# Patient Record
Sex: Female | Born: 1940 | Race: White | Hispanic: No | Marital: Married | Smoking: Former smoker
Health system: Southern US, Community
[De-identification: ages and names within clinical notes are randomized; demographics above are authoritative.]

## PROBLEM LIST (undated history)

## (undated) DIAGNOSIS — E079 Disorder of thyroid, unspecified: Secondary | ICD-10-CM

## (undated) DIAGNOSIS — M1990 Unspecified osteoarthritis, unspecified site: Secondary | ICD-10-CM

## (undated) DIAGNOSIS — Z800 Family history of malignant neoplasm of digestive organs: Secondary | ICD-10-CM

## (undated) DIAGNOSIS — Z931 Gastrostomy status: Secondary | ICD-10-CM

## (undated) DIAGNOSIS — R3915 Urgency of urination: Secondary | ICD-10-CM

## (undated) DIAGNOSIS — K2270 Barrett's esophagus without dysplasia: Secondary | ICD-10-CM

## (undated) DIAGNOSIS — C801 Malignant (primary) neoplasm, unspecified: Secondary | ICD-10-CM

## (undated) DIAGNOSIS — I10 Essential (primary) hypertension: Secondary | ICD-10-CM

## (undated) DIAGNOSIS — Z932 Ileostomy status: Secondary | ICD-10-CM

## (undated) DIAGNOSIS — N393 Stress incontinence (female) (male): Secondary | ICD-10-CM

## (undated) HISTORY — DX: Stress incontinence (female) (male): N39.3

## (undated) HISTORY — DX: Unspecified osteoarthritis, unspecified site: M19.90

## (undated) HISTORY — DX: Family history of malignant neoplasm of digestive organs: Z80.0

## (undated) HISTORY — DX: Barrett's esophagus without dysplasia: K22.70

## (undated) HISTORY — DX: Malignant (primary) neoplasm, unspecified: C80.1

## (undated) HISTORY — DX: Essential (primary) hypertension: I10

## (undated) HISTORY — DX: Disorder of thyroid, unspecified: E07.9

## (undated) HISTORY — PX: TOTAL KNEE ARTHROPLASTY: SHX125

## (undated) HISTORY — DX: Urgency of urination: R39.15

---
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
## 1997-02-22 DIAGNOSIS — C801 Malignant (primary) neoplasm, unspecified: Secondary | ICD-10-CM

## 1997-02-22 HISTORY — PX: BREAST LUMPECTOMY: SHX2

## 1997-02-22 HISTORY — DX: Malignant (primary) neoplasm, unspecified: C80.1

## 1997-04-24 ENCOUNTER — Ambulatory Visit (HOSPITAL_COMMUNITY): Admission: RE | Admit: 1997-04-24 | Discharge: 1997-04-24 | Payer: Self-pay | Admitting: Obstetrics & Gynecology

## 1997-06-27 ENCOUNTER — Other Ambulatory Visit: Admission: RE | Admit: 1997-06-27 | Discharge: 1997-06-27 | Payer: Self-pay | Admitting: Radiology

## 1997-07-25 ENCOUNTER — Ambulatory Visit (HOSPITAL_COMMUNITY): Admission: RE | Admit: 1997-07-25 | Discharge: 1997-07-25 | Payer: Self-pay | Admitting: *Deleted

## 1997-08-05 ENCOUNTER — Encounter: Admission: RE | Admit: 1997-08-05 | Discharge: 1997-11-03 | Payer: Self-pay | Admitting: Radiation Oncology

## 1997-08-08 ENCOUNTER — Ambulatory Visit (HOSPITAL_COMMUNITY): Admission: RE | Admit: 1997-08-08 | Discharge: 1997-08-08 | Payer: Self-pay | Admitting: *Deleted

## 1997-09-11 ENCOUNTER — Ambulatory Visit (HOSPITAL_COMMUNITY): Admission: RE | Admit: 1997-09-11 | Discharge: 1997-09-11 | Payer: Self-pay | Admitting: *Deleted

## 1997-11-04 ENCOUNTER — Ambulatory Visit (HOSPITAL_COMMUNITY): Admission: RE | Admit: 1997-11-04 | Discharge: 1997-11-04 | Payer: Self-pay | Admitting: *Deleted

## 1997-11-13 ENCOUNTER — Other Ambulatory Visit: Admission: RE | Admit: 1997-11-13 | Discharge: 1997-11-13 | Payer: Self-pay | Admitting: Gynecology

## 1998-02-04 ENCOUNTER — Encounter: Admission: RE | Admit: 1998-02-04 | Discharge: 1998-05-05 | Payer: Self-pay | Admitting: Radiation Oncology

## 1999-11-18 ENCOUNTER — Encounter: Payer: Self-pay | Admitting: Orthopedic Surgery

## 1999-11-26 ENCOUNTER — Inpatient Hospital Stay (HOSPITAL_COMMUNITY): Admission: RE | Admit: 1999-11-26 | Discharge: 1999-12-01 | Payer: Self-pay | Admitting: Orthopedic Surgery

## 2000-03-28 ENCOUNTER — Other Ambulatory Visit: Admission: RE | Admit: 2000-03-28 | Discharge: 2000-03-28 | Payer: Self-pay | Admitting: Gynecology

## 2000-04-08 ENCOUNTER — Encounter (INDEPENDENT_AMBULATORY_CARE_PROVIDER_SITE_OTHER): Payer: Self-pay

## 2000-04-08 ENCOUNTER — Other Ambulatory Visit: Admission: RE | Admit: 2000-04-08 | Discharge: 2000-04-08 | Payer: Self-pay | Admitting: Gynecology

## 2000-09-20 ENCOUNTER — Encounter: Admission: RE | Admit: 2000-09-20 | Discharge: 2000-09-20 | Payer: Self-pay | Admitting: Orthopedic Surgery

## 2000-09-20 ENCOUNTER — Encounter: Payer: Self-pay | Admitting: Orthopedic Surgery

## 2001-01-17 ENCOUNTER — Encounter: Admission: RE | Admit: 2001-01-17 | Discharge: 2001-02-14 | Payer: Self-pay | Admitting: Orthopedic Surgery

## 2001-03-30 ENCOUNTER — Other Ambulatory Visit: Admission: RE | Admit: 2001-03-30 | Discharge: 2001-03-30 | Payer: Self-pay | Admitting: Gynecology

## 2001-04-07 ENCOUNTER — Encounter: Payer: Self-pay | Admitting: Orthopedic Surgery

## 2001-04-17 ENCOUNTER — Inpatient Hospital Stay (HOSPITAL_COMMUNITY): Admission: RE | Admit: 2001-04-17 | Discharge: 2001-04-22 | Payer: Self-pay | Admitting: Orthopedic Surgery

## 2002-06-13 ENCOUNTER — Other Ambulatory Visit: Admission: RE | Admit: 2002-06-13 | Discharge: 2002-06-13 | Payer: Self-pay | Admitting: Gynecology

## 2003-06-24 ENCOUNTER — Other Ambulatory Visit: Admission: RE | Admit: 2003-06-24 | Discharge: 2003-06-24 | Payer: Self-pay | Admitting: Gynecology

## 2004-06-30 ENCOUNTER — Other Ambulatory Visit: Admission: RE | Admit: 2004-06-30 | Discharge: 2004-06-30 | Payer: Self-pay | Admitting: Gynecology

## 2005-08-18 ENCOUNTER — Other Ambulatory Visit: Admission: RE | Admit: 2005-08-18 | Discharge: 2005-08-18 | Payer: Self-pay | Admitting: Gynecology

## 2006-01-24 ENCOUNTER — Ambulatory Visit: Payer: Self-pay | Admitting: Gastroenterology

## 2006-02-07 ENCOUNTER — Ambulatory Visit: Payer: Self-pay | Admitting: Gastroenterology

## 2006-02-07 ENCOUNTER — Encounter (INDEPENDENT_AMBULATORY_CARE_PROVIDER_SITE_OTHER): Payer: Self-pay | Admitting: Specialist

## 2006-03-03 ENCOUNTER — Ambulatory Visit: Payer: Self-pay | Admitting: Gastroenterology

## 2006-11-07 ENCOUNTER — Other Ambulatory Visit: Admission: RE | Admit: 2006-11-07 | Discharge: 2006-11-07 | Payer: Self-pay | Admitting: Gynecology

## 2007-04-06 DIAGNOSIS — K219 Gastro-esophageal reflux disease without esophagitis: Secondary | ICD-10-CM | POA: Insufficient documentation

## 2007-04-06 DIAGNOSIS — I10 Essential (primary) hypertension: Secondary | ICD-10-CM | POA: Diagnosis present

## 2007-04-06 DIAGNOSIS — E0500 Thyrotoxicosis with diffuse goiter without thyrotoxic crisis or storm: Secondary | ICD-10-CM | POA: Insufficient documentation

## 2007-04-06 DIAGNOSIS — E785 Hyperlipidemia, unspecified: Secondary | ICD-10-CM | POA: Insufficient documentation

## 2007-04-06 DIAGNOSIS — F411 Generalized anxiety disorder: Secondary | ICD-10-CM | POA: Insufficient documentation

## 2010-06-23 ENCOUNTER — Encounter (INDEPENDENT_AMBULATORY_CARE_PROVIDER_SITE_OTHER): Payer: Self-pay | Admitting: General Surgery

## 2010-06-26 ENCOUNTER — Other Ambulatory Visit: Payer: Self-pay | Admitting: Family Medicine

## 2010-06-26 DIAGNOSIS — M542 Cervicalgia: Secondary | ICD-10-CM

## 2010-07-01 ENCOUNTER — Ambulatory Visit
Admission: RE | Admit: 2010-07-01 | Discharge: 2010-07-01 | Disposition: A | Discharge status: Home / Self Care | Payer: MEDICARE | Source: Ambulatory Visit | Attending: Family Medicine | Admitting: Family Medicine

## 2010-07-01 DIAGNOSIS — M542 Cervicalgia: Secondary | ICD-10-CM

## 2010-07-10 NOTE — Op Note (Signed)
[facility]  Patient:    Nancy Harrison, Nancy Harrison Visit Number: [id number] MRN: [id number]          Service Type: SUR Location: 4W [id number] 01 Attending Physician:  Marlowe Kays Page Dictated by:   Illene Labrador. Aplington, M.D. Proc. Date: 04/17/01 Admit Date:  04/17/2001                             Operative Report  PREOPERATIVE DIAGNOSIS:  Advanced osteoarthritis, right knee.  POSTOPERATIVE DIAGNOSIS:  Advanced osteoarthritis, right knee.  OPERATION PERFORMED:  Osteonics total knee replacement, right using the Scorpio cruciate retaining modality.  SURGEON:  Illene Labrador. Aplington, M.D.  ASSISTANT:  Philips J. Montez Morita, M.D.  ANESTHESIA:  Spinal.  PATHOLOGY AND JUSTIFICATION FOR PROCEDURE:  She had a successful total knee replacement on the left. She had severe medial compartment arthritis in the right knee with a very slight flexion contracture.  DESCRIPTION OF PROCEDURE:  Prophylactic antibiotics, satisfactory spinal anesthesia, Foley catheter inserted, pneumatic tourniquet, lateral hip positioner and sure foot. The right leg was prepped with Duraprep from tourniquet to ankle, draped in a sterile field. A vertical midline incision down to the patellar mechanism with median parapatellar incisions to open the joint. The patella was everted and the knee flexed. Osteophytes were removed from around the femur. The anterior cruciate was sacrificed and the anterior portions of the menisci were resected. I then made a drill hole in the distal femur 5/16 inch followed by the canal finder and then the aligning rod set for a distal femoral resection of 10 mm at 5 degrees for the right knee. The 10 mm bone was resected and we then sized the distal femur using the sizing jig at size 5 which is what we had used in the left knee. Scribe holes were placed on the distal femur and I then used the distal femoral cutting jig to make the dorsal and posterior cuts and then  posterior and anterior chamferings. I then went to the tibia where the remnants of menisci were removed and a leveling cut was made. The proximal tibia was sized at #5 and using the base plate the initial drill hole into the tibia was made followed by the step cut drill and the intermedullary rod. I then placed intermedullary rod in the tibia and then used the 5 degree posterior recessed cutting jig with a 4 mm cut off the recessed medial tibial plateau. Then my initial cuts were made and I then placed the lamina spreader and removed residual bone and soft tissue from the posterior femoral condyles. I then used the jig for creating the patellar groove. Following this, we placed a trial femoral component which fit intimately. Then we went through a trial reduction using a 10 mm spacer and this seemed to be the ideal size with minimal to no recurvatum and good medial and lateral stability. The knee in full extension, I used the external aligning rod splitting the bimalleolar distance to mark the scribe lines on the anterior tibial plateau using the tibial base plate. While the knee was extension, I then used the 10 mm recessed cutting jig to make a 10 mm recessed cut 26 mm and then placed the three hole drilling device and made the three fixation holes for the patella. The trial patella was then placed and excess bone and soft tissue around the perimeter was removed so that the patella was trialed to the surrounding tissue.  I then flexed the knee and placed the tibial base plate based on the previous fixation scribe lines and reamed with the tripod apparatus up to a 5 cemented. The knee was then water picked while the methyl methacrylate was being mixed. I then used a glue gun and individually glued in the three components starting first with the tibia with the base plate being tightly impacted and excess methyl methacrylate being removed. The same was done with the femur and then with the 10  mm spacer we held the knee in full extension while the patella was glued in using patella holding clamp. When the methyl methacrylate had hardened, we checked the knee and removed small bits of methacrylate until all the components appeared to be free of any debris or overlapping methacrylate. Gelfoam was placed in the distal femoral hole. The 10 mm spacer seemed to be the ideal fit so we went ahead and after irrigating the wound well making sure there is no methyl methacrylate in the tibial tray, we went ahead and placed the final 10 mm spacer. The knee was reduced and found to be nice and stable. A Hemovac was placed, tourniquet was released, no unusual bleeding. Closure was then performed with interrupted #1 Vicryl in two layers in the quadriceps and in one layer distally in the synovium, one layer of #1 Vicryl in the capsule and a combination of #0 and 2-0 Vicryl in the subcutaneous with staples in the skin. Betadine Adaptic dry sterile dressing were applied. She tolerated the procedure well and was taken to the recovery room in satisfactory condition with no known complications. Estimated blood loss was approximately 200 cc, no blood replacement. Dictated by:   Illene Labrador. Aplington, M.D. Attending Physician:  Joaquin Courts DD:  04/17/01 TD:  04/17/01 Job: [id number]

## 2010-07-10 NOTE — Op Note (Signed)
[facility]  Patient:    Nancy Harrison, Nancy Harrison                   MRN: [id number] Proc. Date: 11/26/99 Adm. Date:  [id number] Attending:  Marlowe Kays Page                           Operative Report  PREOPERATIVE DIAGNOSES:  Advanced osteoarthritis, left knee.  POSTOPERATIVE DIAGNOSES:  Advanced osteoarthritis, left knee.  OPERATION PERFORMED:  Osteonic total knee replacement, left.  SURGEON:  Dr. Simonne Come.  ASSISTANT:  Dr. Worthy Rancher.  ANESTHESIA:  Spinal.  PATHOLOGY AND JUSTIFICATION FOR PROCEDURE:  She had bone on bone abutment medially and advanced patellar chondromalacia as well. All other nonsurgical methods had failed.  DESCRIPTION OF PROCEDURE:  Prophylactic antibiotics, satisfactory spinal anesthesia, Foley catheter inserted, pneumatic tourniquet, ______ foot stabilizer and folded sheets beneath the left buttock. The left leg was prepped from tourniquet to ankle with duraprep, draped in a sterile field, Ioban employed. A vertical midline incision down to the patellar mechanism with median parapatellar incision to open the joint. Osteophytes around the femur and the patella were removed. Her patella was very small but on sizing, a 26 mm the smallest available did seem to be feasible and was ultimately used. The anterior cruciate ligament was sacrificed, the posterior cruciate ligament saved. The two menisci were freed up and resected in subtotal fashion. The pes anserinus and the medial collateral ligament were undermined from the medial tibia. A 5/16 inch drill hole was placed in the distal femur, followed by a canal finder and the axis liners at 5 degrees cut to the distal femur on the left knee. Because she had a small flexion contracture, I elected to take 12 mm off the distal femur and the 12 mm cut was made. I then placed the sizing jig and a 5 was the appropriate size. The scribe lines were placed for placing the distal femoral  cutting jig which was then applied and anterior and posterior cuts and posterior and anterior chamferings were then made. I then returned to the tibia where the remnants of the posterior menisci were removed and a leveling cut made on the tibia which was incised at a 5. The tibial trial tray was placed and a centering hole made, enlarged followed by the canal finder. I then placed the intermedullary rod with the proximal tibial cutting guide and aligned the cutting jig for a 5 degree posterior cut splitting the bimalleolar distance with the external rod. Initially I planned to take a 4 mm cut off using the medial tibial plateau but it was clear that this was not sufficient and ultimately took additional 2 for a total of a 6 mm cut off the proximal tibia. After this cut had been completed, we then elevated the femur up with a bone hook and removed some remnants of bone and soft tissue posteriorly. I then placed the patellar groove guide and created a groove for the patella. We then went through a trial reduction and anatomic fit on the femur and a 15 tray seemed to be the best fit. With the knee in extension, we went ahead and used the 10 mm recessed cutting jig for the patella followed by drilling 3 holes. The trial patella was then placed and excess bone around the perimeter was removed with the rongeur. We went through the trial having used the external alignment guide once again splitting  the bimalleolar distance and marking scribe lines on the tibia, we used the 5 tibial tray fixing it using the scribe lines and then placed the tripod apparatus for creating the keel for the tibial prosthesis. Reaming was made up to a 5 cemented. We then water picked the entire knee while the methyl methacrylate was being mixed. We then individually glued in the components starting first with the tibia impacting the tibial component and removing excess methyl methacrylate, the femur and then the patella.  The knee was held in extension while the glue hardened on all components. Excess methyl methacrylate was then trimmed up from around the components and trial reduction was again confirmed with the 15 mm spacer was the ideal fit. The final tray was placed then it taken through a full range of motion. Minimal patellar lateral release was performed with patella gliding nicely in the midline. The tourniquet was released and minimal bleeding coagulated. A Hemovac was placed and the wound closed with interrupted #1 Vicryl in 2 layers in the quadriceps and distally in the synovium and capsule. The subcutaneous tissue was closed with a combination of #1-0 and 2-0 Vicryl, the skin with staples. Betadine Adaptic dry sterile dressing were applied. The patient tolerated the procedure well and was taken to the recovery room in satisfactory condition with no known complications. Estimated blood loss was approximately 200 cc, no blood replacement. DD:  11/26/99 TD:  11/26/99 Job: [id number]

## 2010-07-10 NOTE — Discharge Summary (Signed)
[facility]  Patient:    Nancy Harrison, Nancy Harrison Visit Number: [id number] MRN: [id number]          Service Type: SUR Location: 4W 0470 01 Attending Physician:  Marlowe Kays Page Dictated by:   Sammuel Cooper. Mahar, P.A. Admit Date:  04/17/2001 Discharge Date: 04/22/2001                             Discharge Summary  DATE OF BIRTH:  1940-07-10.  ADMITTING DIAGNOSES:  1. End-stage osteoarthritis of the right knee.  2. Hypercholesterolemia.  3. Hypertension.  4. Gastroesophageal reflux disease.  5. Anxiety.  6. History of breast cancer.  7. Allergic sinusitis.  DISCHARGE DIAGNOSES:  1. Status post right total knee arthroplasty.  2. Postoperative hemorrhagic anemia that was mild and did not require a     blood transfusion.  3. Postoperative fever that resolved prior to discharge.  4. Hypokalemia.  5. Mild hyponatremia that resolved prior to discharge.  6. Hypercholesterolemia.  7. Hypertension.  8. Gastroesophageal reflux disease.  9. Anxiety. 10. History of breast cancer. 11. Allergic sinusitis.  PROCEDURE:  Right total knee arthroplasty, which was done by Illene Labrador. Aplington, M.D., with the assistance of Philips J. Montez Morita, M.D.  Anesthesia used was spinal.  CONSULTATIONS:  None.  HISTORY OF PRESENT ILLNESS:  The patient is a 70 year old female who has been followed by [facility] for some time with pain in bilateral knees.  She has undergone arthroscopic debridement of the right knee as well as she has tried other conservative measures without any success.  The risks and benefits of total knee replacement were discussed with the patient by Dr. Marlowe Kays.  She indicated an understanding and did wish to proceed.  LABORATORY DATA:  On April 06, 2001, CBC was within normal limits. Postoperatively, H&H was monitored, did reach a low of 11.4 and 33.2, respectively, on April 18, 2001.  She had gradual increase up to 12.0  and 34.4 on April 20, 2001.  CBC on April 20, 2001, was within normal limits with the exception of slightly decreased hemoglobin and hematocrit.  PT, INR, and PTT on April 06, 2001, within normal limits.  PT and INR were monitored postoperatively while on Coumadin, gradually increased up to a therapeutic range, reaching therapeutic range on April 20, 2001, of 27.6 and 3.5, respectively.  Prior to discharge on April 22, 2001, these were 21.5 and 2.3, respectively.  A complete metabolic panel from April 06, 2001, was within normal limits.  Basic metabolic panel from February 26 and February 27, on February 26 potassium was slightly decreased to 3.1 and increased up to 3.3 on February 27.  Sodium on February 27 was 133, later that day was repeated and increased up to 135.  Glucose was slightly elevated at 124 and 141 on February 26 and February 27.  UA from February 13 was negative with the exception of large leukocyte esterase and epithelials, few.  On February 27, UA was within normal limits with the exception of a small leukocyte esterase.  On April 17, 2001, blood typing was A, Rh type positive, antibody screen negative. Urine cultures from February 27 revealed a colony count of greater than or equal to 100,000.  Culture revealed multiple species present with no uropathogens isolated, probable contamination.  EKG from February 14 showed normal sinus rhythm, ST and T-wave abnormality, consider anterior ischemia, abnormal EKG, no significant changes since last tracing,  read by Rollene Rotunda, M.D.   X-rays from February 14 of the chest revealed no active disease.  Right knee showed moderate advanced degenerative changes.  HOSPITAL COURSE:  The patient was taken to the operating suite on April 17, 2001, for the above-listed procedure.  She tolerated the procedure very well without any intraoperative complications.  Postoperatively appropriate analgesics were utilized  initially using a combination of IV as well as p.o. analgesics transitioned over postop day 1 to 2 to strictly p.o. analgesics. Pain control remained adequate.  Neurovascular checks were started postoperatively and remained intact throughout her hospital stay.  Appropriate antibiotics were started postoperatively for 48 hours.  IV antibiotics were discontinued at that point.  She was transitioned out to p.o. Keflex, and this continued through discharge secondary to some serous drainage from the incision.  Diet was advanced as tolerated without any difficulty.  The incentive spirometer was utilized to decrease the risk of pulmonary complications.  She did very well without any complications.  PT and OT were consulted to work with the patient on progressive ambulation as well as total knee protocol.  She progressed well with them, increasing up to ambulating in excess of 100 feet without any difficulty or problems independently.  On postoperative day 1 the Hemovac drain that was placed intraoperatively was discontinued without any difficulty.  Postoperative day 2 the surgical dressing was taken and revealed a good-looking incision without any signs or symptoms of infection, did have some serous drainage.  The patient started running a low-grade temperature on postoperative day 2.  It was felt to be secondary to atelectasis.  We encouraged her to increase her use of the bedside incentive spirometer.  The low-grade temperature did continue through postop day 3 and 4; however, prior to discharge it did resolve.  UA was negative as well as white count was within normal limits throughout.  Daily dressing changes were done to the operative site, and the incision continued to look very good without any problems throughout.  By April 22, 2001, the patient had been doing very well with physical therapy and met all orthopedic goals.  She was medically stable and ready for discharge.  Her fever  had resolved.  Incision looked very good without any signs or symptoms of infection, very minimal serous drainage, no purulence present.  She was  neurovascularly intact.  Motor and sensory was intact to the operative extremity.  The patient was started on Coumadin.  PT and INR were monitored postoperatively while on Coumadin and managed per pharmacy.  DISCHARGE INSTRUCTIONS:  Activity:  Continue weightbearing as tolerated. Continue on total knee protocol and precautions.  Dressing changes should be done daily.  The patient may shower when the drainage from the incision stops. Discharge diet:  Resume a preoperative diet as tolerated.  FOLLOW-UP:  Follow up with Dr. Fayrene Fearing Aplington two weeks postoperatively. Call for an appointment.  DISCHARGE MEDICATIONS: 1. Coumadin 3 mg tablets one p.o. q.d., will be managed by [facility]. 2. Robaxin, Percocet, and Keflex x5 days.  DISCHARGE DISPOSITION:  Being discharged to her home with home health services per [place].  CONDITION ON DISCHARGE:  Stable and improved. Dictated by:   Sammuel Cooper. Mahar, P.A. Attending Physician:  Joaquin Courts DD:  05/17/01 TD:  05/18/01 Job: [id number]

## 2010-07-10 NOTE — Assessment & Plan Note (Signed)
[facility]                         GASTROENTEROLOGY OFFICE NOTE   Nancy Harrison, Nancy Harrison                   MRN:          [id number]  DATE:03/03/2006                            DOB:          [birth year]    Nancy Harrison comes for followup of her endoscopy and colonoscopy which  were done on February 07, 2006.  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  She really denies any chronic reflux symptoms except for p.r.n.  indigestion for which she uses Pepcid AC.   Her colonoscopy showed diverticulosis coli and I have explained the high-  fiber-diet management of diverticulosis.  She does have a family history  of colon cancer in mother at age 66.  We will put her on our colonoscopy  screening protocol for every 5 years.   She otherwise is followed by Dr. Idell Pickles, is on multiple medications  which I have asked her to continue.  We will see her on a p.r.n. basis  as needed.     Vania Rea. Jarold Motto, MD, Caleen Essex, FAGA  Electronically Signed    DRP/MedQ  DD: 03/03/2006  DT: 03/03/2006  Job #: [id number]   cc:   Dellis Anes. Idell Pickles, M.D.  Leatha Gilding. Mezer, M.D.

## 2010-07-10 NOTE — H&P (Signed)
[facility]  Patient:    Nancy Harrison, Nancy Harrison Visit Number: [id number] MRN: [id number]          Service Type: Attending:  Illene Labrador. Aplington, M.D. Dictated by:   Ralene Bathe, P.A. Adm. Date:  04/11/01                           History and Physical  CHIEF COMPLAINT:  Right knee pain.  HISTORY OF PRESENT ILLNESS:  Nancy Harrison is a 70 year old female who has been followed by [place] for some time with pain in bilateral knees.  She has undergone arthroscopy of her right knee as well as viscus supplementation to her left knee and subsequently went on to have a total knee replacement on the left. She has done well with the left knee and wishes to proceed with the right knee now.  X-rays have shown bone-on-bone deformity.  She continues to have significant pain, and her right knee is much more symptomatic.  At this time total knee arthroplasty is indicated.  The risks and benefits were discussed with the patient.  At this time due to her pain and deformity and disability from this, she wishes to proceed.  PAST MEDICAL HISTORY: 1. Hypercholesterolemia. 2. Hypertension. 3. GERD. 4. Anxiety. 5. History of breast cancer. 6. Allergic sinusitis.  PAST SURGICAL HISTORY: 1. Left total knee arthroplasty in October 2001. 2. Right lumpectomy. 3. Port-A-Cath x2. 4. Right knee arthroscopy. 5. Tonsillectomy and adenoidectomy.  MEDICATIONS: 1. Alprazolam 0.25 mg 1/2 tablet t.i.d. 2. Bellergal-S 1/2 tablet b.i.d. 3. Lipitor 40 mg q.h.s. 4. Naproxen 500 mg b.i.d., which she is stopping. 5. Niacin 500 mg b.i.d. 6. Zestril 10 mg q.d.  7. Ziac 10 mg q.d.  8. Evista 60 mg q.d.  9. Aspirin 81 mg q.d., which she is stopping. 10. Allegra-D b.i.d. 11. Prilosec 10 mg q.a.m.  ALLERGIES:  SPORANOX and THIAZIDE.  These cause itching and rash.  SOCIAL HISTORY:  She is married.  Lives in a one-story home with six steps into the usual entrance.  She went home after her knee  replacement last time without the need for inpatient rehabilitation.  She did not donate autologous blood.  FAMILY HISTORY:  Mother with colon cancer.  Grandmother with hypertension and heart disease.  REVIEW OF SYSTEMS:  The patient denies any recent fevers, chills, night sweats.  No bleeding tendencies.  CNS:  No headaches, seizures, paralysis. RESPIRATORY:  No shortness of breath, productive cough, hemoptysis. CARDIOVASCULAR:  No chest pain, angina, orthopnea.  GASTROINTESTINAL:  No nausea, vomiting, constipation, melena, bloody stools.  GENITOURINARY:  No dysuria, hematuria.  MUSCULOSKELETAL:  As pertinent to present illness.  PHYSICAL EXAMINATION:  GENERAL:  The patient is well-developed, well-nourished 70 year old female.  VITAL SIGNS:  Blood pressure 124/82.  HEENT:  Normocephalic.  Extraocular motions intact.  NECK:  Supple.  No lymphadenopathy.  No carotid bruits appreciated on exam.  CHEST:  Clear to auscultation bilaterally.  No rales, no rhonchi.  HEART:  Regular rate and rhythm.  No murmurs, gallops, rubs, heaves, or thrills.  ABDOMEN:  Positive bowel sounds.  Soft and nontender.  EXTREMITIES:  Positive distal pulses.  Neurovascularly intact to bilateral lower extremities.  No pitting edema is noted.  She has painful range of motion and pain along both joint lines and crepitus on range of motion on the right knee.  IMPRESSION: 1. End-stage osteoarthritis right knee. 2. Hypercholesterolemia. 3. Hypertension. 4. Gastroesophageal reflux disease. 5. Anxiety. 6.  History of breast cancer. 7. Allergic sinusitis.  PLAN:  The plan will be for right total knee arthroplasty.  Will use home health PT postoperatively. Dictated by:   Ralene Bathe, P.A. Attending:  Illene Labrador. Aplington, M.D. DD:  04/09/01 TD:  04/09/01 Job: [id number]

## 2010-07-10 NOTE — H&P (Signed)
[facility]  Patient:    Nancy Harrison, Nancy Harrison                         MRN: [id number] Adm. Date:  11/26/99 Attending:  Fayrene Fearing P. Aplington, M.D. Dictator:   Alexzandrew L. Julien Girt, P.A.-C. CC:         Gordy Savers, M.D. [facility]   History and Physical  DATE OF OFFICE VISIT/HISTORY AND PHYSICAL:  November 18, 1999  CHIEF COMPLAINT:  Left knee pain.  HISTORY OF PRESENT ILLNESS:  The patient is a 70 year old female who has been seen by Dr. Simonne Come for long standing history of left knee pain and arthritis.  She has been seen and treated conservatively for left knee pain for some time now.  The pain has been progressive to the point where she is now having pain at rest and also during inclement weather.  She has been taking Naprosyn in the past.  The knee pain has started to interfere with her daily activities and she is seeking surgical intervention at this time.  She is seen and evaluated.  X-rays show bone-on-bone abutment in the medial compartment of the left knee with about only 1 mm clearance on the right medial compartment.  Surgical benefits and risks were discussed with the patient and she has elected to proceed with surgery.  ALLERGIES:  SPORANOX causes a rash or itching, THIAZIDE causes rash and itching.  CURRENT MEDICATIONS:  1. Allegra D b.i.d. p.r.n.  2. Alprazolam 0.25 mg 1/2 tablet t.i.d. p.r.n.  3. Bellergal-S (spastrin) 1/2 to 1 tablet b.i.d.  4. Lipitor 40 mg p.o. q.d. in the evenings.  5. Naprosyn 500 mg b.i.d. (stop prior to surgery).  6. Niacin 500 mg b.i.d.  7. Prilosec 10 mg one p.o. q.a.m.  8. Zestril 5 mg one p.o. q.p.m.  9. Ziac 10/6.25 mg p.o. q.p.m. 10. Evista 60 mg one p.o. q.p.m. 11. Aspirin 81 mg daily (stop prior to surgery). 12. Erythromycin topical 2% solution apply to facial area daily. Current cancer medications include: 13. Cyclophosphamide (Cytoxan) every three weeks. 14. Methotrexate every three weeks. 15.  Fluorouracil (5-FU) every three weeks. 16. Vitamin C 500 mg p.o. q.d. 17. Selenium 50 mcg p.o. q.d. 18. Calcium 500+D p.o. q.d. 19. Centrum multivitamin p.o. q.d. 20. Vitamin E 400 i.u. p.o. q.d.  PAST MEDICAL HISTORY:   Hypertension, hypercholesterolemia, osteoarthritis, remote history of kidney infection, seasonal allergies, colon polyps, anxiety, breast CA and has recently undergone chemotherapy and radiation.  PAST SURGICAL HISTORY:  Tonsillectomy in 1959, left knee arthroscopy in 1998, right lumpectomy in 1999, right resection lumpectomy in 1999, several colonoscopies with polypectomy.  She also had a Port-A-Cath installed in 1999 and reinstalled again later that year, this was all for chemotherapy treatment.  SOCIAL HISTORY:  She is married and lives in a one-story home with five to six steps entering in the home.  She has approximately two to four drinks of alcohol daily.  Two pack per day smoking for 17 years, however, she quit 23 years ago.  She has three children.  FAMILY HISTORY:  Mother deceased age 34 with history of hypertension and cancer.  Fathers history is unknown.  REVIEW OF SYSTEMS:  GENERAL:  No fevers, chills or night sweats.  NEUROLOGIC: No seizures, syncope or paralysis.  RESPIRATORY:  No shortness of breath, productive cough or hemoptysis.  CARDIOVASCULAR:  No chest pain, angina or orthopnea.  GI:  No nausea, vomiting, diarrhea or constipation.  GU:  No dysuria, hematuria or discharge.  MUSCULOSKELETAL:  Pertinent to that of the left knee as found in the history of present illness.  PHYSICAL EXAMINATION:  VITAL SIGNS:  Pulse 86, respirations 12, blood pressure 112/72.  GENERAL:  The patient is a 70 year old white female, well-developed, well-nourished, slightly overweight.  She is alert, oriented and cooperative.  HEENT:  Normocephalic, atraumatic.  Pupils are round and reactive.  Oropharynx is clear.  The patient is noted to wear glasses.  NECK:   Supple.  CHEST:  Clear to auscultation.  HEART:  Regular rate and rhythm.  No murmurs.  S1 and S2 noted.  ABDOMEN:  Round, soft, abdomen nontender, bowel sounds are present.  RECTAL/BREASTS/GENITALIA:  Not done, not pertinent to present illness.  EXTREMITIES:  The patient does walk with somewhat of an antalgic gait.  Motor function is intact throughout the left lower extremity with sensation intact. Crepitus noted on passive range of motion.  IMPRESSION: 1. Osteoarthritis, left knee. 2. Hypertension. 3. Hypercholesterolemia. 4. Seasonal allergies. 5. Anxiety. 6. History of breast cancer.  PLAN:  The patient will be admitted to [facility] for left total knee replacement arthroplasty.  The patient has been preoperatively by Dr. Eleonore Chiquito who has cleared her for the up and coming surgery.  The surgery will be performed by Dr. Marlowe Kays.  The patient has donated two units of autologous blood in preparation for the surgery. DD:  12/03/99 TD:  12/03/99 Job: [id number]

## 2011-01-01 ENCOUNTER — Ambulatory Visit (INDEPENDENT_AMBULATORY_CARE_PROVIDER_SITE_OTHER): Payer: Medicare Other | Admitting: General Surgery

## 2011-01-01 ENCOUNTER — Encounter (INDEPENDENT_AMBULATORY_CARE_PROVIDER_SITE_OTHER): Payer: Self-pay | Admitting: General Surgery

## 2011-01-01 VITALS — BP 118/70 | HR 72 | Temp 97.8°F | Resp 12 | Ht 59.0 in | Wt 158.0 lb

## 2011-01-01 DIAGNOSIS — C50919 Malignant neoplasm of unspecified site of unspecified female breast: Secondary | ICD-10-CM | POA: Insufficient documentation

## 2011-01-01 HISTORY — DX: Malignant neoplasm of unspecified site of unspecified female breast: C50.919

## 2011-01-01 NOTE — Progress Notes (Signed)
Subjective:     Patient ID: Nancy Harrison, female   DOB: [birth year], 70 y.o.   MRN: [id number]  HPI The patient is a 70 year old white female who is now 13 years out from a right breast lumpectomy and negative sentinel node biopsy. She was treated with chemotherapy and radiation. She has been doing well. She has had no illnesses in the last year. She denies any breast pain or discharge from her nipple. She is due for her yearly mammogram this month with Dr. Isaiah Serge. She does note 30 pounds of weight loss in the last year. She has been dieting and exercising to try to do this.  Review of Systems  Constitutional: Negative.   HENT: Negative.   Eyes: Negative.   Respiratory: Negative.   Cardiovascular: Negative.   Gastrointestinal: Negative.   Genitourinary: Negative.   Musculoskeletal: Negative.   Skin: Negative.   Neurological: Negative.   Hematological: Negative.   Psychiatric/Behavioral: Negative.        Objective:   Physical Exam  Constitutional: She is oriented to person, place, and time. She appears well-developed and well-nourished.  HENT:  Head: Normocephalic and atraumatic.  Eyes: Conjunctivae and EOM are normal. Pupils are equal, round, and reactive to light.  Neck: Normal range of motion. Neck supple.  Cardiovascular: Normal rate and regular rhythm.   Pulmonary/Chest: Effort normal and breath sounds normal.       She has a little nodularity at the old surgical site in the upper outer right breast. Otherwise no palpable mass in either breast. No axillary supraclavicular or cervical lymphadenopathy  Abdominal: Soft. Bowel sounds are normal. She exhibits no mass. There is no tenderness.  Musculoskeletal: Normal range of motion.  Neurological: She is alert and oriented to person, place, and time.  Skin: Skin is warm and dry.  Psychiatric: She has a normal mood and affect. Her behavior is normal.       Assessment:     13 years status post right lumpectomy and negative  sentinel node biopsy    Plan:     At this point she will continue to do regular self exams. She is due for her next mammogram this month. We will plan to see her back in one year unless she needs [place] sooner.

## 2011-01-01 NOTE — Patient Instructions (Signed)
Continue regular self exams  

## 2011-02-25 ENCOUNTER — Encounter (INDEPENDENT_AMBULATORY_CARE_PROVIDER_SITE_OTHER): Payer: Self-pay | Admitting: General Surgery

## 2011-03-05 ENCOUNTER — Other Ambulatory Visit: Payer: Self-pay | Admitting: Neurosurgery

## 2011-03-05 DIAGNOSIS — M25512 Pain in left shoulder: Secondary | ICD-10-CM

## 2011-03-10 ENCOUNTER — Ambulatory Visit
Admission: RE | Admit: 2011-03-10 | Discharge: 2011-03-10 | Disposition: A | Discharge status: Home / Self Care | Payer: Self-pay | Source: Ambulatory Visit | Attending: Neurosurgery | Admitting: Neurosurgery

## 2011-03-10 DIAGNOSIS — M25512 Pain in left shoulder: Secondary | ICD-10-CM

## 2011-03-16 ENCOUNTER — Other Ambulatory Visit: Payer: Self-pay | Admitting: Family Medicine

## 2011-03-16 ENCOUNTER — Other Ambulatory Visit (HOSPITAL_COMMUNITY)
Admission: RE | Admit: 2011-03-16 | Discharge: 2011-03-16 | Disposition: A | Discharge status: Home / Self Care | Payer: Medicare Other | Source: Ambulatory Visit | Attending: Family Medicine | Admitting: Family Medicine

## 2011-03-16 DIAGNOSIS — Z1159 Encounter for screening for other viral diseases: Secondary | ICD-10-CM | POA: Insufficient documentation

## 2011-03-16 DIAGNOSIS — Z124 Encounter for screening for malignant neoplasm of cervix: Secondary | ICD-10-CM | POA: Insufficient documentation

## 2011-03-24 ENCOUNTER — Other Ambulatory Visit: Payer: Self-pay | Admitting: Oncology

## 2011-03-24 DIAGNOSIS — D649 Anemia, unspecified: Secondary | ICD-10-CM

## 2011-03-25 ENCOUNTER — Telehealth: Payer: Self-pay | Admitting: Oncology

## 2011-03-25 NOTE — Telephone Encounter (Signed)
lmonvm  With the pt advising her of the new pt appt  On 04/09/2011 and for her to call back to confirm.

## 2011-03-27 ENCOUNTER — Telehealth: Payer: Self-pay | Admitting: Oncology

## 2011-03-27 NOTE — Telephone Encounter (Signed)
S/w the pt and she is aware of her feb 2013 new pt appt.

## 2011-04-07 ENCOUNTER — Telehealth: Payer: Self-pay | Admitting: Oncology

## 2011-04-07 NOTE — Telephone Encounter (Signed)
Referred by Dr. Catha Gosselin Dx- Mylodyplastic Disorder

## 2011-04-09 ENCOUNTER — Ambulatory Visit (HOSPITAL_BASED_OUTPATIENT_CLINIC_OR_DEPARTMENT_OTHER): Payer: Medicare Other | Admitting: Oncology

## 2011-04-09 ENCOUNTER — Other Ambulatory Visit: Payer: Self-pay | Admitting: Oncology

## 2011-04-09 ENCOUNTER — Other Ambulatory Visit (HOSPITAL_BASED_OUTPATIENT_CLINIC_OR_DEPARTMENT_OTHER): Payer: Medicare Other | Admitting: Lab

## 2011-04-09 ENCOUNTER — Ambulatory Visit: Payer: Medicare Other

## 2011-04-09 VITALS — BP 119/76 | HR 85 | Temp 98.3°F | Ht 58.5 in | Wt 148.6 lb

## 2011-04-09 DIAGNOSIS — C50919 Malignant neoplasm of unspecified site of unspecified female breast: Secondary | ICD-10-CM

## 2011-04-09 DIAGNOSIS — D72819 Decreased white blood cell count, unspecified: Secondary | ICD-10-CM

## 2011-04-09 DIAGNOSIS — Z853 Personal history of malignant neoplasm of breast: Secondary | ICD-10-CM

## 2011-04-09 DIAGNOSIS — D649 Anemia, unspecified: Secondary | ICD-10-CM

## 2011-04-09 LAB — COMPREHENSIVE METABOLIC PANEL
ALT: 19 U/L (ref 0–35)
BUN: 10 mg/dL (ref 6–23)
CO2: 28 mEq/L (ref 19–32)
Calcium: 9.9 mg/dL (ref 8.4–10.5)
Chloride: 99 mEq/L (ref 96–112)
Creatinine, Ser: 0.61 mg/dL (ref 0.50–1.10)

## 2011-04-09 LAB — CBC & DIFF AND RETIC
BASO%: 0.8 % (ref 0.0–2.0)
EOS%: 2.9 % (ref 0.0–7.0)
LYMPH%: 36.3 % (ref 14.0–49.7)
MCH: 35.6 pg — ABNORMAL HIGH (ref 25.1–34.0)
MCHC: 34.2 g/dL (ref 31.5–36.0)
MONO#: 0.4 10*3/uL (ref 0.1–0.9)
Platelets: 182 10*3/uL (ref 145–400)
RBC: 3.99 10*6/uL (ref 3.70–5.45)
Retic %: 1.48 % (ref 0.70–2.10)
WBC: 3.8 10*3/uL — ABNORMAL LOW (ref 3.9–10.3)

## 2011-04-09 LAB — VITAMIN B12: Vitamin B-12: 480 pg/mL (ref 211–911)

## 2011-04-09 LAB — CHCC SMEAR

## 2011-04-09 NOTE — Progress Notes (Signed)
ID: Nancy Harrison   DOB: 04-15-1940  MR#: [id number]  NWG#:[id number]  HISTORY OF PRESENT ILLNESS: The patient is a 71 year old [place] woman I saw remotely for a history of breast cancer. (Last visit here was in 2004). More recently Dr. Clarene Harrison her primary care physician has become concerned because her white cell count has been slightly low of on repeat testing. He also noted a slight rise in her MCV. Given that she had chemotherapy in the past, he was concerned that she might be developing myelodysplastic disorder, and referred the patient for further evaluation.  Nancy Harrison breast cancer dates back to June of 1999, when she underwent right lumpectomy and sentinel lymph node biopsy for a T1CN0, stage IA invasive ductal carcinoma, estrogen and progesterone receptor negative, HER-2 negative, treated with cyclophosphamide/methotrexate/fluorouracil x8, followed by radiation.    PAST MEDICAL HISTORY: Past Medical History  Diagnosis Date  . Arthritis   . Cancer     rt breast  . Hyperlipidemia   . Hypertension    hypothyroidism  PAST SURGICAL HISTORY: Past Surgical History  Procedure Date  . Total knee arthroplasty 1999 & 2001  . Breast lumpectomy 1999  . Tonsillectomy 1957    FAMILY HISTORY Family History  Problem Relation Age of Onset  . Cancer Mother     COLON  . Cancer Sister     breast  . Cancer Maternal Aunt     breast   the patient knows little about her father. Her mother died from complications of colon cancer at the age of 31. The patient had no brothers. She has 1 half-sister, who was diagnosed with breast cancer in her mid 60s. There is no other history of breast or ovarian cancer in the family to her knowledge.  GYNECOLOGIC HISTORY: Menarche age 65, first pregnancy to term age 58, last menstrual period around the year 2000. She is GX P3  SOCIAL HISTORY: She is to work for an Radiation protection practitioner, but is now retired. At home she lives with her husband, also retired,  and her cat. She has 4 grandchildren, 3 of whom live nearby. She attends the Nancy Harrison    ADVANCED DIRECTIVES: not in place  HEALTH MAINTENANCE: History  Substance Use Topics  . Smoking status: Former Nancy Harrison  . Smokeless tobacco: Not on file  . Alcohol Use: Yes   she smoked up to 2 packs per day, for about 10 years, quitting at age 30. She tells me her next colonoscopy is just about due. Pap smears are up-to-date. Her cholesterol has been under treatment and is "fine". She had a bone density 2-3 years ago which was "normal". She drinks 3-4 glasses of wine every evening.    Allergies  Allergen Reactions  . Sporanox (Itraconazole)   . Thiazide-Type Diuretics     Current Outpatient Prescriptions  Medication Sig Dispense Refill  . ALPRAZolam (XANAX) 0.25 MG tablet Take 0.25 mg by mouth 3 (three) times daily. VERY OCCASIONALLY       . ascorbic Acid (VITAMIN C) 500 MG CPCR Take 500 mg by mouth daily.        Marland Kitchen aspirin 81 MG tablet Take 81 mg by mouth daily.        . bisoprolol-hydrochlorothiazide (ZIAC) 10-6.25 MG per tablet Take 1 tablet by mouth daily.        . calcium-vitamin D (OSCAL WITH D) 500-200 MG-UNIT per tablet Take 1 tablet by mouth daily.        Marland Kitchen lisinopril (PRINIVIL,ZESTRIL) 10  MG tablet Take 10 mg by mouth daily.        . Loratadine (CLARITIN PO) Take 1 tablet by mouth as needed.        . Multiple Vitamin (MULTIVITAMIN PO) Take by mouth daily.        . niacin (NIASPAN) 500 MG CR tablet Take 500 mg by mouth 2 (two) times daily.        . Omega-3 Fatty Acids (FISH OIL) 1200 MG CAPS Take by mouth 2 (two) times daily.        . Erythromycin 2 % ointment Apply topically daily.        . simvastatin (ZOCOR) 40 MG tablet Take 40 mg by mouth at bedtime.         REVIEW OF SYSTEMS: She had significant pain in the left shoulder, which lasted about 2 weeks. She saw urology in orthopedics for this. MRI showed at tendinopathy and a labral tear. The pain has essentially resolved. She did  receive some steroids for that. She has had a significant weight loss, which she attributes to her "no cards" diet and to lesser extent to regular exercise. She has had no fevers, rash, unexplained fatigue, drenching night sweats, or other systemic symptoms. She has fairly chronic low back pain which has not increased in intensity or frequency a detailed review of systems was otherwise noncontributory  OBJECTIVE: Middle-aged white woman who appears comfortable Filed Vitals:   04/09/11 1024  BP: 119/76  Pulse: 85  Temp: 98.3 F (36.8 C)     Body mass index is 30.53 kg/(m^2).    ECOG FS: 0  Sclerae unicteric Oropharynx clear No peripheral adenopathy Lungs no rales or rhonchi Heart regular rate and rhythm Abd benign MSK no focal spinal tenderness, no peripheral edema Neuro: nonfocal Breasts: The right breast status post remote lumpectomy. There are the expected distortions at the site of the biopsy, but no significant skin changes, nipple retraction, or suspicious masses; left breast was unremarkable  LAB RESULTS:   Labs today show a white cell count of 3.8 absolute neutrophil count of 1.9, hemoglobin 14.2, MCV 104, and platelet count 182,000. The reticulocyte count is 59.09. Review of the peripheral blood film shows no nucleated red cells, no immaturity in the white cell series, and no dysmorphic features in any of the 3 cell lines. Other labs are pending    Chemistry   No results found for this basename: NA,  K,  CL,  CO2,  BUN,  CREATININE,  GLU   No results found for this basename: CALCIUM,  ALKPHOS,  AST,  ALT,  BILITOT       No results found for this basename: INR:1;PROTIME:1 in the last 168 hours  No results found for this basename: UACOL:1,UAPR:1,USPG:1,UPH:1,UTP:1,UGL:1,UKET:1,UBIL:1,UHGB:1,UNIT:1,UROB:1,ULEU:1,UEPI:1,UWBC:1,URBC:1,UBAC:1,CAST:1,CRYS:1,UCOM:1,BILUA:1 in the last 72 hours   STUDIES: Mammography 02/02/2011 was unremarkable  03/11/2011  *RADIOLOGY REPORT*   Clinical Data: Left shoulder pain.  MRI OF THE LEFT SHOULDER WITHOUT CONTRAST  Technique:  Multiplanar, multisequence MR imaging was performed. No intravenous contrast was administered.  Comparison: None  Findings: No acute bony findings.  No marrow edema or bone contusions.  No osteochondral lesion.  The glenohumeral joint is maintained.  Minimal degenerative changes.  There are mild AC joint degenerative changes and moderate lateral downsloping of a type 3 acromion.  Moderate tendinopathy/tendinosis involving infraspinatus and supraspinatus tendons but no partial or full-thickness rotator cuff tear.  The subscapularis tendon is intact.  The long head biceps tendon is intact.  There is a  fairly extensive superior labral tear with a small paralabral cyst posteriorly.  Mild subacromial/subdeltoid bursitis.  IMPRESSION:  1.  Moderate rotator cuff tendinopathy/tendinosis but no partial or full-thickness rotator cuff tear. 2.  Extensive superior labral tear with a small posterior superior paralabral cyst. 3.  Intact long head biceps tendon. 4.  Lateral downsloping of a type 3 acromion may contribute to bony impingement.  Original Report Authenticated By: P. Loralie Champagne, M.D.    ASSESSMENT: 71 year old [place] woman  (1) status post right lumpectomy and sentinel lymph node sampling in June of 1999 for a T1 C., N0, stage I A. invasive ductal carcinoma, triple negative, treated adjuvantly with cyclophosphamide, methotrexate, and 5 fluorouracil for 8 cycles, followed by radiation. There is no evidence of disease recurrence  (2) now with a mild but persistent drop in her total white cell count and rise in her MCV, in the absence of other numeric or morphologic blood cell abnormalities  PLAN: The weight loss and shoulder pain were worrisome because they indeed could be signs of a systemic illness and more specifically of an acute leukemia, which can cause severe bony pain due to marrow expansion. However we do  have a good explanation for both of those findings, namely her diet and exercise program, and the tendinitis and other findings from the left shoulder MRI obtained recently. The review of the blood film is reassuring regarding the development of myelodysplasia: I really don't see any of the changes I would expect in that situation.  That leaves [place] with: why does she have a slightly low white cell count and a slightly high MCV? I think the explanation is going to be the amount of alcohol she consumes currently on a daily basis. Alcohol is a marrow suppressant. She likely has slightly less marrow reserve then a normal person, because of her remote chemotherapy. I think that would explain the slightly low white cell count. And alcohol changes the makeup of the phospholipids in red cell membranes, making the red cells "floppier" and slightly larger.  If this is the case, as I believe, then if she stopped alcohol for 3 months one would expect her blood findings to normalize  I discussed some strategies on how to decrease of alcohol intake and she is going to try to "water down" the daily wine ration with ice and seltzer water. I think if she gets her alcohol intake to less than 7 cups of wine a week, over the next few months her counts will go back to normal, and that would confirm my impression  I have not made her a return appointment here. Of course I will be glad to see her in the future on an as-needed basis   Amandalynn Pitz C    04/09/2011

## 2011-04-21 ENCOUNTER — Encounter: Payer: Self-pay | Admitting: Gastroenterology

## 2011-06-15 ENCOUNTER — Encounter: Payer: Self-pay | Admitting: Gastroenterology

## 2011-06-22 ENCOUNTER — Ambulatory Visit (AMBULATORY_SURGERY_CENTER): Payer: Medicare Other | Admitting: *Deleted

## 2011-06-22 ENCOUNTER — Encounter: Payer: Self-pay | Admitting: Gastroenterology

## 2011-06-22 VITALS — Ht 59.0 in | Wt 145.0 lb

## 2011-06-22 DIAGNOSIS — Z1211 Encounter for screening for malignant neoplasm of colon: Secondary | ICD-10-CM

## 2011-06-22 MED ORDER — PEG-KCL-NACL-NASULF-NA ASC-C 100 G PO SOLR: ORAL | Status: DC

## 2011-07-05 ENCOUNTER — Encounter: Payer: Self-pay | Admitting: Gastroenterology

## 2011-07-05 ENCOUNTER — Ambulatory Visit (AMBULATORY_SURGERY_CENTER): Payer: Medicare Other | Admitting: Gastroenterology

## 2011-07-05 VITALS — BP 153/116 | HR 99 | Temp 95.6°F | Resp 18 | Ht 59.0 in | Wt 146.4 lb

## 2011-07-05 DIAGNOSIS — K5730 Diverticulosis of large intestine without perforation or abscess without bleeding: Secondary | ICD-10-CM

## 2011-07-05 DIAGNOSIS — Z1211 Encounter for screening for malignant neoplasm of colon: Secondary | ICD-10-CM

## 2011-07-05 DIAGNOSIS — D126 Benign neoplasm of colon, unspecified: Secondary | ICD-10-CM

## 2011-07-05 MED ORDER — SODIUM CHLORIDE 0.9 % IV SOLN: 500.0000 mL | INTRAVENOUS | Status: DC

## 2011-07-05 NOTE — Progress Notes (Signed)
Patient did not experience any of the following events: a burn prior to discharge; a fall within the facility; wrong site/side/patient/procedure/implant event; or a hospital transfer or hospital admission upon discharge from the facility. (G8907) Patient did not have preoperative order for IV antibiotic SSI prophylaxis. (G8918)  

## 2011-07-05 NOTE — Progress Notes (Signed)
Pressure applied to the abdomen to reach the cecum 

## 2011-07-05 NOTE — Op Note (Signed)
[facility] [address] [place], [place]  [address]  COLONOSCOPY PROCEDURE REPORT  PATIENT:  Nancy Harrison, Nancy Harrison  MR#:  [id number] BIRTHDATE:  1941/01/08, 70 yrs. old  GENDER:  female ENDOSCOPIST:  Vania Rea. Jarold Motto, MD, [facility] REF. BY: PROCEDURE DATE:  07/05/2011 PROCEDURE:  Colonoscopy with snare polypectomy ASA CLASS:  Class II INDICATIONS:  history of pre-cancerous (adenomatous) colon polyps  MEDICATIONS:   propofol (Diprivan) 330 mg  DESCRIPTION OF PROCEDURE:   After the risks and benefits and of the procedure were explained, informed consent was obtained. Digital rectal exam was performed and revealed no abnormalities. The LB [id number] endoscope was introduced through the anus and advanced to the cecum, which was identified by both the appendix and ileocecal valve.  The quality of the prep was adequate, using MoviPrep.  The instrument was then slowly withdrawn as the colon was fully examined. <<PROCEDUREIMAGES>>  FINDINGS:  Moderate diverticulosis was found in the sigmoid to descending colon segments.  A sessile polyp was found. 5 mm cecal polyp hot snare removed.  This was otherwise a normal examination of the colon.   Retroflexed views in the rectum revealed no abnormalities.    The scope was then withdrawn from the patient and the procedure completed.  COMPLICATIONS:  None ENDOSCOPIC IMPRESSION: 1) Moderate diverticulosis in the sigmoid to descending colon segments 2) Sessile polyp 3) Otherwise normal examination RECOMMENDATIONS: 1) Await pathology results 2) Repeat colonoscopy in 5 years if polyp adenomatous; otherwise 10 years 3) High fiber diet.  REPEAT EXAM:  No  ______________________________ Vania Rea. Jarold Motto, MD, [facility]  CC:  Catha Gosselin, MD  n. Rosalie DoctorMarland Kitchen   Vania Rea. Carinne Brandenburger at 07/05/2011 01:58 PM  Velna Ochs, [id number]

## 2011-07-05 NOTE — Patient Instructions (Signed)
YOU HAD AN ENDOSCOPIC PROCEDURE TODAY AT THE [facility]: Refer to the procedure report that was given to you for any specific questions about what was found during the examination.  If the procedure report does not answer your questions, please call your gastroenterologist to clarify.  If you requested that your care partner not be given the details of your procedure findings, then the procedure report has been included in a sealed envelope for you to review at your convenience later.  YOU SHOULD EXPECT: Some feelings of bloating in the abdomen. Passage of more gas than usual.  Walking can help get rid of the air that was put into your GI tract during the procedure and reduce the bloating. If you had a lower endoscopy (such as a colonoscopy or flexible sigmoidoscopy) you may notice spotting of blood in your stool or on the toilet paper. If you underwent a bowel prep for your procedure, then you may not have a normal bowel movement for a few days.  DIET: Your first meal following the procedure should be a light meal and then it is ok to progress to your normal diet.  A half-sandwich or bowl of soup is an example of a good first meal.  Heavy or fried foods are harder to digest and may make you feel nauseous or bloated.  Likewise meals heavy in dairy and vegetables can cause extra gas to form and this can also increase the bloating.  Drink plenty of fluids but you should avoid alcoholic beverages for 24 hours.  ACTIVITY: Your care partner should take you home directly after the procedure.  You should plan to take it easy, moving slowly for the rest of the day.  You can resume normal activity the day after the procedure however you should NOT DRIVE or use heavy machinery for 24 hours (because of the sedation medicines used during the test).    SYMPTOMS TO REPORT IMMEDIATELY: A gastroenterologist can be reached at any hour.  During normal business hours, 8:30 AM to 5:00 PM Monday through Friday,  call [phone].  After hours and on weekends, please call the GI answering service at [phone] who will take a message and have the physician on call contact you.   Following lower endoscopy (colonoscopy or flexible sigmoidoscopy):  Excessive amounts of blood in the stool  Significant tenderness or worsening of abdominal pains  Swelling of the abdomen that is new, acute  Fever of 100F or higher  Following upper endoscopy (EGD)  Vomiting of blood or coffee ground material  New chest pain or pain under the shoulder blades  Painful or persistently difficult swallowing  New shortness of breath  Fever of 100F or higher  Black, tarry-looking stools  FOLLOW UP: If any biopsies were taken you will be contacted by phone or by letter within the next 1-3 weeks.  Call your gastroenterologist if you have not heard about the biopsies in 3 weeks.  Our staff will call the home number listed on your records the next business day following your procedure to check on you and address any questions or concerns that you may have at that time regarding the information given to you following your procedure. This is a courtesy call and so if there is no answer at the home number and we have not heard from you through the emergency physician on call, we will assume that you have returned to your regular daily activities without incident.  SIGNATURES/CONFIDENTIALITY: You and/or your care   partner have signed paperwork which will be entered into your electronic medical record.  These signatures attest to the fact that that the information above on your After Visit Summary has been reviewed and is understood.  Full responsibility of the confidentiality of this discharge information lies with you and/or your care-partner.   HANDOUTS ON POLYPS, DIVERTICULOSIS AND HIGH FIBER DIET 

## 2011-07-06 ENCOUNTER — Telehealth: Payer: Self-pay | Admitting: *Deleted

## 2011-07-06 NOTE — Telephone Encounter (Signed)
  Follow up Call-  Call back number 07/05/2011  Post procedure Call Back phone  # [phone]  Permission to leave phone message Yes     Patient questions:  Do you have a fever, pain , or abdominal swelling? no Pain Score  0 *  Have you tolerated food without any problems? yes  Have you been able to return to your normal activities? yes  Do you have any questions about your discharge instructions: Diet   no Medications  no Follow up visit  no  Do you have questions or concerns about your Care? no  Actions: * If pain score is 4 or above: No action needed, pain <4.

## 2011-07-12 ENCOUNTER — Encounter: Payer: Self-pay | Admitting: Gastroenterology

## 2011-12-30 ENCOUNTER — Ambulatory Visit (INDEPENDENT_AMBULATORY_CARE_PROVIDER_SITE_OTHER): Payer: Medicare Other | Admitting: General Surgery

## 2011-12-30 ENCOUNTER — Encounter (INDEPENDENT_AMBULATORY_CARE_PROVIDER_SITE_OTHER): Payer: Self-pay | Admitting: General Surgery

## 2011-12-30 VITALS — BP 116/68 | HR 80 | Temp 97.0°F | Resp 20 | Ht 59.0 in | Wt 149.6 lb

## 2011-12-30 DIAGNOSIS — Z853 Personal history of malignant neoplasm of breast: Secondary | ICD-10-CM

## 2011-12-30 NOTE — Patient Instructions (Signed)
Continue regular self exams  

## 2012-03-02 NOTE — Progress Notes (Signed)
Subjective:     Patient ID: Nancy Harrison, female   DOB: 1941/01/21, 72 y.o.   MRN: [id number]  HPI The patient is a 72 year old female who is 14 years status post right lumpectomy and sentinel node biopsy for a right breast cancer. She was treated with chemotherapy and radiation therapy. She has been doing well and has no complaints today. She denies any breast pain or discharge from her nipple. Since her last visit she has had cataract surgery. She is scheduled for her next mammogram next month  Review of Systems  Constitutional: Negative.   HENT: Negative.   Eyes: Negative.   Respiratory: Negative.   Cardiovascular: Negative.   Genitourinary: Negative.   Musculoskeletal: Negative.   Skin: Negative.   Neurological: Negative.   Hematological: Negative.   Psychiatric/Behavioral: Negative.        Objective:   Physical Exam  Constitutional: She is oriented to person, place, and time. She appears well-developed and well-nourished.  HENT:  Head: Normocephalic and atraumatic.  Eyes: Conjunctivae normal and EOM are normal. Pupils are equal, round, and reactive to light.  Neck: Normal range of motion. Neck supple.  Cardiovascular: Normal rate, regular rhythm and normal heart sounds.   Pulmonary/Chest: Effort normal and breath sounds normal.       She continues to have some slight nodularity at the right breast incision. There is no palpable mass in either breast. There is no palpable axillary or supraclavicular cervical lymphadenopathy.  Abdominal: Soft. Bowel sounds are normal. She exhibits no mass. There is no tenderness.  Musculoskeletal: Normal range of motion.  Lymphadenopathy:    She has no cervical adenopathy.  Neurological: She is alert and oriented to person, place, and time.  Skin: Skin is warm and dry.  Psychiatric: She has a normal mood and affect. Her behavior is normal.       Assessment:     The patient is 14 years status post right lumpectomy for breast cancer   Plan:     At this point she will continue to do regular self exams. We will followup on her mammogram next month. We will plan to see her back in one year.

## 2012-12-08 ENCOUNTER — Encounter (INDEPENDENT_AMBULATORY_CARE_PROVIDER_SITE_OTHER): Payer: Self-pay | Admitting: General Surgery

## 2013-01-17 ENCOUNTER — Ambulatory Visit (INDEPENDENT_AMBULATORY_CARE_PROVIDER_SITE_OTHER): Payer: Medicare Other | Admitting: General Surgery

## 2013-01-17 ENCOUNTER — Telehealth: Payer: Self-pay | Admitting: *Deleted

## 2013-01-17 ENCOUNTER — Encounter (INDEPENDENT_AMBULATORY_CARE_PROVIDER_SITE_OTHER): Payer: Self-pay | Admitting: General Surgery

## 2013-01-17 VITALS — BP 142/90 | HR 84 | Temp 98.2°F | Resp 15 | Ht 59.0 in | Wt 153.8 lb

## 2013-01-17 DIAGNOSIS — Z853 Personal history of malignant neoplasm of breast: Secondary | ICD-10-CM

## 2013-01-17 NOTE — Patient Instructions (Signed)
Continue regular self exams  

## 2013-01-17 NOTE — Progress Notes (Signed)
Subjective:     Patient ID: Nancy Harrison, female   DOB: 1940/05/28, 72 y.o.   MRN: [id number]  HPI The patient is a 72-year-old white female who is 15 years status post right lumpectomy for breast cancer. She was treated with chemotherapy and radiation therapy. She has had no major medical problems over the last year. She denies any breast or chest wall pain. Her appetite is good and her bowels are working normally. She is scheduled for her next mammogram next month.  Review of Systems  Constitutional: Negative.   HENT: Negative.   Eyes: Negative.   Respiratory: Negative.   Cardiovascular: Negative.   Gastrointestinal: Negative.   Endocrine: Negative.   Genitourinary: Negative.   Musculoskeletal: Negative.   Skin: Negative.   Allergic/Immunologic: Negative.   Neurological: Negative.   Hematological: Negative.   Psychiatric/Behavioral: Negative.        Objective:   Physical Exam  Constitutional: She is oriented to person, place, and time. She appears well-developed and well-nourished.  HENT:  Head: Normocephalic and atraumatic.  Eyes: Conjunctivae and EOM are normal. Pupils are equal, round, and reactive to light.  Neck: Normal range of motion. Neck supple.  Cardiovascular: Normal rate, regular rhythm and normal heart sounds.   Pulmonary/Chest: Effort normal and breath sounds normal.  She has some mild thickness to her previous lumpectomy scar probably secondary to radiation. Otherwise there is no palpable mass in either breast. There is no palpable axillary, supraclavicular, or cervical lymphadenopathy  Abdominal: Soft. Bowel sounds are normal. She exhibits no mass. There is no tenderness.  Musculoskeletal: Normal range of motion.  Lymphadenopathy:    She has no cervical adenopathy.  Neurological: She is alert and oriented to person, place, and time.  Skin: Skin is warm and dry.  Psychiatric: She has a normal mood and affect. Her behavior is normal.       Assessment:     The patient is 15 years status post right lumpectomy for breast cancer     Plan:     At this point she will continue to do regular self exams. She will get her mammogram done in December. We will plan to see her back in one year as long as her mammogram is negative
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
## 2013-02-28 ENCOUNTER — Encounter (INDEPENDENT_AMBULATORY_CARE_PROVIDER_SITE_OTHER): Payer: Self-pay

## 2013-06-21 ENCOUNTER — Encounter (HOSPITAL_COMMUNITY): Payer: Self-pay | Admitting: Emergency Medicine

## 2013-06-21 ENCOUNTER — Emergency Department (HOSPITAL_COMMUNITY): Payer: Medicare Other

## 2013-06-21 ENCOUNTER — Emergency Department (HOSPITAL_COMMUNITY)
Admission: EM | Admit: 2013-06-21 | Discharge: 2013-06-21 | Disposition: A | Discharge status: Home / Self Care | Payer: Medicare Other | Attending: Emergency Medicine | Admitting: Emergency Medicine

## 2013-06-21 DIAGNOSIS — S20219A Contusion of unspecified front wall of thorax, initial encounter: Secondary | ICD-10-CM | POA: Insufficient documentation

## 2013-06-21 DIAGNOSIS — Y92410 Unspecified street and highway as the place of occurrence of the external cause: Secondary | ICD-10-CM | POA: Insufficient documentation

## 2013-06-21 DIAGNOSIS — I10 Essential (primary) hypertension: Secondary | ICD-10-CM | POA: Insufficient documentation

## 2013-06-21 DIAGNOSIS — F411 Generalized anxiety disorder: Secondary | ICD-10-CM | POA: Insufficient documentation

## 2013-06-21 DIAGNOSIS — Z23 Encounter for immunization: Secondary | ICD-10-CM | POA: Insufficient documentation

## 2013-06-21 DIAGNOSIS — S199XXA Unspecified injury of neck, initial encounter: Secondary | ICD-10-CM

## 2013-06-21 DIAGNOSIS — Z87891 Personal history of nicotine dependence: Secondary | ICD-10-CM | POA: Insufficient documentation

## 2013-06-21 DIAGNOSIS — S0993XA Unspecified injury of face, initial encounter: Secondary | ICD-10-CM | POA: Insufficient documentation

## 2013-06-21 DIAGNOSIS — Z7982 Long term (current) use of aspirin: Secondary | ICD-10-CM | POA: Insufficient documentation

## 2013-06-21 DIAGNOSIS — H269 Unspecified cataract: Secondary | ICD-10-CM | POA: Insufficient documentation

## 2013-06-21 DIAGNOSIS — M129 Arthropathy, unspecified: Secondary | ICD-10-CM | POA: Insufficient documentation

## 2013-06-21 DIAGNOSIS — Z791 Long term (current) use of non-steroidal anti-inflammatories (NSAID): Secondary | ICD-10-CM | POA: Insufficient documentation

## 2013-06-21 DIAGNOSIS — T148XXA Other injury of unspecified body region, initial encounter: Secondary | ICD-10-CM | POA: Insufficient documentation

## 2013-06-21 DIAGNOSIS — Z79899 Other long term (current) drug therapy: Secondary | ICD-10-CM | POA: Insufficient documentation

## 2013-06-21 DIAGNOSIS — Y9389 Activity, other specified: Secondary | ICD-10-CM | POA: Insufficient documentation

## 2013-06-21 DIAGNOSIS — Z853 Personal history of malignant neoplasm of breast: Secondary | ICD-10-CM | POA: Insufficient documentation

## 2013-06-21 DIAGNOSIS — IMO0002 Reserved for concepts with insufficient information to code with codable children: Secondary | ICD-10-CM | POA: Insufficient documentation

## 2013-06-21 DIAGNOSIS — S5000XA Contusion of unspecified elbow, initial encounter: Secondary | ICD-10-CM | POA: Insufficient documentation

## 2013-06-21 LAB — I-STAT CHEM 8, ED
BUN: 18 mg/dL (ref 6–23)
CALCIUM ION: 1.2 mmol/L (ref 1.13–1.30)
Chloride: 101 mEq/L (ref 96–112)
Creatinine, Ser: 0.7 mg/dL (ref 0.50–1.10)
GLUCOSE: 106 mg/dL — AB (ref 70–99)
HEMATOCRIT: 50 % — AB (ref 36.0–46.0)
Hemoglobin: 17 g/dL — ABNORMAL HIGH (ref 12.0–15.0)
Potassium: 3.8 mEq/L (ref 3.7–5.3)
Sodium: 143 mEq/L (ref 137–147)
TCO2: 25 mmol/L (ref 0–100)

## 2013-06-21 MED ORDER — DIAZEPAM 5 MG PO TABS: 5.0000 mg | ORAL_TABLET | Freq: Two times a day (BID) | ORAL | Status: DC

## 2013-06-21 MED ORDER — HYDROCODONE-ACETAMINOPHEN 5-325 MG PO TABS: 1.0000 | ORAL_TABLET | ORAL | Status: DC | PRN

## 2013-06-21 MED ORDER — TETANUS-DIPHTH-ACELL PERTUSSIS 5-2.5-18.5 LF-MCG/0.5 IM SUSP
0.5000 mL | Freq: Once | INTRAMUSCULAR | Status: AC
  Administered 2013-06-21: 0.5 mL via INTRAMUSCULAR
  Filled 2013-06-21: qty 0.5
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
## 2013-07-05 ENCOUNTER — Telehealth (INDEPENDENT_AMBULATORY_CARE_PROVIDER_SITE_OTHER): Payer: Self-pay
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
## 2013-07-19 ENCOUNTER — Encounter (INDEPENDENT_AMBULATORY_CARE_PROVIDER_SITE_OTHER): Payer: Self-pay

## 2013-08-03 ENCOUNTER — Ambulatory Visit (INDEPENDENT_AMBULATORY_CARE_PROVIDER_SITE_OTHER): Payer: Medicare Other | Admitting: General Surgery

## 2013-08-03 ENCOUNTER — Encounter (INDEPENDENT_AMBULATORY_CARE_PROVIDER_SITE_OTHER): Payer: Self-pay | Admitting: General Surgery

## 2013-08-03 VITALS — BP 142/86 | HR 64 | Temp 97.1°F | Resp 14 | Ht 59.0 in | Wt 156.2 lb

## 2013-08-03 DIAGNOSIS — Z853 Personal history of malignant neoplasm of breast: Secondary | ICD-10-CM

## 2013-08-03 NOTE — Patient Instructions (Signed)
Continue regular self exams  
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
## 2013-09-25 ENCOUNTER — Other Ambulatory Visit: Payer: Self-pay | Admitting: Obstetrics and Gynecology

## 2013-09-25 ENCOUNTER — Other Ambulatory Visit (HOSPITAL_COMMUNITY)
Admission: RE | Admit: 2013-09-25 | Discharge: 2013-09-25 | Disposition: A | Discharge status: Home / Self Care | Payer: Medicare Other | Source: Ambulatory Visit | Attending: Obstetrics and Gynecology | Admitting: Obstetrics and Gynecology

## 2013-09-25 DIAGNOSIS — Z01419 Encounter for gynecological examination (general) (routine) without abnormal findings: Secondary | ICD-10-CM | POA: Diagnosis present

## 2013-09-25 DIAGNOSIS — Z113 Encounter for screening for infections with a predominantly sexual mode of transmission: Secondary | ICD-10-CM | POA: Diagnosis present

## 2013-10-10 LAB — CYTOLOGY - PAP

## 2014-12-03 ENCOUNTER — Encounter: Payer: Self-pay | Admitting: Gastroenterology

## 2016-03-16 DIAGNOSIS — Z0100 Encounter for examination of eyes and vision without abnormal findings: Secondary | ICD-10-CM | POA: Diagnosis not present

## 2016-03-16 DIAGNOSIS — H43391 Other vitreous opacities, right eye: Secondary | ICD-10-CM | POA: Diagnosis not present

## 2016-03-16 DIAGNOSIS — H52223 Regular astigmatism, bilateral: Secondary | ICD-10-CM | POA: Diagnosis not present

## 2016-03-16 DIAGNOSIS — I10 Essential (primary) hypertension: Secondary | ICD-10-CM | POA: Diagnosis not present

## 2016-03-16 DIAGNOSIS — Z961 Presence of intraocular lens: Secondary | ICD-10-CM | POA: Diagnosis not present

## 2016-03-16 DIAGNOSIS — H5202 Hypermetropia, left eye: Secondary | ICD-10-CM | POA: Diagnosis not present

## 2016-03-16 DIAGNOSIS — H524 Presbyopia: Secondary | ICD-10-CM | POA: Diagnosis not present

## 2016-03-19 DIAGNOSIS — Z1231 Encounter for screening mammogram for malignant neoplasm of breast: Secondary | ICD-10-CM | POA: Diagnosis not present

## 2016-03-19 DIAGNOSIS — Z853 Personal history of malignant neoplasm of breast: Secondary | ICD-10-CM | POA: Diagnosis not present

## 2016-03-19 DIAGNOSIS — M8588 Other specified disorders of bone density and structure, other site: Secondary | ICD-10-CM | POA: Diagnosis not present
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
## 2016-05-20 ENCOUNTER — Encounter: Payer: Self-pay | Admitting: Gastroenterology

## 2016-06-23 ENCOUNTER — Ambulatory Visit: Payer: Medicare Other | Admitting: Rehabilitative and Restorative Service Providers"

## 2016-06-24 DIAGNOSIS — R2689 Other abnormalities of gait and mobility: Secondary | ICD-10-CM | POA: Diagnosis not present

## 2016-07-01 ENCOUNTER — Encounter: Payer: Self-pay | Admitting: Rehabilitative and Restorative Service Providers"

## 2016-07-01 ENCOUNTER — Ambulatory Visit (INDEPENDENT_AMBULATORY_CARE_PROVIDER_SITE_OTHER): Payer: Medicare HMO | Admitting: Rehabilitative and Restorative Service Providers"

## 2016-07-01 DIAGNOSIS — M545 Low back pain, unspecified: Secondary | ICD-10-CM

## 2016-07-01 DIAGNOSIS — R2681 Unsteadiness on feet: Secondary | ICD-10-CM

## 2016-07-01 DIAGNOSIS — R531 Weakness: Secondary | ICD-10-CM | POA: Diagnosis not present

## 2016-07-01 NOTE — Therapy (Addendum)
[facility] [place] [address] [place], [place], [address] Phone: [phone]   Fax:  [phone]  Physical Therapy Evaluation  Patient Details  Name: Nancy Harrison MRN: [id number] Date of Birth: [birth year] Referring Provider: Dr Catha Gosselin   Encounter Date: 07/01/2016      PT End of Session - 07/01/16 1101    Visit Number 1   Number of Visits 12   Date for PT Re-Evaluation 08/12/16   PT Start Time 1014   PT Stop Time 1113   PT Time Calculation (min) 59 min   Activity Tolerance Patient tolerated treatment well      Past Medical History:  Diagnosis Date  . Arthritis   . 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 Cataract   . Hypertension     Past Surgical History:  Procedure Laterality Date  . BREAST LUMPECTOMY  1999   Chemo and radiation on right breast  . TONSILLECTOMY  1957  . 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       Subjective Assessment - 07/01/16 1013    Subjective Patient reports that she has fallen several times this year, Most recent fall was about 06/15/16 while she was bringing in the garbage cans - some injury to LB at that time. She is not sure what has caused the falls. She notes difficulty with balance for the past 5-6 months.    Pertinent History bilat TKA 2000; arthritis    How long can you sit comfortably? no limit   How long can you stand comfortably? 10 min then has back pain    How long can you walk comfortably? 30 min    Diagnostic tests none   Patient Stated Goals to be able to bring in the garbage cans without falling; feel more confident with going up and down stairs; stop falling    Currently in Pain? Yes   Pain Score 5    Pain Location Back   Pain Orientation Lower;Right;Left   Pain Descriptors / Indicators Tightness;Sharp   Pain Type Acute pain   Pain Onset 1 to 4 weeks ago   Pain Frequency Intermittent   Aggravating Factors   changing positions; standing up    Pain Relieving Factors OTC antiinflammatory; not moving             [facility] PT Assessment - 07/01/16 0001      Assessment   Medical Diagnosis Balance deficits   Referring Provider Dr Catha Gosselin    Onset Date/Surgical Date 06/15/16   Hand Dominance Right   Next MD Visit PRN   Prior Therapy after TKA ~2000     Precautions   Precautions None     Balance Screen   Has the patient fallen in the past 6 months Yes   How many times? 3   Has the patient had a decrease in activity level because of a fear of falling?  No   Is the patient reluctant to leave their home because of a fear of falling?  Yes  a little bit due to fear of falling      Home Environment   Additional Comments multilevel home (basement) 6 steps into the house with railing both sides      Prior Function   Level of Independence Independent   Vocation Retired   [employer] inside claims adjustor retired ~11 yrs ago    Leisure video  games; household chores; dog; church activities      Sensation   Additional Comments WNL's per pt report      Posture/Postural Control   Posture Comments head forward; shoulders rounded and elevated; slightly flexed forward at hips; Rt hemipelvis elevated compared to Lt      AROM   Right/Left Hip --  tight end ranges extension and rotation biltat    Right/Left Knee --  WFL's bilat    Lumbar Flexion 80%   Lumbar Extension 50%   Lumbar - Right Side Bend 65%   Lumbar - Left Side Bend 65%   Lumbar - Right Rotation 35%   Lumbar - Left Rotation 40%     Strength   Right Hip Flexion 4+/5   Right Hip Extension 4/5   Right Hip ABduction 4/5   Left Hip Flexion 4+/5   Left Hip Extension 4/5   Left Hip ABduction 4/5   Right/Left Knee --  5/5 bilat flex/ext      Flexibility   Hamstrings Rt 65 deg; Lt 70 deg    Quadriceps prone knee flexion Rt 90 deg Lt 95 deg   ITB tight bilat    Piriformis tight bilat     Palpation   Palpation  comment tenderness and tightness bilat lumbar spine through paraspinals and QL/Lats      Berg Balance Test   Sit to Stand Able to stand without using hands and stabilize independently   Standing Unsupported Able to stand safely 2 minutes   Sitting with Back Unsupported but Feet Supported on Floor or Stool Able to sit safely and securely 2 minutes   Stand to Sit Controls descent by using hands   Transfers Able to transfer safely, definite need of hands   Standing Unsupported with Eyes Closed Able to stand 10 seconds safely   Standing Ubsupported with Feet Together Able to place feet together independently and stand 1 minute safely   From Standing, Reach Forward with Outstretched Arm Can reach confidently >25 cm (10")   From Standing Position, Pick up Object from Floor Able to pick up shoe, needs supervision   From Standing Position, Turn to Look Behind Over each Shoulder Looks behind from both sides and weight shifts well   Turn 360 Degrees Able to turn 360 degrees safely but slowly   Standing Unsupported, Alternately Place Feet on Step/Stool Able to stand independently and complete 8 steps >20 seconds   Standing Unsupported, One Foot in [employer] balance while stepping or standing   Standing on One Leg Tries to lift leg/unable to hold 3 seconds but remains standing independently   Total Score 43   Berg comment: significant risk for falling      Functional Gait  Assessment   Gait assessed  --  ambulates with wide based gait - unsteady                    [facility] Adult PT Treatment/Exercise - 07/01/16 0001      Knee/Hip Exercises: Stretches   Passive Hamstring Stretch 2 reps;30 seconds  supine with strap    Quad Stretch 2 reps;30 seconds  prone with strap   Piriformis Stretch 2 reps;30 seconds  travell      Knee/Hip Exercises: Standing   Hip Abduction Right;Left;10 reps  leading with heel    Other Standing Knee Exercises standing with good wt bearing/stance      Moist  Heat Therapy   Number Minutes Moist Heat 20 Minutes  Moist Heat Location Lumbar Spine     Electrical Stimulation   Electrical Stimulation Location bilat lumbar   Electrical Stimulation Action IFC   Electrical Stimulation Parameters to tolerance   Electrical Stimulation Goals Pain;Tone                PT Education - 07/01/16 1057    Education provided Yes   Education Details HEP     Person(s) Educated Patient   Methods Explanation;Demonstration;Tactile cues;Verbal cues;Handout   Comprehension Verbalized understanding;Returned demonstration;Verbal cues required;Tactile cues required             PT Long Term Goals - 07/01/16 1236      PT LONG TERM GOAL #1   Title Increase strength bilat LE's to 4+/5 to 5/5 throughout 08/12/16   Time 6   Period Weeks   Status New     PT LONG TERM GOAL #2   Title Decrease Berg Balance Scale score by 5-8 points indicating lowered risk for falls 08/12/16   Time 6   Period Weeks   Status New     PT LONG TERM GOAL #3   Title Improve patient's confidence and safety in preforming ADL's including taking the garbage cans in and out of the house 08/12/16   Time 6   Period Weeks   Status New     PT LONG TERM GOAL #4   Title Independent in HEP 08/12/16   Time 6   Period Weeks   Status New               Plan - 07/01/16 1106    Clinical Impression Statement Arcely is seen for low complezity evaluation of balance deficits and LBP. She presents with poor posture and alignment; limited trunk and LE mobility; decreased core and bilat LE strength; decreased balance and increased risk for falls demonstrated by LE weakness and Berg Balance Scale score. [place] will benefit from PT to address problems identified and impeovew functional ability with increased safety/decreased risk of falls.    Rehab Potential Good   PT Frequency 2x / week   PT Duration 6 weeks   PT Treatment/Interventions Patient/family education;ADLs/Self Care Home  Management;Cryotherapy;Electrical Stimulation;Iontophoresis 4mg /ml Dexamethasone;Moist Heat;Ultrasound;Dry needling;Manual techniques;Therapeutic activities;Therapeutic exercise;Balance training;Neuromuscular re-education   PT Next Visit Plan progress with stretching IT band/hip flexors; core stabilizatin; LE strengthening; balance activities; modalities as indicated    Consulted and Agree with Plan of Care Patient      Patient will benefit from skilled therapeutic intervention in order to improve the following deficits and impairments:  Postural dysfunction, Improper body mechanics, Increased fascial restricitons, Increased muscle spasms, Decreased range of motion, Decreased mobility, Decreased strength, Decreased activity tolerance, Decreased balance  Visit Diagnosis: Unsteadiness on feet - Plan: PT plan of care cert/re-cert  Weakness generalized - Plan: PT plan of care cert/re-cert  Acute bilateral low back pain without sciatica - Plan: PT plan of care cert/re-cert     Problem List Patient Active Problem List   Diagnosis Date Noted  . History of breast cancer 12/30/2011  . Leukopenia 04/09/2011  . Breast cancer (HCC) 01/01/2011    Deem Marmol [name] PT, MPH  07/01/2016, 1:45 PM  [facility] [address] [place], [place], [address] Phone: [phone]   Fax:  [phone]  Name: Nancy Harrison MRN: [id number] Date of Birth: 12/28/1940

## 2016-07-01 NOTE — Patient Instructions (Addendum)
HIP: Hamstrings - Supine   Place strap around foot. Raise leg up, keeping knee straight.  Bend opposite knee to protect back if indicated. Hold 30 seconds. 3 reps per set, 2-3 sets per day   Piriformis Stretch   Lying on back, pull right knee toward opposite shoulder. Hold 30 seconds. Repeat 3 times. Do 2-3 sessions per day.   Quads / HF, Prone   Lie face down. Grasp one ankle with same-side hand. Use towel if needed to reach. Gently pull foot toward buttock.  Hold 30 seconds. Repeat 3 times per session. Do 2-3 sessions per day.   Standing feet shoulder width apart work on standing straight and tall  Stand during the commercials for a 30 min TV program 2-3 times a day        With tubing around right leg, other side toward anchor, extend leg out from side. Repeat __10__ times per set. Do ___2-3_ sets per session. Do __1-2__ sessions per day.   [employer] [place] snickers Strengthening: Hip Abduction - ResistedStrengthening: Hip Abduction - Resisted

## 2016-07-05 ENCOUNTER — Ambulatory Visit (INDEPENDENT_AMBULATORY_CARE_PROVIDER_SITE_OTHER): Payer: Medicare HMO | Admitting: Physical Therapy

## 2016-07-05 DIAGNOSIS — R2681 Unsteadiness on feet: Secondary | ICD-10-CM

## 2016-07-05 DIAGNOSIS — R531 Weakness: Secondary | ICD-10-CM | POA: Diagnosis not present

## 2016-07-05 DIAGNOSIS — M545 Low back pain, unspecified: Secondary | ICD-10-CM

## 2016-07-05 NOTE — Patient Instructions (Signed)
KNEE: Quadriceps - Prone    Place strap around ankle. Bring ankle toward buttocks. Press hip into surface. Hold _30__ seconds. _2-3__ reps per set, __1-2_ sets per day, _7__ days per week  3 part core exercise    With neutral spine, tighten pelvic floor, then tighten abdominal muscles sucking your belly button to back bone, then tighten muscles in low back at waist. Hold 10 seconds. Repeat _10_ times. Do _several__ times a day.  * When you have mastered this, you can contract all 3 muscle groups at same time.   * Progress to do this activity sitting, standing, walking and functional activities.   Tandem Stance    Right foot in front of left, heel touching toe both feet "straight ahead". Stand on Foot Triangle of Support with both feet. Balance in this position _15-30__ seconds. Do with left foot in front of right.   [facility] [place], [place] [address]  [phone] (office) [phone] (fax)

## 2016-07-05 NOTE — Therapy (Signed)
[place]  [place], [place], [address] Phone: [phone]   Fax:  [phone]  Physical Therapy Treatment  Patient Details  Name: Nancy Harrison MRN: [id number] Date of Birth: 09-Apr-1940 Referring Provider: Dr. Hulan Fess   Encounter Date: 07/05/2016      PT End of Session - 07/05/16 1433    Visit Number 2   Number of Visits 12   Date for PT Re-Evaluation 08/12/16   PT Start Time 3976   PT Stop Time 1528   PT Time Calculation (min) 56 min   Activity Tolerance Patient tolerated treatment well;No increased pain   Behavior During Therapy WFL for tasks assessed/performed      Past Medical History:  Diagnosis Date  . Arthritis   . Cancer ([place]) 1999   rt breast  . Cataract   . Hypertension     Past Surgical History:  Procedure Laterality Date  . BREAST LUMPECTOMY  1999   Chemo and radiation on right breast  . TONSILLECTOMY  1957  . 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      Subjective Assessment - 07/05/16 1434    Subjective Pt reports she is still sore from the fall on 4/24.  "It is some better; now the pain is just in middle of back".    Pertinent History bilat TKA 2000; arthritis    Patient Stated Goals to be able to bring in the garbage cans without falling; feel more confident with going up and down stairs; stop falling    Currently in Pain? Yes   Pain Score 4    Pain Location Back   Pain Orientation Left;Right;Lower   Pain Descriptors / Indicators Tightness;Sore   Aggravating Factors  changing positions, standing up   Pain Relieving Factors OTC anti-inflammatory; being still.             [facility] PT Assessment - 07/05/16 0001      Assessment   Medical Diagnosis Balance deficits   Referring Provider Dr. Hulan Fess    Onset Date/Surgical Date 06/15/16   Hand Dominance Right   Next MD Visit PRN   Prior Therapy after TKA ~2000     Precautions   Precautions None     Flexibility   Quadriceps prone knee flexion: Lt 115 deg, Rt 107 deg.            [facility] Adult PT Treatment/Exercise - 07/05/16 0001      Exercises   Exercises Knee/Hip;Lumbar     Lumbar Exercises: Standing   Other Standing Lumbar Exercises tandem stance 30 sec each side, 2 sets.     Lumbar Exercises: Seated   Sit to Stand 5 reps  with core engaged.      Lumbar Exercises: Supine   Ab Set 10 reps;5 seconds   AB Set Limitations tactile cues for technique   Bent Knee Raise 10 reps  with ab set. VC for technique.      Knee/Hip Exercises: Stretches   Passive Hamstring Stretch 2 reps;30 seconds  supine with strap    Quad Stretch 2 reps;30 seconds  prone with strap   Piriformis Stretch 2 reps;30 seconds  travell      Knee/Hip Exercises: Aerobic   Nustep L4: 5 min      Moist Heat Therapy   Number Minutes Moist Heat 15 Minutes   Moist Heat Location Lumbar Spine     Electrical  Stimulation   Electrical Stimulation Location bilat lumbar    Electrical Stimulation Action IFC    Electrical Stimulation Parameters to tolerance    Electrical Stimulation Goals Pain                PT Education - 07/05/16 1520    Education provided Yes   Education Details HEP    Person(s) Educated Patient   Methods Handout;Explanation;Demonstration   Comprehension Verbalized understanding;Returned demonstration             PT Long Term Goals - 07/01/16 1236      PT LONG TERM GOAL #1   Title Increase strength bilat LE's to 4+/5 to 5/5 throughout 08/12/16   Time 6   Period Weeks   Status New     PT LONG TERM GOAL #2   Title Decrease Berg Balance Scale score by 5-8 points indicating lowered risk for falls 08/12/16   Time 6   Period Weeks   Status New     PT LONG TERM GOAL #3   Title Improve patient's confidence and safety in preforming ADL's including taking the garbage cans in and out of the house 08/12/16   Time 6   Period Weeks    Status New     PT LONG TERM GOAL #4   Title Independent in HEP 08/12/16   Time 6   Period Weeks   Status New               Plan - 07/05/16 1515    Clinical Impression Statement Pt tolerated all exercise well, reporting reduced pain.    Rehab Potential Good   PT Frequency 2x / week   PT Duration 6 weeks   PT Treatment/Interventions Patient/family education;ADLs/Self Care Home Management;Cryotherapy;Electrical Stimulation;Iontophoresis 4mg /ml Dexamethasone;Moist Heat;Ultrasound;Dry needling;Manual techniques;Therapeutic activities;Therapeutic exercise;Balance training;Neuromuscular re-education   PT Next Visit Plan Review and advance 3 part core. Continue standing static/dynamic balance exercise.    Consulted and Agree with Plan of Care Patient      Patient will benefit from skilled therapeutic intervention in order to improve the following deficits and impairments:  Postural dysfunction, Improper body mechanics, Increased fascial restricitons, Increased muscle spasms, Decreased range of motion, Decreased mobility, Decreased strength, Decreased activity tolerance, Decreased balance  Visit Diagnosis: Unsteadiness on feet  Weakness generalized  Acute bilateral low back pain without sciatica     Problem List Patient Active Problem List   Diagnosis Date Noted  . History of breast cancer 12/30/2011  . Leukopenia 04/09/2011  . Breast cancer ([place]) 01/01/2011   Nancy Harrison, PTA 07/05/16 3:21 PM  [place], [place], [address] Phone: [phone]   Fax:  [phone]  Name: Nancy Harrison MRN: [id number] Date of Birth: 1940-03-27

## 2016-07-07 ENCOUNTER — Encounter: Payer: Self-pay | Admitting: Rehabilitative and Restorative Service Providers"

## 2016-07-07 ENCOUNTER — Ambulatory Visit (INDEPENDENT_AMBULATORY_CARE_PROVIDER_SITE_OTHER): Payer: Medicare HMO | Admitting: Rehabilitative and Restorative Service Providers"

## 2016-07-07 DIAGNOSIS — M545 Low back pain, unspecified: Secondary | ICD-10-CM

## 2016-07-07 DIAGNOSIS — R2681 Unsteadiness on feet: Secondary | ICD-10-CM | POA: Diagnosis not present

## 2016-07-07 DIAGNOSIS — R531 Weakness: Secondary | ICD-10-CM

## 2016-07-07 NOTE — Therapy (Signed)
[place], [place], [address] Phone: [phone]   Fax:  [phone]  Physical Therapy Treatment  Patient Details  Name: Nancy Harrison MRN: [id number] Date of Birth: Feb 25, 1940 Referring Provider: Dr. Hulan Fess   Encounter Date: 07/07/2016      PT End of Session - 07/07/16 1116    Visit Number 3   Number of Visits 12   Date for PT Re-Evaluation 08/12/16   PT Start Time 1108   PT Stop Time 1205   PT Time Calculation (min) 57 min   Activity Tolerance Patient tolerated treatment well      Past Medical History:  Diagnosis Date  . Arthritis   . Cancer ([place]) 1999   rt breast  . Cataract   . Hypertension     Past Surgical History:  Procedure Laterality Date  . BREAST LUMPECTOMY  1999   Chemo and radiation on right breast  . TONSILLECTOMY  1957  . 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      Subjective Assessment - 07/07/16 1118    Subjective Back is a little tight this morning. She did some stretching and took some antiinflammatory and it feesl some better now. Working on exercises at    Currently in Pain? Yes   Pain Score 3    Pain Location Back   Pain Orientation Left;Right;Lower   Pain Descriptors / Indicators Tightness;Sore   Pain Type Acute pain   Pain Onset 1 to 4 weeks ago   Pain Frequency Intermittent                         [facility] Adult PT Treatment/Exercise - 07/07/16 0001      Therapeutic Activites    Therapeutic Activities --  pt instructed in use and care of TENS unit      Knee/Hip Exercises: Aerobic   Nustep L5: 5 min      Knee/Hip Exercises: Standing   Hip Abduction Stengthening;Right;Left;2 sets;10 reps  leading with heel - red TB    Hip Extension Stengthening;Right;Left;2 sets;10 reps   Forward Step Up Right;Left;2 sets;20 reps;Step Height: 4";Step Height: 6"   Gait Training standing  reaching up/out/in  x 10; reaching across body to touch cabinet x 10 each    Other Standing Knee Exercises hip flexion for toe tap 12 inch step alternating feet x 20 each; standing tap toe fwd/side/back alternating feet; SLS 30 sec x  4 each LE - UE support on stair railing as needed    Other Standing Knee Exercises side steps 12 ft x 10; backward walking 12 ft x 5; heel toe 12 ft x 5      Moist Heat Therapy   Number Minutes Moist Heat 20 Minutes   Moist Heat Location Lumbar Spine     Electrical Stimulation   Electrical Stimulation Location bilat lumbar    Electrical Stimulation Action TENS    Electrical Stimulation Parameters to tolerance    Electrical Stimulation Goals Pain;Tone                PT Education - 07/07/16 1212    Education provided Yes   Education Details HEP TENs    Person(s) Educated Patient   Methods Explanation;Demonstration;Tactile cues;Verbal cues;Handout   Comprehension Verbalized understanding;Returned demonstration;Verbal cues required;Tactile cues required             PT  Long Term Goals - 07/07/16 1120      PT LONG TERM GOAL #1   Title Increase strength bilat LE's to 4+/5 to 5/5 throughout 08/12/16   Time 6   Period Weeks   Status On-going     PT LONG TERM GOAL #2   Title Decrease Berg Balance Scale score by 5-8 points indicating lowered risk for falls 08/12/16   Time 6   Period Weeks   Status On-going     PT LONG TERM GOAL #3   Title Improve patient's confidence and safety in preforming ADL's including taking the garbage cans in and out of the house 08/12/16   Time 6   Period Weeks   Status On-going     PT LONG TERM GOAL #4   Title Independent in HEP 08/12/16   Time 6   Period Weeks   Status On-going               Plan - 07/07/16 1207    Clinical Impression Statement Continued muscular tightness notes Lt > Rt lumbar to SI area. Patient tolerated exercise in standing for strengthening and balance well.    Rehab Potential  Good   PT Frequency 2x / week   PT Duration 6 weeks   PT Treatment/Interventions Patient/family education;ADLs/Self Care Home Management;Cryotherapy;Electrical Stimulation;Iontophoresis 4mg /ml Dexamethasone;Moist Heat;Ultrasound;Dry needling;Manual techniques;Therapeutic activities;Therapeutic exercise;Balance training;Neuromuscular re-education   PT Next Visit Plan Review and advance 3 part core. Continue standing static/dynamic balance exercise.    Consulted and Agree with Plan of Care Patient      Patient will benefit from skilled therapeutic intervention in order to improve the following deficits and impairments:  Postural dysfunction, Improper body mechanics, Increased fascial restricitons, Increased muscle spasms, Decreased range of motion, Decreased mobility, Decreased strength, Decreased activity tolerance, Decreased balance  Visit Diagnosis: Unsteadiness on feet  Weakness generalized  Acute bilateral low back pain without sciatica     Problem List Patient Active Problem List   Diagnosis Date Noted  . History of breast cancer 12/30/2011  . Leukopenia 04/09/2011  . Breast cancer ([place]) 01/01/2011    Celyn Nilda Simmer PT, MPH  07/07/2016, 12:21 PM  [facility] [place], [place], [address] Phone: [phone]   Fax:  [phone]  Name: Nancy Harrison MRN: [id number] Date of Birth: Jun 13, 1940

## 2016-07-07 NOTE — Patient Instructions (Addendum)
Strengthening: Hip Abduction - Resisted    With tubing around right thigh, other side toward anchor, extend leg out from side. Repeat __10__ times per set. Do ___2-3_ sets per session. Do __1-2__ sessions per day.   Strengthening: Hip Extension - Resisted    With tubing aroundthigh face anchor and pull leg straight back. Repeat __10__ times per set. Do _2-3___ sets per session. Do _1-2___ sessions per day.   Forward    Facing step, place one leg on step, flexed at hip. Step up slowly, bringing hips in line with knee and shoulder. Bring other foot onto step. Reverse process to step back down. Repeat with other leg. Do __20-30__ repetitions, __1-2__ sets.   Standing on one leg  Balance for 30 sec x 3-5 reps each leg  Standing on one leg tap opposite toe forward/side/back then back by foot  Repeat with other leg 5-10 times each  Side steps; backward walking; heel to toe walking at kitchen counter  Several times

## 2016-07-08 NOTE — Therapy (Signed)
Next Visit Plan Review and advance 3 part core. Continue standing static/dynamic balance exercise.    Consulted and Agree with Plan of Care Patient      Patient will benefit from skilled therapeutic intervention in order to improve the following deficits and impairments:  Postural dysfunction, Improper body mechanics, Increased fascial restricitons, Increased muscle spasms, Decreased range of motion, Decreased mobility, Decreased strength, Decreased activity tolerance, Decreased balance  Visit Diagnosis: Unsteadiness on feet  Weakness generalized  Acute bilateral low back pain without sciatica     Problem List Patient Active Problem List   Diagnosis Date Noted  . History of breast cancer 12/30/2011  . Leukopenia 04/09/2011  . Breast cancer ([place]) 01/01/2011    Antonino Nienhuis Nilda Simmer PT, MPH  07/08/2016, 8:06 AM  [facility] [place], [place], [address] Phone: [phone]   Fax:  [phone]  Name: Nancy Harrison MRN: [id number] Date of Birth: 1940-05-06  [place], [place], [address] Phone: [phone]   Fax:  [phone]  Physical Therapy Treatment  Patient Details  Name: Nancy Harrison MRN: [id number] Date of Birth: [birth year] Referring Provider: Dr. Hulan Fess   Encounter Date: 07/07/2016      PT End of Session - 07/07/16 1116    Visit Number 3   Number of Visits 12   Date for PT Re-Evaluation 08/12/16   PT Start Time 1108   PT Stop Time 1205   PT Time Calculation (min) 57 min   Activity Tolerance Patient tolerated treatment well      Past Medical History:  Diagnosis Date  . Arthritis   . Cancer ([place]) 1999   rt breast  . Cataract   . Hypertension     Past Surgical History:  Procedure Laterality Date  . BREAST LUMPECTOMY  1999   Chemo and radiation on right breast  . TONSILLECTOMY  1957  . 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      Subjective Assessment - 07/07/16 1118    Subjective Back is a little tight this morning. She did some stretching and took some antiinflammatory and it feesl some better now. Working on exercises at    Currently in Pain? Yes   Pain Score 3    Pain Location Back   Pain Orientation Left;Right;Lower   Pain Descriptors / Indicators Tightness;Sore   Pain Type Acute pain   Pain Onset 1 to 4 weeks ago   Pain Frequency Intermittent                                 PT Education - 07/07/16 1212    Education provided Yes   Education Details HEP TENs    Person(s) Educated Patient   Methods Explanation;Demonstration;Tactile cues;Verbal cues;Handout   Comprehension Verbalized understanding;Returned demonstration;Verbal cues required;Tactile cues required             PT Long Term Goals - 07/07/16 1120      PT LONG TERM GOAL #1   Title Increase strength bilat LE's to 4+/5 to 5/5 throughout 08/12/16   Time 6   Period Weeks    Status On-going     PT LONG TERM GOAL #2   Title Decrease Berg Balance Scale score by 5-8 points indicating lowered risk for falls 08/12/16   Time 6   Period Weeks   Status On-going     PT LONG TERM GOAL #3   Title Improve patient's confidence and safety in preforming ADL's including taking the garbage cans in and out of the house 08/12/16   Time 6   Period Weeks   Status On-going     PT LONG TERM GOAL #4   Title Independent in HEP 08/12/16   Time 6   Period Weeks   Status On-going               Plan - 07/07/16 1207    Clinical Impression Statement Continued muscular tightness notes Lt > Rt lumbar to SI area. Patient tolerated exercise in standing for strengthening and balance well.    Rehab Potential Good   PT Frequency 2x / week   PT Duration 6 weeks   PT Treatment/Interventions Patient/family education;ADLs/Self Care Home Management;Cryotherapy;Electrical Stimulation;Iontophoresis 4mg /ml Dexamethasone;Moist Heat;Ultrasound;Dry needling;Manual techniques;Therapeutic activities;Therapeutic exercise;Balance training;Neuromuscular re-education   PT

## 2016-07-12 ENCOUNTER — Ambulatory Visit (INDEPENDENT_AMBULATORY_CARE_PROVIDER_SITE_OTHER): Payer: Medicare HMO | Admitting: Physical Therapy

## 2016-07-12 DIAGNOSIS — R531 Weakness: Secondary | ICD-10-CM | POA: Diagnosis not present

## 2016-07-12 DIAGNOSIS — M545 Low back pain, unspecified: Secondary | ICD-10-CM

## 2016-07-12 DIAGNOSIS — R2681 Unsteadiness on feet: Secondary | ICD-10-CM

## 2016-07-12 NOTE — Therapy (Signed)
[place], [place], [address] Phone: [phone]   Fax:  [phone]  Physical Therapy Treatment  Patient Details  Name: Nancy Harrison MRN: [id number] Date of Birth: 1940-04-07 Referring Provider: Dr. Hulan Fess   Encounter Date: 07/12/2016      PT End of Session - 07/12/16 1436    Visit Number 4   Number of Visits 12   Date for PT Re-Evaluation 08/12/16   PT Start Time 4765   PT Stop Time 4650   PT Time Calculation (min) 44 min   Activity Tolerance Patient tolerated treatment well;No increased pain   Behavior During Therapy WFL for tasks assessed/performed      Past Medical History:  Diagnosis Date  . Arthritis   . Cancer ([place]) 1999   rt breast  . Cataract   . Hypertension     Past Surgical History:  Procedure Laterality Date  . BREAST LUMPECTOMY  1999   Chemo and radiation on right breast  . TONSILLECTOMY  1957  . 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      Subjective Assessment - 07/12/16 1437    Subjective Pt reports she still has some soreness in her low back. She has used [employer] with some relief.  She feels the tandem stance is getting a little easier.    Pertinent History bilat TKA 2000; arthritis    Patient Stated Goals to be able to bring in the garbage cans without falling; feel more confident with going up and down stairs; stop falling    Currently in Pain? Yes   Pain Score 3   took ibprofen prior to treatment   Pain Location Back   Pain Orientation Right;Left;Lower   Pain Descriptors / Indicators Dull   Aggravating Factors  walking, changing positions   Pain Relieving Factors medicine, being still.             [facility] PT Assessment - 07/12/16 0001      Berg Balance Test   Sit to Stand Able to stand without using hands and stabilize independently   Standing Unsupported Able to stand safely 2 minutes   Sitting with Back Unsupported but Feet Supported on Floor or Stool Able to sit safely and securely 2 minutes   Stand to Sit Sits safely with minimal use of hands   Transfers Able to transfer safely, minor use of hands   Standing Unsupported with Eyes Closed Able to stand 10 seconds safely   Standing Ubsupported with Feet Together Able to place feet together independently and stand 1 minute safely   From Standing, Reach Forward with Outstretched Arm Can reach forward >12 cm safely (5")   From Standing Position, Pick up Object from Floor Able to pick up shoe safely and easily   From Standing Position, Turn to Look Behind Over each Shoulder Looks behind from both sides and weight shifts well   Turn 360 Degrees Able to turn 360 degrees safely in 4 seconds or less   Standing Unsupported, Alternately Place Feet on Step/Stool Able to stand independently and safely and complete 8 steps in 20 seconds   Standing Unsupported, One Foot in Front Able to plae foot ahead of the other independently and hold 30 seconds   Standing on One Leg Able to lift leg independently and hold 5-10 seconds   Total Score 53  [facility] Adult PT Treatment/Exercise - 07/12/16 0001      Lumbar Exercises: Supine   Ab Set 5 reps;5 seconds   Heel Slides 5 reps  with ab set   Bent Knee Raise 10 reps  with ab set     Knee/Hip Exercises: Stretches   Passive Hamstring Stretch Right;Left;2 reps   Quad Stretch Right;Left;2 reps;30 seconds  seated, foot under chair.    Piriformis Stretch Right;Left;2 reps;30 seconds  seated     Knee/Hip Exercises: Aerobic   Nustep L4: 6 min      Knee/Hip Exercises: Standing   SLS 3 trials each leg:  LLE up to 8 seconds, RLE up to 5 sec.    Other Standing Knee Exercises Toe taps to 6" step x 10 reps.  Tandem stance x 30 sec x 3 reps each side.       Knee/Hip Exercises: Seated   Sit to Sand 5 reps;without UE support  core engaged      Modalities   Modalities --  pt declined, will do  at home.            PT Long Term Goals - 07/12/16 1449      PT LONG TERM GOAL #1   Title Increase strength bilat LE's to 4+/5 to 5/5 throughout 08/12/16   Time 6   Period Weeks   Status On-going     PT LONG TERM GOAL #2   Title Decrease Berg Balance Scale score by 5-8 points indicating lowered risk for falls 08/12/16   Time 6   Period Weeks   Status Achieved     PT LONG TERM GOAL #3   Title Improve patient's confidence and safety in preforming ADL's including taking the garbage cans in and out of the house 08/12/16   Time 6   Period Weeks     PT LONG TERM GOAL #4   Title Independent in HEP 08/12/16   Time 6   Period Weeks   Status On-going               Plan - 07/12/16 1516    Clinical Impression Statement Improved balance with Berg assessment; has met LTG#2.  Pt tolerated all exercises without increase in pain; declined modalities - will do once at home.  Progressing towards goals.     Rehab Potential Good   PT Frequency 2x / week   PT Duration 6 weeks   PT Treatment/Interventions Patient/family education;ADLs/Self Care Home Management;Cryotherapy;Electrical Stimulation;Iontophoresis 62m/ml Dexamethasone;Moist Heat;Ultrasound;Dry needling;Manual techniques;Therapeutic activities;Therapeutic exercise;Balance training;Neuromuscular re-education   PT Next Visit Plan Continue standing static/dynamic balance exercise.    Consulted and Agree with Plan of Care Patient      Patient will benefit from skilled therapeutic intervention in order to improve the following deficits and impairments:  Postural dysfunction, Improper body mechanics, Increased fascial restricitons, Increased muscle spasms, Decreased range of motion, Decreased mobility, Decreased strength, Decreased activity tolerance, Decreased balance  Visit Diagnosis: Unsteadiness on feet  Weakness generalized  Acute bilateral low back pain without sciatica     Problem List Patient Active Problem List    Diagnosis Date Noted  . History of breast cancer 12/30/2011  . Leukopenia 04/09/2011  . [place] ([place] 01/01/2011   JKerin Perna PTA 07/12/16 4:50 PM  [place] [address]Phone: [phone]  Fax:  [phone] Name: SIdalie CantoMRN: [id number] of Birth: 802/27/1942

## 2016-07-14 ENCOUNTER — Ambulatory Visit (INDEPENDENT_AMBULATORY_CARE_PROVIDER_SITE_OTHER): Payer: Medicare HMO | Admitting: Rehabilitative and Restorative Service Providers"

## 2016-07-14 ENCOUNTER — Encounter: Payer: Self-pay | Admitting: Rehabilitative and Restorative Service Providers"

## 2016-07-14 DIAGNOSIS — R531 Weakness: Secondary | ICD-10-CM

## 2016-07-14 DIAGNOSIS — M545 Low back pain, unspecified: Secondary | ICD-10-CM

## 2016-07-14 DIAGNOSIS — R2681 Unsteadiness on feet: Secondary | ICD-10-CM

## 2016-07-14 NOTE — Patient Instructions (Addendum)
Strengthening: Hip Abductor - Resisted    With band looped around both legs above knees, push one knee out holding the other knee still. Pause.  Repeat __10__ times per set. Do __2-3__ sets per session. Do __1__ sessions per day.    Strengthening: Hip Abduction (Side-Lying)    Tighten muscles on front of left thigh, then lift leg from surface, keeping knee straight.  Repeat __10__ times per set. Do _2-3___ sets per session. Do __1__ sessions per day.    Bridging    Slowly raise buttocks from floor, keeping core tight. reen band around thighs Repeat _10___ times per set. Do __1-2__ sets per session. Do ___1_ sessions per day.   Sitting    Sit in chair pillow resting on knees. Bend forward toward floor. Comfortable stretch should be felt in lower back. Hold __30-45_ seconds. Repeat _2-3__ times per session. Do __2-3_ sessions per day.   Work on standing on Lt leg and lifting the Rt leg without the right hip dropping  thinkm about squeezing buttocks tightly when stepping on the left leg

## 2016-07-14 NOTE — Therapy (Signed)
[place], [place], [address] Phone: [phone]   Fax:  [phone]  Physical Therapy Treatment  Patient Details  Name: Nancy Harrison MRN: [id number] Date of Birth: 09/24/1940 Referring Provider: Dr Hulan Fess  Encounter Date: 07/14/2016      PT End of Session - 07/14/16 1106    Visit Number 5   Number of Visits 12   Date for PT Re-Evaluation 08/12/16   PT Start Time 1105   PT Stop Time 1157   PT Time Calculation (min) 52 min   Equipment Utilized During Treatment Gait belt   Activity Tolerance Patient tolerated treatment well      Past Medical History:  Diagnosis Date  . Arthritis   . Cancer ([place]) 1999   rt breast  . Cataract   . Hypertension     Past Surgical History:  Procedure Laterality Date  . BREAST LUMPECTOMY  1999   Chemo and radiation on right breast  . TONSILLECTOMY  1957  . 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      Subjective Assessment - 07/14/16 1108    Subjective Pt reports she still has some soreness in her low back. She has used [employer] with some temporary relief. Pain and soreness have not changed much. Balance seems to be getting a little bettre. Has found a clock in the kitchen with a second hand that makes it easier to do the exercises.     Currently in Pain? Yes   Pain Score 5    Pain Location Back   Pain Orientation Right;Lower   Pain Descriptors / Indicators Dull;Sore   Pain Type Acute pain   Pain Onset More than a month ago   Pain Frequency Intermittent            [facility] PT Assessment - 07/14/16 0001      Assessment   Medical Diagnosis Balance deficits   Referring Provider Dr Hulan Fess   Onset Date/Surgical Date 06/15/16   Hand Dominance Right   Next MD Visit PRN   Prior Therapy after TKA ~2000     Strength   Right Hip ABduction 4/5   Left Hip ABduction 4-/5      Ambulation/Gait   Gait Comments (+) [place] Lt - noted in standing with lifting Rt LE and with gait pattern                      [facility] Adult PT Treatment/Exercise - 07/14/16 0001      Lumbar Exercises: Stretches   Quadruped Mid Back Stretch Limitations seated lumbar stretch rolling forward over pillow 30 sec x 2; standing rolling forward over orange ball on low table 30 sec x 3      Lumbar Exercises: Standing   Other Standing Lumbar Exercises working on activation of Lt hip abductors with standing activities and walking - verbal and tactile cues      Knee/Hip Exercises: Aerobic   Nustep L5: 6 min      Knee/Hip Exercises: Standing   Hip Abduction Stengthening;Right;Left;2 sets;10 reps   SLS 3 trials each leg:  LLE up to 8 seconds, RLE up to 5 sec.  added hand hold assist as needed for 20 sec x 2 each LE    Other Standing Knee Exercises Toe taps to 12" step x 10 reps.     Other Standing Knee Exercises side  steps 12 ft x 10; backward walking 12 ft x 5; heel toe 12 ft x 5      Knee/Hip Exercises: Supine   Bridges with Clamshell Strengthening;Both;10 reps  green TB    Other Supine Knee/Hip Exercises clam holding Rt to move Lt and reverse x 10 each LE with greee TB     Knee/Hip Exercises: Sidelying   Hip ABduction Strengthening;Right;Left;2 sets;10 reps  weaker Lt      Moist Heat Therapy   Number Minutes Moist Heat 20 Minutes   Moist Heat Location Lumbar Spine     Electrical Stimulation   Electrical Stimulation Location bilat lumbar    Electrical Stimulation Action IFC   Electrical Stimulation Parameters to tolerance   Electrical Stimulation Goals Pain;Tone                PT Education - 07/14/16 1154    Education provided Yes   Education Details HEP    Person(s) Educated Patient   Methods Explanation;Demonstration;Tactile cues;Verbal cues;Handout   Comprehension Verbalized understanding;Returned demonstration;Verbal cues required;Tactile cues  required             PT Long Term Goals - 07/12/16 1449      PT LONG TERM GOAL #1   Title Increase strength bilat LE's to 4+/5 to 5/5 throughout 08/12/16   Time 6   Period Weeks   Status On-going     PT LONG TERM GOAL #2   Title Decrease Berg Balance Scale score by 5-8 points indicating lowered risk for falls 08/12/16   Time 6   Period Weeks   Status Achieved     PT LONG TERM GOAL #3   Title Improve patient's confidence and safety in preforming ADL's including taking the garbage cans in and out of the house 08/12/16   Time 6   Period Weeks     PT LONG TERM GOAL #4   Title Independent in HEP 08/12/16   Time 6   Period Weeks   Status On-going               Plan - 07/14/16 1110    Clinical Impression Statement Persistent LBP and soreness with minimal change. [place] notes improved balance and a little more confidence with walking. Note [place] Lt LE with standing and with gait. Will benefit from strengthening Lt hip abductors which should help the gait pattern and stability/balance as well as the LBP.    Rehab Potential Good   PT Frequency 2x / week   PT Duration 6 weeks   PT Treatment/Interventions Patient/family education;ADLs/Self Care Home Management;Cryotherapy;Electrical Stimulation;Iontophoresis 4mg /ml Dexamethasone;Moist Heat;Ultrasound;Dry needling;Manual techniques;Therapeutic activities;Therapeutic exercise;Balance training;Neuromuscular re-education   PT Next Visit Plan Continue standing static/dynamic balance exercise. Address LBP as noted.    Consulted and Agree with Plan of Care Patient      Patient will benefit from skilled therapeutic intervention in order to improve the following deficits and impairments:  Postural dysfunction, Improper body mechanics, Increased fascial restricitons, Increased muscle spasms, Decreased range of motion, Decreased mobility, Decreased strength, Decreased activity tolerance, Decreased balance  Visit  Diagnosis: Unsteadiness on feet  Weakness generalized  Acute bilateral low back pain without sciatica     Problem List Patient Active Problem List   Diagnosis Date Noted  . History of breast cancer 12/30/2011  . Leukopenia 04/09/2011  . Breast cancer ([place]) 01/01/2011    Celyn Nilda Simmer PT, MPH  07/14/2016, 12:22 PM  [facility] [place],  [place], [address] Phone: [phone]   Fax:  [phone]  Name: Arian Mcquitty MRN: [id number] Date of Birth: 1940-12-07

## 2016-07-21 ENCOUNTER — Ambulatory Visit (INDEPENDENT_AMBULATORY_CARE_PROVIDER_SITE_OTHER): Payer: Medicare HMO | Admitting: Rehabilitative and Restorative Service Providers"

## 2016-07-21 ENCOUNTER — Encounter: Payer: Self-pay | Admitting: Rehabilitative and Restorative Service Providers"

## 2016-07-21 DIAGNOSIS — R2681 Unsteadiness on feet: Secondary | ICD-10-CM

## 2016-07-21 DIAGNOSIS — R531 Weakness: Secondary | ICD-10-CM | POA: Diagnosis not present

## 2016-07-21 DIAGNOSIS — M545 Low back pain, unspecified: Secondary | ICD-10-CM

## 2016-07-21 NOTE — Patient Instructions (Signed)

## 2016-07-21 NOTE — Therapy (Signed)
[place], [place], [address] Phone: [phone]   Fax:  [phone]  Physical Therapy Treatment  Patient Details  Name: Nancy Harrison MRN: [id number] Date of Birth: 09-26-1940 Referring Provider: Dr Hulan Fess  Encounter Date: 07/21/2016      PT End of Session - 07/21/16 0939    Visit Number 6   Number of Visits 12   Date for PT Re-Evaluation 08/12/16   PT Start Time 0930   PT Stop Time 1024   PT Time Calculation (min) 54 min   Activity Tolerance Patient tolerated treatment well      Past Medical History:  Diagnosis Date  . Arthritis   . Cancer ([place]) 1999   rt breast  . Cataract   . Hypertension     Past Surgical History:  Procedure Laterality Date  . BREAST LUMPECTOMY  1999   Chemo and radiation on right breast  . TONSILLECTOMY  1957  . 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      Subjective Assessment - 07/21/16 0939    Subjective Back pain has continued over the past couple of days. Has not taken as much OTC antiinflammatory over the past several days which might be why she hurts more. She is working on the exercises at home "but not like I should". Balance is getting some better.    Currently in Pain? Yes   Pain Score 5    Pain Location Back   Pain Orientation Lower;Mid   Pain Descriptors / Indicators Dull;Sore   Pain Onset More than a month ago   Pain Frequency Intermittent                         [facility] Adult PT Treatment/Exercise - 07/21/16 0001      Knee/Hip Exercises: Aerobic   Nustep L5: 6 min      Knee/Hip Exercises: Standing   Hip Abduction Stengthening;Right;Left;10 reps;3 sets  green TB distal thighs   Hip Extension Stengthening;Right;Left;10 reps;3 sets  green TB     Knee/Hip Exercises: Supine   Bridges with Clamshell Strengthening;Both;10 reps  green TB    Other Supine Knee/Hip  Exercises clam holding Rt to move Lt and reverse x 10 each LE with greee TB     Knee/Hip Exercises: Sidelying   Hip ABduction Strengthening;Right;Left;10 reps;2 sets  weaker Lt    Clams sidelying clams x 20 each LE green TB      Moist Heat Therapy   Number Minutes Moist Heat 20 Minutes   Moist Heat Location Lumbar Spine     Electrical Stimulation   Electrical Stimulation Location bilat lumbar    Electrical Stimulation Action IFC   Electrical Stimulation Parameters to tolerance   Electrical Stimulation Goals Pain;Tone     Manual Therapy   Manual therapy comments pt prone    Soft tissue mobilization through lumbar paraspinals; lats; QL bilat   Myofascial Release bilat lumbar           Trigger Point Dry Needling - 07/21/16 1212    Consent Given? Yes   Education Handout Provided Yes   Muscles Treated Upper Body Longissimus  bilat lumbar    Longissimus Response Palpable increased muscle length  one DN each side               PT Education - 07/21/16 1019    Education  provided Yes   Education Details DN   Person(s) Educated Patient   Methods Explanation   Comprehension Verbalized understanding             PT Long Term Goals - 07/21/16 0946      PT LONG TERM GOAL #1   Title Increase strength bilat LE's to 4+/5 to 5/5 throughout 08/12/16   Time 6   Period Weeks   Status On-going     PT LONG TERM GOAL #2   Title Decrease Berg Balance Scale score by 5-8 points indicating lowered risk for falls 08/12/16   Time 6   Period Weeks   Status Achieved     PT LONG TERM GOAL #3   Title Improve patient's confidence and safety in preforming ADL's including taking the garbage cans in and out of the house 08/12/16   Time 6   Period Weeks   Status On-going     PT LONG TERM GOAL #4   Title Independent in HEP 08/12/16   Time 6   Period Weeks   Status On-going               Plan - 07/21/16 0944    Clinical Impression Statement Cotninued LBP; improving  balance. Continued tightness noted Rt lumbar and QL/lat areas. [place] Lt LE stance. Needs continued work for hip abductor strengthening. Tolerated small trial of DN bilat lumbar paraspinals L4/5 area.    Rehab Potential Good   PT Frequency 2x / week   PT Duration 6 weeks   PT Treatment/Interventions Patient/family education;ADLs/Self Care Home Management;Cryotherapy;Electrical Stimulation;Iontophoresis 4mg /ml Dexamethasone;Moist Heat;Ultrasound;Dry needling;Manual techniques;Therapeutic activities;Therapeutic exercise;Balance training;Neuromuscular re-education   PT Next Visit Plan Continue standing static/dynamic balance exercise.  manual work and modalities to address LBP as indicated - assess response to DN    Consulted and Agree with Plan of Care Patient      Patient will benefit from skilled therapeutic intervention in order to improve the following deficits and impairments:  Postural dysfunction, Improper body mechanics, Increased fascial restricitons, Increased muscle spasms, Decreased range of motion, Decreased mobility, Decreased strength, Decreased activity tolerance, Decreased balance  Visit Diagnosis: Unsteadiness on feet  Weakness generalized  Acute bilateral low back pain without sciatica     Problem List Patient Active Problem List   Diagnosis Date Noted  . History of breast cancer 12/30/2011  . Leukopenia 04/09/2011  . Breast cancer ([place]) 01/01/2011    Celyn Nilda Simmer PT, MPH  07/21/2016, 12:15 PM  [facility] [place], [place], [address] Phone: [phone]   Fax:  [phone]  Name: Nancy Harrison MRN: [id number] Date of Birth: [birth year]

## 2016-07-26 ENCOUNTER — Encounter: Payer: Medicare HMO | Admitting: Rehabilitative and Restorative Service Providers"

## 2016-07-29 ENCOUNTER — Encounter: Payer: Self-pay | Admitting: Rehabilitative and Restorative Service Providers"

## 2016-07-29 ENCOUNTER — Ambulatory Visit (INDEPENDENT_AMBULATORY_CARE_PROVIDER_SITE_OTHER): Payer: Medicare HMO | Admitting: Rehabilitative and Restorative Service Providers"

## 2016-07-29 DIAGNOSIS — R2681 Unsteadiness on feet: Secondary | ICD-10-CM

## 2016-07-29 DIAGNOSIS — M545 Low back pain, unspecified: Secondary | ICD-10-CM

## 2016-07-29 DIAGNOSIS — R531 Weakness: Secondary | ICD-10-CM

## 2016-07-29 NOTE — Therapy (Signed)
[facility] [place] [address] [place], [place], [address] Phone: [phone]   Fax:  [phone]  Physical Therapy Treatment  Patient Details  Name: Nancy Harrison MRN: [id number] Date of Birth: 11-Apr-1940 Referring Provider: Dr Catha Gosselin   Encounter Date: 07/29/2016      PT End of Session - 07/29/16 1020    Visit Number 7   Number of Visits 12   Date for PT Re-Evaluation 08/12/16   PT Start Time 1015   PT Stop Time 1111   PT Time Calculation (min) 56 min   Activity Tolerance Patient tolerated treatment well      Past Medical History:  Diagnosis Date  . Arthritis   . 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 Cataract   . Hypertension     Past Surgical History:  Procedure Laterality Date  . BREAST LUMPECTOMY  1999   Chemo and radiation on right breast  . TONSILLECTOMY  1957  . 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      Subjective Assessment - 07/29/16 1022    Subjective Balance seems to be improving. Working on those exercises 1-2 times/day. Can see progress with the exercises. Lt LBP persists on an intermittent basis. She has some pain that is in different locations - sometimes Lt lower and then mid low back.    Currently in Pain? Yes   Pain Score 3    Pain Location Back   Pain Orientation Left;Mid;Lower   Pain Descriptors / Indicators Dull;Sore   Pain Type Acute pain   Pain Onset More than a month ago            [facility] PT Assessment - 07/29/16 0001      Assessment   Medical Diagnosis Balance deficits   Referring Provider Dr Catha Gosselin    Onset Date/Surgical Date 06/15/16   Hand Dominance Right   Next MD Visit PRN   Prior Therapy after TKA ~2000     AROM   Lumbar Flexion 80%   Lumbar Extension 55%   Lumbar - Right Side Bend 65%   Lumbar - Left Side Bend 65%   Lumbar - Right Rotation 35%   Lumbar - Left Rotation 40%     Strength   Right Hip  Extension 4+/5   Right Hip ABduction 4+/5   Left Hip Extension 4/5   Left Hip ABduction 4-/5     Palpation   Palpation comment tenderness and tightness Lt > Rt lumbar spine through paraspinals and QL/Lats                      [facility] Adult PT Treatment/Exercise - 07/29/16 0001      Ambulation/Gait   Ambulation/Gait --  working on slow steps focus on control Lt hip for level Rt    Gait Comments (+) tren[place]burg Lt - noted in standing with lifting Rt LE and with gait pattern      Knee/Hip Exercises: Aerobic   Nustep L5: 6 min      Knee/Hip Exercises: Standing   Hip Abduction Stengthening;Right;Left;10 reps;3 sets  green TB distal thighs   Hip Extension Stengthening;Right;Left;10 reps;3 sets  green TB   Functional Squat 20 reps   SLS 3 trials each leg ~ 15-20 sec focus on level hips    Other Standing Knee Exercises Toe taps to 12" step x 10 reps.  Other Standing Knee Exercises wt shift w/ shallow knee bend then straighten and shift wt to center x 10-15 reps      Knee/Hip Exercises: Supine   Bridges with Clamshell Strengthening;Both;10 reps  green TB      Knee/Hip Exercises: Sidelying   Hip ABduction Strengthening;Right;Left;10 reps;2 sets  weaker Lt    Clams sidelying clams x 20 each manual resistanse LE green TB      Moist Heat Therapy   Number Minutes Moist Heat 20 Minutes   Moist Heat Location Lumbar Spine     Electrical Stimulation   Electrical Stimulation Location bilat lumbar Lt QL   Electrical Stimulation Action IFC   Electrical Stimulation Parameters to tolerance   Electrical Stimulation Goals Pain;Tone     Manual Therapy   Manual therapy comments pt prone    Soft tissue mobilization through lumbar paraspinals; lats; QL bilat Lt > Rt    Myofascial Release bilat lumbar           Trigger Point Dry Needling - 07/29/16 1117    Consent Given? Yes   Muscles Treated Upper Body Longissimus  Lt lumbar x 2    Muscles Treated Lower Body --  QL -  LT x 2    Longissimus Response Palpable increased muscle length                   PT Long Term Goals - 07/29/16 1021      PT LONG TERM GOAL #1   Title Increase strength bilat LE's to 4+/5 to 5/5 throughout 08/12/16   Time 6   Period Weeks   Status On-going     PT LONG TERM GOAL #2   Title Decrease Berg Balance Scale score by 5-8 points indicating lowered risk for falls 08/12/16   Time 6   Period Weeks   Status On-going     PT LONG TERM GOAL #3   Title Improve patient's confidence and safety in preforming ADL's including taking the garbage cans in and out of the house 08/12/16   Time 6   Period Weeks   Status On-going     PT LONG TERM GOAL #4   Title Independent in HEP 08/12/16   Time 6   Period Weeks   Status On-going               Plan - 07/29/16 1119    Clinical Impression Statement Positive response to DN with increased tissue extensibility and little soreness following initial DN treatment. Worked to control Lt hip abductors avoiding Rt hip drop with wt bearing on Lt. Slowed gait with good improvement in LE control. Nancy Harrison will continue to work on this for home. Progressing well toward stated goals of therapy.    Rehab Potential Good   PT Frequency 2x / week   PT Duration 6 weeks   PT Treatment/Interventions Patient/family education;ADLs/Self Care Home Management;Cryotherapy;Electrical Stimulation;Iontophoresis 4mg /ml Dexamethasone;Moist Heat;Ultrasound;Dry needling;Manual techniques;Therapeutic activities;Therapeutic exercise;Balance training;Neuromuscular re-education   PT Next Visit Plan Continue standing static/dynamic balance exercise.  manual work and modalities to address LBP as indicated - assess response to DN    Consulted and Agree with Plan of Care Patient      Patient will benefit from skilled therapeutic intervention in order to improve the following deficits and impairments:  Postural dysfunction, Improper body mechanics, Increased fascial  restricitons, Increased muscle spasms, Decreased range of motion, Decreased mobility, Decreased strength, Decreased activity tolerance, Decreased balance  Visit Diagnosis: Unsteadiness on feet  Weakness generalized  Acute bilateral low back pain without sciatica     Problem List Patient Active Problem List   Diagnosis Date Noted  . History of breast cancer 12/30/2011  . Leukopenia 04/09/2011  . Breast cancer (HCC) 01/01/2011    Grove Defina [name] PT, MPH  07/29/2016, 11:24 AM  [facility] [address] [place] [place] [address] [place], [place], [address] Phone: [phone]   Fax:  [phone]  Name: Nancy Harrison MRN: [id number] Date of Birth: 1940-04-20

## 2016-08-02 ENCOUNTER — Ambulatory Visit (INDEPENDENT_AMBULATORY_CARE_PROVIDER_SITE_OTHER): Payer: Medicare HMO | Admitting: Rehabilitative and Restorative Service Providers"

## 2016-08-02 ENCOUNTER — Encounter: Payer: Self-pay | Admitting: Rehabilitative and Restorative Service Providers"

## 2016-08-02 DIAGNOSIS — R531 Weakness: Secondary | ICD-10-CM

## 2016-08-02 DIAGNOSIS — M545 Low back pain, unspecified: Secondary | ICD-10-CM

## 2016-08-02 DIAGNOSIS — R2681 Unsteadiness on feet: Secondary | ICD-10-CM | POA: Diagnosis not present

## 2016-08-02 NOTE — Therapy (Signed)
[place], [place], [address] Phone: [phone]   Fax:  [phone]  Physical Therapy Treatment  Patient Details  Name: Nancy Harrison MRN: [id number] Date of Birth: 08-Jun-1940 Referring Provider: Dr Hulan Fess   Encounter Date: 08/02/2016      PT End of Session - 08/02/16 1026    Visit Number 8   Number of Visits 12   Date for PT Re-Evaluation 08/12/16   PT Start Time 1016   PT Stop Time 1113   PT Time Calculation (min) 57 min   Activity Tolerance Patient tolerated treatment well      Past Medical History:  Diagnosis Date  . Arthritis   . Cancer ([place]) 1999   rt breast  . Cataract   . Hypertension     Past Surgical History:  Procedure Laterality Date  . BREAST LUMPECTOMY  1999   Chemo and radiation on right breast  . TONSILLECTOMY  1957  . 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      Subjective Assessment - 08/02/16 1027    Subjective Balance is getting marginally better. Not much time to exercise - was traveling over the weekend. Back is feeling a"a little bit better". Can feel stretch in LB with new exercise today.    Currently in Pain? Yes   Pain Score 3    Pain Location Back   Pain Orientation Left   Pain Descriptors / Indicators Dull;Sore   Pain Type Acute pain   Pain Onset More than a month ago   Pain Frequency Intermittent                         [facility] Adult PT Treatment/Exercise - 08/02/16 0001      Ambulation/Gait   Ambulation/Gait --  working on slow steps focus on control Lt hip for level Rt    Ambulation Distance (Feet) 80 Feet  3   Pre-Gait Activities standing wt shift and wt bearing bilat LE's; standing with Rt hip in neutral against surface working on shift to Rt isometrically    Gait Comments work on hip height avoiding [place] Rt hip drop with standing and gait - verbal and tactile  cues of therapist      Knee/Hip Exercises: Aerobic   Nustep L6: 7 min      Knee/Hip Exercises: Standing   Hip Abduction Stengthening;Right;Left;10 reps;3 sets  green TB distal thighs   Hip Extension Stengthening;Right;Left;10 reps;3 sets  green TB   Functional Squat 20 reps   SLS 3 trials each leg ~ 15-20 sec focus on level hips    Other Standing Knee Exercises Toe taps to 12" step x 10 reps.     Other Standing Knee Exercises wt shift w/ shallow knee bend then straighten and shift wt to center x 10-15 reps      Knee/Hip Exercises: Supine   Bridges with Clamshell Strengthening;Both;10 reps  green TB    Other Supine Knee/Hip Exercises clam holding Rt to move Lt and reverse x 10 each LE with greee TB     Knee/Hip Exercises: Sidelying   Clams sidelying clams x 20 each manual resistanse LE green TB      Moist Heat Therapy   Number Minutes Moist Heat 20 Minutes   Moist Heat Location Lumbar Spine     Electrical Stimulation   Electrical Stimulation Location bilat lumbar  Lt QL   Electrical Stimulation Action IFC   Electrical Stimulation Parameters to tolerance   Electrical Stimulation Goals Pain;Tone     Manual Therapy   Manual therapy comments pt prone    Soft tissue mobilization through lumbar paraspinals; lats; QL bilat Lt > Rt    Myofascial Release bilat lumbar           Trigger Point Dry Needling - 08/02/16 1053    Consent Given? Yes   Muscles Treated Upper Body --  Rt   Muscles Treated Lower Body --  QL Lt    Longissimus Response Palpable increased muscle length                   PT Long Term Goals - 07/29/16 1021      PT LONG TERM GOAL #1   Title Increase strength bilat LE's to 4+/5 to 5/5 throughout 08/12/16   Time 6   Period Weeks   Status On-going     PT LONG TERM GOAL #2   Title Decrease Berg Balance Scale score by 5-8 points indicating lowered risk for falls 08/12/16   Time 6   Period Weeks   Status On-going     PT LONG TERM GOAL #3    Title Improve patient's confidence and safety in preforming ADL's including taking the garbage cans in and out of the house 08/12/16   Time 6   Period Weeks   Status On-going     PT LONG TERM GOAL #4   Title Independent in HEP 08/12/16   Time 6   Period Weeks   Status On-going               Plan - 08/02/16 1031    Clinical Impression Statement Patient is working on core stabilization and LE strengthening and reports less pain with walking when she has her core engaged. Positive response to DN - no real soreness and felt looser. Cotninues to have hip weakness Lt > Rt and trendlenburg drop on the Rt in wt bearing. Progressing well toward goals.    Rehab Potential Good   PT Frequency 2x / week   PT Duration 6 weeks   PT Treatment/Interventions Patient/family education;ADLs/Self Care Home Management;Cryotherapy;Electrical Stimulation;Iontophoresis 4mg /ml Dexamethasone;Moist Heat;Ultrasound;Dry needling;Manual techniques;Therapeutic activities;Therapeutic exercise;Balance training;Neuromuscular re-education   PT Next Visit Plan Continue standing static/dynamic balance exercise.  manual work and modalities to address LBP as indicated - assess response to DN    Consulted and Agree with Plan of Care Patient      Patient will benefit from skilled therapeutic intervention in order to improve the following deficits and impairments:  Postural dysfunction, Improper body mechanics, Increased fascial restricitons, Increased muscle spasms, Decreased range of motion, Decreased mobility, Decreased strength, Decreased activity tolerance, Decreased balance  Visit Diagnosis: Unsteadiness on feet  Weakness generalized  Acute bilateral low back pain without sciatica     Problem List Patient Active Problem List   Diagnosis Date Noted  . History of breast cancer 12/30/2011  . Leukopenia 04/09/2011  . Breast cancer ([place]) 01/01/2011    Celyn Nilda Simmer PT, MPH  08/02/2016, 11:16 AM  [facility] [place], [place], [address] Phone: [phone]   Fax:  [phone]  Name: Nancy Harrison MRN: [id number] Date of Birth: [birth year]

## 2016-08-05 ENCOUNTER — Encounter: Payer: Self-pay | Admitting: Rehabilitative and Restorative Service Providers"

## 2016-08-05 ENCOUNTER — Ambulatory Visit (INDEPENDENT_AMBULATORY_CARE_PROVIDER_SITE_OTHER): Payer: Medicare HMO | Admitting: Rehabilitative and Restorative Service Providers"

## 2016-08-05 DIAGNOSIS — E119 Type 2 diabetes mellitus without complications: Secondary | ICD-10-CM | POA: Diagnosis not present

## 2016-08-05 DIAGNOSIS — Z7901 Long term (current) use of anticoagulants: Secondary | ICD-10-CM | POA: Diagnosis not present

## 2016-08-05 DIAGNOSIS — M545 Low back pain, unspecified: Secondary | ICD-10-CM

## 2016-08-05 DIAGNOSIS — R2681 Unsteadiness on feet: Secondary | ICD-10-CM | POA: Diagnosis not present

## 2016-08-05 DIAGNOSIS — K439 Ventral hernia without obstruction or gangrene: Secondary | ICD-10-CM | POA: Diagnosis not present

## 2016-08-05 DIAGNOSIS — E669 Obesity, unspecified: Secondary | ICD-10-CM | POA: Diagnosis not present

## 2016-08-05 DIAGNOSIS — Z903 Acquired absence of stomach [part of]: Secondary | ICD-10-CM | POA: Diagnosis not present

## 2016-08-05 DIAGNOSIS — R531 Weakness: Secondary | ICD-10-CM | POA: Diagnosis not present

## 2016-08-05 DIAGNOSIS — K449 Diaphragmatic hernia without obstruction or gangrene: Secondary | ICD-10-CM | POA: Diagnosis not present

## 2016-08-05 DIAGNOSIS — I10 Essential (primary) hypertension: Secondary | ICD-10-CM | POA: Diagnosis not present

## 2016-08-05 NOTE — Therapy (Signed)
[facility] [place] [address] [place], [place], [address] Phone: [phone]   Fax:  [phone]  Physical Therapy Treatment  Patient Details  Name: Nancy Harrison MRN: [id number] Date of Birth: [birth year] Referring Provider: Dr Catha Gosselin   Encounter Date: 08/05/2016      PT End of Session - 08/05/16 1018    Visit Number 9   Number of Visits 12   Date for PT Re-Evaluation 08/12/16   PT Start Time 1014   PT Stop Time 1111   PT Time Calculation (min) 57 min   Activity Tolerance Patient tolerated treatment well      Past Medical History:  Diagnosis Date  . Arthritis   . 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 Cataract   . Hypertension     Past Surgical History:  Procedure Laterality Date  . BREAST LUMPECTOMY  1999   Chemo and radiation on right breast  . TONSILLECTOMY  1957  . 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      Subjective Assessment - 08/05/16 1019    Subjective [name] reports that her back is less painful. She is taking less medication and no longer using the pain patches. Can tell she is getting stronger. Pleased with progress.    Currently in Pain? Yes   Pain Score 2    Pain Location Back   Pain Orientation Left   Pain Descriptors / Indicators Dull;Sore   Pain Type Acute pain   Pain Onset More than a month ago   Pain Frequency Intermittent                         [facility] Adult PT Treatment/Exercise - 08/05/16 0001      Ambulation/Gait   Ambulation/Gait --  working on slow steps focus on control Lt hip for level Rt    Ambulation Distance (Feet) 80 Feet  3   Pre-Gait Activities standing wt shift/wt bearing activities    Gait Comments work on hip height avoiding [place] Rt hip drop with standing and gait - verbal and tactile cues of therapist working on forward and backward walking      Knee/Hip Exercises: Standing   Hip Abduction  Stengthening;Right;Left;10 reps;3 sets  green TB distal thighs   Hip Extension Stengthening;Right;Left;10 reps;3 sets  green TB   SLS 3 trials each leg ~ 15-20 sec focus on level hips   SLS lifting hip opposite side      Knee/Hip Exercises: Supine   Bridges with Clamshell Strengthening;Both;10 reps  green TB      Knee/Hip Exercises: Sidelying   Other Sidelying Knee/Hip Exercises forward toe taps followed by back toe taps 4 taps x 10-15 reps - to fatigue      Moist Heat Therapy   Number Minutes Moist Heat 20 Minutes   Moist Heat Location Lumbar Spine     Electrical Stimulation   Electrical Stimulation Location bilat lumbar Lt QL   Electrical Stimulation Action IFC   Electrical Stimulation Parameters to tolerance   Electrical Stimulation Goals Pain;Tone     Manual Therapy   Manual therapy comments pt prone    Soft tissue mobilization through lumbar paraspinals; lats; QL bilat Lt > Rt    Myofascial Release bilat lumbar           Trigger Point Dry Needling - 08/05/16 1100    Consent Given?  Yes   Muscles Treated Upper Body --  Lt    Muscles Treated Lower Body --  QL  - Lt    Longissimus Response Palpable increased muscle length                   PT Long Term Goals - 08/05/16 1020      PT LONG TERM GOAL #1   Title Increase strength bilat LE's to 4+/5 to 5/5 throughout 08/12/16   Time 6   Period Weeks   Status On-going     PT LONG TERM GOAL #2   Title Decrease Berg Balance Scale score by 5-8 points indicating lowered risk for falls 08/12/16   Time 6   Period Weeks   Status On-going     PT LONG TERM GOAL #3   Title Improve patient's confidence and safety in preforming ADL's including taking the garbage cans in and out of the house 08/12/16   Time 6   Period Weeks   Status On-going     PT LONG TERM GOAL #4   Title Independent in HEP 08/12/16   Time 6   Period Weeks   Status On-going               Plan - 08/05/16 1022    Clinical Impression  Statement Continues to work on LE strengtheinng with focus on the hip abductors Lt > Rt. Cotninues to work on core stabilization. Good improvement with tissue tightness - responded well to DN and manual work. Working on gait avoiding Rt hip drop w/ [place].    Rehab Potential Good   PT Frequency 2x / week   PT Duration 6 weeks   PT Treatment/Interventions Patient/family education;ADLs/Self Care Home Management;Cryotherapy;Electrical Stimulation;Iontophoresis 4mg /ml Dexamethasone;Moist Heat;Ultrasound;Dry needling;Manual techniques;Therapeutic activities;Therapeutic exercise;Balance training;Neuromuscular re-education   PT Next Visit Plan Continue standing static/dynamic balance exercise.  manual work and modalities to address LBP as indicated - continue DN as indicated      Patient will benefit from skilled therapeutic intervention in order to improve the following deficits and impairments:  Postural dysfunction, Improper body mechanics, Increased fascial restricitons, Increased muscle spasms, Decreased range of motion, Decreased mobility, Decreased strength, Decreased activity tolerance, Decreased balance  Visit Diagnosis: Unsteadiness on feet  Weakness generalized  Acute bilateral low back pain without sciatica     Problem List Patient Active Problem List   Diagnosis Date Noted  . History of breast cancer 12/30/2011  . Leukopenia 04/09/2011  . Breast cancer (HCC) 01/01/2011    Nancy Harrison [name] PT, MPH  08/05/2016, 11:01 AM  [facility] [address] [place], [place], [address] Phone: [phone]   Fax:  [phone]  Name: Nancy Harrison MRN: [id number] Date of Birth: 12/03/1940

## 2016-08-09 ENCOUNTER — Encounter: Payer: Self-pay | Admitting: Rehabilitative and Restorative Service Providers"

## 2016-08-09 ENCOUNTER — Ambulatory Visit (INDEPENDENT_AMBULATORY_CARE_PROVIDER_SITE_OTHER): Payer: Medicare HMO | Admitting: Rehabilitative and Restorative Service Providers"

## 2016-08-09 DIAGNOSIS — R2681 Unsteadiness on feet: Secondary | ICD-10-CM | POA: Diagnosis not present

## 2016-08-09 DIAGNOSIS — R531 Weakness: Secondary | ICD-10-CM

## 2016-08-09 DIAGNOSIS — M545 Low back pain, unspecified: Secondary | ICD-10-CM

## 2016-08-09 NOTE — Therapy (Signed)
[facility] [place] [address] [place], [place], [address] Phone: [phone]   Fax:  [phone]  Physical Therapy Treatment  Patient Details  Name: Nancy Harrison MRN: [id number] Date of Birth: 1940-11-18 Referring Provider: Dr Catha Gosselin   Encounter Date: 08/09/2016      PT End of Session - 08/09/16 1429    Visit Number 10   Number of Visits 12   Date for PT Re-Evaluation 08/12/16   PT Start Time 1428   PT Stop Time 1522   PT Time Calculation (min) 54 min   Activity Tolerance Patient tolerated treatment well      Past Medical History:  Diagnosis Date  . Arthritis   . 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 Cataract   . Hypertension     Past Surgical History:  Procedure Laterality Date  . BREAST LUMPECTOMY  1999   Chemo and radiation on right breast  . TONSILLECTOMY  1957  . 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      Subjective Assessment - 08/09/16 1432    Subjective Rebekan reports improvement overall but has some incresaed discomfort today from driving and sleeping on her husband's new sailboat - where the mattress was not very comfortable. She did not have much time for exercise.    Currently in Pain? Yes   Pain Score 2    Pain Location Back   Pain Orientation Left   Pain Descriptors / Indicators Dull;Sore   Pain Type Acute pain   Pain Onset More than a month ago   Pain Frequency Intermittent   Aggravating Factors  wlaking or changing postions    Pain Relieving Factors medicine; rest; being still             [facility] PT Assessment - 08/09/16 0001      Assessment   Medical Diagnosis Balance deficits; Lt LBP    Referring Provider Dr Catha Gosselin    Onset Date/Surgical Date 06/15/16   Hand Dominance Right   Next MD Visit PRN   Prior Therapy after TKA ~2000     AROM   Lumbar Flexion 85%   Lumbar Extension 55%   Lumbar - Right Side Bend 80%   Lumbar -  Left Side Bend 80%   Lumbar - Right Rotation 45%   Lumbar - Left Rotation 50%     Strength   Right Hip Flexion 4+/5   Right Hip Extension 4+/5   Right Hip ABduction 4+/5   Left Hip Flexion 4+/5   Left Hip Extension 4/5   Left Hip ABduction 4-/5   Right/Left Knee --  5/5 bilat      Flexibility   Hamstrings Rt 75 deg; Lt 75 deg    Quadriceps prone knee flexion: Lt 115 deg, Rt 110 deg.    ITB tight bilat    Piriformis tight bilat     Palpation   Palpation comment tenderness and tightness Lt > Rt lumbar spine through paraspinals and QL/Lats      Ambulation/Gait   Ambulation Distance (Feet) 80 Feet  3 trials    Pre-Gait Activities standing weight shift activities    Gait Comments work on hip height avoiding [place] Rt hip drop with standing and gait - verbal and tactile cues of therapist working on forward and backward walking      [employer]   Sit to Stand Able to stand  without using hands and stabilize independently   Standing Unsupported Able to stand safely 2 minutes   Sitting with Back Unsupported but Feet Supported on Floor or Stool Able to sit safely and securely 2 minutes   Stand to Sit Sits safely with minimal use of hands   Transfers Able to transfer safely, minor use of hands   Standing Unsupported with Eyes Closed Able to stand 10 seconds safely   Standing Ubsupported with Feet Together Able to place feet together independently and stand 1 minute safely   From Standing, Reach Forward with Outstretched Arm Can reach confidently >25 cm (10")   From Standing Position, Pick up Object from Floor Able to pick up shoe safely and easily   From Standing Position, Turn to Look Behind Over each Shoulder Looks behind from both sides and weight shifts well   Turn 360 Degrees Able to turn 360 degrees safely in 4 seconds or less   Standing Unsupported, Alternately Place Feet on Step/Stool Able to stand independently and safely and complete 8 steps in 20 seconds    Standing Unsupported, One Foot in Front Able to place foot tandem independently and hold 30 seconds   Standing on One Leg Able to lift leg independently and hold 5-10 seconds   Total Score 55   Berg comment: low risk of falling                      [facility] Adult PT Treatment/Exercise - 08/09/16 0001      Lumbar Exercises: Standing   Other Standing Lumbar Exercises working on activation of Lt hip abductors with standing activities and walking - verbal and tactile cues      Knee/Hip Exercises: Aerobic   Nustep L5: 6 min      Knee/Hip Exercises: Standing   Hip Abduction Stengthening;Right;Left;10 reps;3 sets  green TB distal thighs   Hip Extension Stengthening;Right;Left;10 reps;3 sets  green TB   SLS 3 trials each leg ~ 15-20 sec focus on level hips   SLS lifting hip opposite side    Other Standing Knee Exercises Toe taps to 12" step x 10 reps.       Knee/Hip Exercises: Seated   Sit to Sand 10 reps  seated hip hike Rt x 10 reps x 2 sets      Knee/Hip Exercises: Supine   Bridges with Clamshell Strengthening;Both;10 reps  blue TB      Knee/Hip Exercises: Sidelying   Clams sidelying clams x 20 each blue TB    Other Sidelying Knee/Hip Exercises forward toe taps followed by back toe taps 4 taps x 10-15 reps - to fatigue      Moist Heat Therapy   Number Minutes Moist Heat 20 Minutes   Moist Heat Location Lumbar Spine     Electrical Stimulation   Electrical Stimulation Location bilat lumbar Lt QL   Electrical Stimulation Action IFC   Electrical Stimulation Parameters to tolerance   Electrical Stimulation Goals Pain;Tone                     PT Long Term Goals - 08/09/16 1456      PT LONG TERM GOAL #1   Title Increase strength bilat LE's to 4+/5 to 5/5 throughout 08/12/16   Time 6   Period Weeks   Status Partially Met     PT LONG TERM GOAL #2   Title Decrease Berg Balance Scale score by 5-8 points indicating lowered risk for falls  08/12/16   Time 6    Period Weeks   Status Achieved     PT LONG TERM GOAL #3   Title Improve patient's confidence and safety in preforming ADL's including taking the garbage cans in and out of the house 08/12/16   Time 6   Period Weeks   Status On-going     PT LONG TERM GOAL #4   Title Independent in HEP 08/12/16   Time 6   Period Weeks   Status On-going               Plan - 08/18/16 1445    Clinical Impression Statement Continued improvement in Berg Balance Score indicateing lower risk for falling. Gradual improvement in LE strength and decrease in muscular tightness and pain in the lumbar area. Progressing well toward stated goals of therpy.    Rehab Potential Good   PT Frequency 2x / week   PT Duration 6 weeks   PT Treatment/Interventions Patient/family education;ADLs/Self Care Home Management;Cryotherapy;Electrical Stimulation;Iontophoresis 4mg /ml Dexamethasone;Moist Heat;Ultrasound;Dry needling;Manual techniques;Therapeutic activities;Therapeutic exercise;Balance training;Neuromuscular re-education   PT Next Visit Plan Continue standing static/dynamic balance exercise.  manual work and modalities to address LBP as indicated - continue DN as indicated   Consulted and Agree with Plan of Care Patient      Patient will benefit from skilled therapeutic intervention in order to improve the following deficits and impairments:  Postural dysfunction, Improper body mechanics, Increased fascial restricitons, Increased muscle spasms, Decreased range of motion, Decreased mobility, Decreased strength, Decreased activity tolerance, Decreased balance  Visit Diagnosis: Unsteadiness on feet  Weakness generalized  Acute bilateral low back pain without sciatica       [facility] PT PB G-CODES - 2016/08/18 1456    Functional Assessment Tool Used  Evaluation; Berg Balance Scale; clinical assessment    Functional Limitations Carrying, moving and handling objects   Changing and Maintaining Body Position Current  Status [id number]) At least 20 percent but less than 40 percent impaired, limited or restricted   Changing and Maintaining Body Position Goal Status (G9562) At least 20 percent but less than 40 percent impaired, limited or restricted      Problem List Patient Active Problem List   Diagnosis Date Noted  . History of breast cancer 12/30/2011  . Leukopenia 04/09/2011  . Breast cancer (HCC) 01/01/2011    Lashaundra Lehrmann [name] PT, MPH  08/18/2016, 3:17 PM  [facility] [address] [place], [place], [address] Phone: [phone]   Fax:  [phone]  Name: Laquishia Gimlin MRN: [id number] Date of Birth: February 10, 1941

## 2016-08-12 ENCOUNTER — Encounter: Payer: Self-pay | Admitting: Rehabilitative and Restorative Service Providers"

## 2016-08-12 ENCOUNTER — Ambulatory Visit (INDEPENDENT_AMBULATORY_CARE_PROVIDER_SITE_OTHER): Payer: Medicare HMO | Admitting: Rehabilitative and Restorative Service Providers"

## 2016-08-12 DIAGNOSIS — R2681 Unsteadiness on feet: Secondary | ICD-10-CM

## 2016-08-12 DIAGNOSIS — M545 Low back pain, unspecified: Secondary | ICD-10-CM

## 2016-08-12 DIAGNOSIS — R531 Weakness: Secondary | ICD-10-CM | POA: Diagnosis not present

## 2016-08-12 NOTE — Therapy (Signed)
[place], [place], [address] Phone: [phone]   Fax:  [phone]  Physical Therapy Treatment  Patient Details  Name: Nancy Harrison MRN: [id number] Date of Birth: 1940/08/02 Referring Provider: Dr Hulan Fess   Encounter Date: 08/12/2016      PT End of Session - 08/12/16 0851    Visit Number 11   Number of Visits 24   Date for PT Re-Evaluation 09/23/16   PT Start Time 0845   PT Stop Time 0941   PT Time Calculation (min) 56 min   Activity Tolerance Patient tolerated treatment well      Past Medical History:  Diagnosis Date  . Arthritis   . Cancer ([place]) 1999   rt breast  . Cataract   . Hypertension     Past Surgical History:  Procedure Laterality Date  . BREAST LUMPECTOMY  1999   Chemo and radiation on right breast  . TONSILLECTOMY  1957  . 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      Subjective Assessment - 08/12/16 0859    Subjective Nancy Harrison states that she has not done her exercises as much as she should this week. She feels she is making progress and would like to continue PT.    Currently in Pain? No/denies                         [facility] Adult PT Treatment/Exercise - 08/12/16 0001      Lumbar Exercises: Standing   Other Standing Lumbar Exercises working on activation of Lt hip abductors with standing activities and walking - verbal and tactile cues    Other Standing Lumbar Exercises ambulation with verbal and tactile cues for activation of the hip      Knee/Hip Exercises: Aerobic   Nustep L5: 8 min      Knee/Hip Exercises: Standing   Hip Abduction Stengthening;Right;Left;10 reps;3 sets  green TB distal thighs   Hip Extension Stengthening;Right;Left;10 reps;3 sets  green TB   SLS 3 trials each leg ~ 15-20 sec focus on level hips   SLS lifting hip opposite side    Walking with Sports Cord backwards x 5 to  patient's ability; side steps x 5;  working isometrically hips to Rt using Lt LE to push    Other Standing Knee Exercises Toe taps to 12" step x 10 reps.       Knee/Hip Exercises: Seated   Sit to Sand 10 reps  seated hip hike Rt x 10 reps x 2 sets      Knee/Hip Exercises: Supine   Bridges with Clamshell Strengthening;Both;10 reps  blue TB      Knee/Hip Exercises: Sidelying   Clams sidelying clams x 20 each blue TB    Other Sidelying Knee/Hip Exercises forward toe taps followed by back toe taps 4 taps x 10-15 reps - to fatigue      Moist Heat Therapy   Number Minutes Moist Heat 20 Minutes   Moist Heat Location Lumbar Spine     Electrical Stimulation   Electrical Stimulation Location bilat lumbar Lt QL   Electrical Stimulation Action IFC   Electrical Stimulation Parameters to tolerance    Electrical Stimulation Goals Pain;Tone                PT Education - 08/12/16 0912    Education provided Yes   Education  Details reviewed HEP; encouraged consistent exercise daily    Person(s) Educated Patient   Methods Explanation;Demonstration;Tactile cues;Verbal cues;Handout   Comprehension Verbalized understanding;Returned demonstration;Verbal cues required;Tactile cues required             PT Long Term Goals - 08/12/16 0853      PT LONG TERM GOAL #1   Title Increase strength bilat LE's to 4+/5 to 5/5 throughout 09/23/16   Time 12   Period Weeks   Status Revised     PT LONG TERM GOAL #2   Title Decrease Berg Balance Scale score by 5-8 points indicating lowered risk for falls 08/12/16   Time 6   Period Weeks   Status Achieved     PT LONG TERM GOAL #3   Title Improve patient's confidence and safety in preforming ADL's including taking the garbage cans in and out of the house 09/23/16   Time 12   Period Weeks   Status Revised     PT LONG TERM GOAL #4   Title Independent in HEP 09/23/16   Time 12               Plan - 08/12/16 0854    Clinical Impression  Statement Improvement noted in Berg Balance Score and LE strength. Gait is improving but remains abnormal with [place] and drop on the Rt with weight bearing on the Lt. Pain in the lumbar area continues but has improved. Patient will benefit from continued PT to address remaining deficits and reach maximum rehab potential.    Rehab Potential Good   PT Frequency 2x / week   PT Duration 6 weeks   PT Treatment/Interventions Patient/family education;ADLs/Self Care Home Management;Cryotherapy;Electrical Stimulation;Iontophoresis 4mg /ml Dexamethasone;Moist Heat;Ultrasound;Dry needling;Manual techniques;Therapeutic activities;Therapeutic exercise;Balance training;Neuromuscular re-education   PT Next Visit Plan Continue standing static/dynamic balance exercise.  manual work and modalities to address LBP as indicated - continue DN as indicated   Consulted and Agree with Plan of Care Patient      Patient will benefit from skilled therapeutic intervention in order to improve the following deficits and impairments:  Postural dysfunction, Improper body mechanics, Increased fascial restricitons, Increased muscle spasms, Decreased range of motion, Decreased mobility, Decreased strength, Decreased activity tolerance, Decreased balance  Visit Diagnosis: Unsteadiness on feet - Plan: PT plan of care cert/re-cert  Weakness generalized - Plan: PT plan of care cert/re-cert  Acute bilateral low back pain without sciatica - Plan: PT plan of care cert/re-cert     Problem List Patient Active Problem List   Diagnosis Date Noted  . History of breast cancer 12/30/2011  . Leukopenia 04/09/2011  . Breast cancer ([place]) 01/01/2011    Nancy Harrison Nancy Harrison PT, MPH  08/12/2016, 9:27 AM  [facility] [place], [place], [address] Phone: [phone]   Fax:  [phone]  Name: Nancy Harrison MRN: [id number] Date of Birth: April 21, 1940

## 2016-08-13 ENCOUNTER — Encounter: Payer: Self-pay | Admitting: Gastroenterology

## 2016-08-16 ENCOUNTER — Ambulatory Visit (INDEPENDENT_AMBULATORY_CARE_PROVIDER_SITE_OTHER): Payer: Medicare HMO | Admitting: Physical Therapy

## 2016-08-16 DIAGNOSIS — R531 Weakness: Secondary | ICD-10-CM

## 2016-08-16 DIAGNOSIS — R2681 Unsteadiness on feet: Secondary | ICD-10-CM

## 2016-08-16 DIAGNOSIS — M545 Low back pain, unspecified: Secondary | ICD-10-CM

## 2016-08-16 NOTE — Therapy (Signed)
[place], [place], [address] Phone: [phone]   Fax:  [phone]  Physical Therapy Treatment  Patient Details  Name: Nancy Harrison MRN: [id number] Date of Birth: 19-Aug-1940 Referring Provider: Dr. Rex Kras   Encounter Date: 08/16/2016      PT End of Session - 08/16/16 1406    Visit Number 12   Number of Visits 24   Date for PT Re-Evaluation 09/23/16   PT Start Time 7782   PT Stop Time 1444   PT Time Calculation (min) 41 min      Past Medical History:  Diagnosis Date  . Arthritis   . Cancer ([place]) 1999   rt breast  . Cataract   . Hypertension     Past Surgical History:  Procedure Laterality Date  . BREAST LUMPECTOMY  1999   Chemo and radiation on right breast  . TONSILLECTOMY  1957  . 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      Subjective Assessment - 08/16/16 1406    Subjective Pt reports she no longer has pain in her back.  "I need to get more strength in my legs". She has not done much exercise due to being busy with traveling to/from/on husband's boat in [place].    Patient Stated Goals to be able to bring in the garbage cans without falling; feel more confident with going up and down stairs; stop falling    Currently in Pain? No/denies            [facility] PT Assessment - 08/16/16 0001      Assessment   Referring Provider Dr. Rex Kras    Onset Date/Surgical Date 06/15/16   Hand Dominance Right   Next MD Visit PRN   Prior Therapy after TKA ~2000          [facility] Adult PT Treatment/Exercise - 08/16/16 0001      Lumbar Exercises: Standing   Other Standing Lumbar Exercises Rt hip hike off of 3" step x 10, then off of scale x 10 - mirror and tactile cues for improved form.      Knee/Hip Exercises: Stretches   Piriformis Stretch Right;Left;30 seconds;3 reps  1 rep in supine, 2 reps in sitting     Knee/Hip Exercises: Aerobic   Nustep L5: 34mi n      Knee/Hip Exercises: Standing   SLS 3 trials each leg ~ 15-20 sec focus on level hips   tactile cues with Lt SLS   Other Standing Knee Exercises Toe taps to 12" step x 10 reps; 2 sets with light occasional UE support        Knee/Hip Exercises: Seated   Sit to Sand 10 reps  seated hip hike Rt x 10 reps. pad under Rt hip     Knee/Hip Exercises: Supine   Bridges with Clamshell Strengthening;Both;10 reps  blue TB    Other Supine Knee/Hip Exercises unilateral clam with blue band x 10 reps each leg, core engaged.      Knee/Hip Exercises: Sidelying   Clams --   Other Sidelying Knee/Hip Exercises forward toe taps followed by back toe taps 2 taps x 8 reps - to fatigue   VC for increased LE abduction in arc     Modalities   Modalities --  pt declined                PT Education - 08/16/16 1437  Education provided Yes   Education Details HEP    Person(s) Educated Patient   Methods Explanation;Handout;Demonstration;Tactile cues   Comprehension Verbalized understanding;Returned demonstration             PT Long Term Goals - 08/16/16 1409      PT LONG TERM GOAL #1   Title Increase strength bilat LE's to 4+/5 to 5/5 throughout 09/23/16   Time 12   Period Weeks   Status On-going     PT LONG TERM GOAL #2   Title Decrease Berg Balance Scale score by 5-8 points indicating lowered risk for falls 08/12/16   Time 6   Period Weeks   Status Achieved     PT LONG TERM GOAL #3   Title Improve patient's confidence and safety in preforming ADL's including taking the garbage cans in and out of the house 09/23/16   Time 12   Period Weeks   Status On-going     PT LONG TERM GOAL #4   Title Independent in HEP 09/23/16   Time 12   Period Weeks   Status On-going               Plan - 08/16/16 1450    Clinical Impression Statement Pt continues with weakness in Lt hip abductors. She tolerated all exercises well without any increase in back symptoms.  She  voiced interest in having HEP exercises she can perform when on her husband's boat.   Progressing towards remaining goals.    Rehab Potential Good   PT Frequency 2x / week   PT Treatment/Interventions Patient/family education;ADLs/Self Care Home Management;Cryotherapy;Electrical Stimulation;Iontophoresis 4mg /ml Dexamethasone;Moist Heat;Ultrasound;Dry needling;Manual techniques;Therapeutic activities;Therapeutic exercise;Balance training;Neuromuscular re-education   PT Next Visit Plan Continue standing static/dynamic balance exercise.  Possible trial of decline/incline work on Treadmill to simulate walking with trash can.    Consulted and Agree with Plan of Care Patient      Patient will benefit from skilled therapeutic intervention in order to improve the following deficits and impairments:  Postural dysfunction, Improper body mechanics, Increased fascial restricitons, Increased muscle spasms, Decreased range of motion, Decreased mobility, Decreased strength, Decreased activity tolerance, Decreased balance  Visit Diagnosis: Unsteadiness on feet  Weakness generalized  Acute bilateral low back pain without sciatica     Problem List Patient Active Problem List   Diagnosis Date Noted  . History of breast cancer 12/30/2011  . Leukopenia 04/09/2011  . Breast cancer ([place]) 01/01/2011   Kerin Perna, PTA 08/16/16 5:10 PM  [facility] [place], [place], [address] Phone: [phone]   Fax:  [phone]  Name: Nancy Harrison MRN: [id number] Date of Birth: [birth year]

## 2016-08-16 NOTE — Patient Instructions (Signed)
Hip Hike    Stand on step, right leg off step, knee straight. Raise unsupported hip, keeping knee straight. Repeat __10__ times per set. Do _2-3___ sets per session. Do __1__ sessions per day.   [facility] [place], [place] [address]  [phone] (office) [phone] (fax)

## 2016-08-19 ENCOUNTER — Encounter: Payer: Self-pay | Admitting: Rehabilitative and Restorative Service Providers"

## 2016-08-26 ENCOUNTER — Ambulatory Visit (INDEPENDENT_AMBULATORY_CARE_PROVIDER_SITE_OTHER): Payer: Medicare HMO | Admitting: Rehabilitative and Restorative Service Providers"

## 2016-08-26 ENCOUNTER — Encounter: Payer: Self-pay | Admitting: Rehabilitative and Restorative Service Providers"

## 2016-08-26 DIAGNOSIS — R531 Weakness: Secondary | ICD-10-CM

## 2016-08-26 DIAGNOSIS — R2681 Unsteadiness on feet: Secondary | ICD-10-CM | POA: Diagnosis not present

## 2016-08-26 DIAGNOSIS — M545 Low back pain, unspecified: Secondary | ICD-10-CM

## 2016-08-26 NOTE — Patient Instructions (Addendum)
Strengthening: Hip Abductor - Resisted sitting     With band looped around both legs above knees, push thighs apart. Hold 5 sec  Repeat __10__ times per set. Do __1-3__ sets per session. Do _1___ sessions per day.   Functional Quadriceps: Sit to Stand    Sit on edge of chair, feet flat on floor. Theraband secured around thighs. Pull knees apart slightly. Stand upright, extending knees fully. Slowly sit back down keeping the band tight. Repeat __10__ times per set. Do __1-2__ sets per session. Do __1__ sessions per day.   Bridging    Secure theraband above knees. Pull knees apart to tense band  Slowly raise buttocks from floor, keeping core tight. Repeat __10__ times per set. Do _1-2___ sets per session. Do __1__ sessions per day.

## 2016-08-26 NOTE — Therapy (Signed)
[facility] [place] [address] [place], [place], [address] Phone: [phone]   Fax:  [phone]  Physical Therapy Treatment  Patient Details  Name: Nancy Harrison MRN: [id number] Date of Birth: 1940/12/25 Referring Provider: Dr Clarene Duke   Encounter Date: 08/26/2016      PT End of Session - 08/26/16 1404    Visit Number 13   Number of Visits 24   Date for PT Re-Evaluation 09/23/16   PT Start Time 1402   PT Stop Time 1457   PT Time Calculation (min) 55 min   Activity Tolerance Patient tolerated treatment well      Past Medical History:  Diagnosis Date  . Arthritis   . 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 Cataract   . Hypertension     Past Surgical History:  Procedure Laterality Date  . BREAST LUMPECTOMY  1999   Chemo and radiation on right breast  . TONSILLECTOMY  1957  . 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      Subjective Assessment - 08/26/16 1406    Subjective Husband had a heart attack last week. She has been at the hospital with her husband. He is now home and improving. Patient has not had a chance to do much much of any kind of exercise.    Currently in Pain? No/denies            [facility] PT Assessment - 08/26/16 0001      Assessment   Medical Diagnosis Balance deficits; Lt LBP    Referring Provider Dr Clarene Duke    Onset Date/Surgical Date 06/15/16   Hand Dominance Right   Next MD Visit PRN   Prior Therapy after TKA ~2000     AROM   Lumbar Flexion 85%   Lumbar Extension 55%   Lumbar - Right Side Bend 80%   Lumbar - Left Side Bend 80%   Lumbar - Right Rotation 45%   Lumbar - Left Rotation 50%     Strength   Right Hip Flexion --  5-/5   Right Hip Extension --  5-/5   Right Hip ABduction 4+/5   Left Hip Flexion 4+/5   Left Hip Extension 4+/5   Left Hip ABduction 4/5     Flexibility   Hamstrings Rt 85 deg; Lt 85 deg    Quadriceps prone knee  flexion: Lt 115 deg, Rt 110 deg.    ITB tight bilat    Piriformis tight bilat     Palpation   Palpation comment tenderness and tightness Lt > Rt lumbar spine through paraspinals and QL/Lats                      [facility] Adult PT Treatment/Exercise - 08/26/16 0001      Lumbar Exercises: Stretches   Passive Hamstring Stretch 2 reps;30 seconds   Piriformis Stretch 2 reps;30 seconds     Lumbar Exercises: Seated   Sit to Stand 10 reps  with blue TB above knees x 10    Sit to Stand Limitations hip abduction with blue TB      Lumbar Exercises: Supine   Clam 10 reps  blue TB x 10 each LE then x10 both    Bridge 10 reps  with blue TB abduction      Lumbar Exercises: Sidelying   Clam 10 reps  blue    Hip Abduction 10  reps   Hip Abduction Limitations tap fwd/tap back x 10 each LE      Knee/Hip Exercises: Aerobic   Nustep L5: 5mi n      Moist Heat Therapy   Number Minutes Moist Heat 20 Minutes   Moist Heat Location Lumbar Spine     Electrical Stimulation   Electrical Stimulation Location bilat lumbar Lt QL   Electrical Stimulation Action IFC   Electrical Stimulation Parameters to tolerance   Electrical Stimulation Goals Pain;Tone                PT Education - 08/26/16 1429    Education provided Yes   Education Details HEP    Person(s) Educated Patient   Methods Explanation;Demonstration;Tactile cues;Verbal cues;Handout   Comprehension Verbalized understanding;Returned demonstration;Verbal cues required;Tactile cues required             PT Long Term Goals - 08/26/16 1405      PT LONG TERM GOAL #1   Title Increase strength bilat LE's to 4+/5 to 5/5 throughout 09/23/16   Time 12   Period Weeks   Status On-going     PT LONG TERM GOAL #2   Title Decrease Berg Balance Scale score by 5-8 points indicating lowered risk for falls 08/12/16   Time 6   Period Weeks   Status On-going     PT LONG TERM GOAL #3   Title Improve patient's confidence and  safety in preforming ADL's including taking the garbage cans in and out of the house 09/23/16   Time 12   Period Weeks   Status On-going     PT LONG TERM GOAL #4   Title Independent in HEP 09/23/16   Time 12   Period Weeks   Status On-going               Plan - 08/26/16 1409    Clinical Impression Statement Less opportunity to exercise due to husband being in the hospital with MI. Has a busy week ahead - family reunion out of town. She continues to have LE weakness and gait abnormality but that is improving. The pain has decresaed as well. Patient will benefit from continued therapy to reach maximum therapeutic level. patient will be out of town for he next two weeks. She will call to schedule.   Rehab Potential Good   PT Frequency 2x / week   PT Duration 6 weeks   PT Treatment/Interventions Patient/family education;ADLs/Self Care Home Management;Cryotherapy;Electrical Stimulation;Iontophoresis 4mg /ml Dexamethasone;Moist Heat;Ultrasound;Dry needling;Manual techniques;Therapeutic activities;Therapeutic exercise;Balance training;Neuromuscular re-education   PT Next Visit Plan Continue standing static/dynamic balance exercise.  Possible trial of decline/incline work on Treadmill to simulate walking with trash can. Patient will be out of town for two weeks - will assess progress without PT and call to schedule as needed.   Consulted and Agree with Plan of Care Patient      Patient will benefit from skilled therapeutic intervention in order to improve the following deficits and impairments:  Postural dysfunction, Improper body mechanics, Increased fascial restricitons, Increased muscle spasms, Decreased range of motion, Decreased mobility, Decreased strength, Decreased activity tolerance, Decreased balance  Visit Diagnosis: Unsteadiness on feet  Weakness generalized  Acute bilateral low back pain without sciatica     Problem List Patient Active Problem List   Diagnosis Date Noted   . History of breast cancer 12/30/2011  . Leukopenia 04/09/2011  . Breast cancer (HCC) 01/01/2011    Coila Wardell [name] PT, MPH  08/26/2016, 2:46 PM  [place]  [facility] [place] [address] [place], [place], [address] Phone: [phone]   Fax:  [phone]  Name: Nancy Harrison MRN: [id number] Date of Birth: Apr 15, 1940

## 2016-09-16 ENCOUNTER — Ambulatory Visit (INDEPENDENT_AMBULATORY_CARE_PROVIDER_SITE_OTHER): Payer: Medicare HMO | Admitting: Rehabilitative and Restorative Service Providers"

## 2016-09-16 ENCOUNTER — Encounter: Payer: Self-pay | Admitting: Rehabilitative and Restorative Service Providers"

## 2016-09-16 DIAGNOSIS — R531 Weakness: Secondary | ICD-10-CM

## 2016-09-16 DIAGNOSIS — M545 Low back pain, unspecified: Secondary | ICD-10-CM

## 2016-09-16 DIAGNOSIS — R2681 Unsteadiness on feet: Secondary | ICD-10-CM

## 2016-09-16 NOTE — Therapy (Addendum)
[facility] [place] [address] [place], [place], [address] Phone: [phone]   Fax:  [phone]  Physical Therapy Treatment  Patient Details  Name: Nancy Harrison MRN: [id number] Date of Birth: 07-09-1940 Referring Provider: Dr Clarene Duke  Encounter Date: 09/16/2016    Past Medical History:  Diagnosis Date  . Arthritis   . 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 Cataract   . Hypertension     Past Surgical History:  Procedure Laterality Date  . BREAST LUMPECTOMY  1999   Chemo and radiation on right breast  . TONSILLECTOMY  1957  . 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      Subjective Assessment - 09/16/16 1409    Subjective A little bit of pain if she stands without moving for a length of time. She is not taking antiinflammatory medicine now.    Currently in Pain? No/denies            [facility] PT Assessment - 09/16/16 0001      Assessment   Medical Diagnosis Balance deficits; Lt LBP    Referring Provider Dr Clarene Duke   Onset Date/Surgical Date 06/15/16   Hand Dominance Right   Next MD Visit PRN   Prior Therapy after TKA ~2000     AROM   Lumbar Flexion 85%   Lumbar Extension 60%   Lumbar - Right Side Bend 80%   Lumbar - Left Side Bend 80%   Lumbar - Right Rotation 50%   Lumbar - Left Rotation 50%     Strength   Right Hip Flexion --  5-/5   Right Hip Extension --  5-/5   Right Hip ABduction --  5-/5   Left Hip Flexion --  5-/5   Left Hip Extension --  5-/5   Left Hip ABduction 4+/5     Flexibility   Hamstrings Rt 85 deg; Lt 85 deg    ITB tight bilat    Piriformis tight bilat     Palpation   Palpation comment decreased tenderness and tightness Lt > Rt lumbar spine through paraspinals and QL/Lats      Berg Balance Test   Sit to Stand Able to stand without using hands and stabilize independently   Standing Unsupported Able to stand safely 2 minutes    Sitting with Back Unsupported but Feet Supported on Floor or Stool Able to sit safely and securely 2 minutes   Stand to Sit Sits safely with minimal use of hands   Transfers Able to transfer safely, minor use of hands   Standing Unsupported with Eyes Closed Able to stand 10 seconds safely   Standing Ubsupported with Feet Together Able to place feet together independently and stand 1 minute safely   From Standing, Reach Forward with Outstretched Arm Can reach confidently >25 cm (10")   From Standing Position, Pick up Object from Floor Able to pick up shoe safely and easily   From Standing Position, Turn to Look Behind Over each Shoulder Looks behind from both sides and weight shifts well   Turn 360 Degrees Able to turn 360 degrees safely in 4 seconds or less   Standing Unsupported, Alternately Place Feet on Step/Stool Able to stand independently and safely and complete 8 steps in 20 seconds   Standing Unsupported, One Foot in Front Able to place foot tandem independently and hold 30 seconds   Standing on One Leg Able  to lift leg independently and hold 5-10 seconds   Total Score 55   Berg comment: low risk of falling                      [facility] Adult PT Treatment/Exercise - 10-14-16 0001      Knee/Hip Exercises: Standing   Hip Abduction Stengthening;Right;Left;10 reps;3 sets  black theraband   Hip Extension Stengthening;Right;Left;10 reps;3 sets  black theraband    Forward Step Up Step Height: 6"   SLS 3 trials each leg ~ 15-20 sec focus on level hips      Knee/Hip Exercises: Supine   Bridges with Clamshell Strengthening;Both;10 reps  black TB   Other Supine Knee/Hip Exercises unilateral clam with black band x 10 reps each leg, core engaged.      Knee/Hip Exercises: Sidelying   Clams sidelying clams x 20 each black TB                 PT Education - Oct 14, 2016 1444    Education provided Yes   Education Details HEP   Person(s) Educated Patient   Methods  Explanation;Demonstration;Tactile cues;Verbal cues;Handout   Comprehension Verbalized understanding;Returned demonstration;Verbal cues required;Tactile cues required             PT Long Term Goals - 10/14/2016 1416      PT LONG TERM GOAL #1   Title Increase strength bilat LE's to 4+/5 to 5/5 throughout 09/23/16   Time 12   Period Weeks   Status Achieved     PT LONG TERM GOAL #2   Title Decrease Berg Balance Scale score by 5-8 points indicating lowered risk for falls 08/12/16   Time 6   Period Weeks   Status Achieved     PT LONG TERM GOAL #3   Title Improve patient's confidence and safety in preforming ADL's including taking the garbage cans in and out of the house 09/23/16   Time 12   Period Weeks   Status On-going     PT LONG TERM GOAL #4   Title Independent in HEP 09/23/16   Time 12   Period Weeks   Status Achieved               Plan - 10/14/2016 1415    Clinical Impression Statement Good improvement in pain level. Increasing strength and stability bilat LE's. Gait pattern is improving and patient has resumed most normal functional activities.    Rehab Potential Good   PT Frequency 2x / week   PT Duration 6 weeks   PT Treatment/Interventions Patient/family education;ADLs/Self Care Home Management;Cryotherapy;Electrical Stimulation;Iontophoresis 4mg /ml Dexamethasone;Moist Heat;Ultrasound;Dry needling;Manual techniques;Therapeutic activities;Therapeutic exercise;Balance training;Neuromuscular re-education   PT Next Visit Plan Continue standing static/dynamic balance exercise.  Possible trial of decline/incline work on Treadmill to simulate walking with trash can.    Consulted and Agree with Plan of Care Patient      Patient will benefit from skilled therapeutic intervention in order to improve the following deficits and impairments:  Postural dysfunction, Improper body mechanics, Increased fascial restricitons, Increased muscle spasms, Decreased range of motion, Decreased  mobility, Decreased strength, Decreased activity tolerance, Decreased balance  Visit Diagnosis: Unsteadiness on feet  Weakness generalized  Acute bilateral low back pain without sciatica       [facility] PT PB G-CODES - 10/14/2016 1445    Functional Assessment Tool Used  Evaluation; Berg Balance Scale; clinical assessment    Functional Limitations Changing and maintaining body position   Changing and Maintaining Body  Position Current Status [phone]) At least 1 percent but less than 20 percent impaired, limited or restricted   Changing and Maintaining Body Position Goal Status (L2440) At least 20 percent but less than 40 percent impaired, limited or restricted   Changing and Maintaining Body Position Discharge Status (N0272) At least 1 percent but less than 20 percent impaired, limited or restricted      Problem List Patient Active Problem List   Diagnosis Date Noted  . History of breast cancer 12/30/2011  . Leukopenia 04/09/2011  . Breast cancer (HCC) 01/01/2011    Arshia Rondon [name] PT, MPH  09/16/2016, 2:49 PM  [facility] [address] [place], [place], [address] Phone: [phone]   Fax:  [phone]  Name: Shoshana Brice MRN: [id number] Date of Birth: 06-07-1940  PHYSICAL THERAPY DISCHARGE SUMMARY  Visits from Start of Care: 13  Current functional level related to goals / functional outcomes: See last progress note for discharge status   Remaining deficits: Unknown    Education / Equipment: HEP  Plan: Patient agrees to discharge.  Patient goals were partially met. Patient is being discharged due to being pleased with the current functional level.  ?????    Jasman Pfeifle P. Leonor Liv PT, MPH 04/06/17 2:39 PM  '

## 2016-09-16 NOTE — Patient Instructions (Addendum)
Lateral Step Up    Stand to side of step. Step up leading with left leg. Slowly step down leading with opposite leg. Perform _20__ reps.   Forward    Facing step, place one leg on step, flexed at hip. Step up slowly, bringing hips in line with knee and shoulder. Bring other foot onto step. Reverse process to step back down. Repeat with other leg. Do __20__ repetitions, _1-2___ sets.  Balance: Unilateral    Attempt to balance on left leg, eyes open. Hold __20-30__ seconds. Repeat __3-5__ times per set.  Do __2-3__ sessions per day. Perform exercise with eyes closed.

## 2016-09-22 ENCOUNTER — Encounter: Payer: Self-pay | Admitting: Gastroenterology

## 2016-09-22 ENCOUNTER — Ambulatory Visit (AMBULATORY_SURGERY_CENTER): Payer: Self-pay | Admitting: *Deleted

## 2016-09-22 VITALS — Ht 59.25 in | Wt 148.6 lb

## 2016-09-22 DIAGNOSIS — Z86010 Personal history of colonic polyps: Secondary | ICD-10-CM

## 2016-09-22 MED ORDER — NA SULFATE-K SULFATE-MG SULF 17.5-3.13-1.6 GM/177ML PO SOLN: ORAL | 0 refills | Status: DC

## 2016-09-22 NOTE — Progress Notes (Signed)
No allergies to eggs or soy. No problems with anesthesia.  Pt given Emmi instructions for colonoscopy  No oxygen use  No diet drug use  

## 2016-10-06 ENCOUNTER — Ambulatory Visit (AMBULATORY_SURGERY_CENTER): Payer: Medicare HMO | Admitting: Gastroenterology

## 2016-10-06 ENCOUNTER — Encounter: Payer: Self-pay | Admitting: Gastroenterology

## 2016-10-06 VITALS — BP 150/80 | HR 66 | Temp 97.8°F | Resp 17 | Ht 59.0 in | Wt 156.0 lb

## 2016-10-06 DIAGNOSIS — K635 Polyp of colon: Secondary | ICD-10-CM | POA: Diagnosis not present

## 2016-10-06 DIAGNOSIS — D125 Benign neoplasm of sigmoid colon: Secondary | ICD-10-CM

## 2016-10-06 DIAGNOSIS — Z1211 Encounter for screening for malignant neoplasm of colon: Secondary | ICD-10-CM | POA: Diagnosis not present

## 2016-10-06 DIAGNOSIS — Z86010 Personal history of colonic polyps: Secondary | ICD-10-CM

## 2016-10-06 MED ORDER — SODIUM CHLORIDE 0.9 % IV SOLN: 500.0000 mL | INTRAVENOUS | Status: DC

## 2016-10-06 NOTE — Op Note (Signed)
[place] Patient Name: Nancy Harrison Procedure Date: 10/06/2016 7:14 AM MRN: [id number] Endoscopist: Mauri Pole , MD Age: 76 Referring MD:  Date of Birth: 1940/09/02 Gender: Female Account #: [id number] Procedure:                Colonoscopy Indications:              Screening in patient at increased risk: Family                            history of 1st-degree relative with colorectal                            cancer before age 45 years, High risk colon cancer                            surveillance: Personal history of adenoma less than                            10 mm in size, Last colonoscopy: 2013 Medicines:                Monitored Anesthesia Care Procedure:                Pre-Anesthesia Assessment:                           - Prior to the procedure, a History and Physical                            was performed, and patient medications and                            allergies were reviewed. The patient's tolerance of                            previous anesthesia was also reviewed. The risks                            and benefits of the procedure and the sedation                            options and risks were discussed with the patient.                            All questions were answered, and informed consent                            was obtained. Prior Anticoagulants: The patient has                            taken no previous anticoagulant or antiplatelet                            agents. ASA Grade Assessment: II - A patient with  mild systemic disease. After reviewing the risks                            and benefits, the patient was deemed in                            satisfactory condition to undergo the procedure.                           After obtaining informed consent, the colonoscope                            was passed under direct vision. Throughout the                            procedure, the patient's  blood pressure, pulse, and                            oxygen saturations were monitored continuously. The                            Colonoscope was introduced through the anus and                            advanced to the the terminal ileum, with                            identification of the appendiceal orifice and IC                            valve. The colonoscopy was performed without                            difficulty. The patient tolerated the procedure                            well. The quality of the bowel preparation was                            excellent. The terminal ileum, ileocecal valve,                            appendiceal orifice, and rectum were photographed. Scope In: 8:15:08 AM Scope Out: 8:37:13 AM Scope Withdrawal Time: 0 hours 12 minutes 20 seconds  Total Procedure Duration: 0 hours 22 minutes 5 seconds  Findings:                 A 2 mm polyp was found in the sigmoid colon. The                            polyp was sessile. The polyp was removed with a                            cold biopsy forceps. Resection  and retrieval were                            complete.                           Multiple small and large-mouthed diverticula were                            found in the sigmoid colon and descending colon.                           Non-bleeding internal hemorrhoids were found during                            retroflexion. The hemorrhoids were small.                           The exam was otherwise without abnormality. Complications:            No immediate complications. Estimated Blood Loss:     Estimated blood loss was minimal. Impression:               - One 2 mm polyp in the sigmoid colon, removed with                            a cold biopsy forceps. Resected and retrieved.                           - Diverticulosis in the sigmoid colon and in the                            descending colon.                           - Non-bleeding internal  hemorrhoids.                           - The examination was otherwise normal. Recommendation:           - Patient has a contact number available for                            emergencies. The signs and symptoms of potential                            delayed complications were discussed with the                            patient. Return to normal activities tomorrow.                            Written discharge instructions were provided to the                            patient.                           -  Resume previous diet.                           - Continue present medications.                           - Await pathology results.                           - Repeat colonoscopy in 5 years for surveillance                            based on pathology results. Mauri Pole, MD 10/06/2016 8:42:33 AM This report has been signed electronically.

## 2016-10-06 NOTE — Patient Instructions (Signed)
YOU HAD AN ENDOSCOPIC PROCEDURE TODAY AT THE [facility]:   Refer to the procedure report that was given to you for any specific questions about what was found during the examination.  If the procedure report does not answer your questions, please call your gastroenterologist to clarify.  If you requested that your care partner not be given the details of your procedure findings, then the procedure report has been included in a sealed envelope for you to review at your convenience later.  YOU SHOULD EXPECT: Some feelings of bloating in the abdomen. Passage of more gas than usual.  Walking can help get rid of the air that was put into your GI tract during the procedure and reduce the bloating. If you had a lower endoscopy (such as a colonoscopy or flexible sigmoidoscopy) you may notice spotting of blood in your stool or on the toilet paper. If you underwent a bowel prep for your procedure, you may not have a normal bowel movement for a few days.  Please Note:  You might notice some irritation and congestion in your nose or some drainage.  This is from the oxygen used during your procedure.  There is no need for concern and it should clear up in a day or so.  SYMPTOMS TO REPORT IMMEDIATELY:   Following lower endoscopy (colonoscopy or flexible sigmoidoscopy):  Excessive amounts of blood in the stool  Significant tenderness or worsening of abdominal pains  Swelling of the abdomen that is new, acute  Fever of 100F or higher  For urgent or emergent issues, a gastroenterologist can be reached at any hour by calling [phone].   DIET:  We do recommend a small meal at first, but then you may proceed to your regular diet.  Drink plenty of fluids but you should avoid alcoholic beverages for 24 hours.  ACTIVITY:  You should plan to take it easy for the rest of today and you should NOT DRIVE or use heavy machinery until tomorrow (because of the sedation medicines used during the test).     FOLLOW UP: Our staff will call the number listed on your records the next business day following your procedure to check on you and address any questions or concerns that you may have regarding the information given to you following your procedure. If we do not reach you, we will leave a message.  However, if you are feeling well and you are not experiencing any problems, there is no need to return our call.  We will assume that you have returned to your regular daily activities without incident.  If any biopsies were taken you will be contacted by phone or by letter within the next 1-3 weeks.  Please call us at [phone] if you have not heard about the biopsies in 3 weeks.   Await for biopsy results to determine next repeat Colonoscopy screening Hemorrhoids (handout given) Polyps (handout given) Diverticulosis(handout given)   SIGNATURES/CONFIDENTIALITY: You and/or your care partner have signed paperwork which will be entered into your electronic medical record.  These signatures attest to the fact that that the information above on your After Visit Summary has been reviewed and is understood.  Full responsibility of the confidentiality of this discharge information lies with you and/or your care-partner. 

## 2016-10-06 NOTE — Progress Notes (Signed)
A and O x3. Report to RN. Tolerated MAC anesthesia well.

## 2016-10-06 NOTE — Progress Notes (Signed)
Called to room to assist during endoscopic procedure.  Patient ID and intended procedure confirmed with present staff. Received instructions for my participation in the procedure from the performing physician.  

## 2016-10-06 NOTE — Progress Notes (Signed)
Pt's states no medical or surgical changes since previsit or office visit. 

## 2016-10-07 ENCOUNTER — Telehealth: Payer: Self-pay

## 2016-10-07 ENCOUNTER — Telehealth: Payer: Self-pay | Admitting: *Deleted

## 2016-10-07 NOTE — Telephone Encounter (Signed)
No answer. Unable to leave message.

## 2016-10-07 NOTE — Telephone Encounter (Signed)
Called [phone] and left a messaged we tried to reach pt for a follow up call. maw

## 2016-10-16 ENCOUNTER — Encounter: Payer: Self-pay | Admitting: Gastroenterology

## 2016-11-25 DIAGNOSIS — Z6831 Body mass index (BMI) 31.0-31.9, adult: Secondary | ICD-10-CM | POA: Diagnosis not present

## 2016-11-25 DIAGNOSIS — Z853 Personal history of malignant neoplasm of breast: Secondary | ICD-10-CM | POA: Diagnosis not present

## 2016-11-25 DIAGNOSIS — I10 Essential (primary) hypertension: Secondary | ICD-10-CM | POA: Diagnosis not present

## 2016-11-25 DIAGNOSIS — Z86010 Personal history of colonic polyps: Secondary | ICD-10-CM | POA: Diagnosis not present

## 2016-11-25 DIAGNOSIS — M899 Disorder of bone, unspecified: Secondary | ICD-10-CM | POA: Diagnosis not present

## 2016-11-25 DIAGNOSIS — E669 Obesity, unspecified: Secondary | ICD-10-CM | POA: Diagnosis not present

## 2016-11-25 DIAGNOSIS — M85832 Other specified disorders of bone density and structure, left forearm: Secondary | ICD-10-CM | POA: Diagnosis not present

## 2016-11-25 DIAGNOSIS — E7800 Pure hypercholesterolemia, unspecified: Secondary | ICD-10-CM | POA: Diagnosis not present

## 2016-11-25 DIAGNOSIS — Z8639 Personal history of other endocrine, nutritional and metabolic disease: Secondary | ICD-10-CM | POA: Diagnosis not present

## 2016-11-25 DIAGNOSIS — Z0000 Encounter for general adult medical examination without abnormal findings: Secondary | ICD-10-CM | POA: Diagnosis not present

## 2017-03-23 DIAGNOSIS — Z961 Presence of intraocular lens: Secondary | ICD-10-CM | POA: Diagnosis not present

## 2017-03-23 DIAGNOSIS — H524 Presbyopia: Secondary | ICD-10-CM | POA: Diagnosis not present

## 2017-04-01 DIAGNOSIS — Z853 Personal history of malignant neoplasm of breast: Secondary | ICD-10-CM | POA: Diagnosis not present

## 2017-04-01 DIAGNOSIS — Z1231 Encounter for screening mammogram for malignant neoplasm of breast: Secondary | ICD-10-CM | POA: Diagnosis not present

## 2017-05-19 DIAGNOSIS — R350 Frequency of micturition: Secondary | ICD-10-CM | POA: Diagnosis not present

## 2017-06-09 DIAGNOSIS — R350 Frequency of micturition: Secondary | ICD-10-CM | POA: Diagnosis not present

## 2017-08-11 DIAGNOSIS — R350 Frequency of micturition: Secondary | ICD-10-CM | POA: Diagnosis not present

## 2017-08-11 DIAGNOSIS — R300 Dysuria: Secondary | ICD-10-CM | POA: Diagnosis not present

## 2017-10-04 DIAGNOSIS — Z01419 Encounter for gynecological examination (general) (routine) without abnormal findings: Secondary | ICD-10-CM | POA: Diagnosis not present

## 2017-10-04 DIAGNOSIS — N6314 Unspecified lump in the right breast, lower inner quadrant: Secondary | ICD-10-CM | POA: Diagnosis not present

## 2017-10-04 DIAGNOSIS — L989 Disorder of the skin and subcutaneous tissue, unspecified: Secondary | ICD-10-CM | POA: Diagnosis not present

## 2017-10-10 DIAGNOSIS — N6011 Diffuse cystic mastopathy of right breast: Secondary | ICD-10-CM | POA: Diagnosis not present

## 2017-11-15 ENCOUNTER — Other Ambulatory Visit: Payer: Self-pay | Admitting: Obstetrics and Gynecology

## 2017-11-15 DIAGNOSIS — L820 Inflamed seborrheic keratosis: Secondary | ICD-10-CM | POA: Diagnosis not present

## 2017-12-14 DIAGNOSIS — R69 Illness, unspecified: Secondary | ICD-10-CM | POA: Diagnosis not present

## 2017-12-15 DIAGNOSIS — Z853 Personal history of malignant neoplasm of breast: Secondary | ICD-10-CM | POA: Diagnosis not present

## 2017-12-15 DIAGNOSIS — Z86010 Personal history of colonic polyps: Secondary | ICD-10-CM | POA: Diagnosis not present

## 2017-12-15 DIAGNOSIS — Z8639 Personal history of other endocrine, nutritional and metabolic disease: Secondary | ICD-10-CM | POA: Diagnosis not present

## 2017-12-15 DIAGNOSIS — Z0000 Encounter for general adult medical examination without abnormal findings: Secondary | ICD-10-CM | POA: Diagnosis not present

## 2017-12-15 DIAGNOSIS — E7800 Pure hypercholesterolemia, unspecified: Secondary | ICD-10-CM | POA: Diagnosis not present

## 2017-12-15 DIAGNOSIS — L989 Disorder of the skin and subcutaneous tissue, unspecified: Secondary | ICD-10-CM | POA: Diagnosis not present

## 2017-12-15 DIAGNOSIS — I10 Essential (primary) hypertension: Secondary | ICD-10-CM | POA: Diagnosis not present

## 2017-12-15 DIAGNOSIS — Z1389 Encounter for screening for other disorder: Secondary | ICD-10-CM | POA: Diagnosis not present

## 2017-12-20 DIAGNOSIS — R350 Frequency of micturition: Secondary | ICD-10-CM | POA: Diagnosis not present

## 2018-03-24 DIAGNOSIS — H524 Presbyopia: Secondary | ICD-10-CM | POA: Diagnosis not present

## 2018-03-24 DIAGNOSIS — I10 Essential (primary) hypertension: Secondary | ICD-10-CM | POA: Diagnosis not present

## 2018-04-07 DIAGNOSIS — Z1231 Encounter for screening mammogram for malignant neoplasm of breast: Secondary | ICD-10-CM | POA: Diagnosis not present

## 2018-04-07 DIAGNOSIS — Z853 Personal history of malignant neoplasm of breast: Secondary | ICD-10-CM | POA: Diagnosis not present

## 2018-04-21 DIAGNOSIS — R350 Frequency of micturition: Secondary | ICD-10-CM | POA: Diagnosis not present

## 2018-04-21 DIAGNOSIS — R300 Dysuria: Secondary | ICD-10-CM | POA: Diagnosis not present

## 2018-10-24 DIAGNOSIS — R300 Dysuria: Secondary | ICD-10-CM | POA: Diagnosis not present

## 2018-10-24 DIAGNOSIS — R350 Frequency of micturition: Secondary | ICD-10-CM | POA: Diagnosis not present

## 2018-11-28 DIAGNOSIS — R69 Illness, unspecified: Secondary | ICD-10-CM | POA: Diagnosis not present

## 2019-03-14 DIAGNOSIS — R899 Unspecified abnormal finding in specimens from other organs, systems and tissues: Secondary | ICD-10-CM | POA: Diagnosis not present

## 2019-03-14 DIAGNOSIS — I10 Essential (primary) hypertension: Secondary | ICD-10-CM | POA: Diagnosis not present

## 2019-03-14 DIAGNOSIS — Z8639 Personal history of other endocrine, nutritional and metabolic disease: Secondary | ICD-10-CM | POA: Diagnosis not present

## 2019-03-14 DIAGNOSIS — Z86010 Personal history of colonic polyps: Secondary | ICD-10-CM | POA: Diagnosis not present

## 2019-03-14 DIAGNOSIS — Z0000 Encounter for general adult medical examination without abnormal findings: Secondary | ICD-10-CM | POA: Diagnosis not present

## 2019-03-14 DIAGNOSIS — M85839 Other specified disorders of bone density and structure, unspecified forearm: Secondary | ICD-10-CM | POA: Diagnosis not present

## 2019-03-14 DIAGNOSIS — D7589 Other specified diseases of blood and blood-forming organs: Secondary | ICD-10-CM | POA: Diagnosis not present

## 2019-03-14 DIAGNOSIS — Z853 Personal history of malignant neoplasm of breast: Secondary | ICD-10-CM | POA: Diagnosis not present

## 2019-03-30 DIAGNOSIS — I10 Essential (primary) hypertension: Secondary | ICD-10-CM | POA: Diagnosis not present

## 2019-03-30 DIAGNOSIS — R899 Unspecified abnormal finding in specimens from other organs, systems and tissues: Secondary | ICD-10-CM | POA: Diagnosis not present

## 2019-03-30 DIAGNOSIS — D7589 Other specified diseases of blood and blood-forming organs: Secondary | ICD-10-CM | POA: Diagnosis not present

## 2019-03-30 DIAGNOSIS — Z853 Personal history of malignant neoplasm of breast: Secondary | ICD-10-CM | POA: Diagnosis not present

## 2019-03-30 DIAGNOSIS — Z8639 Personal history of other endocrine, nutritional and metabolic disease: Secondary | ICD-10-CM | POA: Diagnosis not present

## 2019-03-30 DIAGNOSIS — Z0000 Encounter for general adult medical examination without abnormal findings: Secondary | ICD-10-CM | POA: Diagnosis not present

## 2019-03-30 DIAGNOSIS — Z86010 Personal history of colonic polyps: Secondary | ICD-10-CM | POA: Diagnosis not present

## 2019-03-30 DIAGNOSIS — M85839 Other specified disorders of bone density and structure, unspecified forearm: Secondary | ICD-10-CM | POA: Diagnosis not present

## 2019-04-09 DIAGNOSIS — H5212 Myopia, left eye: Secondary | ICD-10-CM | POA: Diagnosis not present

## 2019-05-03 DIAGNOSIS — R350 Frequency of micturition: Secondary | ICD-10-CM | POA: Diagnosis not present

## 2019-05-03 DIAGNOSIS — R300 Dysuria: Secondary | ICD-10-CM | POA: Diagnosis not present

## 2019-05-15 DIAGNOSIS — Z1231 Encounter for screening mammogram for malignant neoplasm of breast: Secondary | ICD-10-CM | POA: Diagnosis not present

## 2019-08-29 DIAGNOSIS — R69 Illness, unspecified: Secondary | ICD-10-CM | POA: Diagnosis not present

## 2019-11-22 DIAGNOSIS — R350 Frequency of micturition: Secondary | ICD-10-CM | POA: Diagnosis not present

## 2019-11-22 DIAGNOSIS — N3941 Urge incontinence: Secondary | ICD-10-CM | POA: Diagnosis not present

## 2019-11-30 DIAGNOSIS — R69 Illness, unspecified: Secondary | ICD-10-CM | POA: Diagnosis not present

## 2019-12-24 DIAGNOSIS — R350 Frequency of micturition: Secondary | ICD-10-CM | POA: Diagnosis not present

## 2019-12-24 DIAGNOSIS — N3941 Urge incontinence: Secondary | ICD-10-CM | POA: Diagnosis not present

## 2020-02-04 DIAGNOSIS — N3941 Urge incontinence: Secondary | ICD-10-CM | POA: Diagnosis not present

## 2020-03-05 DIAGNOSIS — R197 Diarrhea, unspecified: Secondary | ICD-10-CM | POA: Diagnosis not present

## 2020-03-06 DIAGNOSIS — R197 Diarrhea, unspecified: Secondary | ICD-10-CM | POA: Diagnosis not present

## 2020-04-17 DIAGNOSIS — H5203 Hypermetropia, bilateral: Secondary | ICD-10-CM | POA: Diagnosis not present

## 2020-04-17 DIAGNOSIS — Z8639 Personal history of other endocrine, nutritional and metabolic disease: Secondary | ICD-10-CM | POA: Diagnosis not present

## 2020-04-17 DIAGNOSIS — I10 Essential (primary) hypertension: Secondary | ICD-10-CM | POA: Diagnosis not present

## 2020-04-17 DIAGNOSIS — R718 Other abnormality of red blood cells: Secondary | ICD-10-CM | POA: Diagnosis not present

## 2020-05-20 DIAGNOSIS — Z1231 Encounter for screening mammogram for malignant neoplasm of breast: Secondary | ICD-10-CM | POA: Diagnosis not present

## 2020-08-08 DIAGNOSIS — N3941 Urge incontinence: Secondary | ICD-10-CM | POA: Diagnosis not present

## 2020-08-08 DIAGNOSIS — R300 Dysuria: Secondary | ICD-10-CM | POA: Diagnosis not present

## 2021-02-06 DIAGNOSIS — R350 Frequency of micturition: Secondary | ICD-10-CM | POA: Diagnosis not present

## 2021-02-06 DIAGNOSIS — N3941 Urge incontinence: Secondary | ICD-10-CM | POA: Diagnosis not present

## 2021-04-20 DIAGNOSIS — Z961 Presence of intraocular lens: Secondary | ICD-10-CM | POA: Diagnosis not present

## 2021-04-20 DIAGNOSIS — H524 Presbyopia: Secondary | ICD-10-CM | POA: Diagnosis not present

## 2021-04-20 DIAGNOSIS — Z135 Encounter for screening for eye and ear disorders: Secondary | ICD-10-CM | POA: Diagnosis not present

## 2021-04-20 DIAGNOSIS — H5203 Hypermetropia, bilateral: Secondary | ICD-10-CM | POA: Diagnosis not present

## 2021-04-20 DIAGNOSIS — H52223 Regular astigmatism, bilateral: Secondary | ICD-10-CM | POA: Diagnosis not present

## 2021-05-26 DIAGNOSIS — Z1231 Encounter for screening mammogram for malignant neoplasm of breast: Secondary | ICD-10-CM | POA: Diagnosis not present

## 2021-05-26 DIAGNOSIS — M85832 Other specified disorders of bone density and structure, left forearm: Secondary | ICD-10-CM | POA: Diagnosis not present

## 2021-05-26 DIAGNOSIS — Z780 Asymptomatic menopausal state: Secondary | ICD-10-CM | POA: Diagnosis not present

## 2021-07-26 DIAGNOSIS — R109 Unspecified abdominal pain: Secondary | ICD-10-CM | POA: Diagnosis not present

## 2021-08-07 DIAGNOSIS — R197 Diarrhea, unspecified: Secondary | ICD-10-CM | POA: Diagnosis not present

## 2021-09-11 ENCOUNTER — Encounter: Payer: Self-pay | Admitting: Gastroenterology

## 2021-09-14 ENCOUNTER — Other Ambulatory Visit: Payer: Self-pay | Admitting: Family Medicine

## 2021-09-14 DIAGNOSIS — R109 Unspecified abdominal pain: Secondary | ICD-10-CM | POA: Diagnosis not present

## 2021-09-14 DIAGNOSIS — R197 Diarrhea, unspecified: Secondary | ICD-10-CM | POA: Diagnosis not present

## 2021-09-14 DIAGNOSIS — R69 Illness, unspecified: Secondary | ICD-10-CM | POA: Diagnosis not present

## 2021-09-14 DIAGNOSIS — R634 Abnormal weight loss: Secondary | ICD-10-CM | POA: Diagnosis not present

## 2021-09-21 DIAGNOSIS — K529 Noninfective gastroenteritis and colitis, unspecified: Secondary | ICD-10-CM | POA: Diagnosis not present

## 2021-09-21 DIAGNOSIS — R630 Anorexia: Secondary | ICD-10-CM | POA: Diagnosis not present

## 2021-09-21 DIAGNOSIS — R109 Unspecified abdominal pain: Secondary | ICD-10-CM | POA: Diagnosis not present

## 2021-09-21 DIAGNOSIS — E871 Hypo-osmolality and hyponatremia: Secondary | ICD-10-CM | POA: Diagnosis not present

## 2021-09-22 DIAGNOSIS — C786 Secondary malignant neoplasm of retroperitoneum and peritoneum: Secondary | ICD-10-CM

## 2021-09-22 HISTORY — DX: Secondary malignant neoplasm of retroperitoneum and peritoneum: C78.6

## 2021-09-24 DIAGNOSIS — K529 Noninfective gastroenteritis and colitis, unspecified: Secondary | ICD-10-CM | POA: Diagnosis not present

## 2021-10-09 ENCOUNTER — Ambulatory Visit
Admission: RE | Admit: 2021-10-09 | Discharge: 2021-10-09 | Disposition: A | Discharge status: Home / Self Care | Payer: Medicare HMO | Source: Ambulatory Visit | Attending: Family Medicine | Admitting: Family Medicine

## 2021-10-09 DIAGNOSIS — K6389 Other specified diseases of intestine: Secondary | ICD-10-CM | POA: Diagnosis not present

## 2021-10-09 DIAGNOSIS — K5730 Diverticulosis of large intestine without perforation or abscess without bleeding: Secondary | ICD-10-CM | POA: Diagnosis not present

## 2021-10-09 DIAGNOSIS — K56609 Unspecified intestinal obstruction, unspecified as to partial versus complete obstruction: Secondary | ICD-10-CM | POA: Diagnosis not present

## 2021-10-09 DIAGNOSIS — N200 Calculus of kidney: Secondary | ICD-10-CM | POA: Diagnosis not present

## 2021-10-09 DIAGNOSIS — R109 Unspecified abdominal pain: Secondary | ICD-10-CM

## 2021-10-09 MED ORDER — IOPAMIDOL (ISOVUE-300) INJECTION 61%
80.0000 mL | Freq: Once | INTRAVENOUS | Status: AC | PRN
Start: 2021-10-09 — End: 2021-10-09
  Administered 2021-10-09: 80 mL via INTRAVENOUS

## 2021-10-12 DIAGNOSIS — K6389 Other specified diseases of intestine: Secondary | ICD-10-CM | POA: Diagnosis not present

## 2021-10-12 DIAGNOSIS — C786 Secondary malignant neoplasm of retroperitoneum and peritoneum: Secondary | ICD-10-CM | POA: Diagnosis not present

## 2021-10-12 DIAGNOSIS — R590 Localized enlarged lymph nodes: Secondary | ICD-10-CM | POA: Diagnosis not present

## 2021-10-12 DIAGNOSIS — R911 Solitary pulmonary nodule: Secondary | ICD-10-CM | POA: Diagnosis not present

## 2021-10-14 ENCOUNTER — Telehealth: Payer: Self-pay | Admitting: *Deleted

## 2021-10-14 ENCOUNTER — Encounter: Payer: Self-pay | Admitting: *Deleted

## 2021-10-14 NOTE — Progress Notes (Signed)
PATIENT NAVIGATOR PROGRESS NOTE  Name: Nancy Harrison Date: 10/14/2021 MRN: [id number]  DOB: [birth year]   Reason for visit:  Introductory phone call  Comments:  Called and spoke with patient, Scheduled to see Dr Benay Spice on 10/15/21 at 1:40 pm. Directions to building and parking reviewed as well as bringing one support person.  Verbalized understanding    Time spent counseling/coordinating care: 15-30 minutes

## 2021-10-14 NOTE — Telephone Encounter (Signed)
Received call from [place] with Dr. 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 They have also made a GS referral. She is unsure if she has had colonoscopy yet. Informed her that MD will review chart and patient will be called.

## 2021-10-15 ENCOUNTER — Encounter (HOSPITAL_COMMUNITY): Payer: Self-pay

## 2021-10-15 ENCOUNTER — Inpatient Hospital Stay: Payer: Medicare HMO | Attending: Oncology | Admitting: Oncology

## 2021-10-15 ENCOUNTER — Inpatient Hospital Stay (HOSPITAL_COMMUNITY)
Admit: 2021-10-15 | Discharge: 2021-10-22 | DRG: 330 | Disposition: A | Discharge status: Home / Self Care | Payer: Medicare HMO | Source: Ambulatory Visit | Attending: Internal Medicine | Admitting: Internal Medicine

## 2021-10-15 VITALS — BP 125/67 | HR 83 | Temp 98.1°F | Resp 20 | Ht 59.0 in | Wt 127.0 lb

## 2021-10-15 DIAGNOSIS — Z801 Family history of malignant neoplasm of trachea, bronchus and lung: Secondary | ICD-10-CM | POA: Diagnosis not present

## 2021-10-15 DIAGNOSIS — R197 Diarrhea, unspecified: Secondary | ICD-10-CM | POA: Diagnosis not present

## 2021-10-15 DIAGNOSIS — K5939 Other megacolon: Secondary | ICD-10-CM | POA: Diagnosis not present

## 2021-10-15 DIAGNOSIS — K2270 Barrett's esophagus without dysplasia: Secondary | ICD-10-CM | POA: Diagnosis present

## 2021-10-15 DIAGNOSIS — Z808 Family history of malignant neoplasm of other organs or systems: Secondary | ICD-10-CM | POA: Diagnosis not present

## 2021-10-15 DIAGNOSIS — K6389 Other specified diseases of intestine: Secondary | ICD-10-CM

## 2021-10-15 DIAGNOSIS — D539 Nutritional anemia, unspecified: Secondary | ICD-10-CM | POA: Diagnosis not present

## 2021-10-15 DIAGNOSIS — R54 Age-related physical debility: Secondary | ICD-10-CM | POA: Diagnosis not present

## 2021-10-15 DIAGNOSIS — Z853 Personal history of malignant neoplasm of breast: Secondary | ICD-10-CM | POA: Diagnosis not present

## 2021-10-15 DIAGNOSIS — R591 Generalized enlarged lymph nodes: Secondary | ICD-10-CM | POA: Diagnosis present

## 2021-10-15 DIAGNOSIS — M1990 Unspecified osteoarthritis, unspecified site: Secondary | ICD-10-CM | POA: Diagnosis not present

## 2021-10-15 DIAGNOSIS — R634 Abnormal weight loss: Secondary | ICD-10-CM | POA: Diagnosis present

## 2021-10-15 DIAGNOSIS — Z800 Family history of malignant neoplasm of digestive organs: Secondary | ICD-10-CM

## 2021-10-15 DIAGNOSIS — Z923 Personal history of irradiation: Secondary | ICD-10-CM

## 2021-10-15 DIAGNOSIS — Z87891 Personal history of nicotine dependence: Secondary | ICD-10-CM | POA: Diagnosis not present

## 2021-10-15 DIAGNOSIS — C800 Disseminated malignant neoplasm, unspecified: Secondary | ICD-10-CM | POA: Diagnosis not present

## 2021-10-15 DIAGNOSIS — Z96653 Presence of artificial knee joint, bilateral: Secondary | ICD-10-CM | POA: Diagnosis present

## 2021-10-15 DIAGNOSIS — Z932 Ileostomy status: Secondary | ICD-10-CM

## 2021-10-15 DIAGNOSIS — K56609 Unspecified intestinal obstruction, unspecified as to partial versus complete obstruction: Secondary | ICD-10-CM

## 2021-10-15 DIAGNOSIS — E785 Hyperlipidemia, unspecified: Secondary | ICD-10-CM | POA: Diagnosis not present

## 2021-10-15 DIAGNOSIS — M899 Disorder of bone, unspecified: Secondary | ICD-10-CM

## 2021-10-15 DIAGNOSIS — R911 Solitary pulmonary nodule: Secondary | ICD-10-CM | POA: Diagnosis not present

## 2021-10-15 DIAGNOSIS — I10 Essential (primary) hypertension: Secondary | ICD-10-CM | POA: Diagnosis not present

## 2021-10-15 DIAGNOSIS — Z171 Estrogen receptor negative status [ER-]: Secondary | ICD-10-CM | POA: Diagnosis not present

## 2021-10-15 DIAGNOSIS — D490 Neoplasm of unspecified behavior of digestive system: Secondary | ICD-10-CM | POA: Diagnosis not present

## 2021-10-15 DIAGNOSIS — G893 Neoplasm related pain (acute) (chronic): Secondary | ICD-10-CM

## 2021-10-15 DIAGNOSIS — K5669 Other partial intestinal obstruction: Secondary | ICD-10-CM | POA: Diagnosis not present

## 2021-10-15 DIAGNOSIS — E876 Hypokalemia: Secondary | ICD-10-CM | POA: Diagnosis not present

## 2021-10-15 DIAGNOSIS — E039 Hypothyroidism, unspecified: Secondary | ICD-10-CM | POA: Diagnosis not present

## 2021-10-15 DIAGNOSIS — K219 Gastro-esophageal reflux disease without esophagitis: Secondary | ICD-10-CM | POA: Diagnosis present

## 2021-10-15 DIAGNOSIS — M6281 Muscle weakness (generalized): Secondary | ICD-10-CM | POA: Diagnosis not present

## 2021-10-15 DIAGNOSIS — Z79899 Other long term (current) drug therapy: Secondary | ICD-10-CM | POA: Diagnosis not present

## 2021-10-15 DIAGNOSIS — C7951 Secondary malignant neoplasm of bone: Secondary | ICD-10-CM | POA: Diagnosis not present

## 2021-10-15 DIAGNOSIS — R451 Restlessness and agitation: Secondary | ICD-10-CM | POA: Diagnosis not present

## 2021-10-15 DIAGNOSIS — Z803 Family history of malignant neoplasm of breast: Secondary | ICD-10-CM

## 2021-10-15 DIAGNOSIS — C786 Secondary malignant neoplasm of retroperitoneum and peritoneum: Principal | ICD-10-CM | POA: Diagnosis present

## 2021-10-15 DIAGNOSIS — Z6827 Body mass index (BMI) 27.0-27.9, adult: Secondary | ICD-10-CM

## 2021-10-15 DIAGNOSIS — C7989 Secondary malignant neoplasm of other specified sites: Secondary | ICD-10-CM | POA: Diagnosis not present

## 2021-10-15 DIAGNOSIS — Z888 Allergy status to other drugs, medicaments and biological substances status: Secondary | ICD-10-CM | POA: Diagnosis not present

## 2021-10-15 DIAGNOSIS — C801 Malignant (primary) neoplasm, unspecified: Secondary | ICD-10-CM | POA: Diagnosis not present

## 2021-10-15 DIAGNOSIS — F419 Anxiety disorder, unspecified: Secondary | ICD-10-CM | POA: Diagnosis not present

## 2021-10-15 DIAGNOSIS — Z7982 Long term (current) use of aspirin: Secondary | ICD-10-CM

## 2021-10-15 DIAGNOSIS — K639 Disease of intestine, unspecified: Secondary | ICD-10-CM

## 2021-10-15 DIAGNOSIS — Z931 Gastrostomy status: Secondary | ICD-10-CM

## 2021-10-15 DIAGNOSIS — Z432 Encounter for attention to ileostomy: Secondary | ICD-10-CM

## 2021-10-15 DIAGNOSIS — Z4682 Encounter for fitting and adjustment of non-vascular catheter: Secondary | ICD-10-CM | POA: Diagnosis not present

## 2021-10-15 DIAGNOSIS — K56691 Other complete intestinal obstruction: Secondary | ICD-10-CM | POA: Diagnosis not present

## 2021-10-15 LAB — COMPREHENSIVE METABOLIC PANEL
ALT: 11 U/L (ref 0–44)
AST: 23 U/L (ref 15–41)
Albumin: 3.5 g/dL (ref 3.5–5.0)
Alkaline Phosphatase: 43 U/L (ref 38–126)
Anion gap: 9 (ref 5–15)
BUN: 14 mg/dL (ref 8–23)
CO2: 25 mmol/L (ref 22–32)
Calcium: 9.4 mg/dL (ref 8.9–10.3)
Chloride: 98 mmol/L (ref 98–111)
Creatinine, Ser: 0.62 mg/dL (ref 0.44–1.00)
GFR, Estimated: >60 mL/min (ref >60)
Glucose, Bld: 125 mg/dL — ABNORMAL HIGH (ref 70–99)
Potassium: 3.8 mmol/L (ref 3.5–5.1)
Sodium: 132 mmol/L — ABNORMAL LOW (ref 135–145)
Total Bilirubin: 0.6 mg/dL (ref 0.3–1.2)
Total Protein: 6.4 g/dL — ABNORMAL LOW (ref 6.5–8.1)

## 2021-10-15 LAB — CBC
HCT: 35.4 % — ABNORMAL LOW (ref 36.0–46.0)
Hemoglobin: 12 g/dL (ref 12.0–15.0)
MCH: 35.2 pg — ABNORMAL HIGH (ref 26.0–34.0)
MCHC: 33.9 g/dL (ref 30.0–36.0)
MCV: 103.8 fL — ABNORMAL HIGH (ref 80.0–100.0)
Platelets: 359 10*3/uL (ref 150–400)
RBC: 3.41 MIL/uL — ABNORMAL LOW (ref 3.87–5.11)
RDW: 13.2 % (ref 11.5–15.5)
WBC: 6.1 10*3/uL (ref 4.0–10.5)
nRBC: 0 % (ref 0.0–0.2)

## 2021-10-15 MED ORDER — SODIUM CHLORIDE 0.9% FLUSH
3.0000 mL | Freq: Two times a day (BID) | INTRAVENOUS | Status: DC
  Administered 2021-10-16 – 2021-10-21 (×8): 3 mL via INTRAVENOUS

## 2021-10-15 MED ORDER — BISOPROLOL FUMARATE 5 MG PO TABS
10.0000 mg | ORAL_TABLET | Freq: Every day | ORAL | Status: DC
Start: 2021-10-16 — End: 2021-10-17

## 2021-10-15 MED ORDER — BISOPROLOL-HYDROCHLOROTHIAZIDE 10-6.25 MG PO TABS: 1.0000 | ORAL_TABLET | Freq: Every day | ORAL | Status: DC

## 2021-10-15 MED ORDER — SIMETHICONE 80 MG PO CHEW
80.0000 mg | CHEWABLE_TABLET | Freq: Four times a day (QID) | ORAL | Status: DC | PRN
  Administered 2021-10-16 (×2): 80 mg via ORAL
  Filled 2021-10-15 (×2): qty 1

## 2021-10-15 MED ORDER — ACETAMINOPHEN 650 MG RE SUPP: 650.0000 mg | Freq: Four times a day (QID) | RECTAL | Status: DC | PRN

## 2021-10-15 MED ORDER — HYDROCHLOROTHIAZIDE 12.5 MG PO TABS: 6.2500 mg | ORAL_TABLET | Freq: Every day | ORAL | Status: DC

## 2021-10-15 MED ORDER — ACETAMINOPHEN 325 MG PO TABS: 650.0000 mg | ORAL_TABLET | Freq: Four times a day (QID) | ORAL | Status: DC | PRN

## 2021-10-15 MED ORDER — POLYETHYLENE GLYCOL 3350 17 G PO PACK: 17.0000 g | PACK | Freq: Every day | ORAL | Status: DC | PRN

## 2021-10-15 MED ORDER — LISINOPRIL 10 MG PO TABS: 10.0000 mg | ORAL_TABLET | Freq: Every day | ORAL | Status: DC

## 2021-10-15 NOTE — H&P (Signed)
History and Physical   Analy Bassford MWU:[id number] DOB: [birth year] DOA: 10/15/2021  PCP: Kristen Loader, FNP   Patient coming from: Cancer center  Chief Complaint: Colonic mass  HPI: Nancy Harrison is a 81 y.o. female with medical history significant of breast cancer, anxiety, hypertension, hyperlipidemia, GERD, goiter presenting with colonic mass and abdominal symptoms.  Patient was seen at cancer center today and due to severity of colonic mass and concern for developing obstruction Case was discussed with surgery who recommended medicine admission and surgery and oncology will consult.  Patient has had several months of abdominal distention and about a month of intermittent diarrhea and abdominal pain.  Her PCP ordered a CT which showed a right lower lobe lung nodule and dilation of the transverse colon with a mass at the distal transverse colon consistent with malignancy.  Also noted with lymphadenopathy and omental caking.  Patient referred to oncology.  Seen by oncology today and concern for this mass and developing/impending colonic obstruction based on recent symptoms.  Imaging and case was reviewed with general surgery by oncologist who recommended admission by medicine service and possible palliative resection this admission.  Does report 10 to 12 pound weight loss in the last couple months.  Patient denies fevers, chills, chest pain, shortness of breath, constipation, nausea, vomiting.  Course: At cancer center vital signs are stable.  No labs obtained today.  No imaging obtained today old imaging as per HPI.  Patient being admitted for surgical evaluation and oncology work-up.  Review of Systems: As per HPI otherwise all other systems reviewed and are negative.  Past Medical History:  Diagnosis Date   Arthritis    Barrett's esophagus    Path 10/2001   Cancer [facility]) 1999   rt breast   Cataract    Hypertension    Thyroid disease     Past Surgical History:   Procedure Laterality Date   BREAST LUMPECTOMY  1999   Chemo and radiation on right breast   [place] 2001    Social History  reports that she quit smoking about 49 years ago. She has never used smokeless tobacco. She reports current alcohol use of about 8.0 standard drinks of alcohol per week. She reports that she does not use drugs.  Allergies  Allergen Reactions   Sporanox [Itraconazole] Rash    Family History  Problem Relation Age of Onset   Cancer Mother        COLON   Colon cancer Mother 108   Cancer Sister        breast   Breast cancer Sister    Cancer Maternal Aunt        breast   Colon cancer Maternal Aunt    Stomach cancer Neg Hx    Rectal cancer Neg Hx    Esophageal cancer Neg Hx   Reviewed on admission  Prior to Admission medications   Medication Sig Start Date End Date Taking? Authorizing Provider  aspirin 81 MG tablet Take 81 mg by mouth daily.      [provider]  bisoprolol-hydrochlorothiazide (ZIAC) 10-6.25 MG per tablet Take 1 tablet by mouth daily.      [provider]  ibuprofen (ADVIL,MOTRIN) 200 MG tablet Take 600 mg by mouth every 6 (six) hours as needed. Aches and pain    [provider]  lisinopril (PRINIVIL,ZESTRIL) 10 MG tablet Take 10 mg by mouth daily.  [provider]  loperamide (IMODIUM) 2 MG capsule Take by mouth as needed for diarrhea or loose stools.    [provider]  loratadine (CLARITIN) 10 MG tablet Take 10 mg by mouth daily.    [provider]  Multiple Vitamin (MULTIVITAMIN PO) Take 1 tablet by mouth daily.     [provider]    Physical Exam: Vitals:   10/15/21 1829  BP: 128/70  Pulse: 83  Resp: 16  Temp: 98.8 F (37.1 C)  TempSrc: Oral  SpO2: 98%    Physical Exam Constitutional:      General: She is not in acute distress.    Appearance: Normal appearance.  HENT:     Head: Normocephalic and atraumatic.      Mouth/Throat:     Mouth: Mucous membranes are moist.     Pharynx: Oropharynx is clear.  Eyes:     Extraocular Movements: Extraocular movements intact.     Pupils: Pupils are equal, round, and reactive to light.  Cardiovascular:     Rate and Rhythm: Normal rate and regular rhythm.     Pulses: Normal pulses.     Heart sounds: Normal heart sounds.  Pulmonary:     Effort: Pulmonary effort is normal. No respiratory distress.     Breath sounds: Normal breath sounds.  Abdominal:     General: Bowel sounds are normal. There is distension.     Palpations: Abdomen is soft.     Tenderness: There is no abdominal tenderness.  Musculoskeletal:        General: No swelling or deformity.  Skin:    General: Skin is warm and dry.  Neurological:     General: No focal deficit present.     Mental Status: Mental status is at baseline.    Labs on Admission: I have personally reviewed following labs and imaging studies  CBC: No results for input(s): "WBC", "NEUTROABS", "HGB", "HCT", "MCV", "PLT" in the last 168 hours.  Basic Metabolic Panel: No results for input(s): "NA", "K", "CL", "CO2", "GLUCOSE", "BUN", "CREATININE", "CALCIUM", "MG", "PHOS" in the last 168 hours.  GFR: CrCl cannot be calculated (Patient's most recent lab result is older than the maximum 21 days allowed.).  Liver Function Tests: No results for input(s): "AST", "ALT", "ALKPHOS", "BILITOT", "PROT", "ALBUMIN" in the last 168 hours.  Urine analysis: No results found for: "COLORURINE", "APPEARANCEUR", "LABSPEC", "PHURINE", "GLUCOSEU", "HGBUR", "BILIRUBINUR", "KETONESUR", "PROTEINUR", "UROBILINOGEN", "NITRITE", "LEUKOCYTESUR"  Radiological Exams on Admission: No results found.  EKG: No recent EKG performed.  Assessment/Plan Principal Problem:   Colonic mass Active Problems:   Essential hypertension  Colonic mass > As per HPI patient presenting after evaluation by oncology to whom she was referred for colonic mass. >  Recent CT imaging shows dilated transverse colon with a mass of the distal transverse colon consistent with malignancy and associated lymphadenopathy and omental caking. > After oncology visit today Case was discussed with general surgery by oncologist and there is concern for impending/developing obstruction.  General surgery recommended medical admission and they and oncology will consult with plans for possible palliative resection this admission. - Monitor on MedSurg for now - Appreciate general surgery and oncology recommendations - Full liquid diet - I's and O's, daily weights  Hypertension - Continue home bisoprolol/high chlorothiazide - Continue home lisinopril  History of breast cancer, hyperlipidemia, GERD, anxiety - Not currently on any medications for these  DVT prophylaxis: SCDs Code Status:   Full Family Communication:  Updated at bedside Disposition Plan:  Patient is from:  Home  Anticipated DC to:  Home  Anticipated DC date:  2 to 5 days  Anticipated DC barriers: None  Consults called:  Oncology directly admitted patient and will be consulting.  General surgery consulted by oncology prior to admission and advised mission they will be consulting as well. Admission status:  Inpatient, MedSurg  Severity of Illness: The appropriate patient status for this patient is INPATIENT. Inpatient status is judged to be reasonable and necessary in order to provide the required intensity of service to ensure the patient's safety. The patient's presenting symptoms, physical exam findings, and initial radiographic and laboratory data in the context of their chronic comorbidities is felt to place them at high risk for further clinical deterioration. Furthermore, it is not anticipated that the patient will be medically stable for discharge from the hospital within 2 midnights of admission.   * I certify that at the point of admission it is my clinical judgment that the patient will require  inpatient hospital care spanning beyond 2 midnights from the point of admission due to high intensity of service, high risk for further deterioration and high frequency of surveillance required.Marcelyn Bruins MD [facility]  How to contact the [facility] Attending or Consulting provider [place] or covering provider during after hours [place], for this patient?   Check the care team in [facility] and look for a) attending/consulting [facility] provider listed and b) the [facility] team listed Log into [email or web address] and use [place]'s universal password to access. If you do not have the password, please contact the hospital operator. Locate the [facility] provider you are looking for under [facility] and page to a number that you can be directly reached. If you still have difficulty reaching the provider, please page the [facility] (Director on Call) for the Hospitalists listed on amion for assistance.  10/15/2021, 7:53 PM

## 2021-10-15 NOTE — Progress Notes (Signed)
[facility] New Patient Consult   Requesting MD: Kristen Loader, [place],  [place] [address]   Nancy Harrison 81 y.o.  1940-06-21    Reason for Consult: Colon mass   HPI: Nancy Harrison reports a month history of intermittent diarrhea and abdominal "gas "pain.  She reports having 8 loose bowel movement every 3 to 4 days. She presented to her primary provider and was referred for a CT abdomen/pelvis on 10/09/2021.  A nodule was noted in the right lower lobe.  No focal liver abnormality.  There is severe dilation of the right and proximal transverse colon with an regular mass in the distal transverse colon consistent with a colonic malignancy.  Mildly enlarged periaortic nodes.  Total peritoneal implants, chiefly in the left abdomen with omental caking.  Multiple round lucencies in the proximal femurs concerning for metastatic disease.  She had a colonoscopy on 10/06/2016.  A polyp was removed from the sigmoid colon.  The pathology revealed a hyperplastic polyp.    Past Medical History:  Diagnosis Date   Arthritis    Barrett's esophagus    Path 10/2001   Cancer [facility]) 1999   rt breast   Cataract    Hypertension    Thyroid disease     .  G3, P3  Past Surgical History:  Procedure Laterality Date   BREAST LUMPECTOMY  1999   Chemo and radiation on right breast   [place] & 2001    Medications: Reviewed  Allergies:  Allergies  Allergen Reactions   Sporanox [Itraconazole] Rash    Family history: Her sister had metastatic lung cancer.  Her mother died of colon cancer.  Her daughter had appendix carcinoma.  Social History:   She lives with her husband [place].  She is retired from [employer].  She quit smoking cigarettes at age 76 or 12.  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  No transfusion history.  No risk factor for HIV or hepatitis.   She has received COVID-19 vaccines.  ROS:   Positives include: Anorexia, 10 pound weight loss over the past few months, diarrhea every 3 to 4 days, abdominal "gas "pain, fullness at the umbilicus  A complete ROS was otherwise negative.  Physical Exam:  Blood pressure 125/67, pulse 83, temperature 98.1 F (36.7 C), temperature source Oral, resp. rate 20, height '4\' 11"'  (1.499 m), weight 127 lb (57.6 kg), SpO2 98 %.  HEENT: Oropharynx without visible mass, neck without mass Lungs: Clear bilaterally Cardiac: Regular rate and rhythm Abdomen: Distended, hyperactive bowel sounds, no discrete mass, no hepatosplenomegaly  Vascular: No leg edema Lymph nodes: No cervical, supraclavicular, axillary, or inguinal nodes Neurologic: Alert and oriented, the motor exam appears intact in the upper and lower extremities bilaterally Skin: No rash Musculoskeletal: No spine tenderness Breast: Left breast without mass, status post right lumpectomy.  No evidence of local tumor recurrence.   LAB: 09/15/2021-hemoglobin 13.2, MCV 99, platelets 388,000, white count 6.4, ANC 4.1.  Creatinine 0.64, sodium 132, potassium 4.2, calcium 10.2, albumin 4.1, bilirubin 0.4, alkaline phosphatase 54, AST 26, ALT 10  Imaging: CT abdomen/pelvis 10/09/2021-images reviewed with Ms. Urda and her husband    Assessment/Plan:   Colon obstruction CT abdomen/pelvis 10/09/2021-irregular mass in the distal transverse colon with dilation of the transverse and right colon, and distal small bowel.  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  Bone lesions concerning for metastases, right lung nodule .  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  Lumpectomy and adjuvant CMF x8 followed by radiation Hypertension Hypothyroidism Barrett's esophagus Family history of multiple cancers including appendix,  colon, and lung cancer    Mr. Shreffler presents with clinical and radiologic evidence of a colonic obstruction.  The imaging findings are concerning for a primary colon tumor.  However she had a colonoscopy with removal of a hyperplastic polyp in 2018.  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  She may have carcinomatosis from another primary tumor site.  It is possible she has a late recurrence of breast cancer.  I reviewed the case with the surgical service.  They recommend hospital admission for management of the impending bowel obstruction.  She may be a candidate for a palliative bowel resection.  I contacted the hospitalist service to arrange for hospital admission.  I will check on her in the hospital.  I recommended she follow a liquid diet for now.  Outpatient follow-up will be scheduled at the Cancer center.   [name], MD  10/15/2021, 5:51 PM

## 2021-10-16 ENCOUNTER — Telehealth: Payer: Self-pay

## 2021-10-16 ENCOUNTER — Inpatient Hospital Stay (HOSPITAL_COMMUNITY): Payer: Medicare HMO

## 2021-10-16 ENCOUNTER — Other Ambulatory Visit: Payer: Self-pay

## 2021-10-16 ENCOUNTER — Encounter (HOSPITAL_COMMUNITY)
Disposition: A | Discharge status: Home / Self Care | Payer: Self-pay | Source: Ambulatory Visit | Attending: Internal Medicine

## 2021-10-16 ENCOUNTER — Encounter (HOSPITAL_COMMUNITY): Payer: Self-pay | Admitting: Internal Medicine

## 2021-10-16 ENCOUNTER — Inpatient Hospital Stay (HOSPITAL_COMMUNITY): Payer: Medicare HMO | Admitting: Certified Registered Nurse Anesthetist

## 2021-10-16 DIAGNOSIS — C800 Disseminated malignant neoplasm, unspecified: Secondary | ICD-10-CM | POA: Diagnosis not present

## 2021-10-16 DIAGNOSIS — Z87891 Personal history of nicotine dependence: Secondary | ICD-10-CM

## 2021-10-16 DIAGNOSIS — K56609 Unspecified intestinal obstruction, unspecified as to partial versus complete obstruction: Secondary | ICD-10-CM

## 2021-10-16 DIAGNOSIS — K6389 Other specified diseases of intestine: Secondary | ICD-10-CM | POA: Diagnosis not present

## 2021-10-16 DIAGNOSIS — M1990 Unspecified osteoarthritis, unspecified site: Secondary | ICD-10-CM

## 2021-10-16 DIAGNOSIS — I10 Essential (primary) hypertension: Secondary | ICD-10-CM

## 2021-10-16 HISTORY — PX: LAPAROSCOPY: SHX197

## 2021-10-16 LAB — COMPREHENSIVE METABOLIC PANEL
ALT: 11 U/L (ref 0–44)
AST: 23 U/L (ref 15–41)
Albumin: 3.5 g/dL (ref 3.5–5.0)
Alkaline Phosphatase: 38 U/L (ref 38–126)
Anion gap: 8 (ref 5–15)
BUN: 13 mg/dL (ref 8–23)
CO2: 25 mmol/L (ref 22–32)
Calcium: 9.6 mg/dL (ref 8.9–10.3)
Chloride: 102 mmol/L (ref 98–111)
Creatinine, Ser: 0.5 mg/dL (ref 0.44–1.00)
GFR, Estimated: >60 mL/min (ref >60)
Glucose, Bld: 90 mg/dL (ref 70–99)
Potassium: 3.8 mmol/L (ref 3.5–5.1)
Sodium: 135 mmol/L (ref 135–145)
Total Bilirubin: 0.8 mg/dL (ref 0.3–1.2)
Total Protein: 6.1 g/dL — ABNORMAL LOW (ref 6.5–8.1)

## 2021-10-16 LAB — CBC
HCT: 36.1 % (ref 36.0–46.0)
Hemoglobin: 12.2 g/dL (ref 12.0–15.0)
MCH: 34.8 pg — ABNORMAL HIGH (ref 26.0–34.0)
MCHC: 33.8 g/dL (ref 30.0–36.0)
MCV: 102.8 fL — ABNORMAL HIGH (ref 80.0–100.0)
Platelets: 303 10*3/uL (ref 150–400)
RBC: 3.51 MIL/uL — ABNORMAL LOW (ref 3.87–5.11)
RDW: 13.2 % (ref 11.5–15.5)
WBC: 5.9 10*3/uL (ref 4.0–10.5)
nRBC: 0 % (ref 0.0–0.2)

## 2021-10-16 LAB — ABO/RH: ABO/RH(D): A POS

## 2021-10-16 LAB — TYPE AND SCREEN
ABO/RH(D): A POS
Antibody Screen: NEGATIVE

## 2021-10-16 SURGERY — LAPAROSCOPY, DIAGNOSTIC
Anesthesia: General | Site: Abdomen

## 2021-10-16 MED ORDER — LIDOCAINE 2% (20 MG/ML) 5 ML SYRINGE
INTRAMUSCULAR | Status: DC | PRN
  Administered 2021-10-16: 40 mg via INTRAVENOUS

## 2021-10-16 MED ORDER — ALBUMIN HUMAN 5 % IV SOLN: 12.5000 g | Freq: Four times a day (QID) | INTRAVENOUS | Status: DC | PRN

## 2021-10-16 MED ORDER — METHOCARBAMOL 1000 MG/10ML IJ SOLN
500.0000 mg | Freq: Three times a day (TID) | INTRAVENOUS | Status: DC
  Administered 2021-10-16 – 2021-10-19 (×8): 500 mg via INTRAVENOUS
  Filled 2021-10-16 (×2): qty 500
  Filled 2021-10-16: qty 5
  Filled 2021-10-16 (×4): qty 500
  Filled 2021-10-16: qty 5
  Filled 2021-10-16: qty 500

## 2021-10-16 MED ORDER — ONDANSETRON HCL 4 MG/2ML IJ SOLN
INTRAMUSCULAR | Status: DC | PRN
  Administered 2021-10-16: 4 mg via INTRAVENOUS

## 2021-10-16 MED ORDER — LIDOCAINE HCL (PF) 2 % IJ SOLN
INTRAMUSCULAR | Status: AC
  Filled 2021-10-16: qty 5

## 2021-10-16 MED ORDER — AMISULPRIDE (ANTIEMETIC) 5 MG/2ML IV SOLN: 10.0000 mg | Freq: Once | INTRAVENOUS | Status: DC | PRN

## 2021-10-16 MED ORDER — PHENYLEPHRINE 80 MCG/ML (10ML) SYRINGE FOR IV PUSH (FOR BLOOD PRESSURE SUPPORT)
PREFILLED_SYRINGE | INTRAVENOUS | Status: DC | PRN
  Administered 2021-10-16: 160 ug via INTRAVENOUS

## 2021-10-16 MED ORDER — KETOROLAC TROMETHAMINE 15 MG/ML IJ SOLN
INTRAMUSCULAR | Status: DC | PRN
  Administered 2021-10-16: 15 mg via INTRAVENOUS

## 2021-10-16 MED ORDER — SUGAMMADEX SODIUM 200 MG/2ML IV SOLN
INTRAVENOUS | Status: DC | PRN
  Administered 2021-10-16: 200 mg via INTRAVENOUS

## 2021-10-16 MED ORDER — ROCURONIUM BROMIDE 10 MG/ML (PF) SYRINGE
PREFILLED_SYRINGE | INTRAVENOUS | Status: AC
  Filled 2021-10-16: qty 10

## 2021-10-16 MED ORDER — SODIUM CHLORIDE 0.9 % IV SOLN: INTRAVENOUS | Status: DC

## 2021-10-16 MED ORDER — PHENYLEPHRINE HCL-NACL 20-0.9 MG/250ML-% IV SOLN
INTRAVENOUS | Status: DC | PRN
  Administered 2021-10-16: 20 ug/min via INTRAVENOUS

## 2021-10-16 MED ORDER — ACETAMINOPHEN 10 MG/ML IV SOLN
INTRAVENOUS | Status: DC | PRN
  Administered 2021-10-16: 1000 mg via INTRAVENOUS

## 2021-10-16 MED ORDER — FENTANYL CITRATE (PF) 100 MCG/2ML IJ SOLN
INTRAMUSCULAR | Status: DC | PRN
  Administered 2021-10-16: 25 ug via INTRAVENOUS
  Administered 2021-10-16 (×3): 50 ug via INTRAVENOUS
  Administered 2021-10-16: 25 ug via INTRAVENOUS

## 2021-10-16 MED ORDER — MENTHOL 3 MG MT LOZG
1.0000 | LOZENGE | OROMUCOSAL | Status: DC | PRN
  Filled 2021-10-16: qty 9

## 2021-10-16 MED ORDER — BUPIVACAINE LIPOSOME 1.3 % IJ SUSP
INTRAMUSCULAR | Status: AC
  Filled 2021-10-16: qty 20

## 2021-10-16 MED ORDER — SODIUM CHLORIDE 0.9 % IV SOLN
2.0000 g | Freq: Two times a day (BID) | INTRAVENOUS | Status: AC
  Administered 2021-10-17: 2 g via INTRAVENOUS
  Filled 2021-10-16 (×2): qty 2

## 2021-10-16 MED ORDER — ACETAMINOPHEN 10 MG/ML IV SOLN
INTRAVENOUS | Status: AC
  Filled 2021-10-16: qty 100

## 2021-10-16 MED ORDER — SODIUM CHLORIDE 0.9 % IV SOLN: 8.0000 mg | Freq: Four times a day (QID) | INTRAVENOUS | Status: DC | PRN

## 2021-10-16 MED ORDER — 0.9 % SODIUM CHLORIDE (POUR BTL) OPTIME
TOPICAL | Status: DC | PRN
  Administered 2021-10-16: 1000 mL

## 2021-10-16 MED ORDER — SUCCINYLCHOLINE CHLORIDE 200 MG/10ML IV SOSY
PREFILLED_SYRINGE | INTRAVENOUS | Status: AC
  Filled 2021-10-16: qty 10

## 2021-10-16 MED ORDER — ROCURONIUM BROMIDE 10 MG/ML (PF) SYRINGE
PREFILLED_SYRINGE | INTRAVENOUS | Status: DC | PRN
  Administered 2021-10-16: 10 mg via INTRAVENOUS
  Administered 2021-10-16: 50 mg via INTRAVENOUS

## 2021-10-16 MED ORDER — DEXAMETHASONE SODIUM PHOSPHATE 10 MG/ML IJ SOLN
INTRAMUSCULAR | Status: DC | PRN
  Administered 2021-10-16: 8 mg via INTRAVENOUS

## 2021-10-16 MED ORDER — PROPOFOL 10 MG/ML IV BOLUS
INTRAVENOUS | Status: DC | PRN
  Administered 2021-10-16: 120 mg via INTRAVENOUS

## 2021-10-16 MED ORDER — ALUM & MAG HYDROXIDE-SIMETH 200-200-20 MG/5ML PO SUSP: 30.0000 mL | Freq: Four times a day (QID) | ORAL | Status: DC | PRN

## 2021-10-16 MED ORDER — MAGIC MOUTHWASH: 15.0000 mL | Freq: Four times a day (QID) | ORAL | Status: DC | PRN

## 2021-10-16 MED ORDER — BUPIVACAINE-EPINEPHRINE 0.5% -1:200000 IJ SOLN
INTRAMUSCULAR | Status: AC
  Filled 2021-10-16: qty 1

## 2021-10-16 MED ORDER — HYDROMORPHONE HCL 1 MG/ML IJ SOLN: 0.5000 mg | INTRAMUSCULAR | Status: DC | PRN

## 2021-10-16 MED ORDER — FENTANYL CITRATE PF 50 MCG/ML IJ SOSY
PREFILLED_SYRINGE | INTRAMUSCULAR | Status: AC
  Filled 2021-10-16: qty 2

## 2021-10-16 MED ORDER — ORAL CARE MOUTH RINSE: 15.0000 mL | Freq: Once | OROMUCOSAL | Status: AC

## 2021-10-16 MED ORDER — LACTATED RINGERS IV SOLN: INTRAVENOUS | Status: DC

## 2021-10-16 MED ORDER — FENTANYL CITRATE PF 50 MCG/ML IJ SOSY
25.0000 ug | PREFILLED_SYRINGE | INTRAMUSCULAR | Status: DC | PRN
  Administered 2021-10-16 (×2): 50 ug via INTRAVENOUS

## 2021-10-16 MED ORDER — LACTATED RINGERS IV SOLN: INTRAVENOUS | Status: DC | PRN

## 2021-10-16 MED ORDER — BUPIVACAINE LIPOSOME 1.3 % IJ SUSP: 20.0000 mL | INTRAMUSCULAR | Status: AC

## 2021-10-16 MED ORDER — ONDANSETRON HCL 4 MG/2ML IJ SOLN: 4.0000 mg | Freq: Four times a day (QID) | INTRAMUSCULAR | Status: DC | PRN

## 2021-10-16 MED ORDER — LIP MEDEX EX OINT
TOPICAL_OINTMENT | Freq: Two times a day (BID) | CUTANEOUS | Status: DC
  Administered 2021-10-19: 75 via TOPICAL
  Administered 2021-10-20: 150 via TOPICAL
  Administered 2021-10-21: 75 via TOPICAL
  Filled 2021-10-16 (×2): qty 7

## 2021-10-16 MED ORDER — SUCCINYLCHOLINE CHLORIDE 200 MG/10ML IV SOSY
PREFILLED_SYRINGE | INTRAVENOUS | Status: DC | PRN
  Administered 2021-10-16: 100 mg via INTRAVENOUS

## 2021-10-16 MED ORDER — ONDANSETRON HCL 4 MG/2ML IJ SOLN
INTRAMUSCULAR | Status: AC
  Filled 2021-10-16: qty 2

## 2021-10-16 MED ORDER — LACTATED RINGERS IR SOLN
Status: DC | PRN
  Administered 2021-10-16: 1000 mL

## 2021-10-16 MED ORDER — FENTANYL CITRATE (PF) 100 MCG/2ML IJ SOLN
INTRAMUSCULAR | Status: AC
  Filled 2021-10-16: qty 2

## 2021-10-16 MED ORDER — PHENOL 1.4 % MT LIQD
2.0000 | OROMUCOSAL | Status: DC | PRN
  Filled 2021-10-16: qty 177

## 2021-10-16 MED ORDER — DEXAMETHASONE SODIUM PHOSPHATE 10 MG/ML IJ SOLN
INTRAMUSCULAR | Status: AC
  Filled 2021-10-16: qty 1

## 2021-10-16 MED ORDER — CHLORHEXIDINE GLUCONATE CLOTH 2 % EX PADS
6.0000 | MEDICATED_PAD | Freq: Once | CUTANEOUS | Status: AC
  Administered 2021-10-16: 6 via TOPICAL

## 2021-10-16 MED ORDER — PROPOFOL 10 MG/ML IV BOLUS
INTRAVENOUS | Status: AC
  Filled 2021-10-16: qty 20

## 2021-10-16 MED ORDER — SODIUM CHLORIDE 0.9 % IV SOLN
2.0000 g | INTRAVENOUS | Status: AC
  Administered 2021-10-16: 2 g via INTRAVENOUS
  Filled 2021-10-16: qty 2

## 2021-10-16 MED ORDER — PROCHLORPERAZINE EDISYLATE 10 MG/2ML IJ SOLN: 5.0000 mg | INTRAMUSCULAR | Status: DC | PRN

## 2021-10-16 MED ORDER — CHLORHEXIDINE GLUCONATE 0.12 % MT SOLN
15.0000 mL | Freq: Once | OROMUCOSAL | Status: AC
  Administered 2021-10-16: 15 mL via OROMUCOSAL

## 2021-10-16 SURGICAL SUPPLY — 57 items
BAG COUNTER SPONGE SURGICOUNT (BAG) IMPLANT
BAG DRN RND TRDRP ANRFLXCHMBR (UROLOGICAL SUPPLIES) ×1
BAG SPEC RTRVL 10 TROC 200 (ENDOMECHANICALS) ×1
BAG SPNG CNTER NS LX DISP (BAG) ×1
BAG URINE DRAIN 2000ML AR STRL (UROLOGICAL SUPPLIES) IMPLANT
CABLE HIGH FREQUENCY MONO STRZ (ELECTRODE) ×1 IMPLANT
CATH GASTROSTOMY 24FR (CATHETERS) ×1 IMPLANT
CATH URET ROBINSON 24FR STRL (CATHETERS) IMPLANT
COVER SURGICAL LIGHT HANDLE (MISCELLANEOUS) ×1 IMPLANT
DEVICE TROCAR PUNCTURE CLOSURE (ENDOMECHANICALS) IMPLANT
DRAPE UTILITY XL STRL (DRAPES) ×1 IMPLANT
DRAPE WARM FLUID 44X44 (DRAPES) ×1 IMPLANT
DRSG TEGADERM 2-3/8X2-3/4 SM (GAUZE/BANDAGES/DRESSINGS) IMPLANT
DRSG TEGADERM 4X4.75 (GAUZE/BANDAGES/DRESSINGS) IMPLANT
ELECT REM PT RETURN 15FT ADLT (MISCELLANEOUS) ×1 IMPLANT
G-TUBE MIC 24F 7-10 BALLOON (CATHETERS) IMPLANT
GAUZE SPONGE 2X2 8PLY STRL LF (GAUZE/BANDAGES/DRESSINGS) ×1 IMPLANT
GLOVE ECLIPSE 8.0 STRL XLNG CF (GLOVE) ×1 IMPLANT
GLOVE INDICATOR 8.0 STRL GRN (GLOVE) ×1 IMPLANT
GOWN STRL REUS W/ TWL XL LVL3 (GOWN DISPOSABLE) ×4 IMPLANT
GOWN STRL REUS W/TWL XL LVL3 (GOWN DISPOSABLE) ×4
IRRIG SUCT STRYKERFLOW 2 WTIP (MISCELLANEOUS) ×1
IRRIGATION SUCT STRKRFLW 2 WTP (MISCELLANEOUS) ×1 IMPLANT
KIT BASIN OR (CUSTOM PROCEDURE TRAY) ×1 IMPLANT
KIT TURNOVER KIT A (KITS) IMPLANT
PAD POSITIONING PINK XL (MISCELLANEOUS) ×1 IMPLANT
POUCH OSTOMY FLEX CONVEX 1-1/2 (OSTOMY) IMPLANT
POUCH RETRIEVAL ECOSAC 10 (ENDOMECHANICALS) IMPLANT
POUCH RETRIEVAL ECOSAC 10MM (ENDOMECHANICALS) ×1
PROTECTOR NERVE ULNAR (MISCELLANEOUS) IMPLANT
RTRCTR WOUND ALEXIS 18CM SML (INSTRUMENTS) ×1
SAVER CELL AAL HAEMONETICS (INSTRUMENTS) IMPLANT
SCISSORS LAP 5X35 DISP (ENDOMECHANICALS) ×1 IMPLANT
SEALER TISSUE G2 STRG ARTC 35C (ENDOMECHANICALS) IMPLANT
SET TUBE SMOKE EVAC HIGH FLOW (TUBING) ×1 IMPLANT
SLEEVE Z-THREAD 5X100MM (TROCAR) ×2 IMPLANT
SPIKE FLUID TRANSFER (MISCELLANEOUS) ×1 IMPLANT
SPONGE DRAIN TRACH 4X4 STRL 2S (GAUZE/BANDAGES/DRESSINGS) IMPLANT
SPONGE GAUZE 2X2 8PLY STRL LF (GAUZE/BANDAGES/DRESSINGS) IMPLANT
SUT MNCRL AB 4-0 PS2 18 (SUTURE) ×1 IMPLANT
SUT PDS AB 2-0 CT1 27 (SUTURE) IMPLANT
SUT PDS AB 2-0 CT2 27 (SUTURE) IMPLANT
SUT PROLENE 0 CT 1 30 (SUTURE) IMPLANT
SUT SILK 2 0 (SUTURE) ×1
SUT SILK 2 0 SH CR/8 (SUTURE) ×1 IMPLANT
SUT SILK 2-0 18XBRD TIE 12 (SUTURE) ×1 IMPLANT
SUT SILK 3 0 (SUTURE) ×1
SUT SILK 3 0 SH CR/8 (SUTURE) ×1 IMPLANT
SUT SILK 3-0 18XBRD TIE 12 (SUTURE) ×1 IMPLANT
SUT VIC AB 2-0 SH 18 (SUTURE) IMPLANT
TOWEL OR 17X26 10 PK STRL BLUE (TOWEL DISPOSABLE) ×1 IMPLANT
TOWEL OR NON WOVEN STRL DISP B (DISPOSABLE) ×1 IMPLANT
TRAY FOLEY MTR SLVR 16FR STAT (SET/KITS/TRAYS/PACK) IMPLANT
TRAY LAPAROSCOPIC (CUSTOM PROCEDURE TRAY) ×1 IMPLANT
TROCAR 11X100 Z THREAD (TROCAR) IMPLANT
TROCAR ADV FIXATION 12X100MM (TROCAR) IMPLANT
TROCAR Z-THREAD OPTICAL 5X100M (TROCAR) ×1 IMPLANT

## 2021-10-16 NOTE — Consult Note (Addendum)
Nancy Harrison 1940/07/02  [id number].    Requesting MD: Dr. Julieanne Manson Chief Complaint/Reason for Consult: colonic obstruction secondary to mass  HPI:  This is a very pleasant 81 yo white female with a history of HTN and thyroid disease who has been in her normal state of health until about 2-3 months ago when she started to develop abdominal distention with a change in her bowel habits.  Her stools became diarrhea about every 3-4 days.  She would have about 4 episodes on these days and nothing in between.  She denies any N/V.  She denies any blood in her stool.  She had a colonoscopy in 2018 with a hyperplastic polyp but no other findings.  She does have a remote history of breast cancer in 1999 that she underwent a lumpectomy for and then chemo/radiation.  Of note, her daughter has goblet cell appendiceal cancer, mom diet of colon cancer, and sister has stage IV breast cancer.  The patient has had a poor appetite and decrease oral intake during this time period.  She has lost about 10 lbs.    She went to see her PCP last week for the above symptoms and underwent a CT scan.  This scan revealed a large irregular mass in the distal transverse colon most c/w colonic malignancy causing obstruction of the rest of the colon.  Mild periaortic adenopathy noted c/w metastatic disease.  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  There are also multiple lucencies in her femurs concerning for bony mets.  A RLL nodule is noted as well concerning for metastatic disease as well.  Given her obstructive symptoms, we have been asked to see her for further evaluation and recommendations.  ROS: ROS: see HPI  Family History  Problem Relation Age of Onset   Cancer Mother        COLON   Colon cancer Mother 66   Cancer Sister        breast   Breast cancer Sister    Cancer Maternal Aunt        breast   Colon cancer Maternal Aunt    Stomach cancer Neg  Hx    Rectal cancer Neg Hx    Esophageal cancer Neg Hx     Past Medical History:  Diagnosis Date   Arthritis    Barrett's esophagus    Path 10/2001   Cancer ([facility]) 1999   rt breast   Cataract    Hypertension    Thyroid disease     Past Surgical History:  Procedure Laterality Date   BREAST LUMPECTOMY  1999   Chemo and radiation on right breast   [place] & 2001    Social History:  reports that she quit smoking about 49 years ago. She has never used smokeless tobacco. She reports current alcohol use of about 8.0 standard drinks of alcohol per week. She reports that she does not use drugs.  Allergies:  Allergies  Allergen Reactions   Sporanox [Itraconazole] Rash    Facility-Administered Medications Prior to Admission  Medication Dose Route Frequency Provider Last Rate Last Admin   0.9 %  sodium chloride infusion  500 mL Intravenous Continuous Nandigam, Kavitha V, MD       Medications Prior to Admission  Medication Sig Dispense Refill   bisoprolol-hydrochlorothiazide (ZIAC) 10-6.25 MG per tablet Take 1 tablet by mouth daily.  ibuprofen (ADVIL,MOTRIN) 200 MG tablet Take 400-600 mg by mouth daily as needed for mild pain. Aches and pain     lisinopril (PRINIVIL,ZESTRIL) 10 MG tablet Take 10 mg by mouth daily.       loperamide (IMODIUM A-D) 2 MG tablet Take 2-4 mg by mouth 4 (four) times daily as needed for diarrhea or loose stools.     loratadine (CLARITIN) 10 MG tablet Take 10 mg by mouth See admin instructions. Take 10 mg by mouth one to two times a day as needed for allergies     Multiple Vitamin (MULTIVITAMIN PO) Take 1 tablet by mouth daily with breakfast.     simethicone (MYLICON) 782 MG chewable tablet Chew 125 mg by mouth every 6 (six) hours as needed for flatulence.       Physical Exam: Blood pressure (!) 149/85, pulse 81, temperature 98.5 F (36.9 C), temperature source Oral, resp. rate 16, SpO2 100 %. General:  pleasant, WD, WN white female who is laying in bed in NAD HEENT: head is normocephalic, atraumatic.  Sclera are noninjected.  PERRL.  Ears and nose without any masses or lesions.  Mouth is pink and moist Heart: regular, rate, and rhythm, but with some ectopic beats.  Normal s1,s2. No obvious murmurs, gallops, or rubs noted.  Palpable radial and pedal pulses bilaterally Lungs: CTAB, no wheezes, rhonchi, or rales noted.  Respiratory effort nonlabored Abd: soft, minimal tenderness in upper abdomen, distended in upper abdomen with some tympany, +BS, no masses, hernias, or organomegaly, but some fullness noted on left side of her abdomen, but no definitive mass noted. MS: all 4 extremities are symmetrical with no cyanosis, clubbing, or edema. Skin: warm and dry with no masses, lesions, or rashes Neuro: Cranial nerves 2-12 grossly intact, sensation is normal throughout Psych: A&Ox3 with an appropriate affect.   Results for orders placed or performed during the hospital encounter of 10/15/21 (from the past 48 hour(s))  CBC     Status: Abnormal   Collection Time: 10/15/21  7:24 PM  Result Value Ref Range   WBC 6.1 4.0 - 10.5 K/uL   RBC 3.41 (L) 3.87 - 5.11 MIL/uL   Hemoglobin 12.0 12.0 - 15.0 g/dL   HCT 35.4 (L) 36.0 - 46.0 %   MCV 103.8 (H) 80.0 - 100.0 fL   MCH 35.2 (H) 26.0 - 34.0 pg   MCHC 33.9 30.0 - 36.0 g/dL   RDW 13.2 11.5 - 15.5 %   Platelets 359 150 - 400 K/uL   nRBC 0.0 0.0 - 0.2 %    Comment: Performed at [facility], [place] [address], [place], [place] [address]  Comprehensive metabolic panel     Status: Abnormal   Collection Time: 10/15/21  7:24 PM  Result Value Ref Range   Sodium 132 (L) 135 - 145 mmol/L   Potassium 3.8 3.5 - 5.1 mmol/L   Chloride 98 98 - 111 mmol/L   CO2 25 22 - 32 mmol/L   Glucose, Bld 125 (H) 70 - 99 mg/dL    Comment: Glucose reference range applies only to samples taken after fasting for at least 8 hours.   BUN 14 8 - 23 mg/dL    Creatinine, Ser 0.62 0.44 - 1.00 mg/dL   Calcium 9.4 8.9 - 10.3 mg/dL   Total Protein 6.4 (L) 6.5 - 8.1 g/dL   Albumin 3.5 3.5 - 5.0 g/dL   AST 23 15 - 41 U/L   ALT 11 0 - 44 U/L  Alkaline Phosphatase 43 38 - 126 U/L   Total Bilirubin 0.6 0.3 - 1.2 mg/dL   GFR, Estimated >60 >60 mL/min    Comment: (NOTE) Calculated using the CKD-EPI Creatinine Equation (2021)    Anion gap 9 5 - 15    Comment: Performed at [facility], [place] [address] [place], [place] [address]  CBC     Status: Abnormal   Collection Time: 10/16/21  5:44 AM  Result Value Ref Range   WBC 5.9 4.0 - 10.5 K/uL   RBC 3.51 (L) 3.87 - 5.11 MIL/uL   Hemoglobin 12.2 12.0 - 15.0 g/dL   HCT 36.1 36.0 - 46.0 %   MCV 102.8 (H) 80.0 - 100.0 fL   MCH 34.8 (H) 26.0 - 34.0 pg   MCHC 33.8 30.0 - 36.0 g/dL   RDW 13.2 11.5 - 15.5 %   Platelets 303 150 - 400 K/uL   nRBC 0.0 0.0 - 0.2 %    Comment: Performed at [facility], [place] [address] [place], [place] [address]  Comprehensive metabolic panel     Status: Abnormal   Collection Time: 10/16/21  5:44 AM  Result Value Ref Range   Sodium 135 135 - 145 mmol/L   Potassium 3.8 3.5 - 5.1 mmol/L   Chloride 102 98 - 111 mmol/L   CO2 25 22 - 32 mmol/L   Glucose, Bld 90 70 - 99 mg/dL    Comment: Glucose reference range applies only to samples taken after fasting for at least 8 hours.   BUN 13 8 - 23 mg/dL   Creatinine, Ser 0.50 0.44 - 1.00 mg/dL   Calcium 9.6 8.9 - 10.3 mg/dL   Total Protein 6.1 (L) 6.5 - 8.1 g/dL   Albumin 3.5 3.5 - 5.0 g/dL   AST 23 15 - 41 U/L   ALT 11 0 - 44 U/L   Alkaline Phosphatase 38 38 - 126 U/L   Total Bilirubin 0.8 0.3 - 1.2 mg/dL   GFR, Estimated >60 >60 mL/min    Comment: (NOTE) Calculated using the CKD-EPI Creatinine Equation (2021)    Anion gap 8 5 - 15    Comment: Performed at [facility], [place] [address] [place], [place] [address]   No results found.    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  The patient unfortunately has a malignant process of unclear etiology.  Her CT scan is concerning for possible obstruction secondary to colonic neoplasm; however, she had a negative colonoscopy in 2018 and has possible evidence of bony mets which is highly unusual for colon cancer.  It is possible this mass is causing extrinsic compression of the colon and is some other form of cancer such as recurrence of her breast cancer, primary peritoneal cancer, etc.  Either way, her obstruction is causing significant symptoms for her and if continues to worsen could cause colonic perforation.  A long discussion was had with the patient regarding all of her CT scan findings as well as surgical options.  We discussed the goal at this time would be diversion to restore bowel function as well as biopsy to determine what type of malignancy we are dealing with.  We also discussed venting gastrostomy tube placement prophylactically even if we can divert her as she is at risk for further obstructive processes moving forward based on her CT scan.  It  is also possible that she is scarred in so significantly with omental caking, malignancy, etc that we are unable to divert her at all.  If this were the case, we would do the best we could to access her stomach to place a venting g-tube for palliative reasons as well.  We discussed that the colostomy would be permanent in her case.  The patient and her husband both understood all of this.  Timing of surgery will be pending OR availability.  Early palliative care involvement to help with symptoms management and [place] discussions moving forward could be pursued, but would defer to oncology.  May want to wait for bx results and what can or can't be offered.  We will continue to follow and assist with care for this patient.  Discussed her  case with oncology in person.   FEN - NPO/IVFs VTE - ok for chemical prophylaxis from our standpoint ID - none currently needed  HTN Thyroid disease Remote history of breast cancer PACs - EKG done at bedside to assure no other arrhythmia    I reviewed Consultant oncology notes, hospitalist notes, last 24 h vitals and pain scores, last 48 h intake and output, last 24 h labs and trends, and last 24 h imaging results.  Henreitta Cea, [facility] Surgery 10/16/2021, 8:47 AM Please see Amion for pager number during day hours 7:00am-4:30pm or 7:00am -11:30am on weekends

## 2021-10-16 NOTE — Transfer of Care (Signed)
Immediate Anesthesia Transfer of Care Note  Patient: Nancy Harrison  Procedure(s) Performed: LAPAROSCOPIC  DIVERTING OSTOMY, BIOSPY,  G-TUBE PLACEMENT (Abdomen)  Patient Location: PACU  Anesthesia Type:General  Level of Consciousness: awake, alert , oriented and patient cooperative  Airway & Oxygen Therapy: Patient Spontanous Breathing and Patient connected to nasal cannula oxygen  Post-op Assessment: Report given to RN and Post -op Vital signs reviewed and stable  Post vital signs: Reviewed and stable  Last Vitals:  Vitals Value Taken Time  BP 116/73 10/16/21 2030  Temp    Pulse 84 10/16/21 2031  Resp 16 10/16/21 2031  SpO2 88 % 10/16/21 2031  Vitals shown include unvalidated device data.  Last Pain:  Vitals:   10/16/21 1621  TempSrc:   PainSc: 4          Complications: No notable events documented.

## 2021-10-16 NOTE — Progress Notes (Addendum)
HEMATOLOGY-ONCOLOGY PROGRESS NOTE  ASSESSMENT AND PLAN: Colon obstruction CT abdomen/pelvis 10/09/2021-irregular mass in the distal transverse colon with dilation of the transverse and right colon, and distal small bowel.  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  Bone lesions concerning for metastases, right lung nodule .  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  Lumpectomy and adjuvant CMF x8 followed by radiation Hypertension Hypothyroidism Barrett's esophagus Family history of multiple cancers including appendix, colon, and lung cancer  Nancy Harrison was admitted to the hospital due to concern for impending bowel obstruction.  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  This presentation would overall be unusual for colon cancer.  She could have carcinomatosis from another primary tumor site versus late recurrence of breast cancer.  The patient has been seen by general surgery this morning and they are considering diverting ostomy.  Will await their final recommendations.  They will attempt to get a biopsy while in the OR to help guide diagnosis and systemic treatment options.  Recommendations: 1.  Await final recommendations from general surgery. 2.  Await biopsy results. 3.  Further discussion with the patient and her family regarding systemic treatment options pending results of biopsy.  Nancy Harrison Nancy Harrison was interviewed and examined.  The abdomen is more distended this morning.  I discussed the case with Dr. Johney Maine.  He will see her to consider surgical intervention for the bowel obstruction.  She may have colon cancer with bowel obstruction may be extrinsic to the colon.  The differential diagnosis includes other primary gastrointestinal tumors and breast cancer.  I will follow-up on the surgical pathology and  make treatment recommendations.  I was present for greater than 50% of today's visit.  I performed medical decision making. SUBJECTIVE: Nancy Harrison was seen for initial consultation in our office yesterday for a colon mass.  She was also noted to have bone metastases on scan.  Case was discussed with general surgery who recommended admission for impending bowel obstruction.  This morning, the patient reports that she has some abdominal discomfort and bloating.  She is not currently having any nausea or vomiting.  No other complaints reported today.  PHYSICAL EXAMINATION:  Vitals:   10/16/21 0217 10/16/21 0601  BP: 119/62 (!) 149/85  Pulse: 71 81  Resp: 16 16  Temp: 98.7 F (37.1 C) 98.5 F (36.9 C)  SpO2: 98% 100%   There were no vitals filed for this visit.  Intake/Output from previous day: No intake/output data recorded.  Physical Exam Vitals reviewed.  Constitutional:      General: She is not in acute distress. HENT:     Head: Normocephalic.  Eyes:     General: No scleral icterus.    Conjunctiva/sclera: Conjunctivae normal.  Cardiovascular:     Rate and Rhythm: Normal rate.  Pulmonary:     Effort: Pulmonary effort is normal. No respiratory distress.  Abdominal:     Palpations: Abdomen is soft.     Comments: Mildly distended  Skin:    General: Skin is warm and dry.  Neurological:     Mental Status: She is alert and oriented to person, place, and time.     LABORATORY DATA:  I have reviewed the data as listed    Latest Ref Rng & Units 10/16/2021    5:44 AM 10/15/2021    7:24 PM 06/21/2013    2:02  PM  CMP  Glucose 70 - 99 mg/dL 90  125  106   BUN 8 - 23 mg/dL '13  14  18   ' Creatinine 0.44 - 1.00 mg/dL 0.50  0.62  0.70   Sodium 135 - 145 mmol/L 135  132  143   Potassium 3.5 - 5.1 mmol/L 3.8  3.8  3.8   Chloride 98 - 111 mmol/L 102  98  101   CO2 22 - 32 mmol/L 25  25    Calcium 8.9 - 10.3 mg/dL 9.6  9.4    Total Protein 6.5 - 8.1 g/dL 6.1  6.4    Total  Bilirubin 0.3 - 1.2 mg/dL 0.8  0.6    Alkaline Phos 38 - 126 U/L 38  43    AST 15 - 41 U/L 23  23    ALT 0 - 44 U/L 11  11      Lab Results  Component Value Date   WBC 5.9 10/16/2021   HGB 12.2 10/16/2021   HCT 36.1 10/16/2021   MCV 102.8 (H) 10/16/2021   PLT 303 10/16/2021   NEUTROABS 1.9 04/09/2011    No results found for: "CEA1", "CEA", "PXT062", "CA125", "PSA1"  CT ABDOMEN PELVIS W CONTRAST  Result Date: 10/09/2021 CLINICAL DATA:  Abdominal pain, diarrhea. EXAM: CT ABDOMEN AND PELVIS WITH CONTRAST TECHNIQUE: Multidetector CT imaging of the abdomen and pelvis was performed using the standard protocol following bolus administration of intravenous contrast. RADIATION DOSE REDUCTION: This exam was performed according to the departmental dose-optimization program which includes automated exposure control, adjustment of the mA and/or kV according to patient size and/or use of iterative reconstruction technique. CONTRAST:  61m ISOVUE-300 IOPAMIDOL (ISOVUE-300) INJECTION 61% COMPARISON:  None Available. FINDINGS: Lower chest: 13 x 11 mm nodule is noted in right lower lobe. Hepatobiliary: No focal liver abnormality is seen. No gallstones, gallbladder wall thickening, or biliary dilatation. Pancreas: Unremarkable. No pancreatic ductal dilatation or surrounding inflammatory changes. Spleen: Normal in size without focal abnormality. Adrenals/Urinary Tract: Adrenal glands are unremarkable. Small nonobstructive left renal calculus is noted. No hydronephrosis or renal obstruction is noted. Urinary bladder is unremarkable. Stomach/Bowel: The stomach appears normal. Severe dilatation of the right and proximal transverse colon is noted which appears to be severely obstructed by large irregular mass in the distal transverse colon most consistent with colonic malignancy. Mild amount of stool is seen in the more distal nondilated sigmoid colon. Sigmoid diverticulosis is noted without inflammation. There also  appears to be distal small bowel dilatation secondary to the colonic obstruction. Vascular/Lymphatic: Aortic atherosclerosis. Mildly enlarged periaortic adenopathy is noted which may represent metastatic disease. Reproductive: Uterus and bilateral adnexa are unremarkable. Other: Multiple peritoneal implants are noted, predominantly in the left side of the abdomen as well as omental caking, consistent with peritoneal carcinomatosis. Musculoskeletal: Multiple rounded lucencies are noted in both visualized proximal femurs concerning for metastatic disease. Multilevel degenerative disc disease is noted in the lumbar spine. IMPRESSION: There is a large irregular mass in the distal transverse colon most consistent with colonic malignancy. This causes severe obstruction of the more proximal transverse and right colon as well as the distal small bowel. Mild periaortic adenopathy is noted consistent with metastatic disease. Multiple large peritoneal implants are noted, particularly in the left side of the abdomen as well as omental caking, most consistent with peritoneal carcinomatosis. Multiple rounded lucencies are noted in the visualized femurs concerning for metastatic disease. These results will be called to the ordering clinician or representative  by the Radiologist Assistant, and communication documented in the PACS or zVision Dashboard. 13 x 11 mm nodule is noted in right lower lobe concerning for metastatic disease. Small nonobstructive left renal calculus. Aortic Atherosclerosis (ICD10-I70.0). Electronically Signed   By: Marijo Conception M.D.   On: 10/09/2021 15:29     Future Appointments  Date Time Provider [place]  10/22/2021 10:40 AM Ladell Pier, MD [facility] None      LOS: 1 day

## 2021-10-16 NOTE — Anesthesia Preprocedure Evaluation (Signed)
Anesthesia Evaluation  Patient identified by MRN, date of birth, ID band Patient awake    Reviewed: Allergy & Precautions, NPO status , Patient's Chart, lab work & pertinent test results  Airway Mallampati: II  TM Distance: >3 FB Neck ROM: Full    Dental  (+) Dental Advisory Given   Pulmonary former smoker,    breath sounds clear to auscultation       Cardiovascular hypertension, Pt. on medications  Rhythm:Regular Rate:Normal     Neuro/Psych negative neurological ROS     GI/Hepatic Neg liver ROS, GERD  ,Large abdominal mass with colonic obstruction and carcinomatosis   Endo/Other  negative endocrine ROS  Renal/GU negative Renal ROS     Musculoskeletal  (+) Arthritis ,   Abdominal   Peds  Hematology negative hematology ROS (+)   Anesthesia Other Findings   Reproductive/Obstetrics                             Lab Results  Component Value Date   WBC 5.9 10/16/2021   HGB 12.2 10/16/2021   HCT 36.1 10/16/2021   MCV 102.8 (H) 10/16/2021   PLT 303 10/16/2021   Lab Results  Component Value Date   CREATININE 0.50 10/16/2021   BUN 13 10/16/2021   NA 135 10/16/2021   K 3.8 10/16/2021   CL 102 10/16/2021   CO2 25 10/16/2021    Anesthesia Physical Anesthesia Plan  ASA: 3  Anesthesia Plan: General   Post-op Pain Management: Ofirmev IV (intra-op)* and Toradol IV (intra-op)*   Induction: Intravenous, Rapid sequence and Cricoid pressure planned  PONV Risk Score and Plan: 3 and Dexamethasone, Ondansetron and Treatment may vary due to age or medical condition  Airway Management Planned: Oral ETT  Additional Equipment:   Intra-op Plan:   Post-operative Plan: Extubation in OR and Possible Post-op intubation/ventilation  Informed Consent: I have reviewed the patients History and Physical, chart, labs and discussed the procedure including the risks, benefits and alternatives for the  proposed anesthesia with the patient or authorized representative who has indicated his/her understanding and acceptance.     Dental advisory given  Plan Discussed with: CRNA  Anesthesia Plan Comments:         Anesthesia Quick Evaluation

## 2021-10-16 NOTE — Progress Notes (Signed)
Pt. Came out 2014 pm from OR via stretcher, NG tube Rt, nare, G-tube Lt Abd aqnd colostomy bag @ Rt abdomen. While getting pain med, pt. Pulledout NG tube. Replacement to Lt. Nare, KUB done. Air patent. Applied mittens. Dr. Johney Maine wanted put abd binder, also NGT mat out tomorrow. Applied abd bider. Empty colostomy bag 265m.

## 2021-10-16 NOTE — Interval H&P Note (Signed)
History and Physical Interval Note:  10/16/2021 5:10 PM  Nancy Harrison  has presented today for surgery, with the diagnosis of Obstruction.  The various methods of treatment have been discussed with the patient and family. After consideration of risks, benefits and other options for treatment, the patient has consented to  Procedure(s): LAPAROSCOPIC POSSIBLE OPEN DIVERTING OSTOMY, BIOSPY, POSSIBLE G-TUBE, POSSIBLE PEG, POSSIBLE EGD, (N/A) as a surgical intervention.  The patient's history has been reviewed, patient examined, no change in status, stable for surgery.  I have reviewed the patient's chart and labs.  Questions were answered to the patient's satisfaction.    I have re-reviewed the the patient's records, history, medications, and allergies.  I have re-examined the patient.  I again discussed intraoperative plans and goals of post-operative recovery.  The patient agrees to proceed.  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    Past Medical History:  Diagnosis Date   Arthritis    Barrett's esophagus    Path 10/2001   Cancer ([place]) 1999   rt breast   Cataract    Hypertension    Thyroid disease     Past Surgical History:  Procedure Laterality Date   BREAST LUMPECTOMY  1999   Chemo and radiation on right breast   [place]    Social History   Socioeconomic History   Marital status: Married    Spouse name: Not on file   Number of children: Not on file   Years of education: Not on file    Highest education level: Not on file  Occupational History   Not on file  Tobacco Use   Smoking status: Former    Types: Cigarettes    Quit date: 02/23/1972    Years since quitting: 49.6   Smokeless tobacco: Never  Substance and Sexual Activity   Alcohol use: Yes    Alcohol/week: 8.0 standard drinks of alcohol    Types: 8 Glasses of wine per week   Drug use: No   Sexual activity: Not on file  Other Topics Concern   Not on file  Social History Narrative   Not on file   Social Determinants of Health   Financial Resource Strain: Not on file  Food Insecurity: Not on file  Transportation Needs: Not on file  Physical Activity: Not on file  Stress: Not on file  Social Connections: Not on file  Intimate Partner Violence: Not on file    Family History  Problem Relation Age of Onset   Cancer Mother        COLON   Colon cancer Mother 21   Cancer Sister        breast   Breast cancer Sister    Cancer Maternal Aunt        breast   Colon cancer Maternal Aunt    Stomach cancer Neg Hx    Rectal cancer Neg Hx    Esophageal cancer Neg Hx     Facility-Administered Medications Prior to Admission  Medication Dose Route  Frequency Provider Last Rate Last Admin   0.9 %  sodium chloride infusion  500 mL Intravenous Continuous Nandigam, Kavitha V, MD       Medications Prior to Admission  Medication Sig Dispense Refill Last Dose   bisoprolol-hydrochlorothiazide (ZIAC) 10-6.25 MG per tablet Take 1 tablet by mouth daily.     10/15/2021 at am   ibuprofen (ADVIL,MOTRIN) 200 MG tablet Take 400-600 mg by mouth daily as needed for mild pain. Aches and pain   Past Month   lisinopril (PRINIVIL,ZESTRIL) 10 MG tablet Take 10 mg by mouth daily.     10/15/2021 at am   loperamide (IMODIUM A-D) 2 MG tablet Take 2-4 mg by mouth 4 (four) times daily as needed for diarrhea or loose stools.   10/14/2021   loratadine (CLARITIN) 10 MG tablet Take 10 mg by mouth See admin instructions. Take 10 mg by mouth one to  two times a day as needed for allergies   10/15/2021 at am   Multiple Vitamin (MULTIVITAMIN PO) Take 1 tablet by mouth daily with breakfast.   10/15/2021 at am   simethicone (MYLICON) 034 MG chewable tablet Chew 125 mg by mouth every 6 (six) hours as needed for flatulence.   Past Week    Current Facility-Administered Medications  Medication Dose Route Frequency Provider Last Rate Last Admin   0.9 %  sodium chloride infusion   Intravenous Continuous Maryanna Shape, NP 75 mL/hr at 10/16/21 0943 New Bag at 10/16/21 0943   [MAR Hold] acetaminophen (TYLENOL) tablet 650 mg  650 mg Oral Q6H PRN Marcelyn Bruins, MD       Or   [facility] Hold] acetaminophen (TYLENOL) suppository 650 mg  650 mg Rectal Q6H PRN Marcelyn Bruins, MD       [MAR Hold] bisoprolol (ZEBETA) tablet 10 mg  10 mg Oral Daily Marcelyn Bruins, MD       [facility] Hold] bupivacaine liposome (EXPAREL) 1.3 % injection 266 mg  20 mL Infiltration On Call to OR Michael Boston, MD       [facility] Hold] cefoTEtan (CEFOTAN) 2 g in sodium chloride 0.9 % 100 mL IVPB  2 g Intravenous On Call to OR Michael Boston, MD       lactated ringers infusion   Intravenous Continuous Suzette Battiest, MD 10 mL/hr at 10/16/21 1640 New Bag at 10/16/21 1640   [MAR Hold] lisinopril (ZESTRIL) tablet 10 mg  10 mg Oral Daily Marcelyn Bruins, MD       [MAR Hold] polyethylene glycol (MIRALAX / GLYCOLAX) packet 17 g  17 g Oral Daily PRN Marcelyn Bruins, MD       [MAR Hold] simethicone [facility]) chewable tablet 80 mg  80 mg Oral Q6H PRN Marcelyn Bruins, MD   80 mg at 10/16/21 0948   [facility] Hold] sodium chloride flush (NS) 0.9 % injection 3 mL  3 mL Intravenous Q12H Marcelyn Bruins, MD   3 mL at 10/16/21 7425     Allergies  Allergen Reactions   Sporanox [Itraconazole] Rash    BP 121/80 (BP Location: Left Arm)   Pulse 82   Temp 98.2 F (36.8 C) (Oral)   Resp 15   Ht '4\' 11"'$  (1.499 m)   Wt 57 kg   SpO2 94%   BMI 25.38 kg/m   Labs: Results for  orders placed or performed during the hospital encounter of 10/15/21 (from the past 48 hour(s))  CBC     Status: Abnormal  Collection Time: 10/15/21  7:24 PM  Result Value Ref Range   WBC 6.1 4.0 - 10.5 K/uL   RBC 3.41 (L) 3.87 - 5.11 MIL/uL   Hemoglobin 12.0 12.0 - 15.0 g/dL   HCT 35.4 (L) 36.0 - 46.0 %   MCV 103.8 (H) 80.0 - 100.0 fL   MCH 35.2 (H) 26.0 - 34.0 pg   MCHC 33.9 30.0 - 36.0 g/dL   RDW 13.2 11.5 - 15.5 %   Platelets 359 150 - 400 K/uL   nRBC 0.0 0.0 - 0.2 %    Comment: Performed at [facility], [place] [address] [place], [place] [address]  Comprehensive metabolic panel     Status: Abnormal   Collection Time: 10/15/21  7:24 PM  Result Value Ref Range   Sodium 132 (L) 135 - 145 mmol/L   Potassium 3.8 3.5 - 5.1 mmol/L   Chloride 98 98 - 111 mmol/L   CO2 25 22 - 32 mmol/L   Glucose, Bld 125 (H) 70 - 99 mg/dL    Comment: Glucose reference range applies only to samples taken after fasting for at least 8 hours.   BUN 14 8 - 23 mg/dL   Creatinine, Ser 0.62 0.44 - 1.00 mg/dL   Calcium 9.4 8.9 - 10.3 mg/dL   Total Protein 6.4 (L) 6.5 - 8.1 g/dL   Albumin 3.5 3.5 - 5.0 g/dL   AST 23 15 - 41 U/L   ALT 11 0 - 44 U/L   Alkaline Phosphatase 43 38 - 126 U/L   Total Bilirubin 0.6 0.3 - 1.2 mg/dL   GFR, Estimated >60 >60 mL/min    Comment: (NOTE) Calculated using the CKD-EPI Creatinine Equation (2021)    Anion gap 9 5 - 15    Comment: Performed at [facility], [place] [address] [place], [place] [address]  CBC     Status: Abnormal   Collection Time: 10/16/21  5:44 AM  Result Value Ref Range   WBC 5.9 4.0 - 10.5 K/uL   RBC 3.51 (L) 3.87 - 5.11 MIL/uL   Hemoglobin 12.2 12.0 - 15.0 g/dL   HCT 36.1 36.0 - 46.0 %   MCV 102.8 (H) 80.0 - 100.0 fL   MCH 34.8 (H) 26.0 - 34.0 pg   MCHC 33.8 30.0 - 36.0 g/dL   RDW 13.2 11.5 - 15.5 %   Platelets 303 150 - 400 K/uL   nRBC 0.0 0.0 - 0.2 %    Comment: Performed at [facility],  [place] [address] [place], [place] [address]  Comprehensive metabolic panel     Status: Abnormal   Collection Time: 10/16/21  5:44 AM  Result Value Ref Range   Sodium 135 135 - 145 mmol/L   Potassium 3.8 3.5 - 5.1 mmol/L   Chloride 102 98 - 111 mmol/L   CO2 25 22 - 32 mmol/L   Glucose, Bld 90 70 - 99 mg/dL    Comment: Glucose reference range applies only to samples taken after fasting for at least 8 hours.   BUN 13 8 - 23 mg/dL   Creatinine, Ser 0.50 0.44 - 1.00 mg/dL   Calcium 9.6 8.9 - 10.3 mg/dL   Total Protein 6.1 (L) 6.5 - 8.1 g/dL   Albumin 3.5 3.5 - 5.0 g/dL   AST 23 15 - 41 U/L   ALT 11 0 - 44 U/L   Alkaline Phosphatase 38 38 - 126 U/L   Total Bilirubin 0.8 0.3 - 1.2  mg/dL   GFR, Estimated >60 >60 mL/min    Comment: (NOTE) Calculated using the CKD-EPI Creatinine Equation (2021)    Anion gap 8 5 - 15    Comment: Performed at [facility], [place] [address], [place], [place] [address]  ABO/Rh     Status: None   Collection Time: 10/16/21  5:44 AM  Result Value Ref Range   ABO/RH(D)      A POS Performed at [facility], [place] [address] [place], [place] [address]   Type and screen     Status: None (Preliminary result)   Collection Time: 10/16/21  4:30 PM  Result Value Ref Range   ABO/RH(D) PENDING    Antibody Screen PENDING    Sample Expiration      10/19/2021,2359 Performed at [facility], [place] [address], [place],  [address]     Imaging / Studies: DG Abd Portable 1V  Result Date: 10/16/2021 CLINICAL DATA:  Large bowel obstruction. EXAM: PORTABLE ABDOMEN - 1 VIEW COMPARISON:  CT of the abdomen and pelvis of October 09, 2021 FINDINGS: Transverse colon is slightly more dilated than on the most recent comparison at 8.7 cm though is less dilated than on the study of August 07, 2021 where reach 11 cm. There is some gas and stool in the rectum. Stool is seen in the collapsed descending colon. There are dilated  centrally located small bowel loops with increase, some of these bowel loops may reach 5 cm or slightly greater. IMPRESSION: Persistent colonic obstruction. Caliber at 8.7 cm while diminished since June is slightly increased since August 18. At this caliber or above the patient may be at increased risk for perforation. Increasing small bowel distension in the central abdomen. In this patient with known colonic obstruction and peritoneal disease. Electronically Signed   By: Zetta Bills M.D.   On: 10/16/2021 08:28   CT ABDOMEN PELVIS W CONTRAST  Result Date: 10/09/2021 CLINICAL DATA:  Abdominal pain, diarrhea. EXAM: CT ABDOMEN AND PELVIS WITH CONTRAST TECHNIQUE: Multidetector CT imaging of the abdomen and pelvis was performed using the standard protocol following bolus administration of intravenous contrast. RADIATION DOSE REDUCTION: This exam was performed according to the departmental dose-optimization program which includes automated exposure control, adjustment of the mA and/or kV according to patient size and/or use of iterative reconstruction technique. CONTRAST:  55m ISOVUE-300 IOPAMIDOL (ISOVUE-300) INJECTION 61% COMPARISON:  None Available. FINDINGS: Lower chest: 13 x 11 mm nodule is noted in right lower lobe. Hepatobiliary: No focal liver abnormality is seen. No gallstones, gallbladder wall thickening, or biliary dilatation. Pancreas: Unremarkable. No pancreatic ductal dilatation or surrounding inflammatory changes. Spleen: Normal in size without focal abnormality. Adrenals/Urinary Tract: Adrenal glands are unremarkable. Small nonobstructive left renal calculus is noted. No hydronephrosis or renal obstruction is noted. Urinary bladder is unremarkable. Stomach/Bowel: The stomach appears normal. Severe dilatation of the right and proximal transverse colon is noted which appears to be severely obstructed by large irregular mass in the distal transverse colon most consistent with colonic malignancy. Mild  amount of stool is seen in the more distal nondilated sigmoid colon. Sigmoid diverticulosis is noted without inflammation. There also appears to be distal small bowel dilatation secondary to the colonic obstruction. Vascular/Lymphatic: Aortic atherosclerosis. Mildly enlarged periaortic adenopathy is noted which may represent metastatic disease. Reproductive: Uterus and bilateral adnexa are unremarkable. Other: Multiple peritoneal implants are noted, predominantly in the left side of the abdomen as well as omental caking, consistent with peritoneal carcinomatosis.  Musculoskeletal: Multiple rounded lucencies are noted in both visualized proximal femurs concerning for metastatic disease. Multilevel degenerative disc disease is noted in the lumbar spine. IMPRESSION: There is a large irregular mass in the distal transverse colon most consistent with colonic malignancy. This causes severe obstruction of the more proximal transverse and right colon as well as the distal small bowel. Mild periaortic adenopathy is noted consistent with metastatic disease. Multiple large peritoneal implants are noted, particularly in the left side of the abdomen as well as omental caking, most consistent with peritoneal carcinomatosis. Multiple rounded lucencies are noted in the visualized femurs concerning for metastatic disease. These results will be called to the ordering clinician or representative by the Radiologist Assistant, and communication documented in the PACS or zVision Dashboard. 13 x 11 mm nodule is noted in right lower lobe concerning for metastatic disease. Small nonobstructive left renal calculus. Aortic Atherosclerosis (ICD10-I70.0). Electronically Signed   By: Marijo Conception M.D.   On: 10/09/2021 15:29     .Adin Hector, M.D., F.A.C.S. Gastrointestinal and Minimally Invasive Surgery [facility] [address] [place], [place] [id number] [phone] Main /  Paging  10/16/2021 5:10 PM    Adin Hector

## 2021-10-16 NOTE — H&P (View-Only) (Signed)
Nancy Harrison [birth year]  [id number].    Requesting MD: Dr. Julieanne Manson Chief Complaint/Reason for Consult: colonic obstruction secondary to mass  HPI:  This is a very pleasant 81 yo white female with a history of HTN and thyroid disease who has been in her normal state of health until about 2-3 months ago when she started to develop abdominal distention with a change in her bowel habits.  Her stools became diarrhea about every 3-4 days.  She would have about 4 episodes on these days and nothing in between.  She denies any N/V.  She denies any blood in her stool.  She had a colonoscopy in 2018 with a hyperplastic polyp but no other findings.  She does have a remote history of breast cancer in 1999 that she underwent a lumpectomy for and then chemo/radiation.  Of note, her daughter has goblet cell appendiceal cancer, mom diet of colon cancer, and sister has stage IV breast cancer.  The patient has had a poor appetite and decrease oral intake during this time period.  She has lost about 10 lbs.    She went to see her PCP last week for the above symptoms and underwent a CT scan.  This scan revealed a large irregular mass in the distal transverse colon most c/w colonic malignancy causing obstruction of the rest of the colon.  Mild periaortic adenopathy noted c/w metastatic disease.  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  There are also multiple lucencies in her femurs concerning for bony mets.  A RLL nodule is noted as well concerning for metastatic disease as well.  Given her obstructive symptoms, we have been asked to see her for further evaluation and recommendations.  ROS: ROS: see HPI  Family History  Problem Relation Age of Onset   Cancer Mother        COLON   Colon cancer Mother 38   Cancer Sister        breast   Breast cancer Sister    Cancer Maternal Aunt        breast   Colon cancer Maternal Aunt    Stomach cancer Neg  Hx    Rectal cancer Neg Hx    Esophageal cancer Neg Hx     Past Medical History:  Diagnosis Date   Arthritis    Barrett's esophagus    Path 10/2001   Cancer ([place]) 1999   rt breast   Cataract    Hypertension    Thyroid disease     Past Surgical History:  Procedure Laterality Date   BREAST LUMPECTOMY  1999   Chemo and radiation on right breast   [place] & 2001    Social History:  reports that she quit smoking about 49 years ago. She has never used smokeless tobacco. She reports current alcohol use of about 8.0 standard drinks of alcohol per week. She reports that she does not use drugs.  Allergies:  Allergies  Allergen Reactions   Sporanox [Itraconazole] Rash    Facility-Administered Medications Prior to Admission  Medication Dose Route Frequency Provider Last Rate Last Admin   0.9 %  sodium chloride infusion  500 mL Intravenous Continuous Nandigam, Kavitha V, MD       Medications Prior to Admission  Medication Sig Dispense Refill   bisoprolol-hydrochlorothiazide (ZIAC) 10-6.25 MG per tablet Take 1 tablet by mouth daily.  ibuprofen (ADVIL,MOTRIN) 200 MG tablet Take 400-600 mg by mouth daily as needed for mild pain. Aches and pain     lisinopril (PRINIVIL,ZESTRIL) 10 MG tablet Take 10 mg by mouth daily.       loperamide (IMODIUM A-D) 2 MG tablet Take 2-4 mg by mouth 4 (four) times daily as needed for diarrhea or loose stools.     loratadine (CLARITIN) 10 MG tablet Take 10 mg by mouth See admin instructions. Take 10 mg by mouth one to two times a day as needed for allergies     Multiple Vitamin (MULTIVITAMIN PO) Take 1 tablet by mouth daily with breakfast.     simethicone (MYLICON) 194 MG chewable tablet Chew 125 mg by mouth every 6 (six) hours as needed for flatulence.       Physical Exam: Blood pressure (!) 149/85, pulse 81, temperature 98.5 F (36.9 C), temperature source Oral, resp. rate 16, SpO2 100 %. General:  pleasant, WD, WN white female who is laying in bed in NAD HEENT: head is normocephalic, atraumatic.  Sclera are noninjected.  PERRL.  Ears and nose without any masses or lesions.  Mouth is pink and moist Heart: regular, rate, and rhythm, but with some ectopic beats.  Normal s1,s2. No obvious murmurs, gallops, or rubs noted.  Palpable radial and pedal pulses bilaterally Lungs: CTAB, no wheezes, rhonchi, or rales noted.  Respiratory effort nonlabored Abd: soft, minimal tenderness in upper abdomen, distended in upper abdomen with some tympany, +BS, no masses, hernias, or organomegaly, but some fullness noted on left side of her abdomen, but no definitive mass noted. MS: all 4 extremities are symmetrical with no cyanosis, clubbing, or edema. Skin: warm and dry with no masses, lesions, or rashes Neuro: Cranial nerves 2-12 grossly intact, sensation is normal throughout Psych: A&Ox3 with an appropriate affect.   Results for orders placed or performed during the hospital encounter of 10/15/21 (from the past 48 hour(s))  CBC     Status: Abnormal   Collection Time: 10/15/21  7:24 PM  Result Value Ref Range   WBC 6.1 4.0 - 10.5 K/uL   RBC 3.41 (L) 3.87 - 5.11 MIL/uL   Hemoglobin 12.0 12.0 - 15.0 g/dL   HCT 35.4 (L) 36.0 - 46.0 %   MCV 103.8 (H) 80.0 - 100.0 fL   MCH 35.2 (H) 26.0 - 34.0 pg   MCHC 33.9 30.0 - 36.0 g/dL   RDW 13.2 11.5 - 15.5 %   Platelets 359 150 - 400 K/uL   nRBC 0.0 0.0 - 0.2 %    Comment: Performed at [facility], [place] [address] [place], [place] [address]  Comprehensive metabolic panel     Status: Abnormal   Collection Time: 10/15/21  7:24 PM  Result Value Ref Range   Sodium 132 (L) 135 - 145 mmol/L   Potassium 3.8 3.5 - 5.1 mmol/L   Chloride 98 98 - 111 mmol/L   CO2 25 22 - 32 mmol/L   Glucose, Bld 125 (H) 70 - 99 mg/dL    Comment: Glucose reference range applies only to samples taken after fasting for at least 8 hours.   BUN 14 8 - 23 mg/dL    Creatinine, Ser 0.62 0.44 - 1.00 mg/dL   Calcium 9.4 8.9 - 10.3 mg/dL   Total Protein 6.4 (L) 6.5 - 8.1 g/dL   Albumin 3.5 3.5 - 5.0 g/dL   AST 23 15 - 41 U/L   ALT 11 0 - 44 U/L  Alkaline Phosphatase 43 38 - 126 U/L   Total Bilirubin 0.6 0.3 - 1.2 mg/dL   GFR, Estimated >60 >60 mL/min    Comment: (NOTE) Calculated using the CKD-EPI Creatinine Equation (2021)    Anion gap 9 5 - 15    Comment: Performed at [facility], [place] [address], [place], [place] [address]  CBC     Status: Abnormal   Collection Time: 10/16/21  5:44 AM  Result Value Ref Range   WBC 5.9 4.0 - 10.5 K/uL   RBC 3.51 (L) 3.87 - 5.11 MIL/uL   Hemoglobin 12.2 12.0 - 15.0 g/dL   HCT 36.1 36.0 - 46.0 %   MCV 102.8 (H) 80.0 - 100.0 fL   MCH 34.8 (H) 26.0 - 34.0 pg   MCHC 33.8 30.0 - 36.0 g/dL   RDW 13.2 11.5 - 15.5 %   Platelets 303 150 - 400 K/uL   nRBC 0.0 0.0 - 0.2 %    Comment: Performed at [facility], [place] [address], [place], [place] [address]  Comprehensive metabolic panel     Status: Abnormal   Collection Time: 10/16/21  5:44 AM  Result Value Ref Range   Sodium 135 135 - 145 mmol/L   Potassium 3.8 3.5 - 5.1 mmol/L   Chloride 102 98 - 111 mmol/L   CO2 25 22 - 32 mmol/L   Glucose, Bld 90 70 - 99 mg/dL    Comment: Glucose reference range applies only to samples taken after fasting for at least 8 hours.   BUN 13 8 - 23 mg/dL   Creatinine, Ser 0.50 0.44 - 1.00 mg/dL   Calcium 9.6 8.9 - 10.3 mg/dL   Total Protein 6.1 (L) 6.5 - 8.1 g/dL   Albumin 3.5 3.5 - 5.0 g/dL   AST 23 15 - 41 U/L   ALT 11 0 - 44 U/L   Alkaline Phosphatase 38 38 - 126 U/L   Total Bilirubin 0.8 0.3 - 1.2 mg/dL   GFR, Estimated >60 >60 mL/min    Comment: (NOTE) Calculated using the CKD-EPI Creatinine Equation (2021)    Anion gap 8 5 - 15    Comment: Performed at [facility], [place] [address] [place],  [address]   No results found.    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  The patient unfortunately has a malignant process of unclear etiology.  Her CT scan is concerning for possible obstruction secondary to colonic neoplasm; however, she had a negative colonoscopy in 2018 and has possible evidence of bony mets which is highly unusual for colon cancer.  It is possible this mass is causing extrinsic compression of the colon and is some other form of cancer such as recurrence of her breast cancer, primary peritoneal cancer, etc.  Either way, her obstruction is causing significant symptoms for her and if continues to worsen could cause colonic perforation.  A long discussion was had with the patient regarding all of her CT scan findings as well as surgical options.  We discussed the goal at this time would be diversion to restore bowel function as well as biopsy to determine what type of malignancy we are dealing with.  We also discussed venting gastrostomy tube placement prophylactically even if we can divert her as she is at risk for further obstructive processes moving forward based on her CT scan.  It  is also possible that she is scarred in so significantly with omental caking, malignancy, etc that we are unable to divert her at all.  If this were the case, we would do the best we could to access her stomach to place a venting g-tube for palliative reasons as well.  We discussed that the colostomy would be permanent in her case.  The patient and her husband both understood all of this.  Timing of surgery will be pending OR availability.  Early palliative care involvement to help with symptoms management and [place] discussions moving forward could be pursued, but would defer to oncology.  May want to wait for bx results and what can or can't be offered.  We will continue to follow and assist with care for this patient.  Discussed her  case with oncology in person.   FEN - NPO/IVFs VTE - ok for chemical prophylaxis from our standpoint ID - none currently needed  HTN Thyroid disease Remote history of breast cancer PACs - EKG done at bedside to assure no other arrhythmia    I reviewed Consultant oncology notes, hospitalist notes, last 24 h vitals and pain scores, last 48 h intake and output, last 24 h labs and trends, and last 24 h imaging results.  Henreitta Cea, [facility] 10/16/2021, 8:47 AM Please see Amion for pager number during day hours 7:00am-4:30pm or 7:00am -11:30am on weekends

## 2021-10-16 NOTE — Telephone Encounter (Signed)
TC from Pt's husband to request any future plans of surgery be discussed with Dr. Clovis Riley at [facility]. Discussed with Dr Benay Spice who is aware of the matter.

## 2021-10-16 NOTE — Anesthesia Procedure Notes (Signed)
Procedure Name: Intubation Date/Time: 10/16/2021 5:48 PM  Performed by: Montel Clock, CRNAPre-anesthesia Checklist: Patient identified, Emergency Drugs available, Suction available, Patient being monitored and Timeout performed Patient Re-evaluated:Patient Re-evaluated prior to induction Oxygen Delivery Method: Circle system utilized Preoxygenation: Pre-oxygenation with 100% oxygen Induction Type: IV induction, Rapid sequence and Cricoid Pressure applied Laryngoscope Size: 3 and Glidescope Grade View: Grade II Tube type: Oral Tube size: 7.0 mm Number of attempts: 1 Airway Equipment and Method: Rigid stylet Placement Confirmation: ETT inserted through vocal cords under direct vision, positive ETCO2 and breath sounds checked- equal and bilateral Secured at: 21 cm Tube secured with: Tape Dental Injury: Teeth and Oropharynx as per pre-operative assessment  Comments: Recessed lower teeth, poor oral opening and teeth in way of blade, however ETT passed though cords without difficulty.

## 2021-10-16 NOTE — Op Note (Signed)
10/16/2021  8:05 PM  PATIENT:  Nancy Harrison  81 y.o. female  Patient Care Team: Nancy Loader, FNP as PCP - General (Family Medicine)  PRE-OPERATIVE DIAGNOSIS:   Colon Obstruction Carcinomatosis  POST-OPERATIVE DIAGNOSIS:   DISTAL TRANSVERSE COLON OBSTRUCTION CARCINOMATOSIS  PROCEDURE:   LAPAROSCOPIC  DIVERTING LOOP ILEOSTOMY LAPAROSCOPIC GASTROSTOMY TUBE PLACEMENT DIAGNOSTIC LAPAROSCOPY PERITONEAL BIOPSY  SURGEON:  Adin Hector, MD  ASSISTANT: OR Staff   ANESTHESIA:   local and general  EBL:  Total I/O In: 100 [I.V.:100] Out: 175 [Urine:100; Blood:75]  Delay start of Pharmacological VTE agent (>24hrs) due to surgical blood loss or risk of bleeding:  no  DRAINS:  G Tube  (24Fr MicKey balloon) Red robinson in distal (inferior limb of loop ileostomy)  SPECIMEN:   Nodules on peritoneum of lesser sac of stomach (carcinomatosis) Ascites for cytology  DISPOSITION OF SPECIMEN:  Pathology/cytology  COUNTS:  YES  PLAN OF CARE: Admit to inpatient   PATIENT DISPOSITION:  PACU - hemodynamically stable.  IINDICATION: Patient reporting abdominal pain and bowel changes and found to have diffuse peritoneal nodularity and bulky mass in mid/distal transverse colon causing obstruction.  Concerning for carcinomatosis due to uncertain malignant etiology.  Recommendation made for laparoscopic possible open exploration, biopsy, palliative venting gastrostomy tube, diverting ostomy proximal to the major obstruction.   Discussed with the patient.  The anatomy and physiology of the digestive tract was explained. Risks such as bleeding, infection, stroke, heart attack, and death were discussed. Risks of injury to other organs such as intestines were discussed. Long-term issues of catheter occlusion, leak, skin irritation, falling out, need for replacement, and others were discussed. I noted a good likelihood this will help address the problem. Questions answered the patient agrees  to proceed   OR FINDINGS:  Diffuse carcinomatosis on parietal and visceral peritoneum in all 4 quadrants and down on pelvis.    Epicenter of bulky cratered white fibrotic large mass at distal transverse colon.  Appendix normal with secondary few dots of carcinomatosis.    Bilateral adnexa (fallopian tube and ovaries) and uterus postmenopausal atrophic with no mass tumor or nodularity.    Stomach with some nodules along the lesser sac and greater omentum but not particularly on the stomach itself argue against gastric etiology.    Liver without any obvious major masses.    Gallbladder with secondary coverage as well.  54 French MIC key laparoscopically placed in distal antrum that could reach below the left costal margin after some dissection.  Secured with PDS internally and Prolene externally.  10 mm balloon filled with water  Small bowel with dots of carcinomatosis but no obvious obstruction there.  Distal ileal diverting loop ileostomy placed.  Proximal end is superior/cephalad.  Distal end is inferior at "6:00".  Large red Robinson catheter placed in distal end of ileostomy to decompress a large volume of flatus c/w retrograde proximal right colon decompression & improvement in distention.  DESCRIPTION:  Informed consent was confirmed. The patient underwent general anaesthesia without difficulty. The patient was positioned appropriately. VTE prevention in place. The patient's abdomen was clipped, prepped, & draped in a sterile fashion. Surgical timeout confirmed our plan.   The patient was positioned in reverse Trendelenburg. Abdominal entry with a 50m laparoscopic port was gained using optical entry technique in the right upper abdomen. Entry was clean. I induced carbon dioxide insufflation. Camera inspection revealed no injury. Extra ports were carefully placed under direct laparoscopic visualization.   Diagnostic laparoscopy revealed miliary carcinomatosis  coating much of the  visceral and parietal peritoneum.  Worse in central and left upper abdomen.  Proximal ascending and transverse colon massively distended.  Large white fibrotic cratered mass at the distal transverse colon the point of colon obstruction.  All of small intestine proximally dilated.  Miliary carcinomatosis on the small intestine but not large volume and certainly not obstructing.  Mobile.  Able to find distal ileum mobile enough to reach to premarked site.  Stomach somewhat retracted in the left upper quadrant but able to get enough mobility to get in left subcostal ridge.  Intraoperative findings as noted above.  Appendix and adnexa underwhelming and not suspicious for the primary source.  I felt I could proceed with gastrostomy tube and diverting loop ileostomy.  I chose a region on the hepatogastric ligament with had a lot of nodularities just medial to the lesser sac.  Excised moderate volume of that about the size of a chicken egg.  That helped have the distal stomach clear.  I focused on gastrostomy tube placement.  I proceeded to place 2-0 PDS interrupted sutures on the stomach to left upper quadrant parietal peritoneum in a region that did not have hardly any carcinomatosis.  I tied the sutures down using an extracorporeal knot pusher x4, leaving the inferior medial 1 in place.  I came through the skin transversely and left upper quadrant and placed a trocar for 5 mm port site through the fascia.  Came through the stomach at the center of the sutures and was able to do a focused gastrotomy.  Dilated the tract with a Kelly clamp.  Past the 24 French MIC key through the abdominal wall into the premarked gastrotomy with the help of a gentle blunt grasper.  I tied the last PDS suture down.  That why there was good stomach to parietal peritoneal opposition in a pentagonal pattern giving a good seal.  I blew up the balloon on the stomach with sterile water and pulled up.  Had good opposition.  The gastrostomy  tube flowed and aspirated quite easily. I did laparoscopic inspection. Saw no injury or other problems elsewhere.  They are tight seal with no leak.  I milked the flange down to be snug but not tight.  I did use 0 Prolene sutures through the anterior abdominal wall skin.  Brought the tails of each suture through the holes on the flange and tied loosely to help secure the flange to the skin.  Also wrapped the tails around the G-tube itself.  Did this with 3 interrupted sutures to good result.  Next I focused on diverting loop ileostomy.  Found the area in the distal ileum that could easily reach up to the premarked area in the right super umbilical paramedian region.  Evacuated carbon dioxide through the ports.  I removed the 12 mm port site that had been using there.  Excised a 3 cm circular disc of skin and subcutaneous fat.  Opened up the fascia somewhat transversely.  I placed a very small wound protector in there and brought the loop of ileum up.  Make sure he had good proper orientation.  The port sites were closed using 4 Monocryl stitch.  Sterile dressings applied.  Gastritis tube was protected.  I matured the ileostomy.  I used 2-0 silk sutures at the level of the fascia to the distal and proximal limbs of the loop ileostomy.  2 for each side.  Tied that down.  Then created a Brooke type ileostomy  using interrupted 2-0 Vicryl suture to good result.  Focus so that the distal decompressing limb was inferior at 6:00 involving 20% of the circumference.  Proximal limb offering the majority.  Did finger intubation and confirmed there was no twisting or turning and did decompression.  Placed a large [place] red Robinson catheter through the distal limb and was able to decompress a lot of latus and liquid stool with the abdomen decompressing consistent with help in decompressing the massively distended ascending and proximal transverse colon.  Ostomy appliance placed.  Patient extubated recovery room.   Initially confused and agitated and tried to pull the nasogastric tube partially out.  Able to be readvanced.  She is alert and following commands.  Cannot find the husband in person but I was able to call and reach him.  I discussed operative findings, updated the patient's status, discussed probable steps to recovery, and gave postoperative recommendations to the patient's spouse, Nancy Harrison.  Recommendations were made.  Questions were answered.  He expressed understanding & appreciation.  Adin Hector, MD, FACS, MASCRS Esophageal, Gastrointestinal & Colorectal Surgery Robotic and Minimally Invasive Surgery  [facility] [address] [place], [place] [address] [phone] Fax

## 2021-10-16 NOTE — Progress Notes (Signed)
Consent obtained and placed in chart

## 2021-10-16 NOTE — Progress Notes (Addendum)
PROGRESS NOTE   Nancy Harrison  WSF:[id number]    DOB: 11/23/1940    DOA: 10/15/2021  PCP: Kristen Loader, FNP   I have briefly reviewed patients previous medical records in [facility].  Chief complaint Obstructing colonic mass   Brief Narrative:  81 year old female with remote history of breast cancer, s/p lumpectomy and chemoradiation in the 90s, HTN, HLD, GERD, goiter, seen at the cancer center on 10/15/2021, CT abdomen and pelvis from 10/09/2021 and her clinical picture were consistent with colonic obstruction secondary to malignancy and she was directed to the [facility] long ED for admission.  General surgery and medical oncology have consulted.  General surgery plan to take her to the OR for palliative tumor resection, diverting colostomy and cytopathology.   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  Patient's PCP ordered a CT which showed a right lower lobe lung nodule and dilatation of the transverse colon with a mass at the distal transverse colon consistent with malignancy, lymphadenopathy and omental caking.  She was then referred to medical oncology, seen at the cancer center on 8/24, clinical and radiological picture consistent with bowel obstruction and patient was transferred to the hospital for admission.  Medical oncology and general surgery have consulted.  General surgery plan to take to the OR for palliative tumor resection, diverting colostomy and cytopathology samples.  DD of the tumor: Colon cancer versus breast cancer with mets versus others.  Preop EKG shows sinus rhythm, some baseline artifact, no acute changes and normal QTc.  Patient is at least a moderate risk for perioperative CV events given her  advanced age, frail physical health, likely need for extensive abdominal surgery but has no prior cardiopulmonary disease history, no symptoms, may proceed for much indicated procedure without any further cardiac evaluation.  Close perioperative monitoring.  Hypertension: Discontinue home thiazide diuretic.  For now continue lisinopril and Zebeta.  However postop, there is a high chance of ileus and if she remains n.p.o. for extended periods, treat parenterally with as needed hydralazine IV.  Remote history of breast cancer: Some concern that above is recurrence of breast cancer.  Hopefully cytopathology will help.  Hyperlipidemia GERD Anxiety Not on meds for above.  There is no height or weight on file to calculate BMI.   DVT prophylaxis: SCD's Start: 10/16/21 1058 SCDs Start: 10/15/21 1853     Code Status: Full Code:  Family Communication: Spouse at bedside Disposition:  Status is: Inpatient Remains inpatient appropriate because: Need for surgery as noted above     Consultants:   Medical oncology General surgery  Procedures:     Antimicrobials:      Subjective:  Seen this morning prior to procedure.  Spouse at bedside.  Reports prolonged history of abdominal distention, intermittent diarrhea and some pain.  Denies history of COPD, asthma, cardiac history such as MI, CAD, CHF.  No at rest or exertional chest pain, dyspnea, dizziness, lightheadedness or passing out.  Objective:   Vitals:   10/15/21 2224 10/16/21 0217 10/16/21 0601 10/16/21 1336  BP: 120/67 119/62 (!) 149/85 123/81  Pulse: 74 71 81 81  Resp: '18 16 16 15  '$ Temp: 99 F (37.2 C) 98.7 F (37.1 C) 98.5 F (36.9 C) 98.4 F (36.9 C)  TempSrc:  Oral Oral Oral Oral  SpO2: 96% 98% 100% 94%    General exam: Elderly female, small built, frail lying comfortably propped up in bed without distress.  Oral mucosa moist. Respiratory system: Clear to auscultation. Respiratory effort normal. Cardiovascular system:  S1 & S2 heard, RRR. No JVD, murmurs, rubs, gallops or clicks. No pedal edema. Gastrointestinal system: Abdomen is moderately distended, soft, not tense or tender.  Irregular masses felt that different places in the abdomen but especially in the epigastrium/left upper quadrant. Central nervous system: Alert and oriented. No focal neurological deficits. Extremities: Symmetric 5 x 5 power. Skin: No rashes, lesions or ulcers Psychiatry: Judgement and insight appear normal. Mood & affect appropriate.     Data Reviewed:   I have personally reviewed following labs and imaging studies   CBC: Recent Labs  Lab 10/15/21 1924 10/16/21 0544  WBC 6.1 5.9  HGB 12.0 12.2  HCT 35.4* 36.1  MCV 103.8* 102.8*  PLT 359 702    Basic Metabolic Panel: Recent Labs  Lab 10/15/21 1924 10/16/21 0544  NA 132* 135  K 3.8 3.8  CL 98 102  CO2 25 25  GLUCOSE 125* 90  BUN 14 13  CREATININE 0.62 0.50  CALCIUM 9.4 9.6    Liver Function Tests: Recent Labs  Lab 10/15/21 1924 10/16/21 0544  AST 23 23  ALT 11 11  ALKPHOS 43 38  BILITOT 0.6 0.8  PROT 6.4* 6.1*  ALBUMIN 3.5 3.5    CBG: No results for input(s): "GLUCAP" in the last 168 hours.  Microbiology Studies:  No results found for this or any previous visit (from the past 240 hour(s)).  Radiology Studies:  DG Abd Portable 1V  Result Date: 10/16/2021 CLINICAL DATA:  Large bowel obstruction. EXAM: PORTABLE ABDOMEN - 1 VIEW COMPARISON:  CT of the abdomen and pelvis of October 09, 2021 FINDINGS: Transverse colon is slightly more dilated than on the most recent comparison at 8.7 cm though is less dilated than on the study of August 07, 2021 where reach 11 cm. There is some gas and stool in the rectum. Stool is seen in the collapsed descending colon. There are dilated centrally located small bowel loops with increase, some of these bowel loops may reach 5 cm or slightly greater. IMPRESSION: Persistent colonic obstruction. Caliber at 8.7 cm while  diminished since June is slightly increased since August 18. At this caliber or above the patient may be at increased risk for perforation. Increasing small bowel distension in the central abdomen. In this patient with known colonic obstruction and peritoneal disease. Electronically Signed   By: Zetta Bills M.D.   On: 10/16/2021 08:28    Scheduled Meds:    bisoprolol  10 mg Oral Daily   And   hydrochlorothiazide  6.25 mg Oral Daily   bupivacaine liposome  20 mL Infiltration On Call to OR   lisinopril  10 mg Oral Daily   sodium chloride flush  3 mL Intravenous Q12H    Continuous Infusions:    sodium chloride 75 mL/hr at 10/16/21 0943   cefoTEtan (CEFOTAN) IV       LOS: 1 day     Vernell Leep, MD,  FACP, [facility], [facility], [facility] (Care Management Physician Certified) [place]  To contact the attending provider between 7A-7P or the covering provider during after hours 7P-7A, please log into the web site [email or web address] and access using universal [place] password for that web site. If you do not have  the password, please call the hospital operator.  10/16/2021, 1:45 PM

## 2021-10-16 NOTE — Progress Notes (Signed)
Initial Nutrition Assessment  INTERVENTION:   -If diet is advanced, recommend Ensure Plus High Protein po BID, each supplement provides 350 kcal and 20 grams of protein.   -Will monitor for plan going forward  NUTRITION DIAGNOSIS:   Increased nutrient needs related to chronic illness, post-op healing as evidenced by estimated needs.  GOAL:   Patient will meet greater than or equal to 90% of their needs  MONITOR:   Diet advancement, Labs, Weight trends, I & O's  REASON FOR ASSESSMENT:   Malnutrition Screening Tool    ASSESSMENT:   81 yo female with a history of HTN and thyroid disease who has been in her normal state of health until about 2-3 months ago when she started to develop abdominal distention with a change in her bowel habits.  Patient in room, laptop in her lap. Pt states she has been feeling sick for 10 weeks now. Prior to this she was eating well, 3 meals a day with a snack between meals. Now she is eating maybe 1/4 of a sandwich or 1/2 cup of yogurt at mealtimes. Not able to take in much, states it feels likes foods won't go down.   Pt is agreeable to protein supplements if diet is advanced.  Pt currently NPO, awaiting surgery this afternoon.   Pt reports UBW of 135 lbs. Current weight: 126 lbs.  Medications reviewed.  Labs reviewed.  NUTRITION - FOCUSED PHYSICAL EXAM:  Unable to complete, visit was shortened  Diet Order:   Diet Order             Diet NPO time specified Except for: Ice Chips, Sips with Meds  Diet effective now                   EDUCATION NEEDS:   No education needs have been identified at this time  Skin:  Skin Assessment: Reviewed RN Assessment  Last BM:  PTA  Height:   Ht Readings from Last 1 Encounters:  10/15/21 '4\' 11"'$  (1.499 m)    Weight:   Wt Readings from Last 1 Encounters:  10/15/21 57.6 kg    BMI:  25 kg/m^2  Estimated Nutritional Needs:   Kcal:  1750-1950  Protein:  75-90g  Fluid:   1.9L/day  Nancy Bibles, MS, RD, LDN Inpatient Clinical Dietitian Contact information available via Amion

## 2021-10-16 NOTE — Consult Note (Signed)
[facility] Nurse requested for preoperative stoma site marking  Discussed surgical procedure and stoma creation with patient and family.  Explained role of the [facility] nurse team.  Provided the patient with educational booklet and provided samples of pouching options.  Answered patient questions.   Examined patient lying and  sitting in order to place the marking in the patient's visual field, away from any creases or abdominal contour issues and within the rectus muscle.  Attempted to mark below the patient's belt line.   Marked for colostomy in the LUQ  __7__ cm to the left of the umbilicus and __9__IH above the umbilicus.  Marked for ileostomy in the RUQ  __5__cm to the right of the umbilicus and  __0__ cm above the umbilicus.  Patient's abdomen cleansed with CHG wipes at site markings, allowed to air dry prior to marking.Covered mark with thin film transparent dressing to preserve mark until date of surgery.   Val Riles, RN, MSN, CWOCN, CNS-BC, pager [phone]

## 2021-10-17 ENCOUNTER — Encounter (HOSPITAL_COMMUNITY): Payer: Self-pay | Admitting: Surgery

## 2021-10-17 DIAGNOSIS — K6389 Other specified diseases of intestine: Secondary | ICD-10-CM | POA: Diagnosis not present

## 2021-10-17 DIAGNOSIS — Z931 Gastrostomy status: Secondary | ICD-10-CM

## 2021-10-17 DIAGNOSIS — I10 Essential (primary) hypertension: Secondary | ICD-10-CM | POA: Diagnosis not present

## 2021-10-17 DIAGNOSIS — Z932 Ileostomy status: Secondary | ICD-10-CM

## 2021-10-17 DIAGNOSIS — K56609 Unspecified intestinal obstruction, unspecified as to partial versus complete obstruction: Secondary | ICD-10-CM

## 2021-10-17 DIAGNOSIS — C786 Secondary malignant neoplasm of retroperitoneum and peritoneum: Secondary | ICD-10-CM

## 2021-10-17 DIAGNOSIS — Z432 Encounter for attention to ileostomy: Secondary | ICD-10-CM

## 2021-10-17 LAB — CBC
HCT: 35.1 % — ABNORMAL LOW (ref 36.0–46.0)
Hemoglobin: 11.5 g/dL — ABNORMAL LOW (ref 12.0–15.0)
MCH: 34.3 pg — ABNORMAL HIGH (ref 26.0–34.0)
MCHC: 32.8 g/dL (ref 30.0–36.0)
MCV: 104.8 fL — ABNORMAL HIGH (ref 80.0–100.0)
Platelets: 313 10*3/uL (ref 150–400)
RBC: 3.35 MIL/uL — ABNORMAL LOW (ref 3.87–5.11)
RDW: 13.3 % (ref 11.5–15.5)
WBC: 10.1 10*3/uL (ref 4.0–10.5)
nRBC: 0 % (ref 0.0–0.2)

## 2021-10-17 LAB — BASIC METABOLIC PANEL WITH GFR
Anion gap: 11 (ref 5–15)
BUN: 13 mg/dL (ref 8–23)
CO2: 22 mmol/L (ref 22–32)
Calcium: 8.4 mg/dL — ABNORMAL LOW (ref 8.9–10.3)
Chloride: 103 mmol/L (ref 98–111)
Creatinine, Ser: 0.83 mg/dL (ref 0.44–1.00)
GFR, Estimated: >60 mL/min
Glucose, Bld: 92 mg/dL (ref 70–99)
Potassium: 4.1 mmol/L (ref 3.5–5.1)
Sodium: 136 mmol/L (ref 135–145)

## 2021-10-17 MED ORDER — PANTOPRAZOLE SODIUM 40 MG IV SOLR
40.0000 mg | INTRAVENOUS | Status: DC
  Administered 2021-10-17 – 2021-10-19 (×3): 40 mg via INTRAVENOUS
  Filled 2021-10-17 (×3): qty 10

## 2021-10-17 MED ORDER — ACETAMINOPHEN 10 MG/ML IV SOLN
1000.0000 mg | Freq: Four times a day (QID) | INTRAVENOUS | Status: AC
Start: 2021-10-17 — End: 2021-10-18
  Administered 2021-10-17 – 2021-10-18 (×4): 1000 mg via INTRAVENOUS
  Filled 2021-10-17 (×4): qty 100

## 2021-10-17 NOTE — Progress Notes (Signed)
PROGRESS NOTE   Nancy Harrison  XAJ:[id number]    DOB: 1940-04-30    DOA: 10/15/2021  PCP: Kristen Loader, FNP   I have briefly reviewed patients previous medical records in [facility]  Chief complaint Obstructing colonic mass   Brief Narrative:  81 year old female with remote history of breast cancer, s/p lumpectomy and chemoradiation in the 90s, HTN, HLD, GERD, goiter, seen at the cancer center on 10/15/2021, CT abdomen and pelvis from 10/09/2021 and her clinical picture were consistent with colonic obstruction secondary to malignancy and she was directed to the [facility] long ED for admission.  General surgery and medical oncology have consulted.  S/p laparoscopic loop ileostomy, G-tube and peritoneal biopsy on 8/25.   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  Patient's PCP ordered a CT which showed a right lower lobe lung nodule and dilatation of the transverse colon with a mass at the distal transverse colon consistent with malignancy, lymphadenopathy and omental caking.  She was then referred to medical oncology, seen at the cancer center on 8/24, clinical and radiological picture consistent with bowel obstruction and patient was transferred to the hospital for admission.  Medical oncology and general surgery have consulted. S/p laparoscopic loop ileostomy, G-tube and peritoneal biopsy on 8/25 by Dr. Michael Boston. Follow cytopathology.  Management per [facility] (discontinued NG tube and Foley's, continue G-tube to gravity, mobilizing, ice chips only)  Hypertension: Soft blood pressures.  Also NPO.  All home antihypertensives (thiazide, Zebeta and lisinopril) discontinued.  Monitor closely and could consider IV hydralazine as  needed if needed.  Macrocytic anemia: No significant postop drop.  Continue to follow CBCs daily.  Remote history of breast cancer: Some concern that above is recurrence of breast cancer.  Hopefully cytopathology will help.  Hyperlipidemia GERD Anxiety Initiated IV Protonix.  Body mass index is 27.67 kg/m.   DVT prophylaxis: SCD's Start: 10/16/21 1058 SCDs Start: 10/15/21 1853     Code Status: Full Code:  Family Communication: Spouse at bedside. Disposition:  Status is: Inpatient Remains inpatient appropriate because: Need for surgery as noted above     Consultants:   Medical oncology General surgery  Procedures:     Antimicrobials:      Subjective:  Complains of throat discomfort, most likely related to large bore NG tube she had this morning.  This has been discontinued since then.  Also unable to get up due to abdominal binder etc.  Objective:   Vitals:   10/16/21 2130 10/16/21 2216 10/17/21 0541 10/17/21 1420  BP: 122/68 113/64 (!) 109/59 (!) 100/50  Pulse: 92 84 88 91  Resp: '17  16 16  '$ Temp:  98.8 F (37.1 C) 98.9 F (37.2 C) 98.8 F (37.1 C)  TempSrc:  Oral Oral   SpO2: 96% 97% 97% 94%  Weight:   62.1 kg   Height:        General exam: Elderly female, small built, frail lying comfortably propped up in bed without distress.  Oral mucosa moist. Respiratory system: Clear to auscultation.  No increased work of breathing. Cardiovascular system: S1 and S2 heard, RRR.  No JVD, murmurs or pedal edema. Gastrointestinal system: Abdomen not distended, appropriate postop soreness, NG tube connected to wall suction draining brown-colored liquid this morning, had  G-tube and ileostomy in place.  Abdominal binder present.  Did not appreciate bowel sounds but difficult to hear. Central nervous system: Alert and oriented. No focal neurological deficits. Extremities: Symmetric 5 x 5 power. Skin: No rashes, lesions or ulcers Psychiatry: Judgement and insight appear  normal. Mood & affect appropriate.     Data Reviewed:   I have personally reviewed following labs and imaging studies   CBC: Recent Labs  Lab 10/15/21 1924 10/16/21 0544 10/17/21 0726  WBC 6.1 5.9 10.1  HGB 12.0 12.2 11.5*  HCT 35.4* 36.1 35.1*  MCV 103.8* 102.8* 104.8*  PLT 359 303 591    Basic Metabolic Panel: Recent Labs  Lab 10/15/21 1924 10/16/21 0544 10/17/21 0726  NA 132* 135 136  K 3.8 3.8 4.1  CL 98 102 103  CO2 '25 25 22  '$ GLUCOSE 125* 90 92  BUN '14 13 13  '$ CREATININE 0.62 0.50 0.83  CALCIUM 9.4 9.6 8.4*    Liver Function Tests: Recent Labs  Lab 10/15/21 1924 10/16/21 0544  AST 23 23  ALT 11 11  ALKPHOS 43 38  BILITOT 0.6 0.8  PROT 6.4* 6.1*  ALBUMIN 3.5 3.5    CBG: No results for input(s): "GLUCAP" in the last 168 hours.  Microbiology Studies:  No results found for this or any previous visit (from the past 240 hour(s)).  Radiology Studies:  DG Abd 1 View  Result Date: 10/16/2021 CLINICAL DATA:  Tumor in the distal transverse colon with omental metastases. Check NG tube placement. EXAM: ABDOMEN - 1 VIEW COMPARISON:  Abdomen from earlier today at 8:15 a.m., CT with IV and oral contrast 10/09/2021. FINDINGS: NGT is now in place, the tip projected at the mid body of stomach. There is a new PEG tube as well. Also newly noted is a large bore tube superimposing in the right hemiabdomen paralleling the course of the distal ascending and transverse colon with interval decompression of the previously dilated proximal transverse colon and partial decompression of the upstream central abdominal small bowel. Visceral shadows are stable.  No new abnormality is seen. IMPRESSION: 1. NGT tip projects at the mid body of stomach. 2. New PEG tube just medial to the NG tube tip. 3. Large bore tubing superimposing over or within the distal ascending and proximal transverse colon with interval decompression of the previously dilated proximal colon, and partial  decompression of the previously dilated upstream central abdominal small bowel. Electronically Signed   By: Telford Nab M.D.   On: 10/16/2021 20:57   DG Abd Portable 1V  Result Date: 10/16/2021 CLINICAL DATA:  Large bowel obstruction. EXAM: PORTABLE ABDOMEN - 1 VIEW COMPARISON:  CT of the abdomen and pelvis of October 09, 2021 FINDINGS: Transverse colon is slightly more dilated than on the most recent comparison at 8.7 cm though is less dilated than on the study of August 07, 2021 where reach 11 cm. There is some gas and stool in the rectum. Stool is seen in the collapsed descending colon. There are dilated centrally located small bowel loops with increase, some of these bowel loops may reach 5 cm or slightly greater. IMPRESSION: Persistent colonic obstruction. Caliber at 8.7 cm while diminished since June is slightly increased since August 18. At this caliber or above the patient may be at increased risk for perforation. Increasing small bowel distension in the central abdomen. In this patient with known colonic obstruction and peritoneal disease. Electronically Signed   By: Zetta Bills M.D.   On: 10/16/2021 08:28  Scheduled Meds:    lip balm   Topical BID   sodium chloride flush  3 mL Intravenous Q12H    Continuous Infusions:    sodium chloride 75 mL/hr at 10/16/21 2129   acetaminophen 1,000 mg (10/17/21 1230)   albumin human     methocarbamol (ROBAXIN) IV 500 mg (10/17/21 0732)   ondansetron (ZOFRAN) IV       LOS: 2 days     Vernell Leep, MD,  FACP, [facility], [facility], [facility] (Care Management Physician Certified) [place]  To contact the attending provider between 7A-7P or the covering provider during after hours 7P-7A, please log into the web site [email or web address] and access using universal [place] password for that web site. If you do not have the password, please call the hospital operator.  10/17/2021, 4:42 PM

## 2021-10-17 NOTE — Progress Notes (Signed)
1 Day Post-Op   Subjective/Chief Complaint: Sore, otherwise well   Objective: Vital signs in last 24 hours: Temp:  [97.4 F (36.3 C)-98.9 F (37.2 C)] 98.9 F (37.2 C) (08/26 0541) Pulse Rate:  [81-92] 88 (08/26 0541) Resp:  [12-19] 16 (08/26 0541) BP: (109-126)/(58-83) 109/59 (08/26 0541) SpO2:  [88 %-100 %] 97 % (08/26 0541) Weight:  [57 kg-62.1 kg] 62.1 kg (08/26 0541) Last BM Date : 10/16/21  Intake/Output from previous day: 08/25 0701 - 08/26 0700 In: 2675.8 [I.V.:1595.8; NG/GT:120; IV Piggyback:200] Out: 1100 [Urine:375; Emesis/NG output:250; Stool:400; Blood:75] Intake/Output this shift: No intake/output data recorded.  Ab soft approp tender g tube draining ileostomy in place  Lab Results:  Recent Labs    10/16/21 0544 10/17/21 0726  WBC 5.9 10.1  HGB 12.2 11.5*  HCT 36.1 35.1*  PLT 303 313   BMET Recent Labs    10/16/21 0544 10/17/21 0726  NA 135 136  K 3.8 4.1  CL 102 103  CO2 25 22  GLUCOSE 90 92  BUN 13 13  CREATININE 0.50 0.83  CALCIUM 9.6 8.4*   PT/INR No results for input(s): "LABPROT", "INR" in the last 72 hours. ABG No results for input(s): "PHART", "HCO3" in the last 72 hours.  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 Check NG tube placement. EXAM: ABDOMEN - 1 VIEW COMPARISON:  Abdomen from earlier today at 8:15 a.m., CT with IV and oral contrast 10/09/2021. FINDINGS: NGT is now in place, the tip projected at the mid body of stomach. There is a new PEG tube as well. Also newly noted is a large bore tube superimposing in the right hemiabdomen paralleling the course of the distal ascending and transverse colon with interval decompression of the previously dilated proximal transverse colon and partial decompression of the upstream central abdominal small bowel. Visceral shadows are stable.  No new abnormality is seen.  IMPRESSION: 1. NGT tip projects at the mid body of stomach. 2. New PEG tube just medial to the NG tube tip. 3. Large bore tubing superimposing over or within the distal ascending and proximal transverse colon with interval decompression of the previously dilated proximal colon, and partial decompression of the previously dilated upstream central abdominal small bowel. Electronically Signed   By: Telford Nab M.D.   On: 10/16/2021 20:57   DG Abd Portable 1V  Result Date: 10/16/2021 CLINICAL DATA:  Large bowel obstruction. EXAM: PORTABLE ABDOMEN - 1 VIEW COMPARISON:  CT of the abdomen and pelvis of October 09, 2021 FINDINGS: Transverse colon is slightly more dilated than on the most recent comparison at 8.7 cm though is less dilated than on the study of August 07, 2021 where reach 11 cm. There is some gas and stool in the rectum. Stool is seen in the collapsed descending colon. There are dilated centrally located small bowel loops with increase, some of these bowel loops may reach 5 cm or slightly greater. IMPRESSION: Persistent colonic obstruction. Caliber at 8.7 cm while diminished since June is slightly increased since August 18. At this caliber or above the patient may be at increased risk for perforation. Increasing small bowel distension in the central abdomen. In this patient with known colonic obstruction and peritoneal disease. Electronically Signed   By: Zetta Bills M.D.   On: 10/16/2021 08:28    Anti-infectives: Anti-infectives (From admission, onward)    Start  Dose/Rate Route Frequency Ordered Stop   10/17/21 0600  cefoTEtan (CEFOTAN) 2 g in sodium chloride 0.9 % 100 mL IVPB        2 g 200 mL/hr over 30 Minutes Intravenous Every 12 hours 10/16/21 2159 10/17/21 0646   10/16/21 1600  cefoTEtan (CEFOTAN) 2 g in sodium chloride 0.9 % 100 mL IVPB        2 g 200 mL/hr over 30 Minutes Intravenous On call to O.R. 10/16/21 1059 10/16/21 1820       Assessment/Plan: POD 1 loop ileostomy, g  tube, peritoneal biopsy -will dc ng and leave g tube to gravity today -dc foley -ice chips only -oob, pulm toilet -await pathology  Rolm Bookbinder 10/17/2021

## 2021-10-17 NOTE — Consult Note (Signed)
[place] Nurse ostomy consult note: POD 1 Consult received for new diverting loop ileostomy. [place] Nurse will see on Monday, 11/19/21.  [place] nursing team will follow along with you, and will remain available to this patient, the nursing, surgical, and medical teams.    Thank you for inviting [place] to participate in this patient's Plan of Care.  Maudie Flakes, MSN, RN, CNS, [place], Serita Grammes, [employer], [employer] phone:  [phone]

## 2021-10-18 DIAGNOSIS — I10 Essential (primary) hypertension: Secondary | ICD-10-CM | POA: Diagnosis not present

## 2021-10-18 DIAGNOSIS — K6389 Other specified diseases of intestine: Secondary | ICD-10-CM | POA: Diagnosis not present

## 2021-10-18 LAB — BASIC METABOLIC PANEL
Anion gap: 9 (ref 5–15)
BUN: 13 mg/dL (ref 8–23)
CO2: 22 mmol/L (ref 22–32)
Calcium: 8.3 mg/dL — ABNORMAL LOW (ref 8.9–10.3)
Chloride: 106 mmol/L (ref 98–111)
Creatinine, Ser: 0.59 mg/dL (ref 0.44–1.00)
GFR, Estimated: >60 mL/min (ref >60)
Glucose, Bld: 88 mg/dL (ref 70–99)
Potassium: 3.2 mmol/L — ABNORMAL LOW (ref 3.5–5.1)
Sodium: 137 mmol/L (ref 135–145)

## 2021-10-18 LAB — CBC
HCT: 32.9 % — ABNORMAL LOW (ref 36.0–46.0)
Hemoglobin: 10.7 g/dL — ABNORMAL LOW (ref 12.0–15.0)
MCH: 34.3 pg — ABNORMAL HIGH (ref 26.0–34.0)
MCHC: 32.5 g/dL (ref 30.0–36.0)
MCV: 105.4 fL — ABNORMAL HIGH (ref 80.0–100.0)
Platelets: 278 10*3/uL (ref 150–400)
RBC: 3.12 MIL/uL — ABNORMAL LOW (ref 3.87–5.11)
RDW: 13.6 % (ref 11.5–15.5)
WBC: 7.5 10*3/uL (ref 4.0–10.5)
nRBC: 0 % (ref 0.0–0.2)

## 2021-10-18 MED ORDER — OXYCODONE HCL 5 MG PO TABS: 5.0000 mg | ORAL_TABLET | ORAL | Status: DC | PRN

## 2021-10-18 MED ORDER — POTASSIUM CHLORIDE 20 MEQ PO PACK
40.0000 meq | PACK | Freq: Once | ORAL | Status: AC
  Administered 2021-10-18: 40 meq via ORAL
  Filled 2021-10-18: qty 2

## 2021-10-18 MED ORDER — ACETAMINOPHEN 325 MG PO TABS
650.0000 mg | ORAL_TABLET | Freq: Four times a day (QID) | ORAL | Status: AC
  Administered 2021-10-18 – 2021-10-19 (×5): 650 mg via ORAL
  Filled 2021-10-18 (×6): qty 2

## 2021-10-18 MED ORDER — HYDROMORPHONE HCL 1 MG/ML IJ SOLN: 0.5000 mg | INTRAMUSCULAR | Status: DC | PRN

## 2021-10-18 NOTE — Progress Notes (Signed)
PROGRESS NOTE   Nancy Harrison  WLN:[id number]    DOB: Nov 11, 1940    DOA: 10/15/2021  PCP: Kristen Loader, FNP   I have briefly reviewed patients previous medical records in [facility].  Chief complaint Obstructing colonic mass   Brief Narrative:  81 year old female with remote history of breast cancer, s/p lumpectomy and chemoradiation in the 90s, HTN, HLD, GERD, goiter, seen at the cancer center on 10/15/2021, CT abdomen and pelvis from 10/09/2021 and her clinical picture were consistent with colonic obstruction secondary to malignancy and she was directed to the [facility] long ED for admission.  General surgery and medical oncology have consulted.  S/p laparoscopic loop ileostomy, G-tube and peritoneal biopsy on 8/25.   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  Patient's PCP ordered a CT which showed a right lower lobe lung nodule and dilatation of the transverse colon with a mass at the distal transverse colon consistent with malignancy, lymphadenopathy and omental caking.  She was then referred to medical oncology, seen at the cancer center on 8/24, clinical and radiological picture consistent with bowel obstruction and patient was transferred to the hospital for admission.  Medical oncology and general surgery have consulted. S/p laparoscopic loop ileostomy, G-tube and peritoneal biopsy on 8/25 by Dr. Michael Boston. Follow cytopathology.  CCS continue to manage surgical issues.  NGT, Foley out.  G-tube clamped.  Tolerating clears thus far.  Hypokalemia: Replace and  follow.  Follow magnesium.  Hypertension: Blood pressures improved and currently controlled off of meds.  All home antihypertensives (thiazide, Zebeta and lisinopril) currently on hold.  Monitor closely and consider resuming antihypertensive stepwise as BP allows.  Macrocytic anemia: Hemoglobin has dropped from 12 g range to 10.7, likely expected related to surgery.  Follow CBC.  Remote history of breast cancer: Some concern that above is recurrence of breast cancer.  Hopefully cytopathology will help.  Hyperlipidemia GERD Anxiety Initiated IV Protonix.  Body mass index is 27.67 kg/m.   DVT prophylaxis: SCDs Start: 10/15/21 1853     Code Status: Full Code:  Family Communication: None at bedside. Disposition:  Status is: Inpatient Remains inpatient appropriate because: Postop course, advancing diet.  Hopefully should be medically ready for discharge in 48 hours, pending surgical clearance.     Consultants:   Medical oncology General surgery  Procedures:     Antimicrobials:      Subjective:  Feels better now that NGT is out since yesterday.  Ambulated some.  Tolerating clears/Jell-O since this morning.  No abdominal pain.  Cannot tell if she has flatus in her ileostomy.  No nausea or vomiting.  Objective:   Vitals:   10/17/21 0541 10/17/21 1420 10/17/21 2106 10/18/21 0438  BP: (!) 109/59 (!) 100/50 116/65 116/70  Pulse: 88 91 91 86  Resp: '16 16 18 18  '$ Temp: 98.9 F (37.2 C) 98.8 F (37.1 C) (!) 97.4 F (36.3 C) 97.7 F (36.5 C)  TempSrc: Oral  Oral Oral  SpO2: 97% 94% 94% 95%  Weight: 62.1 kg     Height:  General exam: Elderly female, small built, frail lying comfortably propped up in bed without distress.  Oral mucosa moist. Respiratory system: Clear to auscultation.  No increased work of breathing. Cardiovascular system: S1 and S2 heard, RRR.  No JVD, murmurs or pedal edema. Gastrointestinal system: Abdomen not distended, appropriate postop  soreness, NGT out NG tube clamped.  Ileostomy +.  Abdominal binder present.  Did not appreciate bowel sounds but difficult to hear. Central nervous system: Alert and oriented. No focal neurological deficits. Extremities: Symmetric 5 x 5 power. Skin: No rashes, lesions or ulcers Psychiatry: Judgement and insight appear normal. Mood & affect appropriate.     Data Reviewed:   I have personally reviewed following labs and imaging studies   CBC: Recent Labs  Lab 10/16/21 0544 10/17/21 0726 10/18/21 0622  WBC 5.9 10.1 7.5  HGB 12.2 11.5* 10.7*  HCT 36.1 35.1* 32.9*  MCV 102.8* 104.8* 105.4*  PLT 303 313 093    Basic Metabolic Panel: Recent Labs  Lab 10/15/21 1924 10/16/21 0544 10/17/21 0726 10/18/21 0622  NA 132* 135 136 137  K 3.8 3.8 4.1 3.2*  CL 98 102 103 106  CO2 '25 25 22 22  '$ GLUCOSE 125* 90 92 88  BUN '14 13 13 13  '$ CREATININE 0.62 0.50 0.83 0.59  CALCIUM 9.4 9.6 8.4* 8.3*    Liver Function Tests: Recent Labs  Lab 10/15/21 1924 10/16/21 0544  AST 23 23  ALT 11 11  ALKPHOS 43 38  BILITOT 0.6 0.8  PROT 6.4* 6.1*  ALBUMIN 3.5 3.5    CBG: No results for input(s): "GLUCAP" in the last 168 hours.  Microbiology Studies:  No results found for this or any previous visit (from the past 240 hour(s)).  Radiology Studies:  DG Abd 1 View  Result Date: 10/16/2021 CLINICAL DATA:  Tumor in the distal transverse colon with omental metastases. Check NG tube placement. EXAM: ABDOMEN - 1 VIEW COMPARISON:  Abdomen from earlier today at 8:15 a.m., CT with IV and oral contrast 10/09/2021. FINDINGS: NGT is now in place, the tip projected at the mid body of stomach. There is a new PEG tube as well. Also newly noted is a large bore tube superimposing in the right hemiabdomen paralleling the course of the distal ascending and transverse colon with interval decompression of the previously dilated proximal transverse colon and partial decompression of the upstream central abdominal  small bowel. Visceral shadows are stable.  No new abnormality is seen. IMPRESSION: 1. NGT tip projects at the mid body of stomach. 2. New PEG tube just medial to the NG tube tip. 3. Large bore tubing superimposing over or within the distal ascending and proximal transverse colon with interval decompression of the previously dilated proximal colon, and partial decompression of the previously dilated upstream central abdominal small bowel. Electronically Signed   By: Telford Nab M.D.   On: 10/16/2021 20:57    Scheduled Meds:    acetaminophen  650 mg Oral Q6H   lip balm   Topical BID   pantoprazole (PROTONIX) IV  40 mg Intravenous Q24H   potassium chloride  40 mEq Oral Once   sodium chloride flush  3 mL Intravenous Q12H    Continuous Infusions:    sodium chloride 75 mL/hr at 10/16/21 2129   methocarbamol (ROBAXIN) IV 500 mg (10/18/21 0539)   ondansetron (ZOFRAN) IV       LOS: 3 days     Vernell Leep, MD,  FACP, [facility], [facility], [facility] (Care Management Physician  Certified) [facility] [place]  To contact the attending provider between 7A-7P or the covering provider during after hours 7P-7A, please log into the web site [email or web address] and access using universal [place] password for that web site. If you do not have the password, please call the hospital operator.  10/18/2021, 10:58 AM

## 2021-10-18 NOTE — Anesthesia Postprocedure Evaluation (Signed)
Anesthesia Post Note  Patient: Nancy Harrison  Procedure(s) Performed: LAPAROSCOPIC  DIVERTING OSTOMY, BIOSPY,  G-TUBE PLACEMENT (Abdomen)     Patient location during evaluation: PACU Anesthesia Type: General Level of consciousness: awake and alert Pain management: pain level controlled Vital Signs Assessment: post-procedure vital signs reviewed and stable Respiratory status: spontaneous breathing, nonlabored ventilation, respiratory function stable and patient connected to nasal cannula oxygen Cardiovascular status: blood pressure returned to baseline and stable Postop Assessment: no apparent nausea or vomiting Anesthetic complications: no   No notable events documented.  Last Vitals:  Vitals:   10/17/21 2106 10/18/21 0438  BP: 116/65 116/70  Pulse: 91 86  Resp: 18 18  Temp: (!) 36.3 C 36.5 C  SpO2: 94% 95%    Last Pain:  Vitals:   10/18/21 0438  TempSrc: Oral  PainSc:                  Tiajuana Amass

## 2021-10-18 NOTE — Progress Notes (Signed)
2 Days Post-Op   Subjective/Chief Complaint: Ambulated, voiding, no n/v, doing well this am   Objective: Vital signs in last 24 hours: Temp:  [97.4 F (36.3 C)-98.8 F (37.1 C)] 97.7 F (36.5 C) (08/27 0438) Pulse Rate:  [86-91] 86 (08/27 0438) Resp:  [16-18] 18 (08/27 0438) BP: (100-116)/(50-70) 116/70 (08/27 0438) SpO2:  [94 %-95 %] 95 % (08/27 0438) Last BM Date : 10/17/21 (small amount in ileostomy bag)  Intake/Output from previous day: 08/26 0701 - 08/27 0700 In: -  Out: 550 [Urine:200; Drains:300; Stool:50] Intake/Output this shift: No intake/output data recorded.  Ab soft approp tender ileostomy with some output red rubber in distal limb, g tube with some drainage but not a lot, dressings dry  Lab Results:  Recent Labs    10/17/21 0726 10/18/21 0622  WBC 10.1 7.5  HGB 11.5* 10.7*  HCT 35.1* 32.9*  PLT 313 278   BMET Recent Labs    10/17/21 0726 10/18/21 0622  NA 136 137  K 4.1 3.2*  CL 103 106  CO2 22 22  GLUCOSE 92 88  BUN 13 13  CREATININE 0.83 0.59  CALCIUM 8.4* 8.3*   PT/INR No results for input(s): "LABPROT", "INR" in the last 72 hours. ABG No results for input(s): "PHART", "HCO3" in the last 72 hours.  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 Check NG tube placement. EXAM: ABDOMEN - 1 VIEW COMPARISON:  Abdomen from earlier today at 8:15 a.m., CT with IV and oral contrast 10/09/2021. FINDINGS: NGT is now in place, the tip projected at the mid body of stomach. There is a new PEG tube as well. Also newly noted is a large bore tube superimposing in the right hemiabdomen paralleling the course of the distal ascending and transverse colon with interval decompression of the previously dilated proximal transverse colon and partial decompression of the upstream central abdominal small bowel. Visceral shadows are stable.  No new  abnormality is seen. IMPRESSION: 1. NGT tip projects at the mid body of stomach. 2. New PEG tube just medial to the NG tube tip. 3. Large bore tubing superimposing over or within the distal ascending and proximal transverse colon with interval decompression of the previously dilated proximal colon, and partial decompression of the previously dilated upstream central abdominal small bowel. Electronically Signed   By: Telford Nab M.D.   On: 10/16/2021 20:57   DG Abd Portable 1V  Result Date: 10/16/2021 CLINICAL DATA:  Large bowel obstruction. EXAM: PORTABLE ABDOMEN - 1 VIEW COMPARISON:  CT of the abdomen and pelvis of October 09, 2021 FINDINGS: Transverse colon is slightly more dilated than on the most recent comparison at 8.7 cm though is less dilated than on the study of August 07, 2021 where reach 11 cm. There is some gas and stool in the rectum. Stool is seen in the collapsed descending colon. There are dilated centrally located small bowel loops with increase, some of these bowel loops may reach 5 cm or slightly greater. IMPRESSION: Persistent colonic obstruction. Caliber at 8.7 cm while diminished since June is slightly increased since August 18. At this caliber or above the patient may be at increased risk for perforation. Increasing small bowel distension in the central abdomen. In this patient with known colonic obstruction and peritoneal disease. Electronically Signed   By: Zetta Bills M.D.   On: 10/16/2021 08:28    Anti-infectives: Anti-infectives (  From admission, onward)    Start     Dose/Rate Route Frequency Ordered Stop   10/17/21 0600  cefoTEtan (CEFOTAN) 2 g in sodium chloride 0.9 % 100 mL IVPB        2 g 200 mL/hr over 30 Minutes Intravenous Every 12 hours 10/16/21 2159 10/17/21 0646   10/16/21 1600  cefoTEtan (CEFOTAN) 2 g in sodium chloride 0.9 % 100 mL IVPB        2 g 200 mL/hr over 30 Minutes Intravenous On call to O.R. 10/16/21 1059 10/16/21 1820       Assessment/Plan: POD  2 loop ileostomy, g tube, peritoneal biopsy-SG -some output so will clamp g tube and let her have clears today -oob, pulm toilet -await pathology -replace potassium -lovenox, scds -red rubber in distal limb can get removed tomorrow- woc consult written for tomorrow  Rolm Bookbinder 10/18/2021

## 2021-10-19 DIAGNOSIS — I10 Essential (primary) hypertension: Secondary | ICD-10-CM | POA: Diagnosis not present

## 2021-10-19 DIAGNOSIS — K6389 Other specified diseases of intestine: Secondary | ICD-10-CM | POA: Diagnosis not present

## 2021-10-19 LAB — BASIC METABOLIC PANEL
Anion gap: 5 (ref 5–15)
BUN: 9 mg/dL (ref 8–23)
CO2: 21 mmol/L — ABNORMAL LOW (ref 22–32)
Calcium: 8.2 mg/dL — ABNORMAL LOW (ref 8.9–10.3)
Chloride: 110 mmol/L (ref 98–111)
Creatinine, Ser: 0.41 mg/dL — ABNORMAL LOW (ref 0.44–1.00)
GFR, Estimated: >60 mL/min (ref >60)
Glucose, Bld: 89 mg/dL (ref 70–99)
Potassium: 3.8 mmol/L (ref 3.5–5.1)
Sodium: 136 mmol/L (ref 135–145)

## 2021-10-19 LAB — MAGNESIUM: Magnesium: 1.6 mg/dL — ABNORMAL LOW (ref 1.7–2.4)

## 2021-10-19 LAB — CBC
HCT: 34.6 % — ABNORMAL LOW (ref 36.0–46.0)
Hemoglobin: 11.3 g/dL — ABNORMAL LOW (ref 12.0–15.0)
MCH: 34.3 pg — ABNORMAL HIGH (ref 26.0–34.0)
MCHC: 32.7 g/dL (ref 30.0–36.0)
MCV: 105.2 fL — ABNORMAL HIGH (ref 80.0–100.0)
Platelets: 311 10*3/uL (ref 150–400)
RBC: 3.29 MIL/uL — ABNORMAL LOW (ref 3.87–5.11)
RDW: 13.5 % (ref 11.5–15.5)
WBC: 7.9 10*3/uL (ref 4.0–10.5)
nRBC: 0 % (ref 0.0–0.2)

## 2021-10-19 MED ORDER — BOOST / RESOURCE BREEZE PO LIQD CUSTOM
1.0000 | Freq: Three times a day (TID) | ORAL | Status: DC
  Administered 2021-10-19 – 2021-10-21 (×6): 1 via ORAL

## 2021-10-19 MED ORDER — MAGNESIUM SULFATE 2 GM/50ML IV SOLN
2.0000 g | Freq: Once | INTRAVENOUS | Status: AC
  Administered 2021-10-19: 2 g via INTRAVENOUS
  Filled 2021-10-19: qty 50

## 2021-10-19 MED ORDER — BISOPROLOL FUMARATE 5 MG PO TABS
10.0000 mg | ORAL_TABLET | Freq: Every day | ORAL | Status: DC
Start: 2021-10-19 — End: 2021-10-22
  Administered 2021-10-19 – 2021-10-22 (×4): 10 mg via ORAL
  Filled 2021-10-19 (×4): qty 2

## 2021-10-19 MED ORDER — METHOCARBAMOL 500 MG PO TABS
500.0000 mg | ORAL_TABLET | Freq: Three times a day (TID) | ORAL | Status: DC
Start: 2021-10-19 — End: 2021-10-22
  Administered 2021-10-19 – 2021-10-22 (×9): 500 mg via ORAL
  Filled 2021-10-19 (×9): qty 1

## 2021-10-19 MED ORDER — OXYCODONE HCL 5 MG PO TABS
5.0000 mg | ORAL_TABLET | ORAL | Status: DC | PRN
  Administered 2021-10-19: 5 mg via ORAL
  Filled 2021-10-19: qty 1

## 2021-10-19 NOTE — Evaluation (Signed)
Physical Therapy Evaluation Patient Details Name: Nancy Harrison MRN: [id number] DOB: December 19, 1940 Today's Date: 10/19/2021  History of Present Illness  81 year old female with remote history of breast cancer, s/p lumpectomy and chemoradiation in the 90s, HTN, HLD, GERD, goiter, seen at the cancer center on 10/15/2021, CT abdomen and pelvis from 10/09/2021 and her clinical picture were consistent with colonic obstruction secondary to malignancy and she was directed to the [facility] long ED for admission. Pt is now s/p laparoscopic loop ileostomy, G-tube and peritoneal biopsy on 8/25.    Clinical Impression  Nancy Harrison is 81 y.o. female admitted with above HPI and diagnosis. Patient is currently limited by functional impairments below (see PT problem list). Patient lives with her husband and is independent at baseline. Currently she requires min guard for gait and transfers with RW and min assist to complete bed mobility due to abdominal discomfort. Patient will benefit from continued skilled PT interventions to address impairments and progress independence with mobility. Anticipate pt will progress well in acute setting with no follow up PT needs. Acute PT will follow and progress as able.      Recommendations for follow up therapy are one component of a multi-disciplinary discharge planning process, led by the attending physician.  Recommendations may be updated based on patient status, additional functional criteria and insurance authorization.  Follow Up Recommendations No PT follow up      Assistance Recommended at Discharge Intermittent Supervision/Assistance  Patient can return home with the following  A little help with walking and/or transfers;A little help with bathing/dressing/bathroom;Assist for transportation;Help with stairs or ramp for entrance;Assistance with cooking/housework    Equipment Recommendations Rolling walker (2 wheels) (youth)  Recommendations for Other  Services       Functional Status Assessment Patient has had a recent decline in their functional status and demonstrates the ability to make significant improvements in function in a reasonable and predictable amount of time.     Precautions / Restrictions Precautions Precautions: Fall Precaution Comments: Rt ostomy, ~1 fall in last 6 months Restrictions Weight Bearing Restrictions: No      Mobility  Bed Mobility Overal bed mobility: Needs Assistance Bed Mobility: Supine to Sit, Sit to Supine     Supine to sit: Min assist, HOB elevated Sit to supine: Min assist   General bed mobility comments: cues for rolling slightly to side and use of grab bar to rais\e trunk. Pt able to bring LE's off EOB without assit. Min assist to raise LE's into bed at EOS.    Transfers Overall transfer level: Needs assistance Equipment used: Rolling walker (2 wheels) Transfers: Sit to/from Stand Sit to Stand: Min guard           General transfer comment: guarding for safety with rise to RW, no assist or cues needed. pt taking extra time to control descent to sit EOB after ambulation.    Ambulation/Gait Ambulation/Gait assistance: Min guard, Supervision Gait Distance (Feet): 300 Feet Assistive device: Rolling walker (2 wheels) Gait Pattern/deviations: Step-through pattern, Decreased stride length Gait velocity: decr     General Gait Details: guarding for safety at start and supervision as gait progressed. Pt steady with no LOB throughout and maintained safe proximity to RW.  Stairs            Wheelchair Mobility    Modified Rankin (Stroke Patients Only)       Balance Overall balance assessment: Mild deficits observed, not formally tested  Pertinent Vitals/Pain Pain Assessment Pain Assessment: Faces Faces Pain Scale: Hurts little more Pain Location: abdomen Pain Descriptors / Indicators: Discomfort, Sore,  Guarding, Operative site guarding Pain Intervention(s): Limited activity within patient's tolerance, Monitored during session, Repositioned    Home Living Family/patient expects to be discharged to:: Private residence Living Arrangements: Spouse/significant other Available Help at Discharge: Family Type of Home: House Home Access: Stairs to enter Entrance Stairs-Rails: Psychiatric nurse of Steps: 6   Home Layout: One level;Laundry or work area in [place]: Grab bars - tub/shower Additional Comments: one of her daughters lives close by    Prior Function Prior Level of Function : Independent/Modified Independent                     Hand Dominance   Dominant Hand: Right    Extremity/Trunk Assessment   Upper Extremity Assessment Upper Extremity Assessment: Overall WFL for tasks assessed    Lower Extremity Assessment Lower Extremity Assessment: Overall WFL for tasks assessed    Cervical / Trunk Assessment Cervical / Trunk Assessment: Normal  Communication   Communication: No difficulties  Cognition Arousal/Alertness: Awake/alert Behavior During Therapy: WFL for tasks assessed/performed Overall Cognitive Status: Within Functional Limits for tasks assessed                                          General Comments      Exercises     Assessment/Plan    PT Assessment Patient needs continued PT services  PT Problem List Decreased activity tolerance;Decreased balance;Decreased mobility;Decreased knowledge of use of DME;Decreased knowledge of precautions;Pain       PT Treatment Interventions DME instruction;Gait training;Stair training;Functional mobility training;Therapeutic activities;Therapeutic exercise;Balance training;Patient/family education    PT Goals (Current goals can be found in the Care Plan section)  Acute Rehab PT Goals Patient Stated Goal: get home and recover PT Goal Formulation: With  patient Time For Goal Achievement: 11/02/21 Potential to Achieve Goals: Good    Frequency Min 3X/week     Co-evaluation               AM-PAC PT "6 Clicks" Mobility  Outcome Measure Help needed turning from your back to your side while in a flat bed without using bedrails?: A Little Help needed moving from lying on your back to sitting on the side of a flat bed without using bedrails?: A Little Help needed moving to and from a bed to a chair (including a wheelchair)?: A Little Help needed standing up from a chair using your arms (e.g., wheelchair or bedside chair)?: A Little Help needed to walk in hospital room?: A Little Help needed climbing 3-5 steps with a railing? : A Little 6 Click Score: 18    End of Session Equipment Utilized During Treatment: Gait belt (abdominal binder) Activity Tolerance: Patient tolerated treatment well Patient left: in bed;with call bell/phone within reach;with bed alarm set Nurse Communication: Mobility status PT Visit Diagnosis: Unsteadiness on feet (R26.81);Difficulty in walking, not elsewhere classified (R26.2);Muscle weakness (generalized) (M62.81)    Time: 4132-4401 PT Time Calculation (min) (ACUTE ONLY): 26 min   Charges:   PT Evaluation $PT Eval Low Complexity: 1 Low PT Treatments $Gait Training: 8-22 mins        Verner Mould, DPT Acute Rehabilitation Services Office [phone] Pager [phone]  10/19/21 2:21 PM

## 2021-10-19 NOTE — Progress Notes (Signed)
[facility] Progress Note  3 Days Post-Op  Subjective: CC-  Tolerating clear liquids. Denies n/v. Did not have to vent G tube. Ostomy productive. Does not feels like she's moving around very well. Has a lot of abdominal pain with movement, only taking the scheduled tylenol and robaxin for pain.  Objective: Vital signs in last 24 hours: Temp:  [98.1 F (36.7 C)-98.7 F (37.1 C)] 98.1 F (36.7 C) (08/28 0536) Pulse Rate:  [51-98] 91 (08/28 0536) Resp:  [14-21] 16 (08/28 0536) BP: (133-144)/(62-84) 144/84 (08/28 0536) SpO2:  [91 %-95 %] 95 % (08/28 0536) Weight:  [62.3 kg] 62.3 kg (08/28 0536) Last BM Date : 10/17/21 (small amount in ileostomy bag)  Intake/Output from previous day: 08/27 0701 - 08/28 0700 In: 1704.2 [I.V.:1704.2] Out: 350 [Stool:350] Intake/Output this shift: No intake/output data recorded.  PE: Gen:  Alert, NAD, pleasant Pulm:  rate and effort normal on room air Abd: Soft, minimal distension, appropriately tender, lap incisions cdi, ileostomy viable with red rubber in place and air and liquid brown stool in pouch. G tube clamped  Lab Results:  Recent Labs    10/18/21 0622 10/19/21 0525  WBC 7.5 7.9  HGB 10.7* 11.3*  HCT 32.9* 34.6*  PLT 278 311   BMET Recent Labs    10/18/21 0622 10/19/21 0525  NA 137 136  K 3.2* 3.8  CL 106 110  CO2 22 21*  GLUCOSE 88 89  BUN 13 9  CREATININE 0.59 0.41*  CALCIUM 8.3* 8.2*   PT/INR No results for input(s): "LABPROT", "INR" in the last 72 hours. CMP     Component Value Date/Time   NA 136 10/19/2021 0525   K 3.8 10/19/2021 0525   CL 110 10/19/2021 0525   CO2 21 (L) 10/19/2021 0525   GLUCOSE 89 10/19/2021 0525   BUN 9 10/19/2021 0525   CREATININE 0.41 (L) 10/19/2021 0525   CALCIUM 8.2 (L) 10/19/2021 0525   PROT 6.1 (L) 10/16/2021 0544   ALBUMIN 3.5 10/16/2021 0544   AST 23 10/16/2021 0544   ALT 11 10/16/2021 0544   ALKPHOS 38 10/16/2021 0544   BILITOT 0.8 10/16/2021 0544   GFRNONAA  >60 10/19/2021 0525   Lipase  No results found for: "LIPASE"     Studies/Results: No results found.  Anti-infectives: Anti-infectives (From admission, onward)    Start     Dose/Rate Route Frequency Ordered Stop   10/17/21 0600  cefoTEtan (CEFOTAN) 2 g in sodium chloride 0.9 % 100 mL IVPB        2 g 200 mL/hr over 30 Minutes Intravenous Every 12 hours 10/16/21 2159 10/17/21 0646   10/16/21 1600  cefoTEtan (CEFOTAN) 2 g in sodium chloride 0.9 % 100 mL IVPB        2 g 200 mL/hr over 30 Minutes Intravenous On call to O.R. 10/16/21 1059 10/16/21 1820        Assessment/Plan POD #3 loop ileostomy, g tube, peritoneal biopsy-SG 8/25 -advance to FLD, add Boost. Keep G tube clamped unless she becomes nauseated -will ask PT to see -await pathology, CEA pending -WOC to see today, d/c red rubber in distal limb today -continue scheduled tylenol and robaxin, encouraged patient to take oxycodone prior to mobilizing to help with pain control  ID - cefotetan periop FEN - IVF, FLD, Boost VTE - per primary/ ok for chemical dvt ppx from surgical standpoint Foley - none  HTN HLD GERD Anxiety Remote h/o breast cancer    LOS: 4 days  Wellington Hampshire, [facility] 10/19/2021, 8:10 AM Please see Amion for pager number during day hours 7:00am-4:30pm

## 2021-10-19 NOTE — Care Management Important Message (Signed)
Important Message  Patient Details IM Letter given to the Patient. Name: Nancy Harrison MRN: [id number] Date of Birth: 23-Aug-1940   Medicare Important Message Given:  Yes     Kerin Salen 10/19/2021, 9:23 AM

## 2021-10-19 NOTE — Consult Note (Signed)
[place] Nurse ostomy follow up Patient receiving care in [place] 1619. Spouse present for ostomy teaching session. Stoma type/location: RUQ look ileostomy Stomal assessment/size: 1.5 inches, budded, sutures intact, red, moist Peristomal assessment: intact Treatment options for stomal/peristomal skin: barrier ring Output: small amount of thin brown effluent Ostomy pouching: 2pc. 2 and 3/4 inch system. Patient uses Pouch Kellie Simmering [phone]; skin barrier Kellie Simmering #2; barrier ring Kellie Simmering [phone] Education provided: Entire pouching system removal, cleaning, new system preparation and application processes. Both the patient and her spouse participated and performed hands-on processes during the teaching. Enrolled patient in [place] Start Discharge program: No -- spouse wishes to check with his CPAP supply provider before deciding about enrolling in Secure Start through [place]. The spouse told me today both of them had read the education materials in the education folder before I arrived today.  I have placed the patient on the [place] follow up list for one of [place] to see them again on Wednesday of this week.  Val Riles, RN, MSN, CWOCN, CNS-BC, pager [phone]

## 2021-10-19 NOTE — Consult Note (Signed)
[place] Nurse ostomy follow up Patient receiving care in [place] 1619. I left the [employer] education folder with the patient, viewed her stoma through the existing pouch, and asked the [place] to order 5 of the following: Pouch [place] #649; skin barrier Kellie Simmering #2, barrier rings Kellie Simmering [phone].   The patient wishes for her husband to receive ostomy training. My plans are to return today around 11:30 to provide ostomy care, remove the support rod, and perform the first ostomy teaching session. Val Riles, RN, MSN, CWOCN, CNS-BC, pager [phone]

## 2021-10-19 NOTE — Progress Notes (Signed)
PROGRESS NOTE   Nancy Harrison  CXK:[id number]    DOB: 1940-12-02    DOA: 10/15/2021  PCP: Kristen Loader, FNP   I have briefly reviewed patients previous medical records in [facility].  Chief complaint Obstructing colonic mass   Brief Narrative:  81 year old female with remote history of breast cancer, s/p lumpectomy and chemoradiation in the 90s, HTN, HLD, GERD, goiter, seen at the cancer center on 10/15/2021, CT abdomen and pelvis from 10/09/2021 and her clinical picture were consistent with colonic obstruction secondary to malignancy and she was directed to the [facility] long ED for admission.  General surgery and medical oncology have consulted.  S/p laparoscopic loop ileostomy, G-tube and peritoneal biopsy on 8/25.   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  Patient's PCP ordered a CT which showed a right lower lobe lung nodule and dilatation of the transverse colon with a mass at the distal transverse colon consistent with malignancy, lymphadenopathy and omental caking.  She was then referred to medical oncology, seen at the cancer center on 8/24, clinical and radiological picture consistent with bowel obstruction and patient was transferred to the hospital for admission.  Medical oncology and general surgery have consulted. S/p laparoscopic loop ileostomy, G-tube and peritoneal biopsy on 8/25 by Dr. Michael Boston. Follow cytopathology.  CCS continue to manage surgical issues.  NGT, Foley out.  G-tube clamped, advancing diet-up to full liquids today.  Having ostomy  output.  Hypokalemia: Replaced.  Hypomagnesemia: Replace and follow.  Hypertension: Blood pressures improved and currently controlled off of meds.  All home antihypertensives (thiazide, Zebeta and lisinopril) currently on hold.  Blood pressure starting to slowly creep up, will resume Zebeta.  Macrocytic anemia: Been stable in the 11 g range for the last 3 days.  Remote history of breast cancer: Some concern that above is recurrence of breast cancer.  Hopefully cytopathology will help.  Hyperlipidemia GERD Anxiety Initiated IV Protonix.  Body mass index is 27.74 kg/m.   DVT prophylaxis: SCDs Start: 10/15/21 1853     Code Status: Full Code:  Family Communication: Spoke was at bedside. Disposition:  Status is: Inpatient Remains inpatient appropriate because: Postop course, advancing diet.  Hopefully should be medically ready for discharge in 48 hours, pending surgical clearance.     Consultants:   Medical oncology General surgery  Procedures:     Antimicrobials:      Subjective:  Spouse at bedside.  Not much abdominal pain, controlled on meds.  Tolerating liquid diet.  States that ostomy bag was emptied a couple times.  No nausea or vomiting.  Objective:   Vitals:   10/18/21 0438 10/18/21 1454 10/18/21 2202 10/19/21 0536  BP: 116/70 133/64 134/62 (!) 144/84  Pulse: 86 98 (!) 51 91  Resp: 18 (!) '21 14 16  '$ Temp: 97.7 F (36.5 C) 98.7 F (37.1 C) 98.1 F (36.7 C) 98.1 F (36.7 C)  TempSrc: Oral  Oral Oral  SpO2: 95% 95% 91% 95%  Weight:    62.3 kg  Height:        General exam: Elderly female,  small built, frail lying comfortably propped up in bed without distress.  Oral mucosa moist. Respiratory system: Clear to auscultation.  No increased work of breathing. Cardiovascular system: S1 and S2 heard, RRR.  No JVD, murmurs or pedal edema. Gastrointestinal system: Removed abdominal binder temporarily to examine.  Abdomen is nondistended, soft and nontender.   Ostomy with small amount of watery green liquid.  G-tube clamped.  Hyperactive bowel sounds heard. Central nervous system: Alert and oriented. No focal neurological deficits. Extremities: Symmetric 5 x 5 power. Skin: No rashes, lesions or ulcers Psychiatry: Judgement and insight appear normal. Mood & affect appropriate.     Data Reviewed:   I have personally reviewed following labs and imaging studies   CBC: Recent Labs  Lab 10/17/21 0726 10/18/21 0622 10/19/21 0525  WBC 10.1 7.5 7.9  HGB 11.5* 10.7* 11.3*  HCT 35.1* 32.9* 34.6*  MCV 104.8* 105.4* 105.2*  PLT 313 278 829    Basic Metabolic Panel: Recent Labs  Lab 10/15/21 1924 10/16/21 0544 10/17/21 0726 10/18/21 0622 10/19/21 0525  NA 132* 135 136 137 136  K 3.8 3.8 4.1 3.2* 3.8  CL 98 102 103 106 110  CO2 '25 25 22 22 '$ 21*  GLUCOSE 125* 90 92 88 89  BUN '14 13 13 13 9  '$ CREATININE 0.62 0.50 0.83 0.59 0.41*  CALCIUM 9.4 9.6 8.4* 8.3* 8.2*  MG  --   --   --   --  1.6*    Liver Function Tests: Recent Labs  Lab 10/15/21 1924 10/16/21 0544  AST 23 23  ALT 11 11  ALKPHOS 43 38  BILITOT 0.6 0.8  PROT 6.4* 6.1*  ALBUMIN 3.5 3.5    CBG: No results for input(s): "GLUCAP" in the last 168 hours.  Microbiology Studies:  No results found for this or any previous visit (from the past 240 hour(s)).  Radiology Studies:  No results found.  Scheduled Meds:    acetaminophen  650 mg Oral Q6H   feeding supplement  1 Container Oral TID BM   lip balm   Topical BID   methocarbamol  500 mg Oral Q8H   pantoprazole (PROTONIX) IV  40 mg Intravenous Q24H   sodium chloride flush  3 mL Intravenous Q12H    Continuous Infusions:    sodium chloride 50 mL/hr at 10/19/21 0012   ondansetron (ZOFRAN) IV       LOS: 4 days     Vernell Leep, MD,  FACP, [facility], [facility], [facility] (Care Management Physician Certified) [place]  To contact the attending provider between 7A-7P or the covering  provider during after hours 7P-7A, please log into the web site [email or web address] and access using universal [place] password for that web site. If you do not have the password, please call the hospital operator.  10/19/2021, 11:32 AM

## 2021-10-20 DIAGNOSIS — I10 Essential (primary) hypertension: Secondary | ICD-10-CM | POA: Diagnosis not present

## 2021-10-20 DIAGNOSIS — K6389 Other specified diseases of intestine: Secondary | ICD-10-CM | POA: Diagnosis not present

## 2021-10-20 LAB — BASIC METABOLIC PANEL
Anion gap: 5 (ref 5–15)
BUN: 6 mg/dL — ABNORMAL LOW (ref 8–23)
CO2: 21 mmol/L — ABNORMAL LOW (ref 22–32)
Calcium: 8.4 mg/dL — ABNORMAL LOW (ref 8.9–10.3)
Chloride: 109 mmol/L (ref 98–111)
Creatinine, Ser: 0.45 mg/dL (ref 0.44–1.00)
GFR, Estimated: >60 mL/min (ref >60)
Glucose, Bld: 105 mg/dL — ABNORMAL HIGH (ref 70–99)
Potassium: 4.1 mmol/L (ref 3.5–5.1)
Sodium: 135 mmol/L (ref 135–145)

## 2021-10-20 LAB — CBC
HCT: 37.8 % (ref 36.0–46.0)
Hemoglobin: 12.3 g/dL (ref 12.0–15.0)
MCH: 34.6 pg — ABNORMAL HIGH (ref 26.0–34.0)
MCHC: 32.5 g/dL (ref 30.0–36.0)
MCV: 106.5 fL — ABNORMAL HIGH (ref 80.0–100.0)
Platelets: 317 10*3/uL (ref 150–400)
RBC: 3.55 MIL/uL — ABNORMAL LOW (ref 3.87–5.11)
RDW: 13.3 % (ref 11.5–15.5)
WBC: 6.3 10*3/uL (ref 4.0–10.5)
nRBC: 0 % (ref 0.0–0.2)

## 2021-10-20 LAB — MAGNESIUM: Magnesium: 2.1 mg/dL (ref 1.7–2.4)

## 2021-10-20 MED ORDER — PANTOPRAZOLE SODIUM 40 MG PO TBEC
40.0000 mg | DELAYED_RELEASE_TABLET | Freq: Every day | ORAL | Status: DC
  Administered 2021-10-20 – 2021-10-21 (×2): 40 mg via ORAL
  Filled 2021-10-20 (×2): qty 1

## 2021-10-20 MED ORDER — ACETAMINOPHEN 325 MG PO TABS: 650.0000 mg | ORAL_TABLET | Freq: Four times a day (QID) | ORAL | Status: DC | PRN

## 2021-10-20 NOTE — TOC Progression Note (Signed)
Transition of Care [facility]) - Progression Note    Patient Details  Name: Nancy Harrison MRN: [id number] Date of Birth: 06-01-1940  Transition [facility]) CM/SW Contact  Servando Snare, [place] Phone Number: 10/20/2021, 10:41 AM  Clinical Narrative:   [facility] consulted for DME youth rolling walker. TOC made referral to [employer]. Danielle, rep for [employer] took regular walker to patient room. Patient stated too tall. Danielle asked if she would like her to order a youth walker and patient declined, stating " I won't need it".     Expected Discharge Plan: Home/Self Care Barriers to Discharge: Continued Medical Work up  Expected Discharge Plan and Services Expected Discharge Plan: Home/Self Care In-house Referral: NA Discharge Planning Services: NA Post Acute Care Choice: Durable Medical Equipment Living arrangements for the past 2 months: Single Family Home                 DME Arranged: Gilford Rile (2 wheeled youth- Patient declined) DME Agency: [employer] Date DME Agency Contacted: 10/20/21 Time DME Agency Contacted: [phone] Representative spoke with at DME Agency: Andee Poles (Patient declined ordering walker " I won't need it") HH Arranged: NA HH Agency: NA         Social Determinants of Health (SDOH) Interventions    Readmission Risk Interventions     No data to display

## 2021-10-20 NOTE — Progress Notes (Signed)
Mobility Specialist - Progress Note   10/20/21 1301  Mobility  Activity Ambulated with assistance in hallway  Level of Assistance Standby assist, set-up cues, supervision of patient - no hands on  Assistive Device Front wheel walker  Distance Ambulated (ft) 500 ft  Activity Response Tolerated well  $Mobility charge 1 Mobility   Pt received in chair and agreed to mobilize. No c/o pain or dizziness during ambulation. Pt claimed discomfort in abdomen area during ambulation. Took one standing rest break halfway through. Pt left in bed with all needs met and call bell within reach.   Roderick Pee Mobility Specialist

## 2021-10-20 NOTE — Consult Note (Signed)
[place] Nurse ostomy follow up Patient receiving care in [facility] pouching system placed to RUQ ileostomy in good shape, no s/s of impending leakage, no washout. A small amount of liquid feces, no gas. Patient explained she had been paying close attention when staff empties the pouch, but she has not done that yet. I explained it is very important for her to begin doing this each time it is needed. She agreed to do so.  Val Riles, RN, MSN, CWOCN, CNS-BC, pager [phone]

## 2021-10-20 NOTE — Progress Notes (Signed)
4 Days Post-Op   Subjective/Chief Complaint: Tolerating full liquids No nausea or vomiting Gastrostomy tube remains clamped Ileostomy productive  Objective: Vital signs in last 24 hours: Temp:  [97.9 F (36.6 C)-98.2 F (36.8 C)] 97.9 F (36.6 C) (08/29 0533) Pulse Rate:  [72-103] 72 (08/29 0533) Resp:  [16-18] 18 (08/29 0533) BP: (130-146)/(72-87) 143/72 (08/29 0533) SpO2:  [94 %-97 %] 97 % (08/29 0533) Last BM Date : 10/19/21  Intake/Output from previous day: 08/28 0701 - 08/29 0700 In: 535.2 [P.O.:480; IV Piggyback:55.2] Out: 3 [Urine:3] Intake/Output this shift: No intake/output data recorded.  Gen:  Alert, NAD, pleasant Pulm:  rate and effort normal on room air Abd: Soft, minimal distension, appropriately tender, lap incisions cdi, ileostomy viable with liquid brown stool in pouch. G tube clamped  Lab Results:  Recent Labs    10/19/21 0525 10/20/21 0707  WBC 7.9 6.3  HGB 11.3* 12.3  HCT 34.6* 37.8  PLT 311 317   BMET Recent Labs    10/19/21 0525 10/20/21 0707  NA 136 135  K 3.8 4.1  CL 110 109  CO2 21* 21*  GLUCOSE 89 105*  BUN 9 6*  CREATININE 0.41* 0.45  CALCIUM 8.2* 8.4*   PT/INR No results for input(s): "LABPROT", "INR" in the last 72 hours. ABG No results for input(s): "PHART", "HCO3" in the last 72 hours.  Invalid input(s): "PCO2", "PO2"  Studies/Results: No results found.  Anti-infectives: Anti-infectives (From admission, onward)    Start     Dose/Rate Route Frequency Ordered Stop   10/17/21 0600  cefoTEtan (CEFOTAN) 2 g in sodium chloride 0.9 % 100 mL IVPB        2 g 200 mL/hr over 30 Minutes Intravenous Every 12 hours 10/16/21 2159 10/17/21 0646   10/16/21 1600  cefoTEtan (CEFOTAN) 2 g in sodium chloride 0.9 % 100 mL IVPB        2 g 200 mL/hr over 30 Minutes Intravenous On call to O.R. 10/16/21 1059 10/16/21 1820       Assessment/Plan: Carcinomatosis POD #4 loop ileostomy, g tube, peritoneal biopsy-SG 8/25 -advance to  soft diet, add Boost. Keep G tube clamped unless she becomes nauseated -PT -await pathology, CEA 2.5 -continue scheduled tylenol and robaxin, encouraged patient to take oxycodone prior to mobilizing to help with pain control   ID - cefotetan periop FEN - IVF, FLD, Boost VTE - per primary/ ok for chemical dvt ppx from surgical standpoint Foley - none   HTN HLD GERD Anxiety Remote h/o breast cancer  LOS: 5 days    Nancy Harrison 10/20/2021

## 2021-10-20 NOTE — Progress Notes (Signed)
PROGRESS NOTE   Nancy Harrison  ZCH:[id number]    DOB: 02/15/1941    DOA: 10/15/2021  PCP: Kristen Loader, FNP   I have briefly reviewed patients previous medical [facility] in [facility].  Chief complaint Obstructing colonic mass   Brief Narrative:  81 year old female with remote history of breast cancer, s/p lumpectomy and chemoradiation in the 90s, HTN, HLD, GERD, goiter, seen at the cancer center on 10/15/2021, CT abdomen and pelvis from 10/09/2021 and her clinical picture were consistent with colonic obstruction secondary to malignancy and she was directed to the [facility] long ED for admission.  General surgery and medical oncology have consulted.  S/p laparoscopic loop ileostomy, G-tube and peritoneal biopsy on 8/25.  Advancing diet and tolerating.  Surgical pathology's still not back.  Hopeful discharge home 8/30 or 31 pending surgical clearance.   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  Patient's PCP ordered a CT which showed a right lower lobe lung nodule and dilatation of the transverse colon with a mass at the distal transverse colon consistent with malignancy, lymphadenopathy and omental caking.  She was then referred to medical oncology, seen at the cancer center on 8/24, clinical and radiological picture consistent with bowel obstruction and patient was transferred to the hospital for admission.  Medical oncology and general surgery have consulted. S/p laparoscopic loop ileostomy, G-tube and peritoneal biopsy on 8/25 by Dr. Michael Boston. Follow  cytopathology-still pending.  CCS continue to manage surgical issues.  NGT, Foley out.  G-tube clamped, advancing diet-up to soft diet today.  Having ostomy output.  Hypokalemia: Replaced.  Hypomagnesemia: Replaced.  Hypertension: Antihypertensives were initially held.  Zebeta restarted.  Holding thiazide and lisinopril.  Controlled.    Macrocytic anemia: Stable.  Hemoglobin up to 12.3.  Remote history of breast cancer: Some concern that above is recurrence of breast cancer.  Hopefully cytopathology will help.  Hyperlipidemia GERD Anxiety Initiated IV Protonix, changed to oral.  Body mass index is 27.74 kg/m.   DVT prophylaxis: SCDs Start: 10/15/21 1853     Code Status: Full Code:  Family Communication: None at bedside Disposition:  Status is: Inpatient Remains inpatient appropriate because: Postop course, advancing diet.  Hopefully should be medically ready for discharge 8/30 or 31, pending surgical clearance.     Consultants:   Medical oncology General surgery  Procedures:     Antimicrobials:      Subjective:  Tolerating full liquids.  Diet advanced to soft diet this morning was yet to try.  Having ostomy output.  Minimal to no pain.  Ambulated multiple times in the hall yesterday.  Objective:   Vitals:   10/19/21 1354 10/19/21 2153 10/20/21 0533 10/20/21 1323  BP: (!) 146/87 130/75 (!) 143/72 128/87  Pulse: (!) 103 76 72 81  Resp: '16 18 18 18  '$ Temp: 98 F (36.7 C) 98.2 F (36.8 C) 97.9 F (36.6 C) 98.3 F (36.8 C)  TempSrc: Oral Oral Oral Oral  SpO2: 95% 94% 97% 100%  Weight:      Height:        General exam:  Elderly female, small built, frail lying comfortably propped up in bed without distress.  Oral mucosa moist. Respiratory system: Clear to auscultation.  No increased work of breathing. Cardiovascular system: S1 and S2 heard, RRR.  No JVD, murmurs or pedal edema. Gastrointestinal system: Abdomen is nondistended, soft and nontender.  Normal  bowel sounds heard.  G-tube clamped.  Surgical site dressings clean and dry.  Ostomy with liquid output. Central nervous system: Alert and oriented. No focal neurological deficits. Extremities: Symmetric 5 x 5 power. Skin: No rashes, lesions or ulcers Psychiatry: Judgement and insight appear normal. Mood & affect appropriate.     Data Reviewed:   I have personally reviewed following labs and imaging studies   CBC: Recent Labs  Lab 10/18/21 0622 10/19/21 0525 10/20/21 0707  WBC 7.5 7.9 6.3  HGB 10.7* 11.3* 12.3  HCT 32.9* 34.6* 37.8  MCV 105.4* 105.2* 106.5*  PLT 278 311 132    Basic Metabolic Panel: Recent Labs  Lab 10/16/21 0544 10/17/21 0726 10/18/21 0622 10/19/21 0525 10/20/21 0707  NA 135 136 137 136 135  K 3.8 4.1 3.2* 3.8 4.1  CL 102 103 106 110 109  CO2 '25 22 22 '$ 21* 21*  GLUCOSE 90 92 88 89 105*  BUN '13 13 13 9 '$ 6*  CREATININE 0.50 0.83 0.59 0.41* 0.45  CALCIUM 9.6 8.4* 8.3* 8.2* 8.4*  MG  --   --   --  1.6* 2.1    Liver Function Tests: Recent Labs  Lab 10/15/21 1924 10/16/21 0544  AST 23 23  ALT 11 11  ALKPHOS 43 38  BILITOT 0.6 0.8  PROT 6.4* 6.1*  ALBUMIN 3.5 3.5    CBG: No results for input(s): "GLUCAP" in the last 168 hours.  Microbiology Studies:  No results found for this or any previous visit (from the past 240 hour(s)).  Radiology Studies:  No results found.  Scheduled Meds:    bisoprolol  10 mg Oral Daily   feeding supplement  1 Container Oral TID BM   lip balm   Topical BID   methocarbamol  500 mg Oral Q8H   pantoprazole (PROTONIX) IV  40 mg Intravenous Q24H   sodium chloride flush  3 mL Intravenous Q12H    Continuous Infusions:    ondansetron (ZOFRAN) IV       LOS: 5 days     Vernell Leep, MD,  FACP, [facility], [facility], [facility] (Care Management Physician Certified) [employer]  To contact the attending provider between 7A-7P or the covering provider during after hours 7P-7A, please  log into the web site [email or web address] and access using universal [place] password for that web site. If you do not have the password, please call the hospital operator.  10/20/2021, 2:58 PM

## 2021-10-20 NOTE — Progress Notes (Signed)
ACS RISK CALCULATOR USE:  Risk Calculator was used for discussion of surgery: Yes     

## 2021-10-21 DIAGNOSIS — K56609 Unspecified intestinal obstruction, unspecified as to partial versus complete obstruction: Secondary | ICD-10-CM

## 2021-10-21 LAB — BASIC METABOLIC PANEL
Anion gap: 5 (ref 5–15)
BUN: 7 mg/dL — ABNORMAL LOW (ref 8–23)
CO2: 21 mmol/L — ABNORMAL LOW (ref 22–32)
Calcium: 8.4 mg/dL — ABNORMAL LOW (ref 8.9–10.3)
Chloride: 108 mmol/L (ref 98–111)
Creatinine, Ser: 0.38 mg/dL — ABNORMAL LOW (ref 0.44–1.00)
GFR, Estimated: >60 mL/min (ref >60)
Glucose, Bld: 113 mg/dL — ABNORMAL HIGH (ref 70–99)
Potassium: 3.9 mmol/L (ref 3.5–5.1)
Sodium: 134 mmol/L — ABNORMAL LOW (ref 135–145)

## 2021-10-21 LAB — CEA: CEA: 2.5 ng/mL (ref 0.0–4.7)

## 2021-10-21 LAB — CBC
HCT: 36.8 % (ref 36.0–46.0)
Hemoglobin: 12 g/dL (ref 12.0–15.0)
MCH: 34 pg (ref 26.0–34.0)
MCHC: 32.6 g/dL (ref 30.0–36.0)
MCV: 104.2 fL — ABNORMAL HIGH (ref 80.0–100.0)
Platelets: 367 10*3/uL (ref 150–400)
RBC: 3.53 MIL/uL — ABNORMAL LOW (ref 3.87–5.11)
RDW: 13.4 % (ref 11.5–15.5)
WBC: 6.5 10*3/uL (ref 4.0–10.5)
nRBC: 0 % (ref 0.0–0.2)

## 2021-10-21 LAB — MAGNESIUM: Magnesium: 1.8 mg/dL (ref 1.7–2.4)

## 2021-10-21 MED ORDER — ENOXAPARIN SODIUM 40 MG/0.4ML IJ SOSY
40.0000 mg | PREFILLED_SYRINGE | Freq: Every day | INTRAMUSCULAR | Status: DC
  Administered 2021-10-21 – 2021-10-22 (×2): 40 mg via SUBCUTANEOUS
  Filled 2021-10-21 (×2): qty 0.4

## 2021-10-21 MED ORDER — HYDROMORPHONE HCL 1 MG/ML IJ SOLN: 0.5000 mg | INTRAMUSCULAR | Status: DC | PRN

## 2021-10-21 NOTE — Progress Notes (Signed)
PROGRESS NOTE  Nancy Harrison WEX:[id number] DOB: Jan 13, 1941 DOA: 10/15/2021 PCP: Kristen Loader, FNP   LOS: 6 days   Brief Narrative / Interim history: 81 year old female with remote history of breast cancer, s/p lumpectomy and chemoradiation in the 90s, HTN, HLD, GERD, goiter, seen at the cancer center on 10/15/2021, CT abdomen and pelvis from 10/09/2021 and her clinical picture were consistent with colonic obstruction secondary to malignancy and she was directed to the [facility] long ED for admission.  General surgery and medical oncology have consulted.  S/p laparoscopic loop ileostomy, G-tube and peritoneal biopsy on 8/25.  Advancing diet and tolerating.  Surgical pathology's still not back.  Hopeful discharge 8/31 pending surgical clearance.  Subjective / 24h Interval events: Doing well this morning, no significant complaints.  No nausea or vomiting.  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  Patient's PCP ordered a CT which showed a right lower lobe lung nodule and dilatation of the transverse colon with a mass at the distal transverse colon consistent with malignancy, lymphadenopathy and omental caking.  She was then referred to medical oncology, seen at the cancer center on 8/24, clinical and radiological picture consistent with bowel obstruction and patient was transferred to the hospital for admission.  Medical oncology and general surgery have consulted. S/p laparoscopic loop ileostomy, G-tube and peritoneal biopsy on 8/25 by  Dr. Michael Boston. Follow cytopathology-still pending.  CCS continue to manage surgical issues.  NGT, Foley out.  G-tube clamped, advancing diet today per general surgery.  She is having ostomy output.  May be ready for discharge tomorrow if tolerating diet   Active problems Hypokalemia-Replaced.  Normalized this morning  Hypomagnesemia-Replaced.  Normalized this morning   Hypertension-Antihypertensives were initially held.  Zebeta restarted.  Continue to hold thiazide and lisinopril.  Blood pressure stable   Macrocytic anemia-Stable.  Hemoglobin now up to 12.0   Remote history of breast cancer-Some concern that above is recurrence of breast cancer.  Hopefully cytopathology will help.   Hyperlipidemia GERD Anxiety  Scheduled Meds:  bisoprolol  10 mg Oral Daily   enoxaparin (LOVENOX) injection  40 mg Subcutaneous Daily   feeding supplement  1 Container Oral TID BM   lip balm   Topical BID   methocarbamol  500 mg Oral Q8H   pantoprazole  40 mg Oral Daily   sodium chloride flush  3 mL Intravenous Q12H   Continuous Infusions:  ondansetron (ZOFRAN) IV     PRN Meds:.acetaminophen, HYDROmorphone (DILAUDID) injection, magic mouthwash, menthol-cetylpyridinium, ondansetron (ZOFRAN) IV **OR** ondansetron (ZOFRAN) IV, oxyCODONE, phenol, prochlorperazine  Diet Orders (From admission, onward)     Start     Ordered   10/21/21 1007  DIET SOFT Room service appropriate? Yes; Fluid consistency: Thin  Diet effective now       Question Answer Comment  Room service appropriate? Yes   Fluid consistency: Thin      10/21/21 1006            DVT prophylaxis: enoxaparin (LOVENOX) injection 40 mg Start: 10/21/21 1200 SCDs Start: 10/15/21 1853  Lab Results  Component Value Date   PLT 367 10/21/2021      Code Status: Full Code  Family Communication: No family at bedside  Status is: Inpatient Remains inpatient appropriate because: Possibly home tomorrow   Level of care:  Med-Surg  Consultants:  General surgery Oncology  Objective: Vitals:   10/19/21 2153 10/20/21 0533 10/20/21 1323 10/20/21 2022  BP: 130/75 (!) 143/72 128/87 130/71  Pulse: 76 72 81 81  Resp: '18 18 18 18  '$ Temp: 98.2 F (36.8 C) 97.9 F (36.6 C) 98.3 F (36.8 C) 98.6 F (37 C)  TempSrc: Oral Oral Oral Oral  SpO2: 94% 97% 100% 97%  Weight:      Height:       No intake or output data in the 24 hours ending 10/21/21 1128 Wt Readings from Last 3 Encounters:  10/19/21 62.3 kg  10/15/21 57.6 kg  10/06/16 70.8 kg    Examination:  Constitutional: NAD Eyes: no scleral icterus ENMT: Mucous membranes are moist.  Neck: normal, supple Respiratory: clear to auscultation bilaterally, no wheezing, no crackles. Normal respiratory effort. No accessory muscle use.  Cardiovascular: Regular rate and rhythm, no murmurs / rubs / gallops. No LE edema.  Abdomen: non distended, no tenderness. Bowel sounds positive.  Musculoskeletal: no clubbing / cyanosis.  Skin: no rashes Neurologic: non focal   Data Reviewed: I have independently reviewed following labs and imaging studies   CBC Recent Labs  Lab 10/17/21 0726 10/18/21 0622 10/19/21 0525 10/20/21 0707 10/21/21 0630  WBC 10.1 7.5 7.9 6.3 6.5  HGB 11.5* 10.7* 11.3* 12.3 12.0  HCT 35.1* 32.9* 34.6* 37.8 36.8  PLT 313 278 311 317 367  MCV 104.8* 105.4* 105.2* 106.5* 104.2*  MCH 34.3* 34.3* 34.3* 34.6* 34.0  MCHC 32.8 32.5 32.7 32.5 32.6  RDW 13.3 13.6 13.5 13.3 13.4    Recent Labs  Lab 10/15/21 1924 10/16/21 0544 10/17/21 0726 10/18/21 0622 10/19/21 0525 10/20/21 0707 10/21/21 0630  NA 132* 135 136 137 136 135 134*  K 3.8 3.8 4.1 3.2* 3.8 4.1 3.9  CL 98 102 103 106 110 109 108  CO2 '25 25 22 22 '$ 21* 21* 21*  GLUCOSE 125* 90 92 88 89 105* 113*  BUN '14 13 13 13 9 '$ 6* 7*  CREATININE 0.62 0.50 0.83 0.59 0.41* 0.45 0.38*  CALCIUM 9.4 9.6 8.4* 8.3* 8.2* 8.4* 8.4*  AST 23 23  --   --   --   --   --   ALT 11 11  --   --   --    --   --   ALKPHOS 43 38  --   --   --   --   --   BILITOT 0.6 0.8  --   --   --   --   --   ALBUMIN 3.5 3.5  --   --   --   --   --   MG  --   --   --   --  1.6* 2.1 1.8    ------------------------------------------------------------------------------------------------------------------ No results for input(s): "CHOL", "HDL", "LDLCALC", "TRIG", "CHOLHDL", "LDLDIRECT" in the last 72 hours.  No results found for: "HGBA1C" ------------------------------------------------------------------------------------------------------------------ No results for input(s): "TSH", "T4TOTAL", "T3FREE", "THYROIDAB" in the last 72 hours.  Invalid input(s): "FREET3"  Cardiac Enzymes No results for input(s): "CKMB", "TROPONINI", "MYOGLOBIN" in the last 168 hours.  Invalid input(s): "CK" ------------------------------------------------------------------------------------------------------------------ No results found for: "BNP"  CBG: No results for input(s): "GLUCAP" in the last  168 hours.  No results found for this or any previous visit (from the past 240 hour(s)).   Radiology Studies: No results found.   Marzetta Board, MD, PhD [facility]  Between 7 am - 7 pm I am available, please contact me via Amion (for emergencies) or Securechat (non urgent messages)  Between 7 pm - 7 am I am not available, please contact night coverage MD/APP via Amion

## 2021-10-21 NOTE — Progress Notes (Signed)
[facility] Progress Note  5 Days Post-Op  Subjective: CC-  Feeling well today. Abdominal pain well controlled. Denies n/v. Tolerating full liquids. Also drinking boost. Feels hungry. Anxious to get OOB and walk more. Feels like she needs more education on ostomy care.  Objective: Vital signs in last 24 hours: Temp:  [98.3 F (36.8 C)-98.6 F (37 C)] 98.6 F (37 C) (08/29 2022) Pulse Rate:  [81] 81 (08/29 2022) Resp:  [18] 18 (08/29 2022) BP: (128-130)/(71-87) 130/71 (08/29 2022) SpO2:  [97 %-100 %] 97 % (08/29 2022) Last BM Date : 10/21/21  Intake/Output from previous day: No intake/output data recorded. Intake/Output this shift: No intake/output data recorded.  PE: Gen:  Alert, NAD, pleasant Pulm:  rate and effort normal on room air Abd: Soft, minimal distension, appropriately tender over incisions, lap incisions cdi, ileostomy viable with liquid brown stool in pouch. G tube clamped   Lab Results:  Recent Labs    10/20/21 0707 10/21/21 0630  WBC 6.3 6.5  HGB 12.3 12.0  HCT 37.8 36.8  PLT 317 367   BMET Recent Labs    10/20/21 0707 10/21/21 0630  NA 135 134*  K 4.1 3.9  CL 109 108  CO2 21* 21*  GLUCOSE 105* 113*  BUN 6* 7*  CREATININE 0.45 0.38*  CALCIUM 8.4* 8.4*   PT/INR No results for input(s): "LABPROT", "INR" in the last 72 hours. CMP     Component Value Date/Time   NA 134 (L) 10/21/2021 0630   K 3.9 10/21/2021 0630   CL 108 10/21/2021 0630   CO2 21 (L) 10/21/2021 0630   GLUCOSE 113 (H) 10/21/2021 0630   BUN 7 (L) 10/21/2021 0630   CREATININE 0.38 (L) 10/21/2021 0630   CALCIUM 8.4 (L) 10/21/2021 0630   PROT 6.1 (L) 10/16/2021 0544   ALBUMIN 3.5 10/16/2021 0544   AST 23 10/16/2021 0544   ALT 11 10/16/2021 0544   ALKPHOS 38 10/16/2021 0544   BILITOT 0.8 10/16/2021 0544   GFRNONAA >60 10/21/2021 0630   Lipase  No results found for: "LIPASE"     Studies/Results: No results found.  Anti-infectives: Anti-infectives  (From admission, onward)    Start     Dose/Rate Route Frequency Ordered Stop   10/17/21 0600  cefoTEtan (CEFOTAN) 2 g in sodium chloride 0.9 % 100 mL IVPB        2 g 200 mL/hr over 30 Minutes Intravenous Every 12 hours 10/16/21 2159 10/17/21 0646   10/16/21 1600  cefoTEtan (CEFOTAN) 2 g in sodium chloride 0.9 % 100 mL IVPB        2 g 200 mL/hr over 30 Minutes Intravenous On call to O.R. 10/16/21 1059 10/16/21 1820        Assessment/Plan Carcinomatosis POD #5 loop ileostomy, g tube, peritoneal biopsy-SG 8/25 -advance to soft diet. Keep G tube clamped -PT/mobilize, may need home PT -await pathology, CEA 2.5 -will reach out to [facility] to see if they could work with her again for ostomy education. Referral sent to ostomy clinic. -may be ready for discharge tomorrow if tolerating diet and feeling more comfortable with ostomy care   ID - cefotetan periop FEN - IVF, soft, Boost VTE - start lovenox Foley - none   HTN HLD GERD Anxiety Remote h/o breast cancer    LOS: 6 days    Wellington [place], [facility] 10/21/2021, 10:59 AM Please see Amion for pager number during day hours 7:00am-4:30pm

## 2021-10-21 NOTE — Progress Notes (Signed)
Mobility Specialist - Progress Note   10/21/21 1251  Mobility  Activity Ambulated with assistance in hallway  Level of Assistance Modified independent, requires aide device or extra time  Assistive Device Front wheel walker  Distance Ambulated (ft) 500 ft  Activity Response Tolerated well  $Mobility charge 1 Mobility   Pt received in chair and agreed for mobility. Some discomfort in abdomen area but not pain. Pt left in bed with all needs met and call bell within reach.   Roderick Pee Mobility Specialist

## 2021-10-21 NOTE — Progress Notes (Signed)
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 Pt is now s/p laparoscopic loop ileostomy, G-tube and peritoneal biopsy on 8/25.    PT Comments    Patient making good progress despite reporting limited bouts of ambulation over the last 2 days. Pt ambulated ~400' with RW and supervision, spouse present and educated on safe guarding position. Pt completed stair negotiation with single rail and min guard for safety. EOS reviewed importance of mobility and encouraged to ambulate with RN/NT staff and spouse as able with RW. Informed RN of goal to ambulate 4-6x/day. She is waiting on youth RW and is now agreeable to HHPT. Will continue to progress as able.    Recommendations for follow up therapy are one component of a multi-disciplinary discharge planning process, led by the attending physician.  Recommendations may be updated based on patient status, additional functional criteria and insurance authorization.  Follow Up Recommendations  Home health PT     Assistance Recommended at Discharge Intermittent Supervision/Assistance  Patient can return home with the following A little help with walking and/or transfers;A little help with bathing/dressing/bathroom;Assist for transportation;Help with stairs or ramp for entrance;Assistance with cooking/housework   Equipment Recommendations  Rolling walker (2 wheels) (youth)    Recommendations for Other Services       Precautions / Restrictions Precautions Precautions: Fall Precaution Comments: Rt  ostomy, ~1 fall in last 6 months Restrictions Weight Bearing Restrictions: No     Mobility  Bed Mobility Overal bed mobility: Needs Assistance Bed Mobility: Supine to Sit, Sit to Supine     Supine to sit: Min assist, HOB elevated Sit to supine: Min guard, HOB elevated   General bed mobility comments: pt taking extra time to roll and using bed rail to facilitate. pt able to raise trunk and scoot to EOB.    Transfers Overall transfer level: Needs assistance Equipment used: Rolling walker (2 wheels) Transfers: Sit to/from Stand Sit to Stand: Supervision           General transfer comment: sup for safety, pt using bil UE to power up from EOB.    Ambulation/Gait Ambulation/Gait assistance: Min guard, Supervision Gait Distance (Feet): 400 Feet Assistive device: Rolling walker (2 wheels), None Gait Pattern/deviations: Step-through pattern, Decreased stride length Gait velocity: decr     General Gait Details: attempted gait without RW and pt reaching for exernal support of furniture/hall rails; guarding for safety. Pt progressed to supervision level with RW and spouse present providing guarding with cues from therapist.   Stairs Stairs: Yes Stairs assistance: Min guard Stair Management: One rail Right, Step to pattern, Sideways Number of Stairs: 5 General stair comments: cues for side step pattern for stair mobility. pt ascend/descend with guarding from spouse and PT.   Wheelchair Mobility    Modified Rankin (Stroke Patients Only)       Balance Overall balance assessment: Mild deficits observed, not formally tested  Cognition Arousal/Alertness: Awake/alert Behavior During Therapy: WFL for tasks assessed/performed Overall Cognitive Status: Within Functional Limits for tasks assessed                                          Exercises      General Comments        Pertinent  Vitals/Pain Pain Assessment Pain Assessment: Faces Faces Pain Scale: Hurts little more Pain Location: abdomen Pain Descriptors / Indicators: Discomfort, Sore, Guarding, Operative site guarding Pain Intervention(s): Limited activity within patient's tolerance, Monitored during session, Repositioned    Home Living                          Prior Function            PT Goals (current goals can now be found in the care plan section) Acute Rehab PT Goals Patient Stated Goal: get home and recover PT Goal Formulation: With patient Time For Goal Achievement: 11/02/21 Potential to Achieve Goals: Good Progress towards PT goals: Progressing toward goals    Frequency    Min 3X/week      PT Plan Current plan remains appropriate    Co-evaluation              AM-PAC PT "6 Clicks" Mobility   Outcome Measure  Help needed turning from your back to your side while in a flat bed without using bedrails?: A Little Help needed moving from lying on your back to sitting on the side of a flat bed without using bedrails?: A Little Help needed moving to and from a bed to a chair (including a wheelchair)?: A Little Help needed standing up from a chair using your arms (e.g., wheelchair or bedside chair)?: A Little Help needed to walk in hospital room?: A Little Help needed climbing 3-5 steps with a railing? : A Little 6 Click Score: 18    End of Session Equipment Utilized During Treatment: Gait belt (abdominal binder) Activity Tolerance: Patient tolerated treatment well Patient left: with call bell/phone within reach;in chair;with family/visitor present Nurse Communication: Mobility status PT Visit Diagnosis: Unsteadiness on feet (R26.81);Difficulty in walking, not elsewhere classified (R26.2);Muscle weakness (generalized) (M62.81)     Time: 4235-3614 PT Time Calculation (min) (ACUTE ONLY): 29 min  Charges:  $Gait Training: 23-37 mins                     Verner Mould,  DPT Acute Rehabilitation Services Office [phone] Pager [phone]  10/21/21 12:36 PM

## 2021-10-21 NOTE — TOC Progression Note (Addendum)
Transition of Care [facility]) - Progression Note    Patient Details  Name: Nancy Harrison MRN: [id number] Date of Birth: 1940-05-25  Transition of Care [facility]) CM/SW [place], LCSW Phone Number: 10/21/2021, 12:52 PM  Clinical Narrative:    CSW spoke with pt about recommendations for HHPT and RN. Pt has agreed to [facility] services. Pt stated she has had [facility] in the past but does not remember the agency. Pt stated she did not have a preferred agency. CSW informed pt a referral will be sent out to all agencies in the area to see which agency is able to accept her insurance. CSW informed pt her youth rolling walker will be delivered to her home. Pt was agreeable to this. TOC to follow.   Adden  1:16pm Pt is set up with [facility] RN and PT services with [facility]. Pt's walker will delivered to pt's home . No additional needs TOC sign off at this time.   Expected Discharge Plan: Home/Self Care Barriers to Discharge: Continued Medical Work up  Expected Discharge Plan and Services Expected Discharge Plan: Home/Self Care In-house Referral: NA Discharge Planning Services: NA Post Acute Care Choice: Durable Medical Equipment Living arrangements for the past 2 months: Single Family Home                 DME Arranged: Gilford Rile (2 wheeled youth- Patient declined) DME Agency: [employer] Date DME Agency Contacted: 10/20/21 Time DME Agency Contacted: [phone] Representative spoke with at DME Agency: Andee Poles (Patient declined ordering walker " I won't need it") HH Arranged: NA HH Agency: NA         Social Determinants of Health (SDOH) Interventions    Readmission Risk Interventions     No data to display

## 2021-10-21 NOTE — Consult Note (Signed)
[place] Nurse ostomy follow up Stoma type/location: RUQ ileostomy Stomal assessment/size: 1 3/4" edematous pink and moist Peristomal assessment: intact -obese abdomen with rounded folds on upper half.  Discussed pouching options with patient.  A smaller 2 piece (2 1/4") system due to her height of 4'11".  We also discuss 1 piece vs 2 piece pouches.  She is interested in a 1 piece system as she does not plan to take the pouch off to empty.  I will assist her with applying this today.   Treatment options for stomal/peristomal skin: 1 piece convex pouch.  I will not use a barrier ring.   Output liquid brown stool . Ostomy pouching: 1pc.convex.  She may need a belt.  Education provided: We remove old pouch, discuss pouching options and she wants to try a 1 piece.  She measures her stoma and cuts the barrier to fit.  She applies the new pouch and is able to roll closed.  She has been emptying her pouch.  This pouch has a filter and she is making a lot of gas at this time.  I explain that the filter will not work if it gets wet from liquid stool.  She is happy to have the filter at this time.  Enrolled patient in [employer] Discharge program: YEs today. She understands that [employer] is not a DME company and will not obligate her to a supply company.  Husband has not found out if their current CPAP company has ostomy services or not.  We will check back with patient on 1 piece vs 2.  And ensure she goes home with pouches. I discuss the outpatient ostomy clinic once she has returned home and she is interested in getting established.  Will follow.  Domenic Moras MSN, RN, FNP-BC [facility] Wound, Ostomy, Continence Nurse Pager [phone]

## 2021-10-22 ENCOUNTER — Inpatient Hospital Stay: Payer: Medicare HMO | Admitting: Oncology

## 2021-10-22 DIAGNOSIS — K56609 Unspecified intestinal obstruction, unspecified as to partial versus complete obstruction: Secondary | ICD-10-CM | POA: Diagnosis not present

## 2021-10-22 DIAGNOSIS — M6281 Muscle weakness (generalized): Secondary | ICD-10-CM | POA: Diagnosis not present

## 2021-10-22 MED ORDER — ACETAMINOPHEN 325 MG PO TABS: 650.0000 mg | ORAL_TABLET | Freq: Four times a day (QID) | ORAL | Status: DC | PRN

## 2021-10-22 MED ORDER — METHOCARBAMOL 500 MG PO TABS: 500.0000 mg | ORAL_TABLET | Freq: Four times a day (QID) | ORAL | 0 refills | Status: DC | PRN

## 2021-10-22 NOTE — Discharge Summary (Signed)
Physician Discharge Summary  Nancy Harrison VOJ:[id number] DOB: Mar 24, 1940 DOA: 10/15/2021  PCP: Kristen Loader, FNP  Admit date: 10/15/2021 Discharge date: 10/22/2021  Admitted From: home Disposition:  home  Recommendations for Outpatient Follow-up:  Follow up with Oncology and Surgery as scheduled  Home Health: PT, RN Equipment/Devices: none  Discharge Condition: stable CODE STATUS: Full code  HPI: Per admitting MD, Nancy Harrison is a 81 y.o. female with medical history significant of breast cancer, anxiety, hypertension, hyperlipidemia, GERD, goiter presenting with colonic mass and abdominal symptoms.  Patient was seen at cancer center today and due to severity of colonic mass and concern for developing obstruction Case was discussed with surgery who recommended medicine admission and surgery and oncology will consult. Patient has had several months of abdominal distention and about a month of intermittent diarrhea and abdominal pain.  Her PCP ordered a CT which showed a right lower lobe lung nodule and dilation of the transverse colon with a mass at the distal transverse colon consistent with malignancy.  Also noted with lymphadenopathy and omental caking.  Patient referred to oncology. Seen by oncology today and concern for this mass and developing/impending colonic obstruction based on recent symptoms.  Imaging and case was reviewed with general surgery by oncologist who recommended admission by medicine service and possible palliative resection this admission. Does report 10 to 12 pound weight loss in the last couple months.  Patient denies fevers, chills, chest pain, shortness of breath, constipation, nausea, vomiting.  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  Patient's PCP ordered a CT which showed a right lower lobe lung nodule and dilatation of the transverse colon with a mass at the distal transverse colon consistent with malignancy, lymphadenopathy and omental caking.  She was then referred to medical oncology, seen at the cancer center on 8/24, clinical and radiological picture consistent with bowel obstruction and patient was transferred to the hospital for admission.  Medical oncology and general surgery have consulted. S/p laparoscopic loop ileostomy, G-tube and peritoneal biopsy on 8/25 by Dr. Michael Harrison. Follow cytopathology-still pending. She improved well post op, tolerating a regular diet, good ostomy output, will be dc home in stable condition with surgery and oncology outpatient follow up.  Active problems Hypokalemia-normalized  Hypomagnesemia-normalized  Hypertension-resume home medications  Macrocytic anemia-Stable.   Remote history of breast cancer-Some concern that above is recurrence of breast cancer.  Hopefully cytopathology will help. Hyperlipidemia GERD Anxiety  Sepsis ruled out   Discharge Instructions  Discharge Instructions     Amb Referral to Ostomy Clinic   Complete by: As directed    Carcinomatosis s/p loop ileostomy, g tube, peritoneal biopsy 10/16/21   Reason for referral modifiers:  Information about lifestyle, intimacy, diet and clothing options Pre and post-operative counseling for ostomy management        Allergies as of 10/22/2021       Reactions   Sporanox [itraconazole] Rash        Medication List     TAKE these medications    acetaminophen 325 MG tablet Commonly known  as: TYLENOL Take 2 tablets (650 mg total) by mouth every 6 (six) hours as  needed for mild pain or fever.   bisoprolol-hydrochlorothiazide 10-6.25 MG tablet Commonly known as: ZIAC Take 1 tablet by mouth daily.   ibuprofen 200 MG tablet Commonly known as: ADVIL Take 400-600 mg by mouth daily as needed for mild pain. Aches and pain   lisinopril 10 MG tablet Commonly known as: ZESTRIL Take 10 mg by mouth daily.   loperamide 2 MG tablet Commonly known as: IMODIUM A-D Take 2-4 mg by mouth 4 (four) times daily as needed for diarrhea or loose stools.   loratadine 10 MG tablet Commonly known as: CLARITIN Take 10 mg by mouth See admin instructions. Take 10 mg by mouth one to two times a day as needed for allergies   methocarbamol 500 MG tablet Commonly known as: ROBAXIN Take 1 tablet (500 mg total) by mouth every 6 (six) hours as needed for muscle spasms.   MULTIVITAMIN PO Take 1 tablet by mouth daily with breakfast.   simethicone 125 MG chewable tablet Commonly known as: MYLICON Chew 419 mg by mouth every 6 (six) hours as needed for flatulence.               Durable Medical Equipment  (From admission, onward)           Start     Ordered   10/19/21 1427  For home use only DME Walker youth  Once       Comments: Youth Conservation officer, nature (front wheeled)  Question:  Patient needs a walker to treat with the following condition  Answer:  Difficulty walking   10/19/21 1426            Follow-up Information     [facility] Follow up in 2 day(s).   Specialty: [facility] Contact information: [address] [place] [address] [place], [place] [employer] Follow up.   Why: DME company Contact information: [address] [place] [address] [phone]         Michael Boston, MD. Call.   Specialties: General Surgery, Colon and Rectal Surgery Why: We are working on your appointment,call to confirm, Arrive 57mn early to check in, fill out paperwork, BEngineer, civil (consulting)ID and  iDoctor, general practiceinformation: [phone]         [facility]. Call.   Specialty: General Surgery Why: Their office will contact you for an appointment. Contact information: [address][id number] [phone]               Consultations: General surgery   Procedures/Studies:  DG Abd 1 View  Result Date: 10/16/2021 CLINICAL DATA:  Tumor in the distal transverse colon with omental metastases. Check NG tube placement. EXAM: ABDOMEN - 1 VIEW COMPARISON:  Abdomen from earlier today at 8:15 a.m., CT with IV and oral contrast 10/09/2021. FINDINGS: NGT is now in place, the tip projected at the mid body of stomach. There is a new PEG tube as well. Also newly noted is a large bore tube superimposing in the right hemiabdomen paralleling the course of the distal ascending and transverse colon with interval decompression of the previously dilated proximal transverse colon and partial decompression of the upstream central abdominal small bowel. Visceral shadows are stable.  No new abnormality is seen.  IMPRESSION: 1. NGT tip projects at the mid body of stomach. 2. New PEG tube just medial to the NG tube tip. 3. Large bore tubing superimposing over or within the distal ascending and proximal transverse colon with interval decompression of the previously dilated proximal colon, and partial decompression of the previously dilated upstream central abdominal small bowel. Electronically Signed   By: Telford Nab M.D.   On: 10/16/2021 20:57   DG Abd Portable 1V  Result Date: 10/16/2021 CLINICAL DATA:  Large bowel obstruction. EXAM: PORTABLE ABDOMEN - 1 VIEW COMPARISON:  CT of the abdomen and pelvis of October 09, 2021 FINDINGS: Transverse colon is slightly more dilated than on the most recent comparison at 8.7 cm though is less dilated than on the study of August 07, 2021 where reach 11 cm. There is some gas and stool in the rectum. Stool is seen in the collapsed descending colon. There are dilated centrally located small bowel loops with increase, some of these bowel loops may reach 5 cm or slightly greater. IMPRESSION: Persistent colonic obstruction. Caliber at 8.7 cm while diminished since June is slightly increased since August 18. At this caliber or above the patient may be at increased risk for perforation. Increasing small bowel distension in the central abdomen. In this patient with known colonic obstruction and peritoneal disease. Electronically Signed   By: Zetta Bills M.D.   On: 10/16/2021 08:28   CT ABDOMEN PELVIS W CONTRAST  Result Date: 10/09/2021 CLINICAL DATA:  Abdominal pain, diarrhea. EXAM: CT ABDOMEN AND PELVIS WITH CONTRAST TECHNIQUE: Multidetector CT imaging of the abdomen and pelvis was performed using the standard protocol following bolus administration of intravenous contrast. RADIATION DOSE REDUCTION: This exam was performed according to the departmental dose-optimization program which includes automated exposure control, adjustment of the mA and/or kV according to patient size and/or use of iterative reconstruction technique. CONTRAST:  43m ISOVUE-300 IOPAMIDOL (ISOVUE-300) INJECTION 61% COMPARISON:  None Available. FINDINGS: Lower chest: 13 x 11 mm nodule is noted in right lower lobe. Hepatobiliary: No focal liver abnormality is seen. No gallstones, gallbladder wall thickening, or biliary dilatation. Pancreas: Unremarkable. No pancreatic ductal dilatation or surrounding inflammatory changes. Spleen: Normal in size without focal abnormality. Adrenals/Urinary Tract: Adrenal glands are unremarkable. Small nonobstructive left renal calculus is noted. No hydronephrosis or renal obstruction is noted. Urinary bladder is unremarkable. Stomach/Bowel: The stomach appears normal. Severe dilatation of the right and proximal transverse colon is noted which  appears to be severely obstructed by large irregular mass in the distal transverse colon most consistent with colonic malignancy. Mild amount of stool is seen in the more distal nondilated sigmoid colon. Sigmoid diverticulosis is noted without inflammation. There also appears to be distal small bowel dilatation secondary to the colonic obstruction. Vascular/Lymphatic: Aortic atherosclerosis. Mildly enlarged periaortic adenopathy is noted which may represent metastatic disease. Reproductive: Uterus and bilateral adnexa are unremarkable. Other: Multiple peritoneal implants are noted, predominantly in the left side of the abdomen as well as omental caking, consistent with peritoneal carcinomatosis. Musculoskeletal: Multiple rounded lucencies are noted in both visualized proximal femurs concerning for metastatic disease. Multilevel degenerative disc disease is noted in the lumbar spine. IMPRESSION: There is a large irregular mass in the distal transverse colon most consistent with colonic malignancy. This causes severe obstruction of the more proximal transverse and right colon as well as the distal small bowel. Mild periaortic adenopathy is noted consistent with metastatic disease. Multiple large peritoneal implants are noted, particularly in the left side of the  abdomen as well as omental caking, most consistent with peritoneal carcinomatosis. Multiple rounded lucencies are noted in the visualized femurs concerning for metastatic disease. These results will be called to the ordering clinician or representative by the Radiologist Assistant, and communication documented in the PACS or zVision Dashboard. 13 x 11 mm nodule is noted in right lower lobe concerning for metastatic disease. Small nonobstructive left renal calculus. Aortic Atherosclerosis (ICD10-I70.0). Electronically Signed   By: Marijo Conception M.D.   On: 10/09/2021 15:29     Subjective: - no chest pain, shortness of breath, no abdominal pain, nausea or  vomiting.   Discharge Exam: BP 138/73 (BP Location: Right Arm)   Pulse 74   Temp 98.4 F (36.9 C) (Oral)   Resp 17   Ht '4\' 11"'$  (1.499 m)   Wt 62.3 kg   SpO2 96%   BMI 27.74 kg/m   General: Pt is alert, awake, not in acute distress Cardiovascular: RRR, S1/S2 +, no rubs, no gallops Respiratory: CTA bilaterally, no wheezing, no rhonchi Abdominal: Soft, NT, ND, bowel sounds + Extremities: no edema, no cyanosis    The results of significant diagnostics from this hospitalization (including imaging, microbiology, ancillary and laboratory) are listed below for reference.     Microbiology: No results found for this or any previous visit (from the past 240 hour(s)).   Labs: Basic Metabolic Panel: Recent Labs  Lab 10/17/21 0726 10/18/21 0622 10/19/21 0525 10/20/21 0707 10/21/21 0630  NA 136 137 136 135 134*  K 4.1 3.2* 3.8 4.1 3.9  CL 103 106 110 109 108  CO2 22 22 21* 21* 21*  GLUCOSE 92 88 89 105* 113*  BUN '13 13 9 '$ 6* 7*  CREATININE 0.83 0.59 0.41* 0.45 0.38*  CALCIUM 8.4* 8.3* 8.2* 8.4* 8.4*  MG  --   --  1.6* 2.1 1.8   Liver Function Tests: Recent Labs  Lab 10/15/21 1924 10/16/21 0544  AST 23 23  ALT 11 11  ALKPHOS 43 38  BILITOT 0.6 0.8  PROT 6.4* 6.1*  ALBUMIN 3.5 3.5   CBC: Recent Labs  Lab 10/17/21 0726 10/18/21 0622 10/19/21 0525 10/20/21 0707 10/21/21 0630  WBC 10.1 7.5 7.9 6.3 6.5  HGB 11.5* 10.7* 11.3* 12.3 12.0  HCT 35.1* 32.9* 34.6* 37.8 36.8  MCV 104.8* 105.4* 105.2* 106.5* 104.2*  PLT 313 278 311 317 367   CBG: No results for input(s): "GLUCAP" in the last 168 hours. Hgb A1c No results for input(s): "HGBA1C" in the last 72 hours. Lipid Profile No results for input(s): "CHOL", "HDL", "LDLCALC", "TRIG", "CHOLHDL", "LDLDIRECT" in the last 72 hours. Thyroid function studies No results for input(s): "TSH", "T4TOTAL", "T3FREE", "THYROIDAB" in the last 72 hours.  Invalid input(s): "FREET3" Urinalysis No results found for: "COLORURINE",  "APPEARANCEUR", "LABSPEC", "PHURINE", "GLUCOSEU", "HGBUR", "BILIRUBINUR", "KETONESUR", "PROTEINUR", "UROBILINOGEN", "NITRITE", "LEUKOCYTESUR"  FURTHER DISCHARGE INSTRUCTIONS:   Get Medicines reviewed and adjusted: Please take all your medications with you for your next visit with your Primary MD   Laboratory/radiological data: Please request your Primary MD to go over all hospital tests and procedure/radiological results at the follow up, please ask your Primary MD to get all Hospital records sent to his/her office.   In some cases, they will be blood work, cultures and biopsy results pending at the time of your discharge. Please request that your primary care M.D. goes through all the records of your hospital data and follows up on these results.   Also Note the following: If you  experience worsening of your admission symptoms, develop shortness of breath, life threatening emergency, suicidal or homicidal thoughts you must seek medical attention immediately by calling 911 or calling your MD immediately  if symptoms less severe.   You must read complete instructions/literature along with all the possible adverse reactions/side effects for all the Medicines you take and that have been prescribed to you. Take any new Medicines after you have completely understood and accpet all the possible adverse reactions/side effects.    Do not drive when taking Pain medications or sleeping medications (Benzodaizepines)   Do not take more than prescribed Pain, Sleep and Anxiety Medications. It is not advisable to combine anxiety,sleep and pain medications without talking with your primary care practitioner   Special Instructions: If you have smoked or chewed Tobacco  in the last 2 yrs please stop smoking, stop any regular Alcohol  and or any Recreational drug use.   Wear Seat belts while driving.   Please note: You were cared for by a hospitalist during your hospital stay. Once you are discharged, your  primary care physician will handle any further medical issues. Please note that NO REFILLS for any discharge medications will be authorized once you are discharged, as it is imperative that you return to your primary care physician (or establish a relationship with a primary care physician if you do not have one) for your post hospital discharge needs so that they can reassess your need for medications and monitor your lab values.  Time coordinating discharge: 40 minutes  SIGNED:  Marzetta Board, MD, PhD 10/22/2021, 8:14 AM

## 2021-10-22 NOTE — Consult Note (Signed)
[facility] Nurse ostomy follow up Patient explains to me she prefers the one piece convex pouch with filter she placed yesterday. It is [employer] item [phone]; our Kellie Simmering # is [phone].  I have asked the [place] to order 5 of these and to provide them to the patient before she is discharged. No further needs identified.  Val Riles, RN, MSN, CWOCN, CNS-BC, pager [phone]

## 2021-10-22 NOTE — Progress Notes (Addendum)
[facility] Progress Note  6 Days Post-Op  Subjective: CC-  Feeling well this morning. Tolerating soft diet. Ostomy productive. Denies n/v. Feeling more comfortable with ostomy care.  Objective: Vital signs in last 24 hours: Temp:  [98.3 F (36.8 C)-98.6 F (37 C)] 98.4 F (36.9 C) (08/31 0555) Pulse Rate:  [74-84] 74 (08/31 0555) Resp:  [17-18] 17 (08/31 0555) BP: (120-138)/(73-90) 138/73 (08/31 0555) SpO2:  [95 %-96 %] 96 % (08/31 0555) Last BM Date : 10/21/21  Intake/Output from previous day: No intake/output data recorded. Intake/Output this shift: No intake/output data recorded.  PE: Gen:  Alert, NAD, pleasant Pulm:  rate and effort normal on room air Abd: Soft, minimal distension, nontender, lap incisions cdi, ileostomy viable with liquid brown stool in pouch. G tube clamped   Lab Results:  Recent Labs    10/20/21 0707 10/21/21 0630  WBC 6.3 6.5  HGB 12.3 12.0  HCT 37.8 36.8  PLT 317 367   BMET Recent Labs    10/20/21 0707 10/21/21 0630  NA 135 134*  K 4.1 3.9  CL 109 108  CO2 21* 21*  GLUCOSE 105* 113*  BUN 6* 7*  CREATININE 0.45 0.38*  CALCIUM 8.4* 8.4*   PT/INR No results for input(s): "LABPROT", "INR" in the last 72 hours. CMP     Component Value Date/Time   NA 134 (L) 10/21/2021 0630   K 3.9 10/21/2021 0630   CL 108 10/21/2021 0630   CO2 21 (L) 10/21/2021 0630   GLUCOSE 113 (H) 10/21/2021 0630   BUN 7 (L) 10/21/2021 0630   CREATININE 0.38 (L) 10/21/2021 0630   CALCIUM 8.4 (L) 10/21/2021 0630   PROT 6.1 (L) 10/16/2021 0544   ALBUMIN 3.5 10/16/2021 0544   AST 23 10/16/2021 0544   ALT 11 10/16/2021 0544   ALKPHOS 38 10/16/2021 0544   BILITOT 0.8 10/16/2021 0544   GFRNONAA >60 10/21/2021 0630   Lipase  No results found for: "LIPASE"     Studies/Results: No results found.  Anti-infectives: Anti-infectives (From admission, onward)    Start     Dose/Rate Route Frequency Ordered Stop   10/17/21 0600  cefoTEtan  (CEFOTAN) 2 g in sodium chloride 0.9 % 100 mL IVPB        2 g 200 mL/hr over 30 Minutes Intravenous Every 12 hours 10/16/21 2159 10/17/21 0646   10/16/21 1600  cefoTEtan (CEFOTAN) 2 g in sodium chloride 0.9 % 100 mL IVPB        2 g 200 mL/hr over 30 Minutes Intravenous On call to O.R. 10/16/21 1059 10/16/21 1820        Assessment/Plan Carcinomatosis POD #6 loop ileostomy, g tube, peritoneal biopsy-SG 8/25 -tolerating soft diet and ostomy productive. Keep G tube clamped -PT/mobilize, home health PT ordered -pathology still pending, CEA 2.5 -ok for discharge today from surgical standpoint. I will arrange follow up in our office and send referral to ostomy clinic. I will send rx for pain medication to her pharmacy.   ID - cefotetan periop FEN - IVF, soft, Boost VTE - start lovenox Foley - none   HTN HLD GERD Anxiety Remote h/o breast cancer    LOS: 7 days    Wellington [place], [facility] 10/22/2021, 8:02 AM Please see Amion for pager number during day hours 7:00am-4:30pm

## 2021-10-22 NOTE — Discharge Instructions (Signed)
[facility]  Please arrive at least 30 min before your appointment to complete your check in paperwork.  If you are unable to arrive 30 min prior to your appointment time we may have to cancel or reschedule you. LAPAROSCOPIC SURGERY: POST OP INSTRUCTIONS Always review your discharge instruction sheet given to you by the facility where your surgery was performed. IF YOU HAVE DISABILITY OR FAMILY LEAVE FORMS, YOU MUST BRING THEM TO THE OFFICE FOR PROCESSING.   DO NOT GIVE THEM TO YOUR DOCTOR.  PAIN CONTROL  First take acetaminophen (Tylenol) AND/or ibuprofen (Advil) to control your pain after surgery.  Follow directions on package.  Taking acetaminophen (Tylenol) and/or ibuprofen (Advil) regularly after surgery will help to control your pain and lower the amount of prescription pain medication you may need.  You should not take more than 4,000 mg (4 grams) of acetaminophen (Tylenol) in 24 hours.  You should not take ibuprofen (Advil), aleve, motrin, naprosyn or other NSAIDS if you have a history of stomach ulcers or chronic kidney disease.  A prescription for pain medication may be given to you upon discharge.  Take your pain medication as prescribed, if you still have uncontrolled pain after taking acetaminophen (Tylenol) or ibuprofen (Advil). Use ice packs to help control pain. If you need a refill on your pain medication, please contact your pharmacy.  They will contact our office to request authorization. Prescriptions will not be filled after 5pm or on week-ends.  HOME MEDICATIONS Take your usually prescribed medications unless otherwise directed.  DIET You should follow a light diet the first few days after arrival home.  Be sure to include lots of fluids daily. Avoid fatty, fried foods.   CONSTIPATION It is common to experience some constipation after surgery and if you are taking pain medication.  Increasing fluid intake and taking a stool softener (such as Colace)  will usually help or prevent this problem from occurring.  A mild laxative (Milk of Magnesia or Miralax) should be taken according to package instructions if there are no bowel movements after 48 hours.  WOUND/INCISION CARE Most patients will experience some swelling and bruising in the area of the incisions.  Ice packs will help.  Swelling and bruising can take several days to resolve.  Unless discharge instructions indicate otherwise, follow guidelines below  STERI-STRIPS - you may remove your outer bandages 48 hours after surgery, and you may shower at that time.  You have steri-strips (small skin tapes) in place directly over the incision.  These strips should be left on the skin for 7-10 days.   DERMABOND/SKIN GLUE - you may shower in 24 hours.  The glue will flake off over the next 2-3 weeks. Any sutures or staples will be removed at the office during your follow-up visit.  ACTIVITIES You may resume regular (light) daily activities beginning the next day--such as daily self-care, walking, climbing stairs--gradually increasing activities as tolerated.  You may have sexual intercourse when it is comfortable.  Refrain from any heavy lifting or straining until approved by your doctor. You may drive when you are no longer taking prescription pain medication, you can comfortably wear a seatbelt, and you can safely maneuver your car and apply brakes.  FOLLOW-UP You should see your doctor in the office for a follow-up appointment approximately 2-3 weeks after your surgery.  You should have been given your post-op/follow-up appointment when your surgery was scheduled.  If you did not receive a post-op/follow-up appointment, make sure   that you call for this appointment within a day or two after you arrive home to insure a convenient appointment time.  OTHER INSTRUCTIONS  WHEN TO CALL YOUR DOCTOR: Fever over 101.0 Inability to urinate Continued bleeding from incision. Increased pain, redness, or  drainage from the incision. Increasing abdominal pain  The clinic staff is available to answer your questions during regular business hours.  Please don't hesitate to call and ask to speak to one of the nurses for clinical concerns.  If you have a medical emergency, go to the nearest emergency room or call 911.  A surgeon from [facility] is always on call at the hospital. [address], [address], [place], [place]  [address] ? [address] [address], [place], [place]   [address] [phone] ? [phone] ? FAX [phone]   

## 2021-10-22 NOTE — Care Management Important Message (Signed)
Important Message  Patient Details IM Letter given to the Patient. Name: Nancy Harrison MRN: [id number] Date of Birth: [birth year]   Medicare Important Message Given:  Yes     Kerin Salen 10/22/2021, 10:27 AM

## 2021-10-22 NOTE — Progress Notes (Signed)
Patient discharged to home with husband. Discharge instructions reviewed with patient and family who verbalized understanding. Patient and husband instructed in changing dressing to Peg site and also flushing PEG tube. Both verbalized understanding.

## 2021-10-25 DIAGNOSIS — K219 Gastro-esophageal reflux disease without esophagitis: Secondary | ICD-10-CM | POA: Diagnosis not present

## 2021-10-25 DIAGNOSIS — M1990 Unspecified osteoarthritis, unspecified site: Secondary | ICD-10-CM | POA: Diagnosis not present

## 2021-10-25 DIAGNOSIS — C786 Secondary malignant neoplasm of retroperitoneum and peritoneum: Secondary | ICD-10-CM | POA: Diagnosis not present

## 2021-10-25 DIAGNOSIS — Z483 Aftercare following surgery for neoplasm: Secondary | ICD-10-CM | POA: Diagnosis not present

## 2021-10-25 DIAGNOSIS — E0500 Thyrotoxicosis with diffuse goiter without thyrotoxic crisis or storm: Secondary | ICD-10-CM | POA: Diagnosis not present

## 2021-10-25 DIAGNOSIS — C184 Malignant neoplasm of transverse colon: Secondary | ICD-10-CM | POA: Diagnosis not present

## 2021-10-25 DIAGNOSIS — Z993 Dependence on wheelchair: Secondary | ICD-10-CM | POA: Diagnosis not present

## 2021-10-25 DIAGNOSIS — Z853 Personal history of malignant neoplasm of breast: Secondary | ICD-10-CM | POA: Diagnosis not present

## 2021-10-25 DIAGNOSIS — Z9181 History of falling: Secondary | ICD-10-CM | POA: Diagnosis not present

## 2021-10-25 DIAGNOSIS — Z9221 Personal history of antineoplastic chemotherapy: Secondary | ICD-10-CM | POA: Diagnosis not present

## 2021-10-25 DIAGNOSIS — I10 Essential (primary) hypertension: Secondary | ICD-10-CM | POA: Diagnosis not present

## 2021-10-25 DIAGNOSIS — Z7952 Long term (current) use of systemic steroids: Secondary | ICD-10-CM | POA: Diagnosis not present

## 2021-10-25 DIAGNOSIS — D539 Nutritional anemia, unspecified: Secondary | ICD-10-CM | POA: Diagnosis not present

## 2021-10-25 DIAGNOSIS — H538 Other visual disturbances: Secondary | ICD-10-CM | POA: Diagnosis not present

## 2021-10-25 DIAGNOSIS — Z432 Encounter for attention to ileostomy: Secondary | ICD-10-CM | POA: Diagnosis not present

## 2021-10-25 DIAGNOSIS — Z431 Encounter for attention to gastrostomy: Secondary | ICD-10-CM | POA: Diagnosis not present

## 2021-10-25 DIAGNOSIS — K6389 Other specified diseases of intestine: Secondary | ICD-10-CM | POA: Diagnosis not present

## 2021-10-25 DIAGNOSIS — H269 Unspecified cataract: Secondary | ICD-10-CM | POA: Diagnosis not present

## 2021-10-25 DIAGNOSIS — R69 Illness, unspecified: Secondary | ICD-10-CM | POA: Diagnosis not present

## 2021-10-25 DIAGNOSIS — Z96653 Presence of artificial knee joint, bilateral: Secondary | ICD-10-CM | POA: Diagnosis not present

## 2021-10-25 DIAGNOSIS — E46 Unspecified protein-calorie malnutrition: Secondary | ICD-10-CM | POA: Diagnosis not present

## 2021-10-25 DIAGNOSIS — E785 Hyperlipidemia, unspecified: Secondary | ICD-10-CM | POA: Diagnosis not present

## 2021-10-25 DIAGNOSIS — K2270 Barrett's esophagus without dysplasia: Secondary | ICD-10-CM | POA: Diagnosis not present

## 2021-10-25 DIAGNOSIS — Z87891 Personal history of nicotine dependence: Secondary | ICD-10-CM | POA: Diagnosis not present

## 2021-10-27 ENCOUNTER — Inpatient Hospital Stay: Payer: Medicare HMO | Attending: Oncology | Admitting: Oncology

## 2021-10-27 ENCOUNTER — Other Ambulatory Visit: Payer: Self-pay | Admitting: *Deleted

## 2021-10-27 ENCOUNTER — Encounter: Payer: Self-pay | Admitting: *Deleted

## 2021-10-27 DIAGNOSIS — Z5189 Encounter for other specified aftercare: Secondary | ICD-10-CM | POA: Diagnosis not present

## 2021-10-27 DIAGNOSIS — K56609 Unspecified intestinal obstruction, unspecified as to partial versus complete obstruction: Secondary | ICD-10-CM | POA: Insufficient documentation

## 2021-10-27 DIAGNOSIS — R799 Abnormal finding of blood chemistry, unspecified: Secondary | ICD-10-CM | POA: Insufficient documentation

## 2021-10-27 DIAGNOSIS — Z800 Family history of malignant neoplasm of digestive organs: Secondary | ICD-10-CM | POA: Diagnosis not present

## 2021-10-27 DIAGNOSIS — K2270 Barrett's esophagus without dysplasia: Secondary | ICD-10-CM | POA: Insufficient documentation

## 2021-10-27 DIAGNOSIS — Z931 Gastrostomy status: Secondary | ICD-10-CM | POA: Diagnosis not present

## 2021-10-27 DIAGNOSIS — R112 Nausea with vomiting, unspecified: Secondary | ICD-10-CM | POA: Insufficient documentation

## 2021-10-27 DIAGNOSIS — R971 Elevated cancer antigen 125 [CA 125]: Secondary | ICD-10-CM | POA: Insufficient documentation

## 2021-10-27 DIAGNOSIS — Z853 Personal history of malignant neoplasm of breast: Secondary | ICD-10-CM | POA: Insufficient documentation

## 2021-10-27 DIAGNOSIS — Z5111 Encounter for antineoplastic chemotherapy: Secondary | ICD-10-CM | POA: Diagnosis not present

## 2021-10-27 DIAGNOSIS — I10 Essential (primary) hypertension: Secondary | ICD-10-CM | POA: Diagnosis not present

## 2021-10-27 DIAGNOSIS — E039 Hypothyroidism, unspecified: Secondary | ICD-10-CM | POA: Diagnosis not present

## 2021-10-27 DIAGNOSIS — C786 Secondary malignant neoplasm of retroperitoneum and peritoneum: Secondary | ICD-10-CM

## 2021-10-27 DIAGNOSIS — C579 Malignant neoplasm of female genital organ, unspecified: Secondary | ICD-10-CM | POA: Diagnosis not present

## 2021-10-27 DIAGNOSIS — E860 Dehydration: Secondary | ICD-10-CM | POA: Diagnosis not present

## 2021-10-27 DIAGNOSIS — I959 Hypotension, unspecified: Secondary | ICD-10-CM | POA: Insufficient documentation

## 2021-10-27 DIAGNOSIS — R918 Other nonspecific abnormal finding of lung field: Secondary | ICD-10-CM | POA: Insufficient documentation

## 2021-10-27 DIAGNOSIS — R531 Weakness: Secondary | ICD-10-CM | POA: Insufficient documentation

## 2021-10-27 DIAGNOSIS — Z801 Family history of malignant neoplasm of trachea, bronchus and lung: Secondary | ICD-10-CM | POA: Diagnosis not present

## 2021-10-27 DIAGNOSIS — M899 Disorder of bone, unspecified: Secondary | ICD-10-CM | POA: Insufficient documentation

## 2021-10-27 NOTE — Progress Notes (Signed)
Referral for genetics

## 2021-10-27 NOTE — Progress Notes (Signed)
[facility] OFFICE PROGRESS NOTE   Diagnosis: Peritoneal carcinomatosis  INTERVAL HISTORY:   Nancy Harrison was admitted with evidence of colonic obstruction on 10/15/2021.  Dr. Johney Maine was consulted and she was taken to the operating room for a laparoscopic diverting loop ileostomy, gastrostomy tube placement, and peritoneal biopsy on 10/16/2021.  There was evidence of diffuse carcinomatosis on the parietal and visceral peritoneum and in the pelvis.  There is a bulky fibrotic mass at the distal transverse colon.  Bilateral adnexa and uterus with no mass or nodularity.  No liver masses.  The appendix appeared normal.  She was discharged to home 10/22/2021.  She denies pain and nausea.  She is tolerating a diet.  She has not use the venting gastrostomy tube.  The abdomen is mildly distended.    Objective:  Vital signs in last 24 hours:  There were no vitals taken for this visit.   Resp: Lungs clear bilaterally Cardio: Regular rate and rhythm GI: Mildly distended, soft, left upper quadrant gastrostomy tube site without evidence of infection.  Right abdomen ileostomy Vascular: No leg edema   Lab Results:  Lab Results  Component Value Date   WBC 6.5 10/21/2021   HGB 12.0 10/21/2021   HCT 36.8 10/21/2021   MCV 104.2 (H) 10/21/2021   PLT 367 10/21/2021   NEUTROABS 1.9 04/09/2011    CMP  Lab Results  Component Value Date   NA 134 (L) 10/21/2021   K 3.9 10/21/2021   CL 108 10/21/2021   CO2 21 (L) 10/21/2021   GLUCOSE 113 (H) 10/21/2021   BUN 7 (L) 10/21/2021   CREATININE 0.38 (L) 10/21/2021   CALCIUM 8.4 (L) 10/21/2021   PROT 6.1 (L) 10/16/2021   ALBUMIN 3.5 10/16/2021   AST 23 10/16/2021   ALT 11 10/16/2021   ALKPHOS 38 10/16/2021   BILITOT 0.8 10/16/2021   GFRNONAA >60 10/21/2021    Lab Results  Component Value Date   CEA1 2.5 10/18/2021   Medications: I have reviewed the patient's current medications.   Assessment/Plan: Abdominal carcinomatosis  resulting in colonic obstruction CT abdomen/pelvis 10/09/2021-irregular mass in the distal transverse colon with dilation of the transverse and right colon, and distal small bowel.  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  Bone lesions concerning for metastases, right lung nodule Laparoscopy, diverting loop ileostomy, gastrostomy tube placement, peritoneal biopsy 10/16/2021, no evidence of a primary tumor site at the appendix, ovaries, or uterus.  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  CDX2, CK20, and GATA3 negative.  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   Abdominal distention/pain and diarrhea secondary to #1 3.   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  Lumpectomy and adjuvant CMF x8 followed by radiation 4. Hypertension 5. Hypothyroidism 6. Barrett's esophagus 7. Family history of multiple cancers including appendix, colon, and lung cancer   Disposition: Nancy. Nancy Harrison underwent a diverting loop ileostomy, gastrostomy tube placement, and peritoneal biopsy on 10/16/2021.  The pathology revealed a poorly differentiated carcinoma favoring a high-grade serous carcinoma of gynecologic primary versus primary peritoneal serous carcinoma.  Markers for colorectal and breast cancer were negative.  However no primary tumor site was seen involving the uterus or ovaries.  She may have primary peritoneal carcinoma.  She will return for a CA125 tomorrow.  We will make referral to gynecologic oncology.  She will be referred to the genetics counselor.  It would be unusual for her to present with bone metastases from a gynecologic or primary peritoneal site, but this is possible.  This would also be  an unusual presentation for colorectal cancer given the negative colonoscopy in 2018.  Her case will be presented at the GI tumor conference.  I will recommend Taxol/carboplatin, potentially to be followed by cytoreductive surgery if peritoneal carcinoma is confirmed.  We reviewed potential toxicities associated with the Taxol/carboplatin regimen including the chance of hematologic toxicity, infection, bleeding, and nausea/vomiting.  We discussed the allergic reaction, bone pain, and neuropathy seen with paclitaxel.  We discussed the allergic reaction associated with carboplatin.  We reviewed the probable need for G-CSF support.  We discussed the bone pain, rash, and splenic rupture associated with G-CSF.  She will attend a chemotherapy teaching class.  She is scheduled to see Dr. Johney Maine in follow-up next week.  We will make referral to Dr. Berline Lopes.  Nancy Harrison will return for an office visit and further discussion on 11/10/2021.  [name], MD  10/27/2021  5:24 PM

## 2021-10-27 NOTE — Progress Notes (Signed)
PATIENT NAVIGATOR PROGRESS NOTE  Name: Kortney Potvin Date: 10/27/2021 MRN: [id number]  DOB: [birth year]   Reason for visit:  F/U visit with Dr Benay Spice  Comments:   Met with Mr and Mrs Fitting during visit Enrolled in [facility] but have not received call, will reach out to [facility] and confirm  CA 125 ordered and will be drawn tomorrow ER and PR and Her 2 testing requested through pathology Accession number [id number] Referral placed to Gyn onc surgery Will F/U with pt regarding testing and appts    Time spent counseling/coordinating care: > 60 minutes

## 2021-10-28 ENCOUNTER — Inpatient Hospital Stay: Payer: Medicare HMO

## 2021-10-28 DIAGNOSIS — R799 Abnormal finding of blood chemistry, unspecified: Secondary | ICD-10-CM | POA: Diagnosis not present

## 2021-10-28 DIAGNOSIS — E785 Hyperlipidemia, unspecified: Secondary | ICD-10-CM | POA: Diagnosis not present

## 2021-10-28 DIAGNOSIS — C184 Malignant neoplasm of transverse colon: Secondary | ICD-10-CM | POA: Diagnosis not present

## 2021-10-28 DIAGNOSIS — C786 Secondary malignant neoplasm of retroperitoneum and peritoneum: Secondary | ICD-10-CM

## 2021-10-28 DIAGNOSIS — Z801 Family history of malignant neoplasm of trachea, bronchus and lung: Secondary | ICD-10-CM | POA: Diagnosis not present

## 2021-10-28 DIAGNOSIS — Z993 Dependence on wheelchair: Secondary | ICD-10-CM | POA: Diagnosis not present

## 2021-10-28 DIAGNOSIS — C579 Malignant neoplasm of female genital organ, unspecified: Secondary | ICD-10-CM | POA: Diagnosis not present

## 2021-10-28 DIAGNOSIS — R112 Nausea with vomiting, unspecified: Secondary | ICD-10-CM | POA: Diagnosis not present

## 2021-10-28 DIAGNOSIS — Z800 Family history of malignant neoplasm of digestive organs: Secondary | ICD-10-CM | POA: Diagnosis not present

## 2021-10-28 DIAGNOSIS — H269 Unspecified cataract: Secondary | ICD-10-CM | POA: Diagnosis not present

## 2021-10-28 DIAGNOSIS — Z9181 History of falling: Secondary | ICD-10-CM | POA: Diagnosis not present

## 2021-10-28 DIAGNOSIS — M1990 Unspecified osteoarthritis, unspecified site: Secondary | ICD-10-CM | POA: Diagnosis not present

## 2021-10-28 DIAGNOSIS — E860 Dehydration: Secondary | ICD-10-CM | POA: Diagnosis not present

## 2021-10-28 DIAGNOSIS — Z87891 Personal history of nicotine dependence: Secondary | ICD-10-CM | POA: Diagnosis not present

## 2021-10-28 DIAGNOSIS — H538 Other visual disturbances: Secondary | ICD-10-CM | POA: Diagnosis not present

## 2021-10-28 DIAGNOSIS — R531 Weakness: Secondary | ICD-10-CM | POA: Diagnosis not present

## 2021-10-28 DIAGNOSIS — R971 Elevated cancer antigen 125 [CA 125]: Secondary | ICD-10-CM | POA: Diagnosis not present

## 2021-10-28 DIAGNOSIS — K2270 Barrett's esophagus without dysplasia: Secondary | ICD-10-CM | POA: Diagnosis not present

## 2021-10-28 DIAGNOSIS — Z431 Encounter for attention to gastrostomy: Secondary | ICD-10-CM | POA: Diagnosis not present

## 2021-10-28 DIAGNOSIS — K56609 Unspecified intestinal obstruction, unspecified as to partial versus complete obstruction: Secondary | ICD-10-CM | POA: Diagnosis not present

## 2021-10-28 DIAGNOSIS — R69 Illness, unspecified: Secondary | ICD-10-CM | POA: Diagnosis not present

## 2021-10-28 DIAGNOSIS — I959 Hypotension, unspecified: Secondary | ICD-10-CM | POA: Diagnosis not present

## 2021-10-28 DIAGNOSIS — R918 Other nonspecific abnormal finding of lung field: Secondary | ICD-10-CM | POA: Diagnosis not present

## 2021-10-28 DIAGNOSIS — Z5189 Encounter for other specified aftercare: Secondary | ICD-10-CM | POA: Diagnosis not present

## 2021-10-28 DIAGNOSIS — Z931 Gastrostomy status: Secondary | ICD-10-CM | POA: Diagnosis not present

## 2021-10-28 DIAGNOSIS — E039 Hypothyroidism, unspecified: Secondary | ICD-10-CM | POA: Diagnosis not present

## 2021-10-28 DIAGNOSIS — Z96653 Presence of artificial knee joint, bilateral: Secondary | ICD-10-CM | POA: Diagnosis not present

## 2021-10-28 DIAGNOSIS — Z483 Aftercare following surgery for neoplasm: Secondary | ICD-10-CM | POA: Diagnosis not present

## 2021-10-28 DIAGNOSIS — Z5111 Encounter for antineoplastic chemotherapy: Secondary | ICD-10-CM | POA: Diagnosis not present

## 2021-10-28 DIAGNOSIS — K6389 Other specified diseases of intestine: Secondary | ICD-10-CM | POA: Diagnosis not present

## 2021-10-28 DIAGNOSIS — M899 Disorder of bone, unspecified: Secondary | ICD-10-CM | POA: Diagnosis not present

## 2021-10-28 DIAGNOSIS — I10 Essential (primary) hypertension: Secondary | ICD-10-CM | POA: Diagnosis not present

## 2021-10-28 DIAGNOSIS — Z432 Encounter for attention to ileostomy: Secondary | ICD-10-CM | POA: Diagnosis not present

## 2021-10-28 DIAGNOSIS — Z853 Personal history of malignant neoplasm of breast: Secondary | ICD-10-CM | POA: Diagnosis not present

## 2021-10-29 ENCOUNTER — Encounter: Payer: Self-pay | Admitting: *Deleted

## 2021-10-29 ENCOUNTER — Telehealth: Payer: Self-pay | Admitting: *Deleted

## 2021-10-29 LAB — CA 125: Cancer Antigen (CA) 125: 818 U/mL — ABNORMAL HIGH (ref 0.0–38.1)

## 2021-10-29 NOTE — Telephone Encounter (Signed)
Spoke with the patient regarding the referral to GYN oncology. Patient scheduled as new patient with Dr Berline Lopes on 9/11 at 9:45 am. Patient given an arrival time of 9:15 am.  Explained to the patient the the doctor will perform a pelvic exam at this visit. Patient given the policy that no visitors under the 16 yrs are allowed in the [place]. Patient given the address/phone number for the clinic and that the center offers free valet service.

## 2021-10-29 NOTE — Progress Notes (Addendum)
PATIENT NAVIGATOR PROGRESS NOTE  Name: Nancy Harrison Date: 10/29/2021 MRN: [id number]  DOB: 1940-05-31   Reason for visit:  F/U phone call  Comments:  Spoke with Nancy Harrison this am to review CA 125 results of 818.  Proceeding with plan for consult with Gyn Surgeon Dr Berline Lopes, she is seeing her on Monday, 9/11  Discussed ostomy supplies and that she needs a high volume output bag for overnight.  [employer] and spoke with rep [employer] and she is calling Nancy Harrison to discuss needs and assist in ordering.  Supplies from [employer] were shipped yesterday and should arrive tomorrow.  Nancy Harrison will further assist her with ordering from provider [place] at [phone]      Time spent counseling/coordinating care: 45-60 minutes

## 2021-10-30 ENCOUNTER — Ambulatory Visit (HOSPITAL_COMMUNITY)
Admission: RE | Admit: 2021-10-30 | Discharge: 2021-10-30 | Disposition: A | Discharge status: Home / Self Care | Payer: Medicare HMO | Source: Ambulatory Visit | Attending: Nurse Practitioner | Admitting: Nurse Practitioner

## 2021-10-30 ENCOUNTER — Encounter: Payer: Self-pay | Admitting: Gynecologic Oncology

## 2021-10-30 ENCOUNTER — Other Ambulatory Visit (HOSPITAL_COMMUNITY): Payer: Self-pay | Admitting: Nurse Practitioner

## 2021-10-30 DIAGNOSIS — Z85038 Personal history of other malignant neoplasm of large intestine: Secondary | ICD-10-CM | POA: Insufficient documentation

## 2021-10-30 DIAGNOSIS — L24B1 Irritant contact dermatitis related to digestive stoma or fistula: Secondary | ICD-10-CM

## 2021-10-30 DIAGNOSIS — K9423 Gastrostomy malfunction: Secondary | ICD-10-CM

## 2021-10-30 DIAGNOSIS — Z432 Encounter for attention to ileostomy: Secondary | ICD-10-CM

## 2021-10-30 DIAGNOSIS — L24B3 Irritant contact dermatitis related to fecal or urinary stoma or fistula: Secondary | ICD-10-CM | POA: Diagnosis not present

## 2021-10-30 DIAGNOSIS — Z932 Ileostomy status: Secondary | ICD-10-CM | POA: Diagnosis not present

## 2021-10-30 LAB — SURGICAL PATHOLOGY

## 2021-10-30 NOTE — Discharge Instructions (Signed)
Cleanse skin with soap and water Stoma powder Seal with skin prep Apply barrier ring Cut pouch to fit - was 1 1/2 inches today Empty when 1/3 full

## 2021-10-30 NOTE — Progress Notes (Signed)
[facility]   Reason for visit:  RLQ ileostomy  G tube placed, currently only being flushed in the evening time.  Serosanguinous fluid noted in G tube line.  No nutrition or meds being administered.  HPI:  Colonic malignancy  ROS  Review of Systems  Gastrointestinal:        Ileostomy- high output at night  All other systems reviewed and are negative.  Vital signs:  BP 117/65   Pulse 86   Temp (!) 97.5 F (36.4 C) (Oral)   Resp 18   SpO2 99%  Exam:  Physical Exam Vitals reviewed.  Constitutional:      Appearance: Normal appearance.  Abdominal:     Palpations: Abdomen is soft.     Comments: LUQ G tube in place RUQ ileostomy  Skin:    General: Skin is warm and dry.     Findings: Erythema and rash present.  Neurological:     Mental Status: She is alert and oriented to person, place, and time.  Psychiatric:        Mood and Affect: Mood normal.        Behavior: Behavior normal.     Stoma type/location:  RLQ colostomy Stomal assessment/size:  1 1/2" budded stoma Peristomal assessment:  redness Will protect stoma powder and skin prep  apply barrier ring Treatment options for stomal/peristomal skin: powder/skin prep and barrier ring Output: soft brown stool Ostomy pouching: 1pc convex  Education provided:  Empty when 1/3 full.  Reports a lot of extra output at night. Is also flushing G tube at night.  Encouraged to flush earlier in the day.     Impression/dx  Ileostomy Contact dermatitis Discussion  Will set up with supply company for supplies.  Plan  See back as needed.     Visit time: 55 minutes.   Domenic Moras FNP-BC

## 2021-11-02 ENCOUNTER — Encounter: Payer: Self-pay | Admitting: Gynecologic Oncology

## 2021-11-02 ENCOUNTER — Inpatient Hospital Stay (HOSPITAL_BASED_OUTPATIENT_CLINIC_OR_DEPARTMENT_OTHER): Payer: Medicare HMO | Admitting: Gynecologic Oncology

## 2021-11-02 ENCOUNTER — Other Ambulatory Visit: Payer: Self-pay

## 2021-11-02 ENCOUNTER — Ambulatory Visit: Payer: Medicare Other

## 2021-11-02 VITALS — BP 98/60 | HR 75 | Temp 97.5°F | Resp 18 | Ht 59.02 in | Wt 118.2 lb

## 2021-11-02 DIAGNOSIS — K2270 Barrett's esophagus without dysplasia: Secondary | ICD-10-CM | POA: Diagnosis not present

## 2021-11-02 DIAGNOSIS — R971 Elevated cancer antigen 125 [CA 125]: Secondary | ICD-10-CM | POA: Diagnosis not present

## 2021-11-02 DIAGNOSIS — R112 Nausea with vomiting, unspecified: Secondary | ICD-10-CM | POA: Diagnosis not present

## 2021-11-02 DIAGNOSIS — R937 Abnormal findings on diagnostic imaging of other parts of musculoskeletal system: Secondary | ICD-10-CM

## 2021-11-02 DIAGNOSIS — L24B3 Irritant contact dermatitis related to fecal or urinary stoma or fistula: Secondary | ICD-10-CM | POA: Insufficient documentation

## 2021-11-02 DIAGNOSIS — Z8379 Family history of other diseases of the digestive system: Secondary | ICD-10-CM | POA: Diagnosis not present

## 2021-11-02 DIAGNOSIS — I959 Hypotension, unspecified: Secondary | ICD-10-CM | POA: Diagnosis not present

## 2021-11-02 DIAGNOSIS — M899 Disorder of bone, unspecified: Secondary | ICD-10-CM | POA: Diagnosis not present

## 2021-11-02 DIAGNOSIS — E039 Hypothyroidism, unspecified: Secondary | ICD-10-CM | POA: Diagnosis not present

## 2021-11-02 DIAGNOSIS — Z803 Family history of malignant neoplasm of breast: Secondary | ICD-10-CM

## 2021-11-02 DIAGNOSIS — Z801 Family history of malignant neoplasm of trachea, bronchus and lung: Secondary | ICD-10-CM | POA: Diagnosis not present

## 2021-11-02 DIAGNOSIS — Z800 Family history of malignant neoplasm of digestive organs: Secondary | ICD-10-CM | POA: Diagnosis not present

## 2021-11-02 DIAGNOSIS — C786 Secondary malignant neoplasm of retroperitoneum and peritoneum: Secondary | ICD-10-CM

## 2021-11-02 DIAGNOSIS — I10 Essential (primary) hypertension: Secondary | ICD-10-CM | POA: Diagnosis not present

## 2021-11-02 DIAGNOSIS — C579 Malignant neoplasm of female genital organ, unspecified: Secondary | ICD-10-CM

## 2021-11-02 DIAGNOSIS — Z853 Personal history of malignant neoplasm of breast: Secondary | ICD-10-CM | POA: Diagnosis not present

## 2021-11-02 DIAGNOSIS — R911 Solitary pulmonary nodule: Secondary | ICD-10-CM | POA: Diagnosis not present

## 2021-11-02 DIAGNOSIS — K56609 Unspecified intestinal obstruction, unspecified as to partial versus complete obstruction: Secondary | ICD-10-CM | POA: Diagnosis not present

## 2021-11-02 DIAGNOSIS — Z931 Gastrostomy status: Secondary | ICD-10-CM | POA: Diagnosis not present

## 2021-11-02 DIAGNOSIS — R918 Other nonspecific abnormal finding of lung field: Secondary | ICD-10-CM | POA: Diagnosis not present

## 2021-11-02 DIAGNOSIS — R799 Abnormal finding of blood chemistry, unspecified: Secondary | ICD-10-CM | POA: Diagnosis not present

## 2021-11-02 DIAGNOSIS — K6389 Other specified diseases of intestine: Secondary | ICD-10-CM

## 2021-11-02 DIAGNOSIS — Z5111 Encounter for antineoplastic chemotherapy: Secondary | ICD-10-CM | POA: Diagnosis not present

## 2021-11-02 DIAGNOSIS — K9423 Gastrostomy malfunction: Secondary | ICD-10-CM | POA: Insufficient documentation

## 2021-11-02 DIAGNOSIS — C801 Malignant (primary) neoplasm, unspecified: Secondary | ICD-10-CM | POA: Diagnosis not present

## 2021-11-02 DIAGNOSIS — E860 Dehydration: Secondary | ICD-10-CM | POA: Diagnosis not present

## 2021-11-02 DIAGNOSIS — Z5189 Encounter for other specified aftercare: Secondary | ICD-10-CM | POA: Diagnosis not present

## 2021-11-02 DIAGNOSIS — R531 Weakness: Secondary | ICD-10-CM | POA: Diagnosis not present

## 2021-11-02 DIAGNOSIS — L24B1 Irritant contact dermatitis related to digestive stoma or fistula: Secondary | ICD-10-CM | POA: Insufficient documentation

## 2021-11-02 NOTE — Progress Notes (Unsigned)
GYNECOLOGIC ONCOLOGY NEW PATIENT CONSULTATION   Patient Name: Nancy Harrison  Patient Age: 81 y.o. 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   I discussed in detail with the patient and her husband events during her recent hospital admission and findings at the time of surgery. While imaging findings as well as intra-operative findings would favor a GI primary, tissue biopsy is c/w high grade serous carcinoma of gynecologic origin. Further, her CEA is normal and CA-125 is elevated. We discussed lower pulmonary nodule concerning for metastatic disease as well as suspicion for bony metastases. I have recommended completion imaging with a chest CT and a bone scan to further evaluate bony lesions. It is uncommon for ovarian cancer to metastasize to the bones, although not impossible. Some literature supports that bony metastases at the time of diagnosis portends a poor prognosis. If bone scan confirms bony metastases, I would recommend zoledronic acid to decrease the risk of skeletal related events.   The patient sees Dr. Johney Maine tomorrow. She was discharged without a bag for her PEG tube and has not used it for venting. She has had some nausea and several episodes of emesis. I encouraged her to ask about a bag for her PEG tube so that she can decompress her stomach if she begins having nausea/emesis.   I discussed that I believe she likely has stage IVB primary peritoneal cancer. I discussed that the treatment approach for this disease is typically combination of cytoreductive surgery and chemotherapy. I discussed that sequencing of this can be either with upfront debulking followed by adjuvant chemotherapy sequentially or neoadjuvant  chemotherapy followed by an interval cytoreductive attempt, then additional chemotherapy. This latter approach is associated with a reduced perioperative morbidity at the time of surgery.  I discussed that decisions regarding sequencing of therapy is individualized taking into account individual patient health, in addition to the apparent tumor distribution on imaging, and likelihood of complete surgical resection at the time of surgery. I discussed that the overall survival observed in patients is equivalent for both approaches (neoadjuvant chemotherapy versus primary debulking surgery) provided that there is an optimal cytoreductive effort at the time of surgery (regardless of the timing of that surgery). Given the extent of disease and inability to achieve complete cytoreduction at this time, I recommend that we start with neoadjuvant chemotherapy. We discussed plan for 3-4 cycles of NACT followed by interval imaging.   The patient's daughter has a history of rare GI tumor (I am told this is appendiceal-like but not a cancer of the appendix that took extensive testing to diagnosis). I have asked her to obtain these records from her daughter (any pathology reports as well as genetic testing that was done). I spoke with the pathologist who reviewed her peritoneal biopsy. IHC strongly supports that this is not a colon cancer - additional testing could be added to the tissue depending on daughter's diagnosis. We also discussed the utility of [employer] testing. I have placed a referral to genetics for both genetic and somatic testing given that patient's recent diagnosis as well as her family history.   I reviewed that port would be placed for chemotherapy administration. She had a port previously at the time of chemotherapy administration for her breast cancer. I will place an order for IR  port placement.   A copy of this note was sent to the patient's referring provider.   65 minutes of total time was  spent for this patient encounter, including preparation, face-to-face counseling with the patient and coordination of care, and documentation of the encounter.   Jeral Pinch, MD  Division of Gynecologic Oncology  Department of Obstetrics and Gynecology  [facility]  ___________________________________________  Chief Complaint: No chief complaint on file.   History of Present Illness:  Nancy Harrison is a 81 y.o. y.o. female who is seen in consultation at the request of Dr. Benay Spice for an evaluation of metastatic carcinoma, likely gynecologic.  The patient initially presented with multiple weeks of diarrhea and abdominal pain. She was seen by Dr. Benay Spice in mid-August after CT A/P was ordered by her primary care. Given CT scan concerning for primary colon cancer with evidence of colonic obstruction, hospital admission was recommended.  On 10/16/2021, the patient was taken to the OR by Dr. Johney Maine with findings of diffuse carcinomatosis.  There was a white fibrotic tumor at the distal transverse colon.  Bilateral adnexa and uterus were normal in appearance.  Gallbladder and appendix without obvious tumor although notable for involvement by carcinomatosis.  Stomach had tumor implants along the lesser sac and greater omentum but not on the stomach itself.  Given intraoperative findings, diverting loop ileostomy and laparoscopic G-tube placement were performed and biopsy of the tumor was taken.  It was ultimately discharged on 8/31.  Pathology revealed high-grade carcinoma favoring serous carcinoma of gynecologic origin by IHC.  CK7, PAX8, WT1, and p53 all positive with focal p16 positivity.  CDX2, CK20, and GATA3 negative.  Tumor markers are as follows: CEA: 2.5 CA-125: 818  Patient reports having diarrhea every 3 or 4 days for about 10 weeks prior to her clinic visit and subsequent hospitalization.  She would have occasional abdominal pain during this time.  She  lost about 10 pounds and then has lost an additional 5 pounds since she was hospitalized.  Her appetite had decreased in the weeks leading up to her hospitalization.  Since her discharge, she reports her appetite is mildly improved.  She has had 2 episodes of emesis since surgery that happened in the middle of the night.  Has had some intermittent nausea.  Was not discharged with a bag for her PEG tube, has not used it at all for decompression.  Sees Dr. Johney Maine for follow-up tomorrow.  Her ostomy is functioning well, she is still learning how frequently she needs to change the bag and how much output to expect overnight.  She denies any significant abdominal or pelvic pain, notes just small twinges from time to time.  She had a colonoscopy on 10/06/2016.  A polyp was removed from the sigmoid colon.  The pathology revealed a hyperplastic polyp.  PAST MEDICAL HISTORY:  Past Medical History:  Diagnosis Date   Arthritis    Barrett's esophagus    Path 10/2001   Breast cancer ([place]) 01/01/2011   Cancer ([place]) 1999   rt breast   Cataract    Hypertension    SUI (stress urinary incontinence, female)    Thyroid disease    Urinary urgency      PAST SURGICAL HISTORY:  Past Surgical History:  Procedure Laterality Date   BREAST LUMPECTOMY  1999   Chemo and radiation on right breast   LAPAROSCOPY N/A 10/16/2021   Procedure: LAPAROSCOPIC  DIVERTING OSTOMY, BIOSPY,  G-TUBE PLACEMENT;  Surgeon:  Michael Boston, MD;  Location: [facility];  Service: General;  Laterality: N/A;   [place] 2001    OB/GYN HISTORY:  OB History  Gravida Para Term Preterm AB Living  3 3          SAB IAB Ectopic Multiple Live Births               # Outcome Date GA Lbr Len/2nd Weight Sex Delivery Anes PTL Lv  3 Para           2 Para           1 Para             No LMP recorded. Patient is postmenopausal.  Age at menarche: 50 Age at menopause: 38 Hx of HRT: Denies Hx of STDs:  Denies Last pap: Unsure, number of years ago History of abnormal pap smears: Denies  SCREENING STUDIES:  Last mammogram: 2022  Last colonoscopy: 2018  MEDICATIONS: Outpatient Encounter Medications as of 11/02/2021  Medication Sig   acetaminophen (TYLENOL) 325 MG tablet Take 2 tablets (650 mg total) by mouth every 6 (six) hours as needed for mild pain or fever.   bisoprolol-hydrochlorothiazide (ZIAC) 10-6.25 MG per tablet Take 1 tablet by mouth daily.     lisinopril (PRINIVIL,ZESTRIL) 10 MG tablet Take 10 mg by mouth daily.     loratadine (CLARITIN) 10 MG tablet Take 10 mg by mouth See admin instructions. Take 10 mg by mouth one to two times a day as needed for allergies   methocarbamol (ROBAXIN) 500 MG tablet Take 1 tablet (500 mg total) by mouth every 6 (six) hours as needed for muscle spasms.   Multiple Vitamin (MULTIVITAMIN PO) Take 1 tablet by mouth daily with breakfast.   ibuprofen (ADVIL,MOTRIN) 200 MG tablet Take 400-600 mg by mouth daily as needed for mild pain. Aches and pain (Patient not taking: Reported on 10/27/2021)   loperamide (IMODIUM A-D) 2 MG tablet Take 2-4 mg by mouth 4 (four) times daily as needed for diarrhea or loose stools. (Patient not taking: Reported on 10/27/2021)   Facility-Administered Encounter Medications as of 11/02/2021  Medication   0.9 %  sodium chloride infusion    ALLERGIES:  Allergies  Allergen Reactions   Sporanox [Itraconazole] Rash     FAMILY HISTORY:  Family History  Problem Relation Age of Onset   Cancer Mother        COLON   Colon cancer Mother 25   Cancer Sister        breast   Breast cancer Sister    Cancer Maternal Aunt        breast   Colon cancer Maternal Aunt    Cancer Daughter        appendiceal   Stomach cancer Neg Hx    Rectal cancer Neg Hx    Esophageal cancer Neg Hx      SOCIAL HISTORY:  Social Connections: Not on file    REVIEW OF SYSTEMS:  + Weight loss, cough, shortness of breath, diarrhea Denies appetite  changes, fevers, chills, fatigue. Denies hearing loss, neck lumps or masses, mouth sores, ringing in ears or voice changes. Denies wheezing.   Denies chest pain or palpitations. Denies leg swelling. Denies abdominal distention, pain, blood in stools, constipation, nausea, vomiting, or early satiety. Denies pain with intercourse, dysuria, frequency, hematuria or incontinence. Denies hot flashes, pelvic pain, vaginal bleeding or vaginal discharge.   Denies joint pain,  back pain or muscle pain/cramps. Denies itching, rash, or wounds. Denies dizziness, headaches, numbness or seizures. Denies swollen lymph nodes or glands, denies easy bruising or bleeding. Denies anxiety, depression, confusion, or decreased concentration.  Physical Exam:  Vital Signs for this encounter:  Blood pressure 98/60, pulse 75, temperature (!) 97.5 F (36.4 C), temperature source Oral, resp. rate 18, height 4' 11.02" (1.499 m), weight 118 lb 3.2 oz (53.6 kg), SpO2 100 %. Body mass index is 23.86 kg/m. General: Alert, oriented, no acute distress.  HEENT: Normocephalic, atraumatic. Sclera anicteric.  Chest: Clear to auscultation bilaterally. No wheezes, rhonchi, or rales. Cardiovascular: Regular rate and rhythm, no murmurs, rubs, or gallops.  Abdomen: Normoactive bowel sounds. Soft, nondistended, nontender to palpation.  Loop ostomy is pink and viable, output noted in the bag.  PEG tube in place with serous fluid in the tubing, tubing capped.  Fullness appreciated on palpation in the left upper quadrant.  No palpable fluid wave.  Extremities: Grossly normal range of motion. Warm, well perfused. No edema bilaterally.  Skin: No rashes or lesions.  Lymphatics: No cervical, supraclavicular, or inguinal adenopathy.  GU:  Normal external female genitalia. No lesions. No discharge or bleeding.             Bladder/urethra:  No lesions or masses, well supported bladder             Vagina: Atrophic vaginal mucosa, no lesions or  masses noted.             Cervix: Normal appearing, no lesions.  Atrophic appearing.             Uterus: Small, mobile, no parametrial involvement or nodularity.             Adnexa: No masses appreciated.  Rectal: Rectum is free, no nodularity.  LABORATORY AND RADIOLOGIC DATA:  Outside medical records were reviewed to synthesize the above history, along with the history and physical obtained during the visit.   Lab Results  Component Value Date   WBC 6.5 10/21/2021   HGB 12.0 10/21/2021   HCT 36.8 10/21/2021   PLT 367 10/21/2021   GLUCOSE 113 (H) 10/21/2021   ALT 11 10/16/2021   AST 23 10/16/2021   NA 134 (L) 10/21/2021   K 3.9 10/21/2021   CL 108 10/21/2021   CREATININE 0.38 (L) 10/21/2021   BUN 7 (L) 10/21/2021   CO2 21 (L) 10/21/2021

## 2021-11-02 NOTE — Patient Instructions (Addendum)
It was very nice to meet you.  Today we discussed getting a CT scan of your chest as well as a bone scan to better evaluate the possible metastatic spots seen on CT scan.  I will call you with these results.  If you are able to get your daughter's pathology and any testing, such as genetic testing, that she had, that would be very helpful.  I will discuss with Dr. Gearldine Shown team working on getting you a port so that we can get started with chemotherapy.  Please speak with Dr. Johney Maine at your appointment tomorrow about getting a bag for your G-tube.

## 2021-11-03 ENCOUNTER — Encounter (HOSPITAL_COMMUNITY): Payer: Self-pay

## 2021-11-03 ENCOUNTER — Encounter: Payer: Self-pay | Admitting: Gynecologic Oncology

## 2021-11-03 ENCOUNTER — Inpatient Hospital Stay (HOSPITAL_COMMUNITY)
Admission: RE | Admit: 2021-11-03 | Discharge: 2021-11-03 | Disposition: A | Discharge status: Home / Self Care | Payer: Medicare HMO | Source: Ambulatory Visit | Attending: Gynecologic Oncology | Admitting: Gynecologic Oncology

## 2021-11-03 DIAGNOSIS — C786 Secondary malignant neoplasm of retroperitoneum and peritoneum: Secondary | ICD-10-CM | POA: Insufficient documentation

## 2021-11-03 DIAGNOSIS — N179 Acute kidney failure, unspecified: Secondary | ICD-10-CM | POA: Diagnosis not present

## 2021-11-03 DIAGNOSIS — R112 Nausea with vomiting, unspecified: Secondary | ICD-10-CM | POA: Diagnosis not present

## 2021-11-03 DIAGNOSIS — C801 Malignant (primary) neoplasm, unspecified: Secondary | ICD-10-CM | POA: Diagnosis not present

## 2021-11-03 DIAGNOSIS — R911 Solitary pulmonary nodule: Secondary | ICD-10-CM | POA: Diagnosis not present

## 2021-11-03 DIAGNOSIS — E875 Hyperkalemia: Secondary | ICD-10-CM | POA: Diagnosis not present

## 2021-11-03 MED ORDER — SODIUM CHLORIDE (PF) 0.9 % IJ SOLN
INTRAMUSCULAR | Status: AC
  Filled 2021-11-03: qty 50

## 2021-11-03 MED ORDER — IOHEXOL 300 MG/ML  SOLN
75.0000 mL | Freq: Once | INTRAMUSCULAR | Status: AC | PRN
  Administered 2021-11-03: 75 mL via INTRAVENOUS

## 2021-11-04 ENCOUNTER — Inpatient Hospital Stay: Payer: Medicare HMO

## 2021-11-04 ENCOUNTER — Other Ambulatory Visit: Payer: Self-pay | Admitting: Genetic Counselor

## 2021-11-04 ENCOUNTER — Inpatient Hospital Stay (HOSPITAL_BASED_OUTPATIENT_CLINIC_OR_DEPARTMENT_OTHER): Payer: Medicare HMO | Admitting: Genetic Counselor

## 2021-11-04 ENCOUNTER — Encounter: Payer: Self-pay | Admitting: Gynecologic Oncology

## 2021-11-04 ENCOUNTER — Telehealth: Payer: Self-pay | Admitting: *Deleted

## 2021-11-04 ENCOUNTER — Other Ambulatory Visit: Payer: Self-pay

## 2021-11-04 DIAGNOSIS — Z853 Personal history of malignant neoplasm of breast: Secondary | ICD-10-CM

## 2021-11-04 DIAGNOSIS — Z800 Family history of malignant neoplasm of digestive organs: Secondary | ICD-10-CM

## 2021-11-04 DIAGNOSIS — C786 Secondary malignant neoplasm of retroperitoneum and peritoneum: Secondary | ICD-10-CM | POA: Diagnosis not present

## 2021-11-04 LAB — GENETIC SCREENING ORDER

## 2021-11-04 NOTE — Telephone Encounter (Signed)
Patient's spouse called and was following up to see if we had rec'd the daughter's records.  No records rec'd as of yet.  Gave fax and email address.

## 2021-11-04 NOTE — Progress Notes (Signed)
[employer] HRD testing ordered with [facility]er in [place] per request of Dr. Jeral Pinch.

## 2021-11-04 NOTE — Telephone Encounter (Signed)
Spoke with the patient and gave new genetics appt for today

## 2021-11-05 ENCOUNTER — Other Ambulatory Visit: Payer: Self-pay

## 2021-11-05 ENCOUNTER — Telehealth: Payer: Self-pay

## 2021-11-05 ENCOUNTER — Encounter: Payer: Self-pay | Admitting: Genetic Counselor

## 2021-11-05 ENCOUNTER — Encounter: Payer: Self-pay | Admitting: Nurse Practitioner

## 2021-11-05 ENCOUNTER — Inpatient Hospital Stay (HOSPITAL_BASED_OUTPATIENT_CLINIC_OR_DEPARTMENT_OTHER)
Admission: EM | Admit: 2021-11-05 | Discharge: 2021-11-08 | DRG: 683 | Disposition: A | Discharge status: Home Health | Payer: Medicare HMO | Attending: Internal Medicine | Admitting: Internal Medicine

## 2021-11-05 ENCOUNTER — Encounter (HOSPITAL_BASED_OUTPATIENT_CLINIC_OR_DEPARTMENT_OTHER): Payer: Self-pay

## 2021-11-05 ENCOUNTER — Encounter (HOSPITAL_COMMUNITY): Payer: Self-pay

## 2021-11-05 ENCOUNTER — Encounter: Payer: Self-pay | Admitting: *Deleted

## 2021-11-05 ENCOUNTER — Inpatient Hospital Stay: Payer: Medicare HMO

## 2021-11-05 ENCOUNTER — Emergency Department (HOSPITAL_BASED_OUTPATIENT_CLINIC_OR_DEPARTMENT_OTHER): Payer: Medicare HMO

## 2021-11-05 ENCOUNTER — Other Ambulatory Visit: Payer: Self-pay | Admitting: Oncology

## 2021-11-05 ENCOUNTER — Inpatient Hospital Stay: Payer: Medicare HMO | Admitting: Nurse Practitioner

## 2021-11-05 ENCOUNTER — Other Ambulatory Visit: Payer: Self-pay | Admitting: *Deleted

## 2021-11-05 DIAGNOSIS — Z9011 Acquired absence of right breast and nipple: Secondary | ICD-10-CM

## 2021-11-05 DIAGNOSIS — R911 Solitary pulmonary nodule: Secondary | ICD-10-CM | POA: Diagnosis present

## 2021-11-05 DIAGNOSIS — Z87891 Personal history of nicotine dependence: Secondary | ICD-10-CM

## 2021-11-05 DIAGNOSIS — Z800 Family history of malignant neoplasm of digestive organs: Secondary | ICD-10-CM

## 2021-11-05 DIAGNOSIS — Z9221 Personal history of antineoplastic chemotherapy: Secondary | ICD-10-CM

## 2021-11-05 DIAGNOSIS — Z888 Allergy status to other drugs, medicaments and biological substances status: Secondary | ICD-10-CM

## 2021-11-05 DIAGNOSIS — E861 Hypovolemia: Secondary | ICD-10-CM | POA: Diagnosis present

## 2021-11-05 DIAGNOSIS — E871 Hypo-osmolality and hyponatremia: Secondary | ICD-10-CM | POA: Diagnosis present

## 2021-11-05 DIAGNOSIS — Z432 Encounter for attention to ileostomy: Secondary | ICD-10-CM

## 2021-11-05 DIAGNOSIS — Z923 Personal history of irradiation: Secondary | ICD-10-CM

## 2021-11-05 DIAGNOSIS — E875 Hyperkalemia: Secondary | ICD-10-CM | POA: Diagnosis present

## 2021-11-05 DIAGNOSIS — I10 Essential (primary) hypertension: Secondary | ICD-10-CM | POA: Diagnosis present

## 2021-11-05 DIAGNOSIS — Z932 Ileostomy status: Secondary | ICD-10-CM

## 2021-11-05 DIAGNOSIS — K2270 Barrett's esophagus without dysplasia: Secondary | ICD-10-CM | POA: Diagnosis present

## 2021-11-05 DIAGNOSIS — E872 Acidosis, unspecified: Secondary | ICD-10-CM | POA: Diagnosis present

## 2021-11-05 DIAGNOSIS — C786 Secondary malignant neoplasm of retroperitoneum and peritoneum: Secondary | ICD-10-CM

## 2021-11-05 DIAGNOSIS — H269 Unspecified cataract: Secondary | ICD-10-CM | POA: Diagnosis present

## 2021-11-05 DIAGNOSIS — I959 Hypotension, unspecified: Secondary | ICD-10-CM | POA: Diagnosis present

## 2021-11-05 DIAGNOSIS — E876 Hypokalemia: Secondary | ICD-10-CM | POA: Diagnosis present

## 2021-11-05 DIAGNOSIS — N393 Stress incontinence (female) (male): Secondary | ICD-10-CM | POA: Diagnosis present

## 2021-11-05 DIAGNOSIS — C481 Malignant neoplasm of specified parts of peritoneum: Secondary | ICD-10-CM | POA: Diagnosis present

## 2021-11-05 DIAGNOSIS — F419 Anxiety disorder, unspecified: Secondary | ICD-10-CM | POA: Diagnosis present

## 2021-11-05 DIAGNOSIS — Z853 Personal history of malignant neoplasm of breast: Secondary | ICD-10-CM

## 2021-11-05 DIAGNOSIS — N179 Acute kidney failure, unspecified: Secondary | ICD-10-CM | POA: Diagnosis not present

## 2021-11-05 DIAGNOSIS — R112 Nausea with vomiting, unspecified: Secondary | ICD-10-CM | POA: Diagnosis present

## 2021-11-05 DIAGNOSIS — E860 Dehydration: Secondary | ICD-10-CM | POA: Diagnosis present

## 2021-11-05 DIAGNOSIS — Z931 Gastrostomy status: Secondary | ICD-10-CM

## 2021-11-05 DIAGNOSIS — C482 Malignant neoplasm of peritoneum, unspecified: Secondary | ICD-10-CM | POA: Diagnosis present

## 2021-11-05 DIAGNOSIS — E049 Nontoxic goiter, unspecified: Secondary | ICD-10-CM | POA: Diagnosis present

## 2021-11-05 DIAGNOSIS — R3915 Urgency of urination: Secondary | ICD-10-CM | POA: Diagnosis present

## 2021-11-05 DIAGNOSIS — K219 Gastro-esophageal reflux disease without esophagitis: Secondary | ICD-10-CM | POA: Diagnosis present

## 2021-11-05 DIAGNOSIS — M1990 Unspecified osteoarthritis, unspecified site: Secondary | ICD-10-CM | POA: Diagnosis present

## 2021-11-05 DIAGNOSIS — Z79899 Other long term (current) drug therapy: Secondary | ICD-10-CM

## 2021-11-05 DIAGNOSIS — E785 Hyperlipidemia, unspecified: Secondary | ICD-10-CM | POA: Diagnosis present

## 2021-11-05 HISTORY — DX: Ileostomy status: Z93.2

## 2021-11-05 HISTORY — DX: Gastrostomy status: Z93.1

## 2021-11-05 LAB — CMP (CANCER CENTER ONLY)
ALT: 14 U/L (ref 0–44)
AST: 31 U/L (ref 15–41)
Albumin: 4 g/dL (ref 3.5–5.0)
Alkaline Phosphatase: 43 U/L (ref 38–126)
BUN: 81 mg/dL — ABNORMAL HIGH (ref 8–23)
CO2: <7 mmol/L — ABNORMAL LOW (ref 22–32)
Calcium: 10 mg/dL (ref 8.9–10.3)
Chloride: 96 mmol/L — ABNORMAL LOW (ref 98–111)
Creatinine: 5.74 mg/dL (ref 0.44–1.00)
GFR, Estimated: 7 mL/min — ABNORMAL LOW (ref >60)
Glucose, Bld: 87 mg/dL (ref 70–99)
Potassium: 6.2 mmol/L — ABNORMAL HIGH (ref 3.5–5.1)
Sodium: 125 mmol/L — ABNORMAL LOW (ref 135–145)
Total Bilirubin: 0.5 mg/dL (ref 0.3–1.2)
Total Protein: 7.5 g/dL (ref 6.5–8.1)

## 2021-11-05 LAB — CBC WITH DIFFERENTIAL (CANCER CENTER ONLY)
Abs Immature Granulocytes: 0.03 10*3/uL (ref 0.00–0.07)
Basophils Absolute: 0.1 10*3/uL (ref 0.0–0.1)
Basophils Relative: 1 %
Eosinophils Absolute: 0 10*3/uL (ref 0.0–0.5)
Eosinophils Relative: 0 %
HCT: 37.9 % (ref 36.0–46.0)
Hemoglobin: 12.4 g/dL (ref 12.0–15.0)
Immature Granulocytes: 0 %
Lymphocytes Relative: 11 %
Lymphs Abs: 0.9 10*3/uL (ref 0.7–4.0)
MCH: 33.7 pg (ref 26.0–34.0)
MCHC: 32.7 g/dL (ref 30.0–36.0)
MCV: 103 fL — ABNORMAL HIGH (ref 80.0–100.0)
Monocytes Absolute: 0.6 10*3/uL (ref 0.1–1.0)
Monocytes Relative: 8 %
Neutro Abs: 6 10*3/uL (ref 1.7–7.7)
Neutrophils Relative %: 80 %
Platelet Count: 514 10*3/uL — ABNORMAL HIGH (ref 150–400)
RBC: 3.68 MIL/uL — ABNORMAL LOW (ref 3.87–5.11)
RDW: 12.1 % (ref 11.5–15.5)
WBC Count: 7.6 10*3/uL (ref 4.0–10.5)
nRBC: 0 % (ref 0.0–0.2)

## 2021-11-05 LAB — I-STAT VENOUS BLOOD GAS, ED
Acid-base deficit: 12 mmol/L — ABNORMAL HIGH (ref 0.0–2.0)
Bicarbonate: 14.9 mmol/L — ABNORMAL LOW (ref 20.0–28.0)
Calcium, Ion: 1.22 mmol/L (ref 1.15–1.40)
HCT: 30 % — ABNORMAL LOW (ref 36.0–46.0)
Hemoglobin: 10.2 g/dL — ABNORMAL LOW (ref 12.0–15.0)
O2 Saturation: 32 %
Potassium: 5.1 mmol/L (ref 3.5–5.1)
Sodium: 121 mmol/L — ABNORMAL LOW (ref 135–145)
TCO2: 16 mmol/L — ABNORMAL LOW (ref 22–32)
pCO2, Ven: 36 mmHg — ABNORMAL LOW (ref 44–60)
pH, Ven: 7.225 — ABNORMAL LOW (ref 7.25–7.43)
pO2, Ven: 23 mmHg — CL (ref 32–45)

## 2021-11-05 LAB — BASIC METABOLIC PANEL
Anion gap: 19 — ABNORMAL HIGH (ref 5–15)
BUN: 77 mg/dL — ABNORMAL HIGH (ref 8–23)
CO2: 12 mmol/L — ABNORMAL LOW (ref 22–32)
Calcium: 9.6 mg/dL (ref 8.9–10.3)
Chloride: 91 mmol/L — ABNORMAL LOW (ref 98–111)
Creatinine, Ser: 5.33 mg/dL — ABNORMAL HIGH (ref 0.44–1.00)
GFR, Estimated: 8 mL/min — ABNORMAL LOW (ref >60)
Glucose, Bld: 94 mg/dL (ref 70–99)
Potassium: 5 mmol/L (ref 3.5–5.1)
Sodium: 122 mmol/L — ABNORMAL LOW (ref 135–145)

## 2021-11-05 LAB — CYTOLOGY - NON PAP

## 2021-11-05 LAB — MAGNESIUM: Magnesium: 2.5 mg/dL — ABNORMAL HIGH (ref 1.7–2.4)

## 2021-11-05 MED ORDER — SODIUM BICARBONATE 8.4 % IV SOLN
INTRAVENOUS | Status: AC
  Filled 2021-11-05: qty 150

## 2021-11-05 MED ORDER — SODIUM BICARBONATE 8.4 % IV SOLN
INTRAVENOUS | Status: DC
  Filled 2021-11-05 (×2): qty 1000
  Filled 2021-11-05: qty 150
  Filled 2021-11-05 (×4): qty 1000

## 2021-11-05 MED ORDER — SODIUM CHLORIDE 0.9 % IV SOLN: Freq: Once | INTRAVENOUS | Status: AC

## 2021-11-05 MED ORDER — ALBUTEROL SULFATE (2.5 MG/3ML) 0.083% IN NEBU
10.0000 mg | INHALATION_SOLUTION | Freq: Once | RESPIRATORY_TRACT | Status: AC
  Administered 2021-11-05: 10 mg via RESPIRATORY_TRACT
  Filled 2021-11-05: qty 12

## 2021-11-05 MED ORDER — LACTATED RINGERS IV BOLUS
1000.0000 mL | Freq: Once | INTRAVENOUS | Status: AC
  Administered 2021-11-05: 1000 mL via INTRAVENOUS

## 2021-11-05 MED ORDER — SODIUM CHLORIDE 0.9 % IV SOLN: INTRAVENOUS | Status: DC

## 2021-11-05 MED ORDER — CALCIUM GLUCONATE 10 % IV SOLN
1.0000 g | Freq: Once | INTRAVENOUS | Status: DC
  Filled 2021-11-05: qty 10

## 2021-11-05 MED ORDER — STERILE WATER FOR INJECTION IV SOLN
INTRAVENOUS | Status: DC
  Filled 2021-11-05: qty 1000

## 2021-11-05 NOTE — Progress Notes (Signed)
[facility] OFFICE PROGRESS NOTE   Diagnosis:  Peritoneal carcinomatosis  INTERVAL HISTORY:   Nancy Harrison returns prior to scheduled follow-up for evaluation of hypotension, nausea/vomiting, watery output from the ileostomy.  Nancy Harrison is accompanied to today's visit by her husband.  She estimates 4 episodes of nausea/vomiting yesterday.  She and her husband have not tried venting the gastrostomy tube.  They state they are not familiar with doing this and need instruction.  They have not been able to obtain a bag to attach to the gastrostomy tube.  She is having watery output from the ileostomy.  She reports this has been ongoing since hospital discharge.  She estimates emptying the bag 4 times a day when it is nearly full.  She has not tried Imodium but does have this on hand at home.  She is weak.  Oral intake is very poor.  She admits suboptimal fluid intake.  Blood pressure at home has been in the low 80s.  Her husband expresses concern about difficulty starting IVs and drawing labs.  He is interested in her having a PICC line placed.  She is scheduled to have a Port-A-Cath placed next week.    Objective:  Vital signs in last 24 hours:  Temperature 97.2, heart rate 79, respirations 20, blood pressure 80/48   Chronically ill-appearing elderly female in no acute distress. HEENT: No thrush or ulcers. Resp: Lungs clear bilaterally. Cardio: Regular rate and rhythm. GI: Abdomen is soft.  G-tube site is erythematous.  Right abdomen ileostomy with watery stool in the collection bag. Vascular: No leg edema. Neuro: Alert and oriented. Skin: Marked decrease in skin turgor.   Lab Results:  Lab Results  Component Value Date   WBC 7.6 11/05/2021   HGB 12.4 11/05/2021   HCT 37.9 11/05/2021   MCV 103.0 (H) 11/05/2021   PLT 514 (H) 11/05/2021   NEUTROABS 6.0 11/05/2021    Imaging:  No results found.  Medications: I have reviewed the patient's current  medications.  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  Bone lesions concerning for metastases, right lung nodule Laparoscopy, diverting loop ileostomy, gastrostomy tube placement, peritoneal biopsy 10/16/2021, no evidence of a primary tumor site at the appendix, ovaries, or uterus.  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  CDX2, CK20, and GATA3 negative.  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   Abdominal distention/pain and diarrhea secondary to #1 3.   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  Lumpectomy and adjuvant CMF x8 followed by radiation 4. Hypertension 5. Hypothyroidism 6. Barrett's esophagus 7. Family history of multiple cancers including appendix, colon, and lung cancer  Disposition: [name] likely has primary peritoneal cancer.  The plan is to begin chemotherapy next week with Taxol/carboplatin.  She presents today with failure to thrive/dehydration/high output ileostomy/nausea and vomiting.  She is receiving IV fluids.  Labs are pending.  She will begin Imodium 1 tablet 3 times a day, titrate to stool consistency/volume.  We are arranging for IV fluids 11/06/2021 and 11/07/2021.  Peripheral IV access is poor.  There are no appointments available  for PICC line placement.  Hopefully IV access will improve with hydration.  She is scheduled for a Port-A-Cath next week.  Process for venting G-tube reviewed.  Plan for IV fluids  11/06/2021 and 11/07/2021.  Follow-up appointment with Dr. Benay Spice and chemotherapy education 11/10/2021.  Port-A-Cath placement 11/11/2021, first cycle of carboplatin/Taxol 11/12/2021.   Ned Card ANP/GNP-BC   11/05/2021  3:42 PM  Addendum 4:32 PM-chemistry panel has just resulted with significant abnormalities including sodium 125, potassium 6.2, CO2 less than 7, BUN 81, creatinine 5.74.  I spoke with Debe Coder, charge nurse in the [facility] Emergency Room.  Ms. Lauman is being transported to the ER to stabilize/admit.  I reviewed the above with Dr. Chryl Heck.

## 2021-11-05 NOTE — Telephone Encounter (Signed)
Nancy Harrison husband, Nancy Harrison, called this morning asking to speak to [employer]. He states Nancy Harrison is not doing well at all. She has not been able to eat or drink, since yesterday morning due to having N/V. She vomited 4 times yesterday. Yesterday afternoon her B/P was 77/54, this morning when she woke up it was 83/53.  He states he doesn't want to take her to the ER just yet because she is moving around just fine. He thinks if she could take N/V medication it would help. Can they get a prescription for the nausea and vomiting?  Tim is aware I will notify Nancy John NP and call him back. His CB# [phone] Pharmacy; [facility]

## 2021-11-05 NOTE — Progress Notes (Signed)
REFERRING PROVIDER: Lafonda Mosses, MD [place],  [place] [address]  PRIMARY PROVIDER:  Kristen Loader, FNP  PRIMARY REASON FOR VISIT:  1. Family history of colon cancer   2. History of breast cancer   3. 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 Gortney, a 81 y.o. female, was seen for a [place] cancer genetics consultation at the request of Dr. Berline Lopes due to a personal and family history of cancer.  Ms. Holstad presents to clinic today to discuss the possibility of a hereditary predisposition to cancer, genetic testing, and to further clarify her future cancer risks, as well as potential cancer risks for family members.   In 1999, at the age of 30, Ms. Millirons was diagnosed with breast cancer.  She was diagnosed with breast cancer a second time a few years later, and in August she was diagnosed with peritoneal cancer.  Her daughter had genetic testing and was positive for two mutation in CFTR and one in MUTYH.    CANCER HISTORY:  Oncology History   No history exists.     RISK FACTORS:  Menarche was at age 42.  First live birth at age 43.  OCP use for approximately  10-15  years.  Ovaries intact: yes.  Hysterectomy: no.  Menopausal status: postmenopausal.  HRT use: 0 years. Colonoscopy: yes; normal. Mammogram within the last year: yes. Number of breast biopsies: 2. Up to date with pelvic exams: yes.  Past Medical History:  Diagnosis Date   Arthritis    Barrett's esophagus    Path 10/2001   Breast cancer ([place]) 01/01/2011   Cancer ([place]) 1999   rt breast   Cataract    Family history of colon cancer    Hypertension    SUI (stress urinary incontinence, female)    Thyroid disease    Urinary urgency     Past Surgical History:  Procedure Laterality Date   BREAST LUMPECTOMY  1999   Chemo and radiation on right breast   LAPAROSCOPY N/A 10/16/2021   Procedure: LAPAROSCOPIC  DIVERTING OSTOMY, BIOSPY,  G-TUBE  PLACEMENT;  Surgeon: Michael Boston, MD;  Location: [facility] ORS;  Service: General;  Laterality: N/A;   [place] History   Socioeconomic History   Marital status: Married    Spouse name: Not on file   Number of children: Not on file   Years of education: Not on file   Highest education level: Not on file  Occupational History   Not on file  Tobacco Use   Smoking status: Former    Types: Cigarettes    Quit date: 02/23/1972    Years since quitting: 49.7   Smokeless tobacco: Never  Substance and Sexual Activity   Alcohol use: Yes    Alcohol/week: 8.0 standard drinks of alcohol    Types: 8 Glasses of wine per week   Drug use: No   Sexual activity: Not on file  Other Topics Concern   Not on file  Social History Narrative   Not on file   Social Determinants of Health   Financial Resource Strain: Not on file  Food Insecurity: Not on file  Transportation Needs: Not on file  Physical Activity: Not on file  Stress: Not on file  Social Connections: Not on file     FAMILY HISTORY:  We obtained a detailed, 4-generation family history.  Significant diagnoses are listed below: Family History  Problem Relation Age of Onset   Colon cancer Mother 63   Lung cancer Sister    Cancer Maternal Aunt        NOS   Cancer Daughter        appendiceal   Stomach cancer Neg Hx    Rectal cancer Neg Hx    Esophageal cancer Neg Hx     The patient has three children, one of whom had appendiceal cancer at 25.  This daughter had genetic testing and was found to have two CFTR pathogenic mutations and one single pathogenic MUTYH mutation. She has a maternal half sister who has lung cancer.  Her parents are deceased.  The patient never knew her biological father.  There is no information on that side of the family.  The patient's mother had colon cancer over age 68.  She had eight siblings, two sisters had unknown cancers.  Her maternal  grandparents are deceased. .  Ms. Bialas is aware of previous family history of genetic testing for hereditary cancer risks. Patient's maternal ancestors are of Caucasian descent, and paternal ancestors are of Caucasian descent. There is no reported Ashkenazi Jewish ancestry. There is no known consanguinity.  GENETIC COUNSELING ASSESSMENT: Ms. Stroder is a 81 y.o. female with a personal and family history of cancer which is somewhat suggestive of a hereditary cancer syndrome and predisposition to cancer given the combination of cancer, young ages of onset and her daughter's genetic testing. We, therefore, discussed and recommended the following at today's visit.   DISCUSSION: We discussed that, in general, most cancer is not inherited in families, but instead is sporadic or familial. Sporadic cancers occur by chance and typically happen at older ages (>50 years) as this type of cancer is caused by genetic changes acquired during an individual's lifetime. Some families have more cancers than would be expected by chance; however, the ages or types of cancer are not consistent with a known genetic mutation or known genetic mutations have been ruled out. This type of familial cancer is thought to be due to a combination of multiple genetic, environmental, hormonal, and lifestyle factors. While this combination of factors likely increases the risk of cancer, the exact source of this risk is not currently identifiable or testable.  We discussed that 5 - 10% of breast cancer and up to 20% of peritoneal cancer is hereditary, with most cases associated with BRCA mutation.  There are other genes that can be associated with hereditary breast cancer syndromes.  These include ATM, CHEK2 and PALB2.  We discussed that testing is beneficial for several reasons including knowing how to follow individuals after completing their treatment, identifying whether potential treatment options such as PARP inhibitors would be  beneficial, and understand if other family members could be at risk for cancer and allow them to undergo genetic testing.   We reviewed the characteristics, features and inheritance patterns of hereditary cancer syndromes. We also discussed genetic testing, including the appropriate family members to test, the process of testing, insurance coverage and turn-around-time for results. We discussed the implications of a negative, positive, carrier and/or variant of uncertain significant result. Ms. Toren  was offered a common hereditary cancer panel (47 genes) and an expanded pan-cancer panel (77 genes). Ms. Brandt was informed of the benefits and limitations of each panel, including that expanded pan-cancer panels contain genes that do not have clear management guidelines at this point in time.  We also discussed that  as the number of genes included on a panel increases, the chances of variants of uncertain significance increases. Ms. Weyandt decided to pursue genetic testing for the Common Hereditary Cancer gene panel plus CFTR.   Based on Ms. Tinnon's personal and family history of cancer, she meets medical criteria for genetic testing. Despite that she meets criteria, she may still have an out of pocket cost. We discussed that if her out of pocket cost for testing is over $100, the laboratory will call and confirm whether she wants to proceed with testing.  If the out of pocket cost of testing is less than $100 she will be billed by the genetic testing laboratory.   PLAN: After considering the risks, benefits, and limitations, Ms. Sarli provided informed consent to pursue genetic testing and the blood sample was sent to [facility] for analysis of the Common Hereditary Cancer Panel + CFTR. Results should be available within approximately 2-3 weeks' time, at which point they will be disclosed by telephone to Ms. Ernster, as will any additional recommendations warranted by these results. Ms.  Leaming will receive a summary of her genetic counseling visit and a copy of her results once available. This information will also be available in Epic.   Lastly, we encouraged Ms. Falter to remain in contact with cancer genetics annually so that we can continuously update the family history and inform her of any changes in cancer genetics and testing that may be of benefit for this family.   Ms. Villella's questions were answered to her satisfaction today. Our contact information was provided should additional questions or concerns arise. Thank you for the referral and allowing [place] to share in the care of your patient.   Shelsey Rieth P. Florene Glen, [place], [facility] Licensed, Insurance risk surveyor [email or web address] phone: [phone]  The patient was seen for a total of 37 minutes in face-to-face genetic counseling.  The patient brought her husband. Drs. Michell Heinrich, and/or [place] were available for questions, if needed..    _______________________________________________________________________ For Office Staff:  Number of people involved in session: 2 Was an Intern/ student involved with case: yes Carloyn Manner

## 2021-11-05 NOTE — ED Notes (Signed)
Pt's BP 95/58. Informed Courtney - RN. Will retake BP.

## 2021-11-05 NOTE — Progress Notes (Signed)
PATIENT NAVIGATOR PROGRESS NOTE  Name: Sumi Lye Date: 11/05/2021 MRN: [id number]  DOB: 07-26-1940   Reason for visit: Phone call from husband, Lashara Urey reporting Low BP and vomiting overnight  Comments:  Called and spoke with Mrs Whan who reported low BP of 83/53 and pulse of 85. She states that she feels fine. Last episode of vomiting was at 0500 this morning. She reports that she did not vent the PEG tube when she became nauseated. Stated that they don't have the bag needed to vent it   Ileostomy is emptied 4-5 times a day per husband.  Will come to clinic today for labs, evaluation and IV fluids.    Time spent counseling/coordinating care: 45-60 minutes

## 2021-11-05 NOTE — Progress Notes (Signed)
CRITICAL VALUE STICKER  CRITICAL VALUE: Creatinine 5.74 and CO2 less than 7  RECEIVER (on-site recipient of call):  DATE & TIME NOTIFIED: 11/05/2021 at [employer] (representative from lab): Delman Cheadle  MD NOTIFIED: Ned Card, NP  TIME OF NOTIFICATION: 11/05/2021 at 1629  RESPONSE:  Patient sent to emergency room.

## 2021-11-05 NOTE — ED Provider Notes (Signed)
[facility] EMERGENCY DEPT Provider Note   CSN: [id number] Arrival date & time: 11/05/21  1645     History  Chief Complaint  Patient presents with   Emesis    Nancy Harrison is a 81 y.o. female.   Emesis Patient presents with nausea vomiting.  Sent from cancer center.  Has had hypotension.  Has not ileostomy.  Has peritoneal cancer.  Also has G-tube but has not been feeding through it because she was never taught how.  Hypotensive at the cancer center and found to have acute renal failure with potassium of 6.2 bicarb less than 7 and creatinine of more than 5.  BUN is 80.     Home Medications Prior to Admission medications   Medication Sig Start Date End Date Taking? Authorizing Provider  acetaminophen (TYLENOL) 325 MG tablet Take 2 tablets (650 mg total) by mouth every 6 (six) hours as needed for mild pain or fever. 10/22/21   Meuth, Blaine Hamper, PA-C  bisoprolol-hydrochlorothiazide (ZIAC) 10-6.25 MG per tablet Take 1 tablet by mouth daily.      [provider]  ibuprofen (ADVIL,MOTRIN) 200 MG tablet Take 400-600 mg by mouth daily as needed for mild pain. Aches and pain Patient not taking: Reported on 10/27/2021    [provider]  lisinopril (PRINIVIL,ZESTRIL) 10 MG tablet Take 10 mg by mouth daily.      [provider]  loperamide (IMODIUM A-D) 2 MG tablet Take 2-4 mg by mouth 4 (four) times daily as needed for diarrhea or loose stools. Patient not taking: Reported on 10/27/2021    [provider]  loratadine (CLARITIN) 10 MG tablet Take 10 mg by mouth See admin instructions. Take 10 mg by mouth one to two times a day as needed for allergies    [provider]  methocarbamol (ROBAXIN) 500 MG tablet Take 1 tablet (500 mg total) by mouth every 6 (six) hours as needed for muscle spasms. 10/22/21   Meuth, Brooke A, PA-C  Multiple Vitamin (MULTIVITAMIN PO) Take 1 tablet by mouth daily with breakfast.    [provider]       Allergies    Sporanox [itraconazole]    Review of Systems   Review of Systems  Gastrointestinal:  Positive for vomiting.    Physical Exam Updated Vital Signs BP (!) 98/52   Pulse (!) 113   Temp 98.2 F (36.8 C) (Oral) Comment: Pt oberved to be shivering -- extra blankets provided  Resp (!) 24   Ht '4\' 11"'$  (1.499 m)   Wt 53.6 kg   SpO2 96%   BMI 23.87 kg/m  Physical Exam HENT:     Head: Atraumatic.  Eyes:     Pupils: Pupils are equal, round, and reactive to light.  Cardiovascular:     Rate and Rhythm: Regular rhythm.  Abdominal:     Tenderness: There is abdominal tenderness.     Comments: Ileostomy right lower quadrant.  Some watery liquid in the bag.  PEG tube left upper quadrant.  Mild abdominal tenderness without severe distention.  Musculoskeletal:        General: No tenderness.     Cervical back: Neck supple.  Skin:    General: Skin is warm.  Neurological:     Mental Status: She is alert and oriented to person, place, and time.     ED Results / Procedures / Treatments   Labs (all labs ordered are listed, but only abnormal results are displayed) Labs Reviewed  BASIC  METABOLIC PANEL - Abnormal; Notable for the following components:      Result Value   Sodium 122 (*)    Chloride 91 (*)    CO2 12 (*)    BUN 77 (*)    Creatinine, Ser 5.33 (*)    GFR, Estimated 8 (*)    Anion gap 19 (*)    All other components within normal limits  I-STAT VENOUS BLOOD GAS, ED - Abnormal; Notable for the following components:   pH, Ven 7.225 (*)    pCO2, Ven 36.0 (*)    pO2, Ven 23 (*)    Bicarbonate 14.9 (*)    TCO2 16 (*)    Acid-base deficit 12.0 (*)    Sodium 121 (*)    HCT 30.0 (*)    Hemoglobin 10.2 (*)    All other components within normal limits  URINALYSIS, ROUTINE W REFLEX MICROSCOPIC    EKG None  Radiology US Renal  Result Date: 11/05/2021 CLINICAL DATA:  Acute kidney injury EXAM: RENAL / URINARY TRACT ULTRASOUND COMPLETE COMPARISON:  None  Available. FINDINGS: Right Kidney: Renal measurements: 8.9 x 4.3 x 3.8 cm = volume: 76.4 mL. Echogenicity within normal limits. No mass or hydronephrosis visualized. Left Kidney: Renal measurements: 10.2 x 5.8 x 6.0 cm = volume: 186.8 mL. Echogenicity within normal limits. No mass or hydronephrosis visualized. Bladder: Decompressed. Other: None. IMPRESSION: No hydronephrosis. Electronically Signed   By: Maurine Simmering M.D.   On: 11/05/2021 19:59    Procedures Procedures    Medications Ordered in ED Medications  sodium bicarbonate 150 mEq in dextrose 5 % 1,150 mL infusion ( Intravenous New Bag/Given 11/05/21 1927)  sodium bicarbonate 1 mEq/mL injection (  Not Given 11/05/21 2005)  lactated ringers bolus 1,000 mL (0 mLs Intravenous Stopped 11/05/21 1923)  albuterol (PROVENTIL) (2.5 MG/3ML) 0.083% nebulizer solution 10 mg (10 mg Nebulization Given 11/05/21 1820)    ED Course/ Medical Decision Making/ A&P                           Medical Decision Making Amount and/or Complexity of Data Reviewed Labs: ordered. Radiology: ordered.  Risk Prescription drug management.   Patient with acute renal failure likely secondary to dehydration.  Has had decreased oral intake.  Cannot eat and drink much and has a PEG tube that has not been used due to instruction.  Also has had output from her ileostomy.  History of carcinomatosis.  Had some hypotension at the cancer center.  Fluids given now improved over 100.  However with acute renal failure and hyperkalemia will require admission to the hospital.  Will discuss with nephrology.  Discussed with Dr. Hollie Salk.  Recommended bolus of lactated Ringer's and bicarb drip for fluid.  Remained somewhat hypotensive.  Doubt a severe sepsis at this time.  On recheck potassium and had improved.  Have been given albuterol but will not give the calcium chloride at this time.  Renal ultrasound done and reassuring.  I think most likely is secondary to volume depletion. Dr. Hollie Salk  stated that from a renal standpoint patient to be admitted at [facility] long hospital.  CRITICAL CARE Performed by: Davonna Belling Total critical care time: 30 minutes Critical care time was exclusive of separately billable procedures and treating other patients. Critical care was necessary to treat or prevent imminent or life-threatening deterioration. Critical care was time spent personally by me on the following activities: development of treatment plan with patient and/or surrogate  as well as nursing, discussions with consultants, evaluation of patient's response to treatment, examination of patient, obtaining history from patient or surrogate, ordering and performing treatments and interventions, ordering and review of laboratory studies, ordering and review of radiographic studies, pulse oximetry and re-evaluation of patient's condition.         Final Clinical Impression(s) / ED Diagnoses Final diagnoses:  AKI (acute kidney injury) [facility])    Rx / [place] Orders ED Discharge Orders     None         Davonna Belling, MD 11/05/21 2020

## 2021-11-05 NOTE — ED Triage Notes (Signed)
Patient here POV from [facility].  Endorses N/V for approximately 1 Week. Began shortly after having Ileostomy placed. Hypotension was noted Yesterday. Infusion was being completed at [facility] but Hypotension could not be corrected and was recommended for ED Evaluation.   No Known Fevers. PEG Tube in Place as well.   NAD Noted during Triage. A&Ox4. GCS 15. BIB Wheelchair.

## 2021-11-06 ENCOUNTER — Inpatient Hospital Stay: Payer: Medicare HMO

## 2021-11-06 ENCOUNTER — Encounter (HOSPITAL_BASED_OUTPATIENT_CLINIC_OR_DEPARTMENT_OTHER): Payer: Self-pay | Admitting: Internal Medicine

## 2021-11-06 ENCOUNTER — Other Ambulatory Visit: Payer: Self-pay | Admitting: Oncology

## 2021-11-06 DIAGNOSIS — Z931 Gastrostomy status: Secondary | ICD-10-CM | POA: Diagnosis not present

## 2021-11-06 DIAGNOSIS — H269 Unspecified cataract: Secondary | ICD-10-CM | POA: Diagnosis not present

## 2021-11-06 DIAGNOSIS — E861 Hypovolemia: Secondary | ICD-10-CM | POA: Diagnosis not present

## 2021-11-06 DIAGNOSIS — E871 Hypo-osmolality and hyponatremia: Secondary | ICD-10-CM

## 2021-11-06 DIAGNOSIS — K2270 Barrett's esophagus without dysplasia: Secondary | ICD-10-CM | POA: Diagnosis not present

## 2021-11-06 DIAGNOSIS — M1990 Unspecified osteoarthritis, unspecified site: Secondary | ICD-10-CM | POA: Diagnosis not present

## 2021-11-06 DIAGNOSIS — E860 Dehydration: Secondary | ICD-10-CM | POA: Diagnosis not present

## 2021-11-06 DIAGNOSIS — I959 Hypotension, unspecified: Secondary | ICD-10-CM | POA: Diagnosis not present

## 2021-11-06 DIAGNOSIS — C481 Malignant neoplasm of specified parts of peritoneum: Secondary | ICD-10-CM | POA: Diagnosis not present

## 2021-11-06 DIAGNOSIS — C786 Secondary malignant neoplasm of retroperitoneum and peritoneum: Secondary | ICD-10-CM | POA: Diagnosis not present

## 2021-11-06 DIAGNOSIS — N179 Acute kidney failure, unspecified: Secondary | ICD-10-CM

## 2021-11-06 DIAGNOSIS — E785 Hyperlipidemia, unspecified: Secondary | ICD-10-CM | POA: Diagnosis not present

## 2021-11-06 DIAGNOSIS — R112 Nausea with vomiting, unspecified: Secondary | ICD-10-CM | POA: Diagnosis not present

## 2021-11-06 DIAGNOSIS — I10 Essential (primary) hypertension: Secondary | ICD-10-CM | POA: Diagnosis not present

## 2021-11-06 DIAGNOSIS — Z932 Ileostomy status: Secondary | ICD-10-CM | POA: Diagnosis not present

## 2021-11-06 DIAGNOSIS — C482 Malignant neoplasm of peritoneum, unspecified: Secondary | ICD-10-CM | POA: Diagnosis not present

## 2021-11-06 DIAGNOSIS — E049 Nontoxic goiter, unspecified: Secondary | ICD-10-CM | POA: Diagnosis not present

## 2021-11-06 DIAGNOSIS — Z853 Personal history of malignant neoplasm of breast: Secondary | ICD-10-CM | POA: Diagnosis not present

## 2021-11-06 DIAGNOSIS — Z87891 Personal history of nicotine dependence: Secondary | ICD-10-CM | POA: Diagnosis not present

## 2021-11-06 DIAGNOSIS — Z800 Family history of malignant neoplasm of digestive organs: Secondary | ICD-10-CM | POA: Diagnosis not present

## 2021-11-06 DIAGNOSIS — F419 Anxiety disorder, unspecified: Secondary | ICD-10-CM | POA: Diagnosis not present

## 2021-11-06 DIAGNOSIS — E872 Acidosis, unspecified: Secondary | ICD-10-CM | POA: Diagnosis not present

## 2021-11-06 DIAGNOSIS — E876 Hypokalemia: Secondary | ICD-10-CM | POA: Diagnosis not present

## 2021-11-06 DIAGNOSIS — E875 Hyperkalemia: Secondary | ICD-10-CM | POA: Diagnosis not present

## 2021-11-06 DIAGNOSIS — K219 Gastro-esophageal reflux disease without esophagitis: Secondary | ICD-10-CM | POA: Diagnosis not present

## 2021-11-06 LAB — BASIC METABOLIC PANEL
Anion gap: 16 — ABNORMAL HIGH (ref 5–15)
BUN: 59 mg/dL — ABNORMAL HIGH (ref 8–23)
CO2: 25 mmol/L (ref 22–32)
Calcium: 8.4 mg/dL — ABNORMAL LOW (ref 8.9–10.3)
Chloride: 87 mmol/L — ABNORMAL LOW (ref 98–111)
Creatinine, Ser: 2.12 mg/dL — ABNORMAL HIGH (ref 0.44–1.00)
GFR, Estimated: 23 mL/min — ABNORMAL LOW (ref >60)
Glucose, Bld: 115 mg/dL — ABNORMAL HIGH (ref 70–99)
Potassium: 3.1 mmol/L — ABNORMAL LOW (ref 3.5–5.1)
Sodium: 128 mmol/L — ABNORMAL LOW (ref 135–145)

## 2021-11-06 LAB — RENAL FUNCTION PANEL
Albumin: 3.3 g/dL — ABNORMAL LOW (ref 3.5–5.0)
Anion gap: 15 (ref 5–15)
BUN: 74 mg/dL — ABNORMAL HIGH (ref 8–23)
CO2: 19 mmol/L — ABNORMAL LOW (ref 22–32)
Calcium: 8.3 mg/dL — ABNORMAL LOW (ref 8.9–10.3)
Chloride: 91 mmol/L — ABNORMAL LOW (ref 98–111)
Creatinine, Ser: 3.87 mg/dL — ABNORMAL HIGH (ref 0.44–1.00)
GFR, Estimated: 11 mL/min — ABNORMAL LOW (ref >60)
Glucose, Bld: 153 mg/dL — ABNORMAL HIGH (ref 70–99)
Phosphorus: 8.5 mg/dL — ABNORMAL HIGH (ref 2.5–4.6)
Potassium: 3.4 mmol/L — ABNORMAL LOW (ref 3.5–5.1)
Sodium: 125 mmol/L — ABNORMAL LOW (ref 135–145)

## 2021-11-06 MED ORDER — METHOCARBAMOL 500 MG PO TABS
500.0000 mg | ORAL_TABLET | Freq: Four times a day (QID) | ORAL | Status: DC | PRN
  Administered 2021-11-06: 500 mg via ORAL
  Filled 2021-11-06: qty 1

## 2021-11-06 MED ORDER — SODIUM BICARBONATE 8.4 % IV SOLN
INTRAVENOUS | Status: AC
  Filled 2021-11-06: qty 150

## 2021-11-06 MED ORDER — ACETAMINOPHEN 325 MG PO TABS
650.0000 mg | ORAL_TABLET | Freq: Four times a day (QID) | ORAL | Status: DC | PRN
  Administered 2021-11-06 – 2021-11-07 (×2): 650 mg via ORAL
  Filled 2021-11-06 (×2): qty 2

## 2021-11-06 MED ORDER — LOPERAMIDE HCL 2 MG PO CAPS: 2.0000 mg | ORAL_CAPSULE | Freq: Four times a day (QID) | ORAL | Status: DC | PRN

## 2021-11-06 MED ORDER — ONDANSETRON HCL 4 MG PO TABS: 4.0000 mg | ORAL_TABLET | Freq: Four times a day (QID) | ORAL | Status: DC | PRN

## 2021-11-06 MED ORDER — HEPARIN SODIUM (PORCINE) 5000 UNIT/ML IJ SOLN
5000.0000 [IU] | Freq: Three times a day (TID) | INTRAMUSCULAR | Status: DC
  Administered 2021-11-06 – 2021-11-07 (×4): 5000 [IU] via SUBCUTANEOUS
  Filled 2021-11-06 (×4): qty 1

## 2021-11-06 MED ORDER — ONDANSETRON HCL 4 MG/2ML IJ SOLN
4.0000 mg | Freq: Four times a day (QID) | INTRAMUSCULAR | Status: DC | PRN
  Administered 2021-11-07: 4 mg via INTRAVENOUS
  Filled 2021-11-06: qty 2

## 2021-11-06 NOTE — Assessment & Plan Note (Signed)
Likely hypovolemia hyponatremia in setting of dehydration, improving with IVF Strict I/O Holding hctz  Repeat bmp tonight and trend

## 2021-11-06 NOTE — Assessment & Plan Note (Signed)
Likely due to uremia from dehydration Improved today with IVF and improvement in renal function zofran prn, continue IVF

## 2021-11-06 NOTE — Assessment & Plan Note (Signed)
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 Lumpectomy and adjuvant CMF x8 followed by radiation

## 2021-11-06 NOTE — ED Notes (Signed)
Colostomy bag was emptied.

## 2021-11-06 NOTE — Assessment & Plan Note (Signed)
Peg tube care

## 2021-11-06 NOTE — H&P (Signed)
History and Physical    Patient: Nancy Harrison JJH:[id number] DOB: 08-18-1940 DOA: 11/05/2021 DOS: the patient was seen and examined on 11/06/2021 PCP: Kristen Loader, FNP  Patient coming from:  [facility]  - lives with her husband.    Chief Complaint: N/V, high output ostomy   HPI: Nancy Harrison is a 81 y.o. female with medical history significant of  significant of breast cancer, anxiety, hypertension, hyperlipidemia, GERD, goiter and recent diagnosis of stage IV extraovarian primary peritoneal carcinoma. Was admitted on 10/15/21 for colonic mass and impending colonic obstruction. S/p laparoscopic loop ileostomy, G-tube and peritoneal biopsy on 8/25 by Dr. Johney Maine. She came to ED due to abnormal labs from oncology showing acute renal failure, dehydration and hyperkalemia with acidosis. She states she has had N/V for the past 3-4 days. After her surgery she was not eating much or drinking much. She also has had high volume output in her ostomy bag. She also has not urinated in nearly 2 days and finally was able to urinate today.   Denies any fever/chills, vision changes/headaches, chest pain or palpitations, shortness of breath or cough, dysuria or leg swelling.   She does not smoke, she drinks alcohol once/week.   ER Course:  vitals: afebrile, bp: 101/64, HR: 88, RR: 16, oxygen: 98%RA Pertinent labs: platelets 514, sodium: 122, co2:12, BUN: 77, creatinine: 5.33, AG: 19, pH: 7.225 Renal US: no hydronephrosis.  In ED: given 2L IVF bolus and started on bicarb drip. Discussed with on call nephrology. TRH asked to admit.    Review of Systems: As mentioned in the history of present illness. All other systems reviewed and are negative. 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   Cataract    Family history of colon cancer    Gastrostomy in  place [facility])    LUQ - placed 10/16/2021   Hypertension    Ileostomy, has currently [facility])    placed 09/2021   SUI (stress urinary incontinence, female)    Thyroid disease    Urinary urgency    Past Surgical History:  Procedure Laterality Date   BREAST LUMPECTOMY  1999   Chemo and radiation on right breast   LAPAROSCOPY N/A 10/16/2021   Procedure: LAPAROSCOPIC  DIVERTING OSTOMY, BIOSPY,  G-TUBE PLACEMENT;  Surgeon: Michael Boston, MD;  Location: [facility];  Service: General;  Laterality: N/A;   [place] History:  reports that she quit smoking about 49 years ago. Her smoking use included cigarettes. She has never used smokeless tobacco. She reports current alcohol use of about 8.0 standard drinks of alcohol per week. She reports that she does not use drugs.  Allergies  Allergen Reactions   Sporanox [Itraconazole] Rash    Family History  Problem Relation Age of Onset   Colon cancer Mother 42   Lung cancer Sister    Cancer Maternal Aunt        NOS   Cancer Daughter        appendiceal   Stomach cancer Neg Hx    Rectal cancer Neg Hx    Esophageal cancer Neg Hx     Prior to Admission medications   Medication Sig Start Date End Date Taking? Authorizing Provider  acetaminophen (TYLENOL) 325 MG  tablet Take 2 tablets (650 mg total) by mouth every 6 (six) hours as needed for mild pain or fever. 10/22/21   Meuth, Blaine Hamper, PA-C  bisoprolol-hydrochlorothiazide (ZIAC) 10-6.25 MG per tablet Take 1 tablet by mouth daily.      [provider]  ibuprofen (ADVIL,MOTRIN) 200 MG tablet Take 400-600 mg by mouth daily as needed for mild pain. Aches and pain Patient not taking: Reported on 10/27/2021    [provider]  lisinopril (PRINIVIL,ZESTRIL) 10 MG tablet Take 10 mg by mouth daily.      [provider]  loperamide (IMODIUM A-D) 2 MG tablet Take 2-4 mg by mouth 4 (four) times daily as needed for diarrhea or loose  stools. Patient not taking: Reported on 10/27/2021    [provider]  loratadine (CLARITIN) 10 MG tablet Take 10 mg by mouth See admin instructions. Take 10 mg by mouth one to two times a day as needed for allergies    [provider]  methocarbamol (ROBAXIN) 500 MG tablet Take 1 tablet (500 mg total) by mouth every 6 (six) hours as needed for muscle spasms. 10/22/21   Meuth, Brooke A, PA-C  Multiple Vitamin (MULTIVITAMIN PO) Take 1 tablet by mouth daily with breakfast.    [provider]    Physical Exam: Vitals:   11/06/21 1130 11/06/21 1422 11/06/21 1455 11/06/21 1637  BP: 107/64  96/60 113/68  Pulse: 90  88 92  Resp: (!) 23  (!) 22 18  Temp:  98.4 F (36.9 C)    TempSrc:  Oral    SpO2: 99%  98% 99%  Weight:      Height:       General:  Appears calm and comfortable and is in NAD thin.  Eyes:  PERRL, EOMI, normal lids, iris ENT:  grossly normal hearing, lips & tongue, mmm; appropriate dentition Neck:  no LAD, masses or thyromegaly; no carotid bruits Cardiovascular:  RRR, no m/r/g. No LE edema.  Respiratory:   CTA bilaterally with no wheezes/rales/rhonchi.  Normal respiratory effort. Abdomen:  soft, NT, ND, NABS. PEG tube and ostomy in place with no surrounding erythema. Output in ostomy bag  Back:   normal alignment, no CVAT Skin:  no rash or induration seen on limited exam Musculoskeletal:  grossly normal tone BUE/BLE, good ROM, no bony abnormality Lower extremity:  No LE edema.  Limited foot exam with no ulcerations.  2+ distal pulses. Psychiatric:  grossly normal mood and affect, speech fluent and appropriate, AOx3 Neurologic:  CN 2-12 grossly intact, moves all extremities in coordinated fashion, sensation intact   Radiological Exams on Admission: Independently reviewed - see discussion in A/P where applicable  US Renal  Result Date: 11/05/2021 CLINICAL DATA:  Acute kidney injury EXAM: RENAL / URINARY TRACT ULTRASOUND COMPLETE COMPARISON:  None  Available. FINDINGS: Right Kidney: Renal measurements: 8.9 x 4.3 x 3.8 cm = volume: 76.4 mL. Echogenicity within normal limits. No mass or hydronephrosis visualized. Left Kidney: Renal measurements: 10.2 x 5.8 x 6.0 cm = volume: 186.8 mL. Echogenicity within normal limits. No mass or hydronephrosis visualized. Bladder: Decompressed. Other: None. IMPRESSION: No hydronephrosis. Electronically Signed   By: Maurine Simmering M.D.   On: 11/05/2021 19:59    EKG: Independently reviewed.  NSR with rate 82; nonspecific ST changes with no evidence of acute ischemia   Labs on Admission: I have personally reviewed the available labs and imaging studies at the time of the admission.  Pertinent labs:   platelets 514,  sodium: 122,  co2:12,  BUN: 77,  creatinine: 5.33,  AG: 19, pH: 7.225  Assessment and Plan: Principal Problem:   ARF (acute renal failure) (HCC) Active Problems:   Extraovarian primary peritoneal carcinoma (HCC)   Nausea & vomiting   Hyponatremia   Essential hypertension   Ileostomy care ([place])   Gastrostomy tube in place [facility])   History of breast cancer    Assessment and Plan: * ARF (acute renal failure) ([place]) 81 year old female recently diagnosed with extraovarian primary peritoneal carcinoma who is s/p ileostomy and PEG tube placement on 8/25 who has had poor PO intake and 3-4 day history of N/V and high output ostomy found to be in acute renal failure.  -admitted to progressive -likely pre renal in setting of exogenous losses and poor PO intake +hypotension  -renal function has improved with creatinine 5.7>5.33>3.8 -nephrology called by EDP and let them know that she is here -continue bicarb IVF per nephrology  -strict I/O -UA pending -hold nephrotoxic drugs -maintain MAP >65 -renal US with no acute finding -trend  Extraovarian primary peritoneal carcinoma ([place]) -CT abdomen/pelvis 10/09/2021-irregular mass in the distal transverse colon with dilation of the transverse and  right colon, and distal small bowel.  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  Bone lesions concerning for metastases, right lung nodule -Laparoscopy, diverting loop ileostomy, gastrostomy tube placement, peritoneal biopsy 10/16/2021, no evidence of a primary tumor site at the appendix, ovaries, or uterus.  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 -she has poor access and may  Need port placed while in hospital this admit  -sent Dr. Ammie Dalton message to let him know she is here     Nausea & vomiting Likely due to uremia from dehydration Improved today with IVF and improvement in renal function zofran prn, continue IVF   Hyponatremia Likely hypovolemia hyponatremia in setting of dehydration, improving with IVF Strict I/O Holding hctz  Repeat bmp tonight and trend   Essential hypertension Hold all anti-HTN meds with hypotension and AKI   Ileostomy care [facility]) Ostomy nurse consult Output appears normal in ostomy bag Monitor output Imodium prn   Gastrostomy tube in place [facility]) Peg tube care   History of breast cancer Right breast cancer June 1999, stage Ia (T1CN0), ER negative, PR negative, HER2 negative.  Lumpectomy and adjuvant CMF x8 followed by radiation   Advance Care Planning:   Code Status: Full Code   Consults: sent oncology secure message/nephrology, Dr. Joelyn Oms, ostomy care   DVT Prophylaxis: heparin   Family Communication: husband at bedside: Nancy Harrison   Severity of Illness: The appropriate patient status for this patient is INPATIENT. Inpatient status is judged to be reasonable and necessary in order to provide the required intensity of service to ensure the patient's safety. The patient's presenting symptoms, physical exam  findings, and initial radiographic and laboratory data in the context of their chronic comorbidities is felt to place them at high risk for further clinical deterioration. Furthermore, it is not anticipated that the patient will be medically stable for discharge from the hospital within 2 midnights of admission.   * I certify that at the point of admission it is my clinical judgment that the patient will require inpatient hospital care spanning beyond 2 midnights from the point of admission due to high intensity of service, high risk for further  deterioration and high frequency of surveillance required.*  Author: Orma Flaming, MD 11/06/2021 7:35 PM  For on call review [email or web address].

## 2021-11-06 NOTE — Assessment & Plan Note (Signed)
-  CT abdomen/pelvis 10/09/2021-irregular mass in the distal transverse colon with dilation of the transverse and right colon, and distal small bowel. 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 Bone lesions concerning for metastases, right lung nodule -Laparoscopy, diverting loop ileostomy, gastrostomy tube placement, peritoneal biopsy 10/16/2021, no evidence of a primary tumor site at the appendix, ovaries, or uterus. 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 -she has poor access and may  Need port placed while in hospital this admit  -sent Dr. Ammie Dalton message to let him know she is here

## 2021-11-06 NOTE — Telephone Encounter (Signed)
I spoke to Tim, , pt's husband, yesterday afternoon. He is aware of Dr.Sherrill's office being in touch with [place].   He was asking if Dr.Tucker was still on board with Collie Siad starting Chemo next week.   Tim aware I will call him back with advise from Dr.Tucker.

## 2021-11-06 NOTE — ED Notes (Signed)
Pt requested home tylenol and home robaxin for chronic BLE pain/spasms -- Dr. Laverta Baltimore notified and meds subsequently ordered and administered (see MAR).  This nurse also confirmed with pt that she has not voided since arrival -- purewick placed prior to shift change (11/05/2021 1830hrs) -- cannister is empty-- pt states since ileostomy she believes urine mostly exits via rectum and ileostomy -- bladder scan completed and obtained 73m for reading #2; 053mfor reading #1 -- ED attending Dr. LoLaverta Baltimoreotified via secure chat

## 2021-11-06 NOTE — Assessment & Plan Note (Signed)
Hold all anti-HTN meds with hypotension and AKI

## 2021-11-06 NOTE — Assessment & Plan Note (Addendum)
81 year old female recently diagnosed with extraovarian primary peritoneal carcinoma who is s/p ileostomy and PEG tube placement on 8/25 who has had poor PO intake and 3-4 day history of N/V and high output ostomy found to be in acute renal failure.  -admitted to progressive -likely pre renal in setting of exogenous losses and poor PO intake +hypotension  -renal function has improved with creatinine 5.7>5.33>3.8 -nephrology called by EDP and let them know that she is here -continue bicarb IVF per nephrology  -strict I/O -UA pending -hold nephrotoxic drugs -maintain MAP >65 -renal US with no acute finding -trend

## 2021-11-06 NOTE — ED Notes (Addendum)
Late entry -- Pt calling out to report RUE pain - when RUE assessed pt U/S PIV with Bicarb drip infusing has infiltrated- site edematous proximal and distal to insertion site.  Fluids stopped and IV subsequently d/c'd -- RUE elevated on multiple folded blankets and heat pack applied to site - will continue to monitor.

## 2021-11-06 NOTE — Consult Note (Signed)
[place] Nurse ostomy consult note:  Consulted by Nursing for guidance on provision of ostomy supplies. Patient discharged 2 weeks ago.  Stoma type/location: RLQ ileostomy Stomal assessment/size: 1 and 1/2 inches round, red, moist Peristomal assessment: Not seen today Treatment options for stomal/peristomal skin:  Output: light colored effluent Ostomy pouching: 1pc convex pouching system  Education provided: None Enrolled patient in [place] program: Yes, during last admission  Ostomy supplies:  [place] 1-piece convex ostomy Roselee Culver # [id number].  Please order 5 of this pouc and have at bedside.  [place] nursing team will not follow, but will remain available to this patient, the nursing and medical teams.  Please re-consult if needed.  Thank you for inviting [place] to participate in this patient's Plan of Care.  Maudie Flakes, MSN, RN, CNS, [place], Serita Grammes, [employer], [employer] phone:  [phone]

## 2021-11-06 NOTE — Assessment & Plan Note (Signed)
Ostomy nurse consult Output appears normal in ostomy bag Monitor output Imodium prn

## 2021-11-06 NOTE — Progress Notes (Signed)
DISCONTINUE ON PATHWAY REGIMEN - Other     START OFF PATHWAY REGIMEN - Other   [id number]:Carboplatin AUC=5 IV D1 + Paclitaxel 175 mg/m2 IV D1 q21 Days:   A cycle is every 21 days:     Paclitaxel      Carboplatin   **Always confirm dose/schedule in your pharmacy ordering system**  Patient Characteristics: Intent of Therapy: Non-Curative / Palliative Intent, Discussed with Patient

## 2021-11-07 ENCOUNTER — Inpatient Hospital Stay: Payer: Medicare HMO

## 2021-11-07 ENCOUNTER — Other Ambulatory Visit: Payer: Self-pay

## 2021-11-07 DIAGNOSIS — I10 Essential (primary) hypertension: Secondary | ICD-10-CM | POA: Diagnosis not present

## 2021-11-07 DIAGNOSIS — C481 Malignant neoplasm of specified parts of peritoneum: Secondary | ICD-10-CM | POA: Diagnosis not present

## 2021-11-07 DIAGNOSIS — N179 Acute kidney failure, unspecified: Secondary | ICD-10-CM | POA: Diagnosis not present

## 2021-11-07 LAB — CBC
HCT: 28.1 % — ABNORMAL LOW (ref 36.0–46.0)
Hemoglobin: 10.2 g/dL — ABNORMAL LOW (ref 12.0–15.0)
MCH: 34.2 pg — ABNORMAL HIGH (ref 26.0–34.0)
MCHC: 36.3 g/dL — ABNORMAL HIGH (ref 30.0–36.0)
MCV: 94.3 fL (ref 80.0–100.0)
Platelets: 393 10*3/uL (ref 150–400)
RBC: 2.98 MIL/uL — ABNORMAL LOW (ref 3.87–5.11)
RDW: 11.9 % (ref 11.5–15.5)
WBC: 6.7 10*3/uL (ref 4.0–10.5)
nRBC: 0 % (ref 0.0–0.2)

## 2021-11-07 LAB — BASIC METABOLIC PANEL
Anion gap: 13 (ref 5–15)
BUN: 50 mg/dL — ABNORMAL HIGH (ref 8–23)
CO2: 28 mmol/L (ref 22–32)
Calcium: 8.2 mg/dL — ABNORMAL LOW (ref 8.9–10.3)
Chloride: 86 mmol/L — ABNORMAL LOW (ref 98–111)
Creatinine, Ser: 1.41 mg/dL — ABNORMAL HIGH (ref 0.44–1.00)
GFR, Estimated: 38 mL/min — ABNORMAL LOW (ref >60)
Glucose, Bld: 93 mg/dL (ref 70–99)
Potassium: 2.9 mmol/L — ABNORMAL LOW (ref 3.5–5.1)
Sodium: 127 mmol/L — ABNORMAL LOW (ref 135–145)

## 2021-11-07 LAB — OSMOLALITY: Osmolality: 283 mOsm/kg (ref 275–295)

## 2021-11-07 LAB — MAGNESIUM: Magnesium: 1.4 mg/dL — ABNORMAL LOW (ref 1.7–2.4)

## 2021-11-07 LAB — URIC ACID: Uric Acid, Serum: 9 mg/dL — ABNORMAL HIGH (ref 2.5–7.1)

## 2021-11-07 MED ORDER — MIDODRINE HCL 5 MG PO TABS
5.0000 mg | ORAL_TABLET | Freq: Three times a day (TID) | ORAL | Status: DC
  Administered 2021-11-07 – 2021-11-08 (×4): 5 mg via ORAL
  Filled 2021-11-07 (×4): qty 1

## 2021-11-07 MED ORDER — LACTATED RINGERS IV SOLN: INTRAVENOUS | Status: DC

## 2021-11-07 MED ORDER — POTASSIUM CHLORIDE CRYS ER 20 MEQ PO TBCR
40.0000 meq | EXTENDED_RELEASE_TABLET | Freq: Four times a day (QID) | ORAL | Status: AC
  Administered 2021-11-07 (×2): 40 meq via ORAL
  Filled 2021-11-07 (×2): qty 2

## 2021-11-07 MED ORDER — MAGNESIUM SULFATE 4 GM/100ML IV SOLN
4.0000 g | Freq: Once | INTRAVENOUS | Status: AC
  Administered 2021-11-07: 4 g via INTRAVENOUS
  Filled 2021-11-07: qty 100

## 2021-11-07 NOTE — Evaluation (Signed)
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   Cataract    Family history of colon cancer    Gastrostomy in place [facility])    LUQ - placed 10/16/2021   Hypertension    Ileostomy, has currently ([place])    placed 09/2021   SUI (stress urinary incontinence, female)    Thyroid disease    Urinary urgency    Past Surgical History:  Past Surgical History:  Procedure Laterality Date   BREAST LUMPECTOMY  1999   Chemo and radiation on right breast   LAPAROSCOPY N/A 10/16/2021   Procedure: LAPAROSCOPIC  DIVERTING OSTOMY, BIOSPY,  G-TUBE PLACEMENT;  Surgeon: Michael Boston, MD;  Location: [facility];  Service: General;  Laterality: N/A;   [place] & 2001   HPI:  Pt is a 80 yo female recently dx with stage IV extraovarian primary peritoneal carcinoma. She was recetently admitted 8/24 for colonic mass and impending obstruction and underwent laparascopic loop ileostomy, g-tube, and peritoneal biopsy on 8/25. She presented 9/14 with abnormal labs showing acute renal failure, dehydration, and hyperkalemia with acidosis. PMH also includes: Barrett's esophagus, breast ca, HTN, thyroid disease    Assessment / Plan / Recommendation  Clinical Impression  Pt's oropharyngeal swallow appears to be [facility] across breakfast meal. Pt and husband deny any subjective difficulties as well, but report primary barrier to intake being nausea. They are interested in speaking with a dietitian to maximize nutrition - MD informed. They also voice  concerns about coordinating all of her care and having someone to look "at the big picture" so also reached out about considering palliative care involvement. No acute SLP needs currently identified though so will sign off at this time. SLP Visit Diagnosis: Dysphagia, unspecified (R13.10)    Aspiration Risk  Mild aspiration risk    Diet Recommendation Regular;Thin liquid   Liquid Administration via: Cup;Straw Medication Administration: Whole meds with liquid Supervision: Patient able to self feed Postural Changes: Seated upright at 90 degrees;Remain upright for at least 30 minutes after po intake    Other  Recommendations Oral Care Recommendations: Oral care BID    Recommendations for follow up therapy are one component of a multi-disciplinary discharge planning process, led by the attending physician.  Recommendations may be updated based on patient status, additional functional criteria and insurance authorization.  Follow up Recommendations No SLP follow up      Assistance Recommended at Discharge PRN  Functional Status Assessment Patient has not had a recent decline in their functional status  Frequency and Duration            Prognosis Prognosis for Safe Diet Advancement: Good      Swallow Study   General HPI: Pt is a 81 yo female recently dx with stage IV extraovarian primary peritoneal carcinoma. She was recetently admitted 8/24 for colonic mass and impending obstruction and underwent laparascopic loop ileostomy, g-tube, and peritoneal biopsy on 8/25. She presented 9/14 with abnormal labs showing acute renal failure, dehydration, and hyperkalemia with  acidosis. PMH also includes: Barrett's esophagus, breast ca, HTN, thyroid disease Type of Study: Bedside Swallow Evaluation Previous Swallow Assessment: none in chart Diet Prior to this Study: Regular;Thin liquids Temperature Spikes Noted: No Respiratory Status: Room air History of Recent Intubation: No Behavior/Cognition:  Alert;Cooperative;Pleasant mood Oral Care Completed by SLP: No Oral Cavity - Dentition: Adequate natural dentition Vision: Functional for self-feeding Self-Feeding Abilities: Able to feed self Patient Positioning: Upright in bed Baseline Vocal Quality: Normal    Oral/Motor/Sensory Function     Ice Chips Ice chips: Not tested   Thin Liquid Thin Liquid: Within functional limits Presentation: Self Fed;Straw;Cup    Nectar Thick Nectar Thick Liquid: Not tested   Honey Thick Honey Thick Liquid: Not tested   Puree Puree: Within functional limits Presentation: Self Fed;Spoon   Solid     Solid: Within functional limits Presentation: Self Fed      Osie Bond., M.A. [place] Office [phone]  Secure chat preferred  11/07/2021,11:39 AM

## 2021-11-07 NOTE — Evaluation (Signed)
Occupational Therapy Evaluation Patient Details Name: Nancy Harrison MRN: [id number] DOB: Jul 16, 1940 Today's Date: 11/07/2021   History of Present Illness Pt is a 81 y/o F presenting to ED on 9/14 with abormal labs showing acute renal failure, dehydration, and hyperkalemia with acidosis.Of note, with recent dx of stage IV extraovarian primary peritoneal carcinoma and recent admission 8/24 for colonic mass where she underwent laparascopic loop ileostomy, g-tube, and peritoneal biopsy on 8/25.  PMH includes Barrett's esophagus, breast CA, HTN, and thyroid disease.   Clinical Impression   Pt reports independence at baseline with ADLs, spouse assists with IADLs, pt reports furniture walking and occasionally using RW for mobility. Pt currently needing supervision-minA for ADLs, supervision for bed mobility and transfers with RW. Pt fatiguing after short distance ambulation in hall and toilet transfer. Pt presenting with impairments listed below, will follow acutely. Recommend HHOT at d/c.     Recommendations for follow up therapy are one component of a multi-disciplinary discharge planning process, led by the attending physician.  Recommendations may be updated based on patient status, additional functional criteria and insurance authorization.   Follow Up Recommendations  Home health OT    Assistance Recommended at Discharge Intermittent Supervision/Assistance  Patient can return home with the following A little help with walking and/or transfers;A little help with bathing/dressing/bathroom;Assistance with cooking/housework;Direct supervision/assist for medications management;Direct supervision/assist for financial management;Assist for transportation;Help with stairs or ramp for entrance    Functional Status Assessment  Patient has had a recent decline in their functional status and demonstrates the ability to make significant improvements in function in a reasonable and predictable amount of  time.  Equipment Recommendations  None recommended by OT (pt has all needed DME)    Recommendations for Other Services PT consult     Precautions / Restrictions Precautions Precautions: Fall Restrictions Weight Bearing Restrictions: No      Mobility Bed Mobility Overal bed mobility: Needs Assistance Bed Mobility: Sit to Supine       Sit to supine: Supervision   General bed mobility comments: increased time    Transfers Overall transfer level: Needs assistance Equipment used: Rolling walker (2 wheels) Transfers: Sit to/from Stand Sit to Stand: Supervision                  Balance Overall balance assessment: Needs assistance Sitting-balance support: Feet supported Sitting balance-Leahy Scale: Good     Standing balance support: During functional activity, Reliant on assistive device for balance Standing balance-Leahy Scale: Fair Standing balance comment: static standing at sink without RW support                           ADL either performed or assessed with clinical judgement   ADL Overall ADL's : Needs assistance/impaired Eating/Feeding: Modified independent   Grooming: Oral care;Wash/dry hands;Wash/dry face;Modified independent;Standing Grooming Details (indicate cue type and reason): standing at sink Upper Body Bathing: Minimal assistance   Lower Body Bathing: Minimal assistance   Upper Body Dressing : Minimal assistance   Lower Body Dressing: Minimal assistance   Toilet Transfer: Supervision/safety;Rolling walker (2 wheels);Ambulation;Regular Toilet   Toileting- Water quality scientist and Hygiene: Supervision/safety       Functional mobility during ADLs: Supervision/safety;Rolling walker (2 wheels)       Vision   Vision Assessment?: No apparent visual deficits     Perception     Praxis      Pertinent Vitals/Pain Pain Assessment Pain Assessment: No/denies pain  Hand Dominance Right   Extremity/Trunk Assessment  Upper Extremity Assessment Upper Extremity Assessment: Generalized weakness   Lower Extremity Assessment Lower Extremity Assessment: Defer to PT evaluation   Cervical / Trunk Assessment Cervical / Trunk Assessment: Normal   Communication Communication Communication: No difficulties   Cognition Arousal/Alertness: Awake/alert Behavior During Therapy: WFL for tasks assessed/performed Overall Cognitive Status: Within Functional Limits for tasks assessed                                 General Comments: following commands appropriately throughout session, some slow initiation/processing, possibly due to fatigue     General Comments  VSS On RA, spouse at bedside    Exercises     Shoulder Instructions      Home Living Family/patient expects to be discharged to:: Private residence Living Arrangements: Spouse/significant other Available Help at Discharge: Family Type of Home: House Home Access: Stairs to enter Technical brewer of Steps: 6 Entrance Stairs-Rails: Right;Left Home Layout: One level;Laundry or work area in basement     [employer] Shower/Tub: Occupational psychologist: [place] Accessibility: Yes   Home Equipment: Grab bars - tub/shower;Rolling Environmental consultant (2 wheels);Shower seat          Prior Functioning/Environment Prior Level of Function : Independent/Modified Independent             Mobility Comments: furniture walks, and occasionally uses RW ADLs Comments: Pt reports independence with dressing/bathing, husband does the cooking and cleaning        OT Problem List: Decreased strength;Decreased range of motion;Decreased activity tolerance;Impaired balance (sitting and/or standing);Decreased coordination;Decreased safety awareness;Decreased knowledge of use of DME or AE;Cardiopulmonary status limiting activity      OT Treatment/Interventions: Self-care/ADL training;Therapeutic exercise;Energy conservation;DME and/or AE  instruction;Therapeutic activities;Cognitive remediation/compensation;Patient/family education;Balance training    OT Goals(Current goals can be found in the care plan section) Acute Rehab OT Goals Patient Stated Goal: none stated OT Goal Formulation: With patient Time For Goal Achievement: 11/21/21 Potential to Achieve Goals: Good ADL Goals Pt Will Perform Upper Body Dressing: with modified independence;sitting;standing Pt Will Perform Lower Body Dressing: with modified independence;sitting/lateral leans;sit to/from stand Pt Will Transfer to Toilet: with modified independence;ambulating;regular height toilet Pt Will Perform Tub/Shower Transfer: Shower transfer;ambulating;shower seat;rolling walker Additional ADL Goal #1: pt will be able to stand x10 mins for functional task in order to improve activity tolerance for ADLs  OT Frequency: Min 2X/week    Co-evaluation              AM-PAC OT "6 Clicks" Daily Activity     Outcome Measure Help from another person eating meals?: None Help from another person taking care of personal grooming?: A Little Help from another person toileting, which includes using toliet, bedpan, or urinal?: A Little Help from another person bathing (including washing, rinsing, drying)?: A Little Help from another person to put on and taking off regular upper body clothing?: A Little Help from another person to put on and taking off regular lower body clothing?: A Little 6 Click Score: 19   End of Session Equipment Utilized During Treatment: Rolling walker (2 wheels) Nurse Communication: Mobility status  Activity Tolerance: Patient tolerated treatment well Patient left: in bed;with call bell/phone within reach;with bed alarm set;with family/visitor present  OT Visit Diagnosis: Unsteadiness on feet (R26.81);Other abnormalities of gait and mobility (R26.89);Muscle weakness (generalized) (M62.81)  Time: 9628-3662 OT Time Calculation (min): 13  min Charges:  OT General Charges $OT Visit: 1 Visit OT Evaluation $OT Eval Low Complexity: 1 Low  Lynnda Child, OTD, OTR/L Acute Rehab [phone] - [place] 11/07/2021, 4:14 PM

## 2021-11-07 NOTE — Progress Notes (Signed)
  Transition of Care [facility]) Screening Note   Patient Details  Name: Nancy Harrison Date of Birth: [birth year]   Transition of Care [facility]) CM/SW Contact:    Benard Halsted, LCSW Phone Number: 11/07/2021, 8:51 AM    Transition of Care Department [facility]) has reviewed patient. We will continue to monitor patient advancement through interdisciplinary progression rounds. If new patient transition needs arise, please place a TOC consult.

## 2021-11-07 NOTE — Evaluation (Signed)
Physical Therapy Evaluation Patient Details Name: Nancy Harrison MRN: [id number] DOB: [birth year] Today's Date: 11/07/2021  History of Present Illness  Pt is a 81 y/o F presenting to ED on 9/14 with abormal labs showing acute renal failure, dehydration, and hyperkalemia with acidosis.Of note, with recent dx of stage IV extraovarian primary peritoneal carcinoma and recent admission 8/24 for colonic mass where she underwent laparascopic loop ileostomy, g-tube, and peritoneal biopsy on 8/25.  PMH includes Barrett's esophagus, breast CA, HTN, and thyroid disease.  Clinical Impression   Pt presents with min weakness, impaired balance, increased time and effort to mobilize, and decreased activity tolerance. Pt to benefit from acute PT to address deficits. Pt ambulated short hallway distance with use of RW and increased time, initially trialled gait without RW and pt was unsteady requiring PT assist to steady. Pt and family interested in HHPT.  PT to progress mobility as tolerated, and will continue to follow acutely.         Recommendations for follow up therapy are one component of a multi-disciplinary discharge planning process, led by the attending physician.  Recommendations may be updated based on patient status, additional functional criteria and insurance authorization.  Follow Up Recommendations Home health PT      Assistance Recommended at Discharge Intermittent Supervision/Assistance  Patient can return home with the following  A little help with walking and/or transfers;A little help with bathing/dressing/bathroom;Assist for transportation;Help with stairs or ramp for entrance;Assistance with cooking/housework    Equipment Recommendations None recommended by PT  Recommendations for Other Services       Functional Status Assessment Patient has had a recent decline in their functional status and demonstrates the ability to make significant improvements in function in a reasonable and  predictable amount of time.     Precautions / Restrictions Precautions Precautions: Fall Precaution Comments: Rt ostomy, ~1 fall in last 6 months Restrictions Weight Bearing Restrictions: No      Mobility  Bed Mobility Overal bed mobility: Needs Assistance Bed Mobility: Supine to Sit     Supine to sit: HOB elevated, Supervision     General bed mobility comments: for safety, use of bedrails    Transfers Overall transfer level: Needs assistance Equipment used: Rolling walker (2 wheels) Transfers: Sit to/from Stand Sit to Stand: Supervision                Ambulation/Gait Ambulation/Gait assistance: Supervision, Min assist Gait Distance (Feet): 100 Feet Assistive device: Rolling walker (2 wheels), None Gait Pattern/deviations: Step-through pattern, Decreased stride length, Trunk flexed Gait velocity: decr     General Gait Details: Initially pt requiring light physical assist to steady without AD and pt reaching for environment, PT placed pt on RW with improvement to supervision  Stairs            Wheelchair Mobility    Modified Rankin (Stroke Patients Only)       Balance Overall balance assessment: Needs assistance Sitting-balance support: Feet supported Sitting balance-Leahy Scale: Good     Standing balance support: During functional activity, Reliant on assistive device for balance Standing balance-Leahy Scale: Fair Standing balance comment: reliant on AD dynamically                             Pertinent Vitals/Pain Pain Assessment Pain Assessment: No/denies pain    Home Living Family/patient expects to be discharged to:: Private residence Living Arrangements: Spouse/significant other Available Help at Discharge: Family Type of  Home: House Home Access: Stairs to enter Entrance Stairs-Rails: Doctor, general practice of Steps: 6   Home Layout: One level;Laundry or work area in [employer] Equipment: Grab bars -  Chartered loss adjuster (2 wheels);Shower seat      Prior Function Prior Level of Function : Independent/Modified Independent             Mobility Comments: furniture walks, and occasionally uses RW ADLs Comments: Pt reports independence with dressing/bathing, husband does the cooking and cleaning     Hand Dominance   Dominant Hand: Right    Extremity/Trunk Assessment   Upper Extremity Assessment Upper Extremity Assessment: Defer to OT evaluation    Lower Extremity Assessment Lower Extremity Assessment: Generalized weakness    Cervical / Trunk Assessment Cervical / Trunk Assessment: Normal  Communication   Communication: No difficulties  Cognition Arousal/Alertness: Awake/alert Behavior During Therapy: WFL for tasks assessed/performed Overall Cognitive Status: Within Functional Limits for tasks assessed                                          General Comments General comments (skin integrity, edema, etc.): vss    Exercises     Assessment/Plan    PT Assessment Patient needs continued PT services  PT Problem List Decreased activity tolerance;Decreased balance;Decreased mobility;Decreased knowledge of use of DME;Decreased knowledge of precautions;Decreased strength       PT Treatment Interventions DME instruction;Gait training;Stair training;Functional mobility training;Therapeutic activities;Therapeutic exercise;Balance training;Patient/family education    PT Goals (Current goals can be found in the Care Plan section)  Acute Rehab PT Goals Patient Stated Goal: home PT Goal Formulation: With patient Time For Goal Achievement: 11/21/21 Potential to Achieve Goals: Good    Frequency Min 3X/week     Co-evaluation               AM-PAC PT "6 Clicks" Mobility  Outcome Measure Help needed turning from your back to your side while in a flat bed without using bedrails?: A Little Help needed moving from lying on your back to sitting on  the side of a flat bed without using bedrails?: A Little Help needed moving to and from a bed to a chair (including a wheelchair)?: A Little Help needed standing up from a chair using your arms (e.g., wheelchair or bedside chair)?: A Little Help needed to walk in hospital room?: A Little Help needed climbing 3-5 steps with a railing? : A Little 6 Click Score: 18    End of Session   Activity Tolerance: Patient tolerated treatment well Patient left: with family/visitor present;with call bell/phone within reach;Other (comment) (up with OT - dovetailed session) Nurse Communication: Mobility status PT Visit Diagnosis: Unsteadiness on feet (R26.81);Difficulty in walking, not elsewhere classified (R26.2);Muscle weakness (generalized) (M62.81)    Time: 6270-3500 PT Time Calculation (min) (ACUTE ONLY): 8 min   Charges:   PT Evaluation $PT Eval Low Complexity: 1 Low         Areon Cocuzza S, PT DPT Acute Rehabilitation Services Pager [phone]  Office [phone]   Truddie Coco 11/07/2021, 4:24 PM

## 2021-11-07 NOTE — Progress Notes (Signed)
PROGRESS NOTE                                                                                                                                                                                                             Patient Demographics:    Nancy Harrison, is a 81 y.o. female, DOB - November 02, 1940, HKG:[id number]  Outpatient Primary MD for the patient is Kristen Loader, FNP    LOS - 1  Admit date - 11/05/2021    Chief Complaint  Patient presents with   Emesis       Brief Narrative (HPI from H&P)   81 y.o. female with medical history significant of  significant of breast cancer, anxiety, hypertension, hyperlipidemia, GERD, goiter and recent diagnosis of stage IV extraovarian primary peritoneal carcinoma. Was admitted on 10/15/21 for colonic mass and impending colonic obstruction. S/p laparoscopic loop ileostomy, G-tube and peritoneal biopsy on 8/25 by Dr. Johney Maine. She came to ED due to abnormal labs from oncology showing acute renal failure, dehydration and hyperkalemia with acidosis. She states she has had N/V for the past 3-4 days. After her surgery she was not eating much or drinking much. She also has had high volume output in her ostomy bag. She also has not urinated in nearly 2 days by her to hospital admission.  She was diagnosed with severe dehydration, hyponatremia, AKI and admitted to the hospital.   Subjective:    Nancy Harrison today has, No headache, No chest pain, No abdominal pain - No Nausea, No new weakness tingling or numbness, no SOB   Assessment  & Plan :   ARF (acute renal failure) (HCC) with severe dehydration, hyponatremia, hypokalemia and hypomagnesemia - 81 year old female recently diagnosed with extraovarian primary peritoneal carcinoma who is s/p ileostomy and PEG tube placement on 8/25 who has had poor PO intake and 3-4 day history of N/V and high output ostomy found to be in acute renal failure.  This all  appears to be prerenal with severe dehydration, improving with IV fluids, renal ultrasound nonacute.  Case discussed with nephrology Dr. Joylene Grapes.  Continue to hydrate and monitor.  Added midodrine to augment her blood pressure.  Potassium and magnesium replaced.  AKI and acidosis improving.  Extraovarian primary peritoneal carcinoma ([place]) -CT abdomen/pelvis 10/09/2021-irregular mass in the distal transverse colon with dilation of the transverse  and right colon, and distal small bowel.  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  Bone lesions concerning for metastases, right lung nodule. Laparoscopy, diverting loop ileostomy, gastrostomy tube placement, peritoneal biopsy 10/16/2021, no evidence of a primary tumor site at the appendix, ovaries, or uterus.  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  She already has an appointment upcoming with oncologist Dr. Benay Spice for further treatment which will be kept.  Motive care for nausea vomiting and high ostomy output.   Essential hypertension Hold all anti-HTN meds with hypotension and AKI currently on midodrine.  Ileostomy care [facility]) - Ostomy nurse consult, Output appears normal in ostomy bag, Monitor output, Imodium prn   Gastrostomy tube in place [facility]) - Peg tube care, patient and family will be provided PEG tube care today.  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 Lumpectomy and adjuvant CMF x8 followed by radiation      Condition - Extremely Guarded  Family Communication  : Husband bedside on 11/07/2021  Code Status : Full code  Consults  : None  PUD Prophylaxis :    Procedures  :            Disposition Plan  :    Status is: Inpatient  DVT Prophylaxis  :    heparin injection 5,000 Units Start: 11/06/21  2200    Lab Results  Component Value Date   PLT 393 11/07/2021    Diet :  Diet Order             Diet regular Room service appropriate? Yes; Fluid consistency: Thin  Diet effective now                    Inpatient Medications  Scheduled Meds:  heparin  5,000 Units Subcutaneous Q8H   midodrine  5 mg Oral TID WC   potassium chloride  40 mEq Oral Q6H   Continuous Infusions:  lactated ringers 100 mL/hr at 11/07/21 6962   magnesium sulfate bolus IVPB     PRN Meds:.acetaminophen, loperamide, methocarbamol, ondansetron **OR** ondansetron (ZOFRAN) IV  Antibiotics  :    Anti-infectives (From admission, onward)    None        Time Spent in minutes  30   Lala Lund M.D on 11/07/2021 at 9:55 AM  To page go to [email or web address]   [facility] -  Office  [phone]  See all Orders from today for further details    Objective:   Vitals:   11/06/21 1637 11/06/21 2015 11/06/21 2340 11/07/21 0340  BP: 113/68 114/69 105/60 98/63  Pulse: 92 90 90 92  Resp: 18 20 (!) 22 20  Temp:  98.2 F (36.8 C) 98.3 F (36.8 C) 98.4 F (36.9 C)  TempSrc:  Temporal Temporal Oral  SpO2: 99% 98% 98% 100%  Weight:      Height:        Wt Readings from Last 3 Encounters:  11/05/21 53.6 kg  11/02/21 53.6 kg  10/19/21 62.3 kg     Intake/Output Summary (Last 24 hours) at 11/07/2021 0955 Last data filed at 11/07/2021 0548 Gross per 24 hour  Intake 3690 ml  Output 525 ml  Net 3165 ml     Physical Exam  Awake Alert, No new F.N deficits, Normal affect [place].AT,PERRAL Supple Neck, No JVD,   Symmetrical Chest wall movement,  Good air movement bilaterally, CTAB RRR,No Gallops,Rubs or new Murmurs,  +ve B.Sounds, Abd Soft, No tenderness,   No Cyanosis, Clubbing or edema      Data Review:    CBC Recent Labs  Lab 11/05/21 1217 11/05/21 1808 11/07/21 0750  WBC 7.6  --  6.7  HGB 12.4 10.2* 10.2*  HCT 37.9 30.0* 28.1*  PLT 514*  --  393  MCV 103.0*  --  94.3   MCH 33.7  --  34.2*  MCHC 32.7  --  36.3*  RDW 12.1  --  11.9  LYMPHSABS 0.9  --   --   MONOABS 0.6  --   --   EOSABS 0.0  --   --   BASOSABS 0.1  --   --     Electrolytes Recent Labs  Lab 11/05/21 1447 11/05/21 1806 11/05/21 1808 11/06/21 0516 11/06/21 2124 11/07/21 0750  NA 125* 122* 121* 125* 128* 127*  K 6.2* 5.0 5.1 3.4* 3.1* 2.9*  CL 96* 91*  --  91* 87* 86*  CO2 <7* 12*  --  19* 25 28  GLUCOSE 87 94  --  153* 115* 93  BUN 81* 77*  --  74* 59* 50*  CREATININE 5.74* 5.33*  --  3.87* 2.12* 1.41*  CALCIUM 10.0 9.6  --  8.3* 8.4* 8.2*  AST 31  --   --   --   --   --   ALT 14  --   --   --   --   --   ALKPHOS 43  --   --   --   --   --   BILITOT 0.5  --   --   --   --   --   ALBUMIN 4.0  --   --  3.3*  --   --   MG 2.5*  --   --   --   --  1.4*    Radiology Reports US Renal  Result Date: 11/05/2021 CLINICAL DATA:  Acute kidney injury EXAM: RENAL / URINARY TRACT ULTRASOUND COMPLETE COMPARISON:  None Available. FINDINGS: Right Kidney: Renal measurements: 8.9 x 4.3 x 3.8 cm = volume: 76.4 mL. Echogenicity within normal limits. No mass or hydronephrosis visualized. Left Kidney: Renal measurements: 10.2 x 5.8 x 6.0 cm = volume: 186.8 mL. Echogenicity within normal limits. No mass or hydronephrosis visualized. Bladder: Decompressed. Other: None. IMPRESSION: No hydronephrosis. Electronically Signed   By: Maurine Simmering M.D.   On: 11/05/2021 19:59

## 2021-11-08 ENCOUNTER — Encounter: Payer: Self-pay | Admitting: Oncology

## 2021-11-08 ENCOUNTER — Encounter: Payer: Self-pay | Admitting: Gynecologic Oncology

## 2021-11-08 DIAGNOSIS — N179 Acute kidney failure, unspecified: Secondary | ICD-10-CM | POA: Diagnosis not present

## 2021-11-08 DIAGNOSIS — Z931 Gastrostomy status: Secondary | ICD-10-CM | POA: Diagnosis not present

## 2021-11-08 DIAGNOSIS — C481 Malignant neoplasm of specified parts of peritoneum: Secondary | ICD-10-CM | POA: Diagnosis not present

## 2021-11-08 DIAGNOSIS — I10 Essential (primary) hypertension: Secondary | ICD-10-CM | POA: Diagnosis not present

## 2021-11-08 LAB — CBC WITH DIFFERENTIAL/PLATELET
Abs Immature Granulocytes: 0.03 10*3/uL (ref 0.00–0.07)
Basophils Absolute: 0.1 10*3/uL (ref 0.0–0.1)
Basophils Relative: 2 %
Eosinophils Absolute: 0.4 10*3/uL (ref 0.0–0.5)
Eosinophils Relative: 7 %
HCT: 28.1 % — ABNORMAL LOW (ref 36.0–46.0)
Hemoglobin: 10.1 g/dL — ABNORMAL LOW (ref 12.0–15.0)
Immature Granulocytes: 1 %
Lymphocytes Relative: 21 %
Lymphs Abs: 1.4 10*3/uL (ref 0.7–4.0)
MCH: 34.6 pg — ABNORMAL HIGH (ref 26.0–34.0)
MCHC: 35.9 g/dL (ref 30.0–36.0)
MCV: 96.2 fL (ref 80.0–100.0)
Monocytes Absolute: 0.8 10*3/uL (ref 0.1–1.0)
Monocytes Relative: 12 %
Neutro Abs: 3.8 10*3/uL (ref 1.7–7.7)
Neutrophils Relative %: 57 %
Platelets: 376 10*3/uL (ref 150–400)
RBC: 2.92 MIL/uL — ABNORMAL LOW (ref 3.87–5.11)
RDW: 11.9 % (ref 11.5–15.5)
WBC: 6.5 10*3/uL (ref 4.0–10.5)
nRBC: 0 % (ref 0.0–0.2)

## 2021-11-08 LAB — BASIC METABOLIC PANEL
Anion gap: 9 (ref 5–15)
BUN: 38 mg/dL — ABNORMAL HIGH (ref 8–23)
CO2: 27 mmol/L (ref 22–32)
Calcium: 8.5 mg/dL — ABNORMAL LOW (ref 8.9–10.3)
Chloride: 94 mmol/L — ABNORMAL LOW (ref 98–111)
Creatinine, Ser: 1.11 mg/dL — ABNORMAL HIGH (ref 0.44–1.00)
GFR, Estimated: 51 mL/min — ABNORMAL LOW (ref >60)
Glucose, Bld: 85 mg/dL (ref 70–99)
Potassium: 4.4 mmol/L (ref 3.5–5.1)
Sodium: 130 mmol/L — ABNORMAL LOW (ref 135–145)

## 2021-11-08 LAB — MAGNESIUM: Magnesium: 2.3 mg/dL (ref 1.7–2.4)

## 2021-11-08 MED ORDER — ONDANSETRON HCL 4 MG PO TABS: 4.0000 mg | ORAL_TABLET | Freq: Three times a day (TID) | ORAL | 0 refills | Status: DC | PRN

## 2021-11-08 MED ORDER — LOPERAMIDE HCL 2 MG PO TABS: 4.0000 mg | ORAL_TABLET | Freq: Four times a day (QID) | ORAL | 0 refills | Status: DC | PRN

## 2021-11-08 MED ORDER — DIPHENOXYLATE-ATROPINE 2.5-0.025 MG PO TABS: 1.0000 | ORAL_TABLET | Freq: Two times a day (BID) | ORAL | 0 refills | Status: DC | PRN

## 2021-11-08 NOTE — Progress Notes (Signed)
Patient transported off unit in wheelchair by nurse tech.  Husband by patient's side with discharge paperwork and prescriptions in discharge folder.  These were discussed with patient and husband when going over discharge.  All patient belongings were with patient.

## 2021-11-08 NOTE — TOC Transition Note (Signed)
Transition of Care [facility]) - CM/SW Discharge Note   Patient Details  Name: Nancy Harrison MRN: [id number] Date of Birth: 1940/04/23  Transition of Care [facility]) CM/SW Contact:  Carles Collet, RN Phone Number: 11/08/2021, 9:28 AM   Clinical Narrative:     Damaris Schooner w patient at bedside. She states she has RW at home.  She is active with [facility]. They have been notified of DC for resumption of services. No other TOC needs identified.   Final next level of care: [place] Barriers to Discharge: No Barriers Identified   Patient Goals and CMS Choice Patient states their goals for this hospitalization and ongoing recovery are:: return home CMS Medicare.gov Compare Post Acute Care list provided to:: Patient Choice offered to / list presented to : Patient  Discharge Placement                       Discharge Plan and Services                            [facility] Agency: [place] Date [place]: 11/08/21 Time [place]: [phone] Representative spoke with at [place]: [place]) Interventions     Readmission Risk Interventions     No data to display

## 2021-11-08 NOTE — Discharge Instructions (Addendum)
Follow with Primary MD Kristen Loader, FNP in 3 days   Get CBC, CMP, Magnesium -  checked next visit within 2-3 days Primary MD   Activity: As tolerated with Full fall precautions use walker/cane & assistance as needed  Disposition Home   Diet: Heart Healthy    Special Instructions: If you have smoked or chewed Tobacco  in the last 2 yrs please stop smoking, stop any regular Alcohol  and or any Recreational drug use.  On your next visit with your primary care physician please Get Medicines reviewed and adjusted.  Please request your Prim.MD to go over all Hospital Tests and Procedure/Radiological results at the follow up, please get all Hospital records sent to your Prim MD by signing hospital release before you go home.  If you experience worsening of your admission symptoms, develop shortness of breath, life threatening emergency, suicidal or homicidal thoughts you must seek medical attention immediately by calling 911 or calling your MD immediately  if symptoms less severe.  You Must read complete instructions/literature along with all the possible adverse reactions/side effects for all the Medicines you take and that have been prescribed to you. Take any new Medicines after you have completely understood and accpet all the possible adverse reactions/side effects.       High Calorie, High Protein Nutrition Therapy  A high-calorie, high-protein diet has been recommended to you. Your registered dietitian nutritionist (RDN) may have recommended this diet because you are having difficulty eating enough calories throughout the day, you have lost weight, and/or you need to add protein to your diet.  Sometimes you may not feel like eating, even if you know the importance of good nutrition. The recommendations in this handout can help you with the following:  Regaining your strength and energy Keeping your body healthy Healing and recovering from surgery or illness and fighting  infection  Schedule Your Meals and Snacks  Several small meals and snacks are often better tolerated and digested than large meals.  Strategies  Plan to eat 3 meals and 3 snacks daily. Experiment with timing meals to find out when you have a larger appetite. Appetite may be greatest in the morning after not eating all night so you may prefer to eat your larger meals and snacks in the morning and at lunch. Breakfast-type foods are often better tolerated so eat foods such as eggs, pancakes, waffles and cereal for any meal or snack. Carry snacks with you so you are prepared to eat every 2 to 3 hours. Determine what works best for you if your body's cues for feeling hungry or full are not working. Eat a small meal or snack even if you don't feel hungry. Set a timer to remind you when it is time to eat. Take a walk before you eat (with health care provider's approval). Light or moderate physical activity can help you maintain muscle and increase your appetite.  Make Eating Enjoyable  Taking steps to make the experience enjoyable may help to increase your interest in eating and improve your appetite.  Strategies:  Eat with others whenever possible. Include your favorite foods to make meals more enjoyable. Try new foods. Save your beverage for the end of the meal so that you have more room for food before you get full.  Add Calories to Your Meals and Snacks  Try adding calorie-dense foods so that each bite provides more nutrition.  Strategies  Drink milk, chocolate milk, soy milk, or smoothies instead of low-calorie beverages  such as diet drinks or water. Cook with milk or soy milk instead of water when making dishes such as hot cereal, cocoa, or pudding. Add jelly, jam, honey, butter or margarine to bread and crackers. Add jam or fruit to ice cream and as a topping over cake. Mix dried fruit, nuts, granola, honey, or dry cereal with yogurt or hot cereals. Enjoy snacks such as  milkshakes, smoothies, pudding, ice cream, or custard. Blend a fruit smoothie of a banana, frozen berries, milk or soy milk, and 1 tablespoon nonfat powdered milk or protein powder.  Add Protein to Your Meals and Snacks  Choose at least one protein food at each meal and snack to increase your daily intake.  Strategies  Add  cup nonfat dry milk powder or protein powder to make a high-protein milk to drink or to use in recipes that call for milk. Vanilla or peppermint extract or unsweetened cocoa powder could help to boost the flavor. Add hard-cooked eggs, leftover meat, grated cheese, canned beans or tofu to noodles, rice, salads, sandwiches, soups, casseroles, pasta, tuna and other mixed dishes. Add powdered milk or protein powder to hot cereals, meatloaf, casseroles, scrambled eggs, sauces, cream soups, and shakes. Add beans and lentils to salads, soups, casseroles, and vegetable dishes. Eat cottage cheese or yogurt, especially Greek yogurt, with fruit as a snack or dessert. Eat peanut or other nut butters on crackers, bread, toast, waffles, apples, bananas or celery sticks. Add it to milkshakes, smoothies, or desserts. Consider a ready-made protein shake.   Add Fats to Your Meals and Snacks  Try adding fats to your meals and snacks. Fat provides more calories in fewer bites than carbohydrate or protein and adds flavors to your foods.  Strategies  Snack on nuts and seeds or add them to foods like salads, pasta, cereals, yogurt, and ice cream.  Saut or stir-fry vegetables, meats, chicken, fish or tofu in olive or canola oil.  Add olive oil, other vegetable oils, butter or margarine to soups, vegetables, potatoes, cooked cereal, rice, pasta, bread, crackers, pancakes, or waffles. Snack on olives or add to pasta, pizza, or salad. Add avocado or guacamole to your salads, sandwiches, and other entrees. Include fatty fish such as salmon in your weekly meal plan.  Tips For Adding Protein  Nutrition Therapy    Tips Add extra egg to one or more meals  Increase the portion of milk to drink and change to skim milk if able  Include [place] yogurt or cottage cheese for snack or part of a meal  Increase portion size of protein entre and decrease portion of starch/bread  Mix protein powder, nut butter, almond/nut milk, non-fat dry milk, or Greek yogurt to shakes and smoothies  Use these ingredients also in baked goods or other recipes Use double the amount of sandwich filling  Add protein foods to all snacks including cheese, nut butters, milk and yogurt  Food Tips for Including Protein  Beans Cook and use dried peas, beans, and tofu in soups or add to casseroles, pastas, and grain dishes that also contain cheese or meat  Mash with cheese and milk  Use tofu to make smoothies  Commercial Protein Supplements Use nutritional supplements or protein powder sold at pharmacies and grocery stores  Use protein powder in milk drinks and desserts, such as pudding  Mix with ice cream, milk, and fruit or other flavorings for a high-protein milkshake  Cottage Cheese or [employer] Mix with or use to stuff fruits and vegetables  Add to casseroles, spaghetti, noodles, or egg dishes such as omelets, scrambled eggs, and souffls  Use gelatin, pudding-type desserts, cheesecake, and pancake or waffle batter  Use to stuff crepes, pasta shells, or manicotti  Puree and use as a substitute for sour cream  Eggs, Egg whites, and Egg Yolks Add chopped, hard-cooked eggs to salads and dressings, vegetables, casseroles, and creamed meats  Beat eggs into mashed potatoes, vegetable purees, and sauces  Add extra egg whites to quiches, scrambled eggs, custards, puddings, pancake batter, or [place] toast wash/batter  Make a rich custard with egg yolks, double strength milk, and sugar  Add extra hard-cooked yolks to deviled egg filling and sandwich spreads  Hard or Semi-Soft Cheese (Cheddar, Barnabas Lister, [place]) Melt on  sandwiches, bread, muffins, tortillas, hamburgers, hot dogs, other meats or fish, vegetables, eggs, or desserts such as stewed figs or pies  Grate and add to soups, sauces, casseroles, vegetable dishes, potatoes, rice noodles, or meatloaf  Serve as a snack with crackers or bagels  Ice cream, Yogurt, and Frozen Yogurt Add to milk drinks such as milkshakes  Add to cereals, fruits, gelatin desserts, and pies  Blend or whip with soft or cooked fruits  Sandwich ice cream or frozen yogurt between enriched cake slices, cookies, or graham crackers  Use seasoned yogurt as a dip for fruits, vegetables, or chips  Use yogurt in place of sour cream in casseroles  Meat and Fish Add chopped, cooked meat or fish to vegetables, salads, casseroles, soups, sauces, and biscuit dough  Use in omelets, souffls, quiches, and sandwich fillings  Add chicken and [place] to stuffing  Wrap in pie crust or biscuit dough as turnovers  Add to stuffed baked potatoes  Add pureed meat to soups  Milk Use in beverages and in cooking  Use in preparing foods, such as hot cereal, soups, cocoa, or pudding  Add cream sauces to vegetable and other dishes  Use evaporated milk, evaporated skim milk, or sweetened condensed milk instead of milk or water in recipes.  Nonfat Dry Milk Add 1/3 cup of nonfat dry milk powdered milk to each cup of regular milk for "double strength" milk  Add to yogurt and milk drinks, such as pasteurized eggnog and milkshakes  Add to scrambled eggs and mashed potatoes  Use in casseroles, meatloaf, hot cereal, breads, muffins, sauces, cream soups, puddings and custards, and other milk-based desserts  Nuts, Seeds, and Wheat Germ Add to casseroles, breads, muffins, pancakes, cookies, and waffles  Sprinkle on fruit, cereal, ice cream, yogurt, vegetables, salads, and toast as a crunchy topping  Use in place of breadcrumbs  Blend with parsley or spinach, herbs, and cream for a noodle, pasta, or vegetable sauce.   Roll banana in chopped nuts  Peanut Butter Spread on sandwiches, toast, muffins, crackers, waffles, pancakes, and fruit slices  Use as a dip for raw vegetables, such as carrots, cauliflower, and celery  Blend with milk drinks, smoothies, and other beverages  Swirl through soft ice cream or yogurt  Spread on a banana then roll in crushed, dry cereal or chopped nuts   Small Meal and Snack Ideas  These snacks and meals are recommended when you have to eat but aren't necessarily hungry.  They are good choices because they are high in protein and high in calories.   2 graham crackers, 2 tablespoons peanut or other nut butter, 1 cup milk  cup [place] yogurt,  cup fruit,  cup granola 2 deviled egg halves, 5 whole wheat crackers  1 cup cream of tomato soup,  grilled cheese sandwich 1 toasted waffle topped with: 2 tablespoons peanut or nut butter, 1 tablespoon jam Trail mix made with:  cup nuts,  cup dried fruit,  cup cold cereal, any variety  cup oatmeal or cream of wheat cereal, 1 tablespoon peanut or nut butter,  cup diced fruit  High-Calorie, High-Protein Sample 1-Day Menu   Breakfast 1 egg, scrambled 1 ounce cheddar cheese 1 English muffin, whole wheat 1 tablespoon margarine 1 tablespoon jam  cup orange juice, fortified with calcium and vitamin D  Morning Snack 1 tablespoon peanut butter 1 banana 1 cup 1% milk  Lunch Tuna salad sandwich made with: 2 slices bread, whole wheat 3 ounces tuna mixed with: 1 tablespoon mayonnaise  cup pudding  Afternoon Snack  cup hummus  cup carrots 1 pita  Evening Meal Enchilada casserole made with: 2 corn tortillas 3 ounces ground beef, cooked  cup black beans, cooked  cup corn, cooked 1 ounce grated cheddar cheese  cup enchilada sauce  avocado, sliced, topping for enchilada 1 tablespoon sour cream, topping for enchilada Salad:  cup lettuce, shredded  cup tomatoes, chopped, for salad 1 tablespoon olive oil and  vinegar dressing, for salad  Evening Snack  cup Greek yogurt  cup blueberries  cup granola  Copyright 2020  Academy of Nutrition and Dietetics. All rights reserved    [facility] Stay Proper nutrition can help your body recover from illness and injury.   Foods and beverages high in protein, vitamins, and minerals help rebuild muscle loss, promote healing, & reduce fall risk.   In addition to eating healthy foods, a nutrition shake is an easy, delicious way to get the nutrition you need during and after your hospital stay  It is recommended that you continue to drink 2 bottles per day of: Ensure Complete or similar for at least 1 month (30 days) after your hospital stay   Tips for adding a nutrition shake into your routine: As allowed, drink one with vitamins or medications instead of water or juice Enjoy one as a tasty mid-morning or afternoon snack Drink cold or make a milkshake out of it Drink one instead of milk with cereal or snacks Use as a coffee creamer   Available at the following grocery stores and pharmacies:           * [facility] [phone]            For COUPONS visit: [email or web address]/join or [email or web address]   Suggested Substitutions Ensure Plus = Boost Plus = Carnation Breakfast Essentials = Boost Compact Ensure Active Clear = Boost Breeze Glucerna Shake = Boost Glucose Control = Carnation Breakfast Essentials SUGAR FREE

## 2021-11-08 NOTE — Progress Notes (Signed)
Patient and Husband advised do not need teaching and education on flushing G Tube.  I did send two syringes home with them. They advised they have been flushing the tube for the past two weeks appropriately.

## 2021-11-08 NOTE — Progress Notes (Signed)
NUTRITION NOTE RD working remotely.  Consult received for diet education. Discharge order and discharge summary have been entered.   Patient has venting G-tube which was placed on 8/25. She is ordered a Regular diet; no intakes documented this admission.  Added to the AVS: High Calorie, High Protein handout from the Academy of Nutrition and Dietetics and smart phrase encouraging intake of Ensure Complete (or similar) BID for at least 1 month after discharge.  If patient unable to d/c today, RD will assess patient in person next available date.      Jarome Matin, MS, RD, LDN, [place] Registered Dietitian II Inpatient Clinical Nutrition RD pager # and on-call/weekend pager # available in [facility]

## 2021-11-08 NOTE — Plan of Care (Signed)
  Problem: Education: Goal: Knowledge of General Education information will improve Description: Including pain rating scale, medication(s)/side effects and non-pharmacologic comfort measures Outcome: Completed/Met

## 2021-11-08 NOTE — Discharge Summary (Signed)
Nancy Harrison BPZ:[id number] DOB: [birth year] DOA: 11/05/2021  PCP: Kristen Loader, FNP  Admit date: 11/05/2021  Discharge date: 11/08/2021  Admitted From: Home   Disposition:  Home   Recommendations for Outpatient Follow-up:   Follow up with PCP in 1-2 weeks  PCP Please obtain BMP/CBC, 2 view CXR in 1week,  (see Discharge instructions)   PCP Please follow up on the following pending results:    Home Health: PT, OT   Equipment/Devices: as below  Consultations: None  Discharge Condition: Stable    CODE STATUS: Full    Diet Recommendation: Heart Healthy   Diet Order             Diet regular Room service appropriate? Yes; Fluid consistency: Thin  Diet effective now                    Chief Complaint  Patient presents with   Emesis     Brief history of present illness from the day of admission and additional interim summary      81 y.o. female with medical history significant of  significant of breast cancer, anxiety, hypertension, hyperlipidemia, GERD, goiter and recent diagnosis of stage IV extraovarian primary peritoneal carcinoma. Was admitted on 10/15/21 for colonic mass and impending colonic obstruction. S/p laparoscopic loop ileostomy, G-tube and peritoneal biopsy on 8/25 by Dr. Johney Maine. She came to ED due to abnormal labs from oncology showing acute renal failure, dehydration and hyperkalemia with acidosis. She states she has had N/V for the past 3-4 days. After her surgery she was not eating much or drinking much. She also has had high volume output in her ostomy bag. She also has not urinated in nearly 2 days by her to hospital admission.  She was diagnosed with severe dehydration, hyponatremia, AKI and admitted to the hospital.                                                                  Hospital Course    ARF (acute renal failure) ([place]) with severe dehydration, hyponatremia, hypokalemia and hypomagnesemia - 81 year old female recently diagnosed with extraovarian primary peritoneal carcinoma who is s/p ileostomy and PEG tube placement on 8/25 who has had poor PO intake and 3-4 day history of N/V and high output ostomy found to be in acute renal failure.  This all appears to be prerenal with severe dehydration, improved with IV fluids, renal ultrasound nonacute.  Calcium magnesium replaced and stable, her case was discussed with nephrology Dr. Joylene Grapes who agreed with the plan.  With hydration and electrolyte replacement she is now back to baseline and symptom-free eager to go home.  Will be sent home with supportive care which will include Zofran for nausea along with antimotility agents for high ostomy output.  Extraovarian primary peritoneal carcinoma ([place]) -CT abdomen/pelvis 10/09/2021-irregular mass in the distal transverse colon with dilation of the transverse and right colon, and distal small bowel.  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  Bone lesions concerning for metastases, right lung nodule. Laparoscopy, diverting loop ileostomy, gastrostomy tube placement, peritoneal biopsy 10/16/2021, no evidence of a primary tumor site at the appendix, ovaries, or uterus.  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  She already has an appointment upcoming with oncologist Dr. Benay Spice on coming Tuesday which is in 2 days for further treatment which will be kept.     Essential hypertension pressure was soft taken off of blood pressure medications which she was holding as it is for several weeks.   Ileostomy care [facility]) - Ostomy nurse consult, Output appears normal in ostomy bag, Monitor output, Imodium and Lomotil prn    Gastrostomy tube  in place [facility]) - Peg tube care, patient and family will be provided PEG tube care today.   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 Lumpectomy and adjuvant CMF x8 followed by radiation   Discharge diagnosis     Principal Problem:   ARF (acute renal failure) (H[facility]) Active Problems:   Extraovarian primary peritoneal carcinoma (H[facility])   Nausea & vomiting   Hyponatremia   Essential hypertension   Ileostomy care ([place])   Gastrostomy tube in place [facility])   History of breast cancer    Discharge instructions    Discharge Instructions     Discharge instructions   Complete by: As directed    Follow with Primary MD Kristen Loader, FNP in 3 days   Get CBC, CMP, Magnesium -  checked next visit within 2-3 days Primary MD   Activity: As tolerated with Full fall precautions use walker/cane & assistance as needed  Disposition Home   Diet: Heart Healthy    Special Instructions: If you have smoked or chewed Tobacco  in the last 2 yrs please stop smoking, stop any regular Alcohol  and or any Recreational drug use.  On your next visit with your primary care physician please Get Medicines reviewed and adjusted.  Please request your Prim.MD to go over all Hospital Tests and Procedure/Radiological results at the follow up, please get all Hospital records sent to your Prim MD by signing hospital release before you go home.  If you experience worsening of your admission symptoms, develop shortness of breath, life threatening emergency, suicidal or homicidal thoughts you must seek medical attention immediately by calling 911 or calling your MD immediately  if symptoms less severe.  You Must read complete instructions/literature along with all the possible adverse reactions/side effects for all the Medicines you take and that have been prescribed to you. Take any new Medicines after you have completely understood and accpet all  the possible adverse reactions/side effects.   Increase activity slowly   Complete by: As directed    No wound care   Complete by: As directed        Discharge Medications   Allergies as of 11/08/2021       Reactions   Sporanox [itraconazole] Rash        Medication List     STOP taking these medications    bisoprolol-hydrochlorothiazide 10-6.25 MG tablet Commonly known as: ZIAC   lisinopril 10 MG tablet Commonly known as: ZESTRIL  TAKE these medications    acetaminophen 325 MG tablet Commonly known as: TYLENOL Take 2 tablets (650 mg total) by mouth every 6 (six) hours as needed for mild pain or fever.   diphenoxylate-atropine 2.5-0.025 MG tablet Commonly known as: Lomotil Take 1 tablet by mouth every 12 (twelve) hours as needed for diarrhea or loose stools.   loperamide 2 MG tablet Commonly known as: IMODIUM A-D Take 2 tablets (4 mg total) by mouth 4 (four) times daily as needed for diarrhea or loose stools. What changed: how much to take   loratadine 10 MG tablet Commonly known as: CLARITIN Take 10 mg by mouth 2 (two) times daily.   methocarbamol 500 MG tablet Commonly known as: ROBAXIN Take 1 tablet (500 mg total) by mouth every 6 (six) hours as needed for muscle spasms.   MULTIVITAMIN PO Take 1 tablet by mouth daily with breakfast.   ondansetron 4 MG tablet Commonly known as: Zofran Take 1 tablet (4 mg total) by mouth every 8 (eight) hours as needed for nausea or vomiting.               Durable Medical Equipment  (From admission, onward)           Start     Ordered   11/08/21 0843  For home use only DME Walker rolling  Once       Comments: 5 wheel  Question Answer Comment  Walker: With 5 Inch Wheels   Patient needs a walker to treat with the following condition Weakness      11/08/21 0842             Follow-up Information     Kristen Loader, FNP. Schedule an appointment as soon as possible for a visit in 3 day(s).    Specialty: Family Medicine Why: Please keep your appointment with Dr. Benay Spice as before Contact information: [place] [phone]                 Major procedures and Radiology Reports - PLEASE review detailed and final reports thoroughly  -      US Renal  Result Date: 11/05/2021 CLINICAL DATA:  Acute kidney injury EXAM: RENAL / URINARY TRACT ULTRASOUND COMPLETE COMPARISON:  None Available. FINDINGS: Right Kidney: Renal measurements: 8.9 x 4.3 x 3.8 cm = volume: 76.4 mL. Echogenicity within normal limits. No mass or hydronephrosis visualized. Left Kidney: Renal measurements: 10.2 x 5.8 x 6.0 cm = volume: 186.8 mL. Echogenicity within normal limits. No mass or hydronephrosis visualized. Bladder: Decompressed. Other: None. IMPRESSION: No hydronephrosis. Electronically Signed   By: Maurine Simmering M.D.   On: 11/05/2021 19:59   CT Chest W Contrast  Result Date: 11/04/2021 CLINICAL DATA:  Gentle cancer, peritoneal carcinomatosis, staging. Cough. * Tracking Code: BO * EXAM: CT CHEST WITH CONTRAST TECHNIQUE: Multidetector CT imaging of the chest was performed during intravenous contrast administration. RADIATION DOSE REDUCTION: This exam was performed according to the departmental dose-optimization program which includes automated exposure control, adjustment of the mA and/or kV according to patient size and/or use of iterative reconstruction technique. CONTRAST:  2m OMNIPAQUE IOHEXOL 300 MG/ML  SOLN COMPARISON:  CT abdomen pelvis 10/09/2021. FINDINGS: Cardiovascular: Atherosclerotic calcification of the aorta and aortic valve. Heart size normal. No pericardial effusion. Mediastinum/Nodes: 12 mm low-attenuation lesion in the right thyroid. No follow-up recommended. (Ref: J Am Coll Radiol. 2015 Feb;12(2): 143-50).No pathologically enlarged mediastinal or axillary lymph nodes. Esophagus is grossly unremarkable. Lungs/Pleura: Mild subpleural  radiation scarring in the  anterior right lung. Minimal scarring in the lingula. 10 mm right lower lobe nodule (9 x 11 mm, 6/87), as on 10/09/2021. Additional tiny pulmonary nodules measure up to 3 mm in the left lower lobe (6/74). No pleural fluid. Airway is unremarkable. Upper Abdomen: Visualized portions of the liver, gallbladder and right adrenal gland are unremarkable. Left adrenal thickening, unchanged. Recommend continued attention on follow-up. Subcentimeter low-attenuation lesions in the kidneys, too small to characterize. No specific follow-up necessary. Left renal stone. Visualized portions of the spleen and pancreas are unremarkable. Percutaneous gastrostomy. Extensive peritoneal masslike soft tissue thickening and nodularity, better evaluated on 10/09/2021. Musculoskeletal: Degenerative changes in the spine. Question old sternal fracture. L1 compression deformity, as on 10/09/2021. Dextroconvex scoliosis of the thoracolumbar spine. No worrisome lytic or sclerotic lesions. IMPRESSION: 1. 10 mm right lower lobe nodule, stable, with a few additional scattered tiny pulmonary nodules. Metastatic disease cannot be excluded. Recommend continued attention on follow-up. If a more aggressive approach is desired, PET may be helpful in further assessment of the larger right lower lobe nodule. 2. 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 3. Left renal stone. 4. Aortic atherosclerosis (ICD10-I70.0). Electronically Signed   By: Lorin Picket M.D.   On: 11/04/2021 08:52   DG Abd 1 View  Result Date: 10/16/2021 CLINICAL DATA:  Tumor in the distal transverse colon with omental metastases. Check NG tube placement. EXAM: ABDOMEN - 1 VIEW COMPARISON:  Abdomen from earlier today at 8:15 a.m., CT with IV and oral contrast 10/09/2021. FINDINGS: NGT is now in place, the tip projected at the mid body of stomach. There is a new PEG tube as well. Also newly noted is a large bore tube superimposing in the right hemiabdomen paralleling  the course of the distal ascending and transverse colon with interval decompression of the previously dilated proximal transverse colon and partial decompression of the upstream central abdominal small bowel. Visceral shadows are stable.  No new abnormality is seen. IMPRESSION: 1. NGT tip projects at the mid body of stomach. 2. New PEG tube just medial to the NG tube tip. 3. Large bore tubing superimposing over or within the distal ascending and proximal transverse colon with interval decompression of the previously dilated proximal colon, and partial decompression of the previously dilated upstream central abdominal small bowel. Electronically Signed   By: Telford Nab M.D.   On: 10/16/2021 20:57   DG Abd Portable 1V  Result Date: 10/16/2021 CLINICAL DATA:  Large bowel obstruction. EXAM: PORTABLE ABDOMEN - 1 VIEW COMPARISON:  CT of the abdomen and pelvis of October 09, 2021 FINDINGS: Transverse colon is slightly more dilated than on the most recent comparison at 8.7 cm though is less dilated than on the study of August 07, 2021 where reach 11 cm. There is some gas and stool in the rectum. Stool is seen in the collapsed descending colon. There are dilated centrally located small bowel loops with increase, some of these bowel loops may reach 5 cm or slightly greater. IMPRESSION: Persistent colonic obstruction. Caliber at 8.7 cm while diminished since June is slightly increased since August 18. At this caliber or above the patient may be at increased risk for perforation. Increasing small bowel distension in the central abdomen. In this patient with known colonic obstruction and peritoneal disease. Electronically Signed   By: Zetta Bills M.D.   On: 10/16/2021 08:28   CT ABDOMEN PELVIS W CONTRAST  Result Date: 10/09/2021 CLINICAL DATA:  Abdominal pain, diarrhea. EXAM: CT  ABDOMEN AND PELVIS WITH CONTRAST TECHNIQUE: Multidetector CT imaging of the abdomen and pelvis was performed using the standard protocol  following bolus administration of intravenous contrast. RADIATION DOSE REDUCTION: This exam was performed according to the departmental dose-optimization program which includes automated exposure control, adjustment of the mA and/or kV according to patient size and/or use of iterative reconstruction technique. CONTRAST:  66m ISOVUE-300 IOPAMIDOL (ISOVUE-300) INJECTION 61% COMPARISON:  None Available. FINDINGS: Lower chest: 13 x 11 mm nodule is noted in right lower lobe. Hepatobiliary: No focal liver abnormality is seen. No gallstones, gallbladder wall thickening, or biliary dilatation. Pancreas: Unremarkable. No pancreatic ductal dilatation or surrounding inflammatory changes. Spleen: Normal in size without focal abnormality. Adrenals/Urinary Tract: Adrenal glands are unremarkable. Small nonobstructive left renal calculus is noted. No hydronephrosis or renal obstruction is noted. Urinary bladder is unremarkable. Stomach/Bowel: The stomach appears normal. Severe dilatation of the right and proximal transverse colon is noted which appears to be severely obstructed by large irregular mass in the distal transverse colon most consistent with colonic malignancy. Mild amount of stool is seen in the more distal nondilated sigmoid colon. Sigmoid diverticulosis is noted without inflammation. There also appears to be distal small bowel dilatation secondary to the colonic obstruction. Vascular/Lymphatic: Aortic atherosclerosis. Mildly enlarged periaortic adenopathy is noted which may represent metastatic disease. Reproductive: Uterus and bilateral adnexa are unremarkable. Other: Multiple peritoneal implants are noted, predominantly in the left side of the abdomen as well as omental caking, consistent with peritoneal carcinomatosis. Musculoskeletal: Multiple rounded lucencies are noted in both visualized proximal femurs concerning for metastatic disease. Multilevel degenerative disc disease is noted in the lumbar spine.  IMPRESSION: There is a large irregular mass in the distal transverse colon most consistent with colonic malignancy. This causes severe obstruction of the more proximal transverse and right colon as well as the distal small bowel. Mild periaortic adenopathy is noted consistent with metastatic disease. Multiple large peritoneal implants are noted, particularly in the left side of the abdomen as well as omental caking, most consistent with peritoneal carcinomatosis. Multiple rounded lucencies are noted in the visualized femurs concerning for metastatic disease. These results will be called to the ordering clinician or representative by the Radiologist Assistant, and communication documented in the PACS or zVision Dashboard. 13 x 11 mm nodule is noted in right lower lobe concerning for metastatic disease. Small nonobstructive left renal calculus. Aortic Atherosclerosis (ICD10-I70.0). Electronically Signed   By: JMarijo ConceptionM.D.   On: 10/09/2021 15:29    Micro Results    No results found for this or any previous visit (from the past 240 hour(s)).  Today   Subjective    SArdys [place]today has no headache,no chest abdominal pain,no new weakness tingling or numbness, feels much better wants to go home today.    Objective   Blood pressure 122/65, pulse 80, temperature 98.5 F (36.9 C), temperature source Oral, resp. rate 20, height _0  (1.499 m), weight 53.6 kg, SpO2 93 %.   Intake/Output Summary (Last 24 hours) at 11/08/2021 0849 Last data filed at 11/08/2021 0400 Gross per 24 hour  Intake 130 ml  Output 500 ml  Net -370 ml    Exam  Awake Alert, No new F.N deficits,    [place].AT,PERRAL Supple Neck,   Symmetrical Chest wall movement, Good air movement bilaterally, CTAB RRR,No Gallops,   +ve B.Sounds, Abd Soft, Non tender, Ostomy site and PEG site stable No Cyanosis, Clubbing or edema    Data Review   Recent  Labs  Lab 11/05/21 1217 11/05/21 1808 11/07/21 0750 11/08/21 0644  WBC  7.6  --  6.7 6.5  HGB 12.4 10.2* 10.2* 10.1*  HCT 37.9 30.0* 28.1* 28.1*  PLT 514*  --  393 376  MCV 103.0*  --  94.3 96.2  MCH 33.7  --  34.2* 34.6*  MCHC 32.7  --  36.3* 35.9  RDW 12.1  --  11.9 11.9  LYMPHSABS 0.9  --   --  1.4  MONOABS 0.6  --   --  0.8  EOSABS 0.0  --   --  0.4  BASOSABS 0.1  --   --  0.1    Recent Labs  Lab 11/05/21 1447 11/05/21 1806 11/05/21 1808 11/06/21 0516 11/06/21 2124 11/07/21 0750 11/08/21 0644  NA 125* 122* 121* 125* 128* 127* 130*  K 6.2* 5.0 5.1 3.4* 3.1* 2.9* 4.4  CL 96* 91*  --  91* 87* 86* 94*  CO2 <7* 12*  --  19* _0 GLUCOSE 87 94  --  153* 115* 93 85  BUN 81* 77*  --  74* 59* 50* 38*  CREATININE 5.74* 5.33*  --  3.87* 2.12* 1.41* 1.11*  CALCIUM 10.0 9.6  --  8.3* 8.4* 8.2* 8.5*  AST 31  --   --   --   --   --   --   ALT 14  --   --   --   --   --   --   ALKPHOS 43  --   --   --   --   --   --   BILITOT 0.5  --   --   --   --   --   --   ALBUMIN 4.0  --   --  3.3*  --   --   --   MG 2.5*  --   --   --   --  1.4* 2.3  PHOS  --   --   --  8.5*  --   --   --     Total Time in preparing paper work, data evaluation and todays exam - 35 minutes  Lala Lund M.D on 11/08/2021 at 8:49 AM  [facility]

## 2021-11-09 ENCOUNTER — Encounter: Payer: Self-pay | Admitting: Oncology

## 2021-11-09 ENCOUNTER — Other Ambulatory Visit: Payer: Self-pay

## 2021-11-09 ENCOUNTER — Encounter (HOSPITAL_COMMUNITY): Payer: Medicare HMO

## 2021-11-09 ENCOUNTER — Encounter: Payer: Self-pay | Admitting: Gynecologic Oncology

## 2021-11-09 ENCOUNTER — Telehealth: Payer: Self-pay | Admitting: *Deleted

## 2021-11-09 ENCOUNTER — Encounter (HOSPITAL_COMMUNITY): Admission: RE | Admit: 2021-11-09 | Payer: Medicare HMO | Source: Ambulatory Visit

## 2021-11-09 DIAGNOSIS — N179 Acute kidney failure, unspecified: Secondary | ICD-10-CM | POA: Diagnosis not present

## 2021-11-09 DIAGNOSIS — Z931 Gastrostomy status: Secondary | ICD-10-CM | POA: Diagnosis not present

## 2021-11-09 DIAGNOSIS — C786 Secondary malignant neoplasm of retroperitoneum and peritoneum: Secondary | ICD-10-CM | POA: Diagnosis not present

## 2021-11-09 DIAGNOSIS — Z09 Encounter for follow-up examination after completed treatment for conditions other than malignant neoplasm: Secondary | ICD-10-CM | POA: Diagnosis not present

## 2021-11-09 DIAGNOSIS — E46 Unspecified protein-calorie malnutrition: Secondary | ICD-10-CM | POA: Diagnosis not present

## 2021-11-09 DIAGNOSIS — R5381 Other malaise: Secondary | ICD-10-CM | POA: Diagnosis not present

## 2021-11-09 NOTE — Telephone Encounter (Signed)
Called and left the patient a message that the office has canceled her bone scan appt for today per her request

## 2021-11-09 NOTE — Progress Notes (Signed)
Called and spoke with the patient and her husband. Discussed her recent hospitalization. She is overall feeling better, tolerating both liquids and oral intake. Discussed importance of staying hydrated. They canceled her bone scan, which was scheduled for today, given discharge from the hospital later. They raised concerns about disjointed care. I listened to their concerns and offered my understanding as well as some ideas to improve h er care moving forward. They are meeting with Dr. Benay Spice this week and will reach out to me after their visit.  Jeral Pinch MD

## 2021-11-10 ENCOUNTER — Inpatient Hospital Stay: Payer: Medicare HMO | Admitting: Oncology

## 2021-11-10 ENCOUNTER — Other Ambulatory Visit: Payer: Self-pay | Admitting: *Deleted

## 2021-11-10 ENCOUNTER — Inpatient Hospital Stay: Payer: Medicare HMO

## 2021-11-10 ENCOUNTER — Encounter: Payer: Self-pay | Admitting: *Deleted

## 2021-11-10 ENCOUNTER — Other Ambulatory Visit: Payer: Self-pay | Admitting: Radiology

## 2021-11-10 VITALS — BP 139/87 | HR 100 | Temp 98.2°F | Resp 18 | Ht 59.0 in | Wt 126.0 lb

## 2021-11-10 DIAGNOSIS — C481 Malignant neoplasm of specified parts of peritoneum: Secondary | ICD-10-CM

## 2021-11-10 DIAGNOSIS — C786 Secondary malignant neoplasm of retroperitoneum and peritoneum: Secondary | ICD-10-CM | POA: Diagnosis not present

## 2021-11-10 DIAGNOSIS — R531 Weakness: Secondary | ICD-10-CM | POA: Diagnosis not present

## 2021-11-10 DIAGNOSIS — Z801 Family history of malignant neoplasm of trachea, bronchus and lung: Secondary | ICD-10-CM | POA: Diagnosis not present

## 2021-11-10 DIAGNOSIS — C579 Malignant neoplasm of female genital organ, unspecified: Secondary | ICD-10-CM | POA: Diagnosis not present

## 2021-11-10 DIAGNOSIS — E860 Dehydration: Secondary | ICD-10-CM | POA: Diagnosis not present

## 2021-11-10 DIAGNOSIS — K56609 Unspecified intestinal obstruction, unspecified as to partial versus complete obstruction: Secondary | ICD-10-CM | POA: Diagnosis not present

## 2021-11-10 DIAGNOSIS — R971 Elevated cancer antigen 125 [CA 125]: Secondary | ICD-10-CM | POA: Diagnosis not present

## 2021-11-10 DIAGNOSIS — I959 Hypotension, unspecified: Secondary | ICD-10-CM | POA: Diagnosis not present

## 2021-11-10 DIAGNOSIS — Z5189 Encounter for other specified aftercare: Secondary | ICD-10-CM | POA: Diagnosis not present

## 2021-11-10 DIAGNOSIS — M899 Disorder of bone, unspecified: Secondary | ICD-10-CM | POA: Diagnosis not present

## 2021-11-10 DIAGNOSIS — R918 Other nonspecific abnormal finding of lung field: Secondary | ICD-10-CM | POA: Diagnosis not present

## 2021-11-10 DIAGNOSIS — Z5111 Encounter for antineoplastic chemotherapy: Secondary | ICD-10-CM | POA: Diagnosis not present

## 2021-11-10 DIAGNOSIS — R799 Abnormal finding of blood chemistry, unspecified: Secondary | ICD-10-CM | POA: Diagnosis not present

## 2021-11-10 DIAGNOSIS — Z853 Personal history of malignant neoplasm of breast: Secondary | ICD-10-CM | POA: Diagnosis not present

## 2021-11-10 DIAGNOSIS — K2270 Barrett's esophagus without dysplasia: Secondary | ICD-10-CM | POA: Diagnosis not present

## 2021-11-10 DIAGNOSIS — Z931 Gastrostomy status: Secondary | ICD-10-CM | POA: Diagnosis not present

## 2021-11-10 DIAGNOSIS — Z800 Family history of malignant neoplasm of digestive organs: Secondary | ICD-10-CM | POA: Diagnosis not present

## 2021-11-10 DIAGNOSIS — I10 Essential (primary) hypertension: Secondary | ICD-10-CM | POA: Diagnosis not present

## 2021-11-10 DIAGNOSIS — R112 Nausea with vomiting, unspecified: Secondary | ICD-10-CM | POA: Diagnosis not present

## 2021-11-10 DIAGNOSIS — E039 Hypothyroidism, unspecified: Secondary | ICD-10-CM | POA: Diagnosis not present

## 2021-11-10 MED ORDER — ONDANSETRON HCL 4 MG PO TABS: 4.0000 mg | ORAL_TABLET | Freq: Three times a day (TID) | ORAL | 2 refills | Status: DC | PRN

## 2021-11-10 MED ORDER — LOPERAMIDE HCL 2 MG PO TABS: 4.0000 mg | ORAL_TABLET | Freq: Four times a day (QID) | ORAL | 2 refills | Status: DC | PRN

## 2021-11-10 MED ORDER — DIPHENOXYLATE-ATROPINE 2.5-0.025 MG PO TABS: 1.0000 | ORAL_TABLET | Freq: Two times a day (BID) | ORAL | 1 refills | Status: DC | PRN

## 2021-11-10 MED ORDER — DEXAMETHASONE 2 MG PO TABS: 10.0000 mg | ORAL_TABLET | ORAL | 0 refills | Status: DC

## 2021-11-10 MED ORDER — PROCHLORPERAZINE MALEATE 10 MG PO TABS: 10.0000 mg | ORAL_TABLET | Freq: Four times a day (QID) | ORAL | 0 refills | Status: DC | PRN

## 2021-11-10 MED ORDER — LIDOCAINE-PRILOCAINE 2.5-2.5 % EX CREA: 1.0000 | TOPICAL_CREAM | CUTANEOUS | 0 refills | Status: DC | PRN

## 2021-11-10 MED ORDER — ONDANSETRON HCL 8 MG PO TABS: 8.0000 mg | ORAL_TABLET | Freq: Three times a day (TID) | ORAL | 2 refills | Status: DC | PRN

## 2021-11-10 NOTE — Progress Notes (Signed)
[place] OFFICE PROGRESS NOTE   Diagnosis: Primary peritoneal carcinoma  INTERVAL HISTORY:   Ms. Overholser was admitted on 11/05/2021 with nausea and dehydration.  The creatinine was elevated.  Her clinical status improved with intravenous hydration.  Zofran has helped nausea.  She is taking Imodium 3 times daily.  She reports emptying the ileostomy bag approximately 4-5 times per day.  The bag is not completely full when emptied.  The stool is liquid.  No nausea or vomiting.  She has not use the venting gastrostomy tube.  She is tolerating liquids and a diet.  Mr. Burgo is scheduled for Port-A-Cath placement tomorrow.  She is here with her husband.  She attended a chemotherapy teaching class earlier today.  Objective:  Vital signs in last 24 hours:  Blood pressure 139/87, pulse 100, temperature 98.2 F (36.8 C), temperature source Oral, resp. rate 18, height '4\' 11"'  (1.499 m), weight 126 lb (57.2 kg), SpO2 98 %.    HEENT: The mucous membranes are moist, no thrush Resp: Lungs clear bilaterally Cardio: Regular rate and rhythm GI: Mildly distended, right abdomen ileostomy with brown liquid stool.  The stool has some consistency.  Left upper quadrant venting gastrostomy Vascular: No leg edema   Lab Results:  Lab Results  Component Value Date   WBC 6.5 11/08/2021   HGB 10.1 (L) 11/08/2021   HCT 28.1 (L) 11/08/2021   MCV 96.2 11/08/2021   PLT 376 11/08/2021   NEUTROABS 3.8 11/08/2021    CMP  Lab Results  Component Value Date   NA 130 (L) 11/08/2021   K 4.4 11/08/2021   CL 94 (L) 11/08/2021   CO2 27 11/08/2021   GLUCOSE 85 11/08/2021   BUN 38 (H) 11/08/2021   CREATININE 1.11 (H) 11/08/2021   CALCIUM 8.5 (L) 11/08/2021   PROT 7.5 11/05/2021   ALBUMIN 3.3 (L) 11/06/2021   AST 31 11/05/2021   ALT 14 11/05/2021   ALKPHOS 43 11/05/2021   BILITOT 0.5 11/05/2021   GFRNONAA 51 (L) 11/08/2021    Lab Results  Component Value Date   CEA1 2.5 10/18/2021     No results found for: "INR", "LABPROT"  Imaging:  No results found.  Medications: I have reviewed the patient's current medications.   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  Bone lesions concerning for metastases, right lung nodule Laparoscopy, diverting loop ileostomy, gastrostomy tube placement, peritoneal biopsy 10/16/2021, no evidence of a primary tumor site at the appendix, ovaries, or uterus.  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  CDX2, CK20, and GATA3 negative.  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 08/11/2021 CA125 818 CT chest 912 2023-10 mm right lower lobe nodule, scattered tiny pulmonary nodules, peritoneal carcinomatosis 2.   Abdominal distention/pain and diarrhea secondary to #1 3.   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  Lumpectomy and adjuvant CMF x8 followed by radiation 4. Hypertension 5. Hypothyroidism 6. Barrett's esophagus 7. Family history of multiple cancers including appendix, colon, and lung cancer 8.  Hospital admission 11/05/2021 with dehydration/prerenal azotemia secondary to nausea and high output ileostomy  Disposition: Mr. Maxfield has been diagnosed with abdominal carcinomatosis, most likely secondary to primary peritoneal carcinoma.  She has been evaluated by Dr. Berline Lopes.  The plan is to proceed with neoadjuvant Taxol/carboplatin.  She will be scheduled for  cytoreductive surgery unless she is confirmed to have metastatic disease outside of the abdominal cavity.  She is being scheduled for a bone scan to follow-up on the suspicious bone lesions noted on the abdomen/pelvis CT.  I discussed the Taxol/carboplatin regimen with Ms. Cypert and her husband.  She understands the chance of nausea/vomiting, alopecia, hematologic toxicity, infection, and bleeding.  We discussed the allergic reaction, bone pain, ototoxicity, peripheral neuropathy associated with paclitaxel.  We discussed the allergic reaction associated with carboplatin.  I recommend G-CSF support.  We reviewed the bone pain, rash, and splenic rupture associated with G-CSF.  She agrees to proceed.  She attended a chemotherapy teaching class today.  Ms. Mallinger is scheduled for Port-A-Cath placement tomorrow.  She will return for cycle 1 Taxol/carboplatin on 11/12/2021.  She will be scheduled for an office visit and nadir CBC on 11/23/2021.  I encouraged her to push oral hydration and continue Imodium.  She will contact us for symptoms of dehydration or for increased output from the ileostomy.  We will consider adding bevacizumab if she is not a surgical candidate.  A chemotherapy plan was entered today.  [name], MD  11/10/2021  4:00 PM

## 2021-11-10 NOTE — Progress Notes (Signed)
PATIENT NAVIGATOR PROGRESS NOTE  Name: Gale Klar Date: 11/10/2021 MRN: [id number]  DOB: [birth year]   Reason for visit:  F/U visit and pre treatment discussion  Comments:  Met with Mr and Mrs Macdowell during visit with Dr Benay Spice Active listening provided to pt and her husband about their frustrations Chemo education on Taxol/Carboplatin completed Prescriptions sent in for Dex(prior to first treatment only), zofran, compazine and EMLA cream. All directions written and given Port to be placed in am at [facility], instructions reviewed with pt and her husband Call placed to [employer] regarding placing order for ostomy supplies as Mr Friedli has not been able to do yet and spoke with [employer] and she is helping to facilitate which company to place order and will contact Mr Eliabeth Shoff will be drawn during port placement She will start treatment on 9/21 at 0800    Time spent counseling/coordinating care: > 60 minutes

## 2021-11-10 NOTE — Progress Notes (Signed)
Referrals placed for nutrition and social work

## 2021-11-11 ENCOUNTER — Encounter (HOSPITAL_COMMUNITY): Payer: Self-pay

## 2021-11-11 ENCOUNTER — Other Ambulatory Visit: Payer: Self-pay

## 2021-11-11 ENCOUNTER — Other Ambulatory Visit: Payer: Self-pay | Admitting: *Deleted

## 2021-11-11 ENCOUNTER — Inpatient Hospital Stay: Payer: Medicare HMO | Admitting: Licensed Clinical Social Worker

## 2021-11-11 ENCOUNTER — Ambulatory Visit (HOSPITAL_COMMUNITY)
Admission: RE | Admit: 2021-11-11 | Discharge: 2021-11-11 | Disposition: A | Discharge status: Home / Self Care | Payer: Medicare HMO | Source: Ambulatory Visit | Attending: Gynecologic Oncology | Admitting: Gynecologic Oncology

## 2021-11-11 DIAGNOSIS — C184 Malignant neoplasm of transverse colon: Secondary | ICD-10-CM | POA: Diagnosis not present

## 2021-11-11 DIAGNOSIS — R69 Illness, unspecified: Secondary | ICD-10-CM | POA: Diagnosis not present

## 2021-11-11 DIAGNOSIS — C786 Secondary malignant neoplasm of retroperitoneum and peritoneum: Secondary | ICD-10-CM | POA: Diagnosis not present

## 2021-11-11 DIAGNOSIS — E785 Hyperlipidemia, unspecified: Secondary | ICD-10-CM | POA: Diagnosis not present

## 2021-11-11 DIAGNOSIS — Z853 Personal history of malignant neoplasm of breast: Secondary | ICD-10-CM | POA: Diagnosis not present

## 2021-11-11 DIAGNOSIS — Z87891 Personal history of nicotine dependence: Secondary | ICD-10-CM | POA: Insufficient documentation

## 2021-11-11 DIAGNOSIS — M1990 Unspecified osteoarthritis, unspecified site: Secondary | ICD-10-CM | POA: Diagnosis not present

## 2021-11-11 DIAGNOSIS — C579 Malignant neoplasm of female genital organ, unspecified: Secondary | ICD-10-CM | POA: Insufficient documentation

## 2021-11-11 DIAGNOSIS — K2270 Barrett's esophagus without dysplasia: Secondary | ICD-10-CM | POA: Diagnosis not present

## 2021-11-11 DIAGNOSIS — Z9221 Personal history of antineoplastic chemotherapy: Secondary | ICD-10-CM | POA: Diagnosis not present

## 2021-11-11 DIAGNOSIS — C482 Malignant neoplasm of peritoneum, unspecified: Secondary | ICD-10-CM | POA: Diagnosis not present

## 2021-11-11 DIAGNOSIS — Z452 Encounter for adjustment and management of vascular access device: Secondary | ICD-10-CM | POA: Diagnosis not present

## 2021-11-11 DIAGNOSIS — Z483 Aftercare following surgery for neoplasm: Secondary | ICD-10-CM | POA: Diagnosis not present

## 2021-11-11 DIAGNOSIS — Z432 Encounter for attention to ileostomy: Secondary | ICD-10-CM | POA: Diagnosis not present

## 2021-11-11 DIAGNOSIS — K6389 Other specified diseases of intestine: Secondary | ICD-10-CM | POA: Diagnosis not present

## 2021-11-11 DIAGNOSIS — Z9181 History of falling: Secondary | ICD-10-CM | POA: Diagnosis not present

## 2021-11-11 DIAGNOSIS — H538 Other visual disturbances: Secondary | ICD-10-CM | POA: Diagnosis not present

## 2021-11-11 DIAGNOSIS — Z96653 Presence of artificial knee joint, bilateral: Secondary | ICD-10-CM | POA: Diagnosis not present

## 2021-11-11 DIAGNOSIS — Z993 Dependence on wheelchair: Secondary | ICD-10-CM | POA: Diagnosis not present

## 2021-11-11 DIAGNOSIS — I10 Essential (primary) hypertension: Secondary | ICD-10-CM | POA: Diagnosis not present

## 2021-11-11 DIAGNOSIS — Z431 Encounter for attention to gastrostomy: Secondary | ICD-10-CM | POA: Diagnosis not present

## 2021-11-11 DIAGNOSIS — H269 Unspecified cataract: Secondary | ICD-10-CM | POA: Diagnosis not present

## 2021-11-11 HISTORY — PX: IR IMAGING GUIDED PORT INSERTION: IMG5740

## 2021-11-11 HISTORY — PX: IR FLUORO GUIDE CV LINE RIGHT: IMG2283

## 2021-11-11 LAB — COMPREHENSIVE METABOLIC PANEL
ALT: 10 U/L (ref 0–44)
AST: 19 U/L (ref 15–41)
Albumin: 2.7 g/dL — ABNORMAL LOW (ref 3.5–5.0)
Alkaline Phosphatase: 33 U/L — ABNORMAL LOW (ref 38–126)
Anion gap: 7 (ref 5–15)
BUN: 14 mg/dL (ref 8–23)
CO2: 25 mmol/L (ref 22–32)
Calcium: 8.5 mg/dL — ABNORMAL LOW (ref 8.9–10.3)
Chloride: 101 mmol/L (ref 98–111)
Creatinine, Ser: 0.86 mg/dL (ref 0.44–1.00)
GFR, Estimated: >60 mL/min (ref >60)
Glucose, Bld: 85 mg/dL (ref 70–99)
Potassium: 3.8 mmol/L (ref 3.5–5.1)
Sodium: 133 mmol/L — ABNORMAL LOW (ref 135–145)
Total Bilirubin: 0.4 mg/dL (ref 0.3–1.2)
Total Protein: 5.7 g/dL — ABNORMAL LOW (ref 6.5–8.1)

## 2021-11-11 LAB — CBC WITH DIFFERENTIAL/PLATELET
Abs Immature Granulocytes: 0.03 10*3/uL (ref 0.00–0.07)
Basophils Absolute: 0.1 10*3/uL (ref 0.0–0.1)
Basophils Relative: 1 %
Eosinophils Absolute: 0.4 10*3/uL (ref 0.0–0.5)
Eosinophils Relative: 6 %
HCT: 28.4 % — ABNORMAL LOW (ref 36.0–46.0)
Hemoglobin: 9.8 g/dL — ABNORMAL LOW (ref 12.0–15.0)
Immature Granulocytes: 1 %
Lymphocytes Relative: 14 %
Lymphs Abs: 0.9 10*3/uL (ref 0.7–4.0)
MCH: 34.8 pg — ABNORMAL HIGH (ref 26.0–34.0)
MCHC: 34.5 g/dL (ref 30.0–36.0)
MCV: 100.7 fL — ABNORMAL HIGH (ref 80.0–100.0)
Monocytes Absolute: 0.5 10*3/uL (ref 0.1–1.0)
Monocytes Relative: 8 %
Neutro Abs: 4.3 10*3/uL (ref 1.7–7.7)
Neutrophils Relative %: 70 %
Platelets: 312 10*3/uL (ref 150–400)
RBC: 2.82 MIL/uL — ABNORMAL LOW (ref 3.87–5.11)
RDW: 11.8 % (ref 11.5–15.5)
WBC: 6.2 10*3/uL (ref 4.0–10.5)
nRBC: 0 % (ref 0.0–0.2)

## 2021-11-11 LAB — MAGNESIUM: Magnesium: 1.2 mg/dL — ABNORMAL LOW (ref 1.7–2.4)

## 2021-11-11 MED ORDER — LOPERAMIDE HCL 2 MG PO TABS: 4.0000 mg | ORAL_TABLET | Freq: Four times a day (QID) | ORAL | 2 refills | Status: AC | PRN

## 2021-11-11 MED ORDER — LIDOCAINE HCL 1 % IJ SOLN
INTRAMUSCULAR | Status: AC
  Administered 2021-11-11: 20 mL
  Filled 2021-11-11: qty 20

## 2021-11-11 MED ORDER — HEPARIN SOD (PORK) LOCK FLUSH 100 UNIT/ML IV SOLN
INTRAVENOUS | Status: AC
  Administered 2021-11-11: 500 [IU]
  Filled 2021-11-11: qty 5

## 2021-11-11 MED ORDER — MIDAZOLAM HCL 2 MG/2ML IJ SOLN
INTRAMUSCULAR | Status: AC | PRN
  Administered 2021-11-11 (×4): .5 mg via INTRAVENOUS

## 2021-11-11 MED ORDER — SODIUM CHLORIDE 0.9 % IV SOLN
INTRAVENOUS | Status: AC | PRN
  Administered 2021-11-11: 10 mL/h via INTRAVENOUS

## 2021-11-11 MED ORDER — SODIUM CHLORIDE 0.9 % IV SOLN: INTRAVENOUS | Status: DC

## 2021-11-11 MED ORDER — MIDAZOLAM HCL 2 MG/2ML IJ SOLN
INTRAMUSCULAR | Status: AC
  Filled 2021-11-11: qty 2

## 2021-11-11 MED ORDER — FENTANYL CITRATE (PF) 100 MCG/2ML IJ SOLN
INTRAMUSCULAR | Status: AC | PRN
  Administered 2021-11-11 (×4): 25 ug via INTRAVENOUS

## 2021-11-11 MED ORDER — FENTANYL CITRATE (PF) 100 MCG/2ML IJ SOLN
INTRAMUSCULAR | Status: AC
  Filled 2021-11-11: qty 2

## 2021-11-11 NOTE — Progress Notes (Signed)
The following biosimilar Fulphila ([id number]-[id number]) has been selected for use in this patient.  Kennith Center, Pharm.D., CPP 11/11/2021'@11'$ :02 AM

## 2021-11-11 NOTE — Procedures (Signed)
Interventional Radiology Procedure Note  Procedure: Single Lumen Power Port Placement    Access:  Right IJ vein.  Findings: Catheter tip positioned at SVC/RA junction. Port is ready for immediate use.   Complications: None  EBL: < 10 mL  Recommendations:  - Ok to shower in 24 hours - Do not submerge for 7 days - Routine line care   Anes Rigel T. Yocheved Depner, M.D Pager:  [phone]   

## 2021-11-11 NOTE — Progress Notes (Signed)
[facility] Work  Initial Assessment   Nancy Harrison is a 81 y.o. year old female contacted by phone. Clinical Social Work was referred by nurse navigator for assessment of psychosocial needs.   SDOH (Social Determinants of Health) assessments performed: Yes SDOH Interventions    Flowsheet Row Clinical Support from 11/11/2021 in [place] Interventions   Food Insecurity Interventions Intervention Not Indicated  Housing Interventions Intervention Not Indicated  Transportation Interventions Intervention Not Indicated  Utilities Interventions Intervention Not Indicated  Financial Strain Interventions Intervention Not Indicated       SDOH Screenings   Food Insecurity: No Food Insecurity (11/11/2021)  Housing: Low Risk  (11/11/2021)  Transportation Needs: No Transportation Needs (11/11/2021)  Utilities: Not At Risk (11/11/2021)  Financial Resource Strain: Low Risk  (11/11/2021)  Tobacco Use: Medium Risk (11/11/2021)     Distress Screen completed: No    10/15/2021    2:21 PM  ONCBCN DISTRESS SCREENING  Screening Type Initial Screening  Distress experienced in past week (1-10) 0      Family/Social Information:  Housing Arrangement: patient lives with her husband, Nancy Harrison. Family members/support persons in your life? Family Transportation concerns: no  Employment: Retired   Income source: Paediatric nurse concerns: No Type of concern: None Food access concerns: no Religious or spiritual practice: Not known Services Currently in place:  [employer]  Coping/ Adjustment to diagnosis: Patient understands treatment plan and what happens next? yes Concerns about diagnosis and/or treatment: I'm not especially worried about anything Patient reported stressors:  None Current coping skills/ strengths: Financial means  and General fund of knowledge     SUMMARY: Current SDOH Barriers:  None identified by  patient.  Clinical Social Work Clinical Goal(s):  No clinical social work goals at this time  Interventions: Informed patient of the support team roles and support services at [facility] Patient declined CSW contact information over the phone.   Provided patient with information about SW role and the [employer]  Patient agreed to have CSW mail her business card and program literature.  Both husband and patient expressed no needs at this time.   Follow Up Plan: Patient will contact CSW with any support or resource needs Patient verbalizes understanding of plan: Yes    [name], LCSW

## 2021-11-11 NOTE — H&P (Signed)
Chief Complaint: Patient was seen in consultation today for carcinomatosis.  Referring Physician(s): [place] R  Supervising Physician: Aletta Edouard  Patient Status: [facility] - Out-pt  History of Present Illness: Nancy Harrison is a 81 y.o. 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  She underwent laparoscopic loop ileostomy creation, G-tube placement by Dr. Johney Maine 10/16/21.  She now has plans for upcoming chemotherapy and is in need of durable venous access.  IR consulted for Port-A-Cath placement.   Nancy Harrison presents to [facility] in her usual state of health today.  She has been NPO.  She does not take blood thinners.  She is familiar with Port-A-Cath placement from prior Port 25 year ago while undergoing treatment for her breast cancer.  She has no questions today.  She is agreeable to proceed.   Her husband will be available for post-procedure care and transportation.   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   Cataract    Family history of colon cancer    Gastrostomy in place [facility])    LUQ - placed 10/16/2021   Hypertension    Ileostomy, has currently [facility])    placed 09/2021   SUI (stress urinary incontinence, female)    Thyroid disease    Urinary urgency     Past Surgical History:  Procedure Laterality Date   BREAST LUMPECTOMY  1999   Chemo and radiation on right breast   LAPAROSCOPY N/A 10/16/2021   Procedure: LAPAROSCOPIC  DIVERTING OSTOMY, BIOSPY,  G-TUBE PLACEMENT;  Surgeon: Michael Boston, MD;  Location: [facility] ORS;  Service: General;  Laterality: N/A;   [place]    Allergies: Sporanox  [itraconazole]  Medications: Prior to Admission medications   Medication Sig Start Date End Date Taking? Authorizing Provider  acetaminophen (TYLENOL) 325 MG tablet Take 2 tablets (650 mg total) by mouth every 6 (six) hours as needed for mild pain or fever. 10/22/21  Yes Meuth, Brooke A, PA-C  loratadine (CLARITIN) 10 MG tablet Take 10 mg by mouth 2 (two) times daily.   Yes [provider]  MELATONIN CR PO Take 1 tablet by mouth at bedtime.   Yes [provider]  Multiple Vitamin (MULTIVITAMIN PO) Take 1 tablet by mouth daily with breakfast.   Yes [provider]  dexamethasone (DECADRON) 2 MG tablet Take 5 tablets (10 mg total) by mouth as directed. Take 10 mg at 7 pm night before 1st chemo treatment and at 6 am day of 1st chemo treatment 11/10/21   Ladell Pier, MD  diphenoxylate-atropine (LOMOTIL) 2.5-0.025 MG tablet Take 1 tablet by mouth every 12 (twelve) hours as needed for diarrhea or loose stools. 11/10/21 11/10/22  Ladell Pier, MD  lidocaine-prilocaine (EMLA) cream Apply 1 Application topically as needed. 11/10/21   Ladell Pier, MD  loperamide (IMODIUM A-D) 2 MG tablet Take 2 tablets (4 mg total) by mouth 4 (four) times daily as needed for diarrhea or loose stools. 11/10/21   Ladell Pier, MD  ondansetron (ZOFRAN) 8 MG tablet Take 1 tablet (8 mg total) by mouth every 8 (eight) hours as needed for nausea or vomiting (  Start using 3 days after chemo as needed for nausea/vomiting). 11/10/21   Ladell Pier, MD  prochlorperazine (COMPAZINE) 10 MG tablet Take 1 tablet (10 mg total) by mouth every 6 (six) hours as needed for nausea or vomiting. 11/10/21   Ladell Pier, MD     Family History  Problem Relation Age of Onset   Colon cancer Mother 69   Lung cancer Sister    Cancer Maternal Aunt        NOS   Cancer Daughter        appendiceal   Stomach cancer Neg Hx    Rectal cancer Neg Hx    Esophageal cancer Neg Hx     Social History    Socioeconomic History   Marital status: Married    Spouse name: Not on file   Number of children: Not on file   Years of education: Not on file   Highest education level: Not on file  Occupational History   Not on file  Tobacco Use   Smoking status: Former    Types: Cigarettes    Quit date: 02/23/1972    Years since quitting: 49.7   Smokeless tobacco: Never  Substance and Sexual Activity   Alcohol use: Yes    Alcohol/week: 8.0 standard drinks of alcohol    Types: 8 Glasses of wine per week   Drug use: No   Sexual activity: Not on file  Other Topics Concern   Not on file  Social History Narrative   Not on file   Social Determinants of Health   Financial Resource Strain: Not on file  Food Insecurity: Not on file  Transportation Needs: Not on file  Physical Activity: Not on file  Stress: Not on file  Social Connections: Not on file     Review of Systems: A 12 point ROS discussed and pertinent positives are indicated in the HPI above.  All other systems are negative.  Review of Systems  Constitutional:  Negative for fatigue and fever.  Respiratory:  Negative for cough and shortness of breath.   Cardiovascular:  Negative for chest pain.  Gastrointestinal:  Negative for abdominal pain, nausea and vomiting.  Musculoskeletal:  Negative for back pain.  Psychiatric/Behavioral:  Negative for behavioral problems and confusion.     Vital Signs: BP (!) 147/74   Pulse 94   Temp 97.8 F (36.6 C) (Temporal)   Resp 17   Ht '4\' 11"'$  (1.499 m)   Wt 125 lb (56.7 kg)   SpO2 94%   BMI 25.25 kg/m   Physical Exam Vitals and nursing note reviewed.  Constitutional:      Appearance: Normal appearance.  HENT:     Mouth/Throat:     Mouth: Mucous membranes are moist.     Pharynx: Oropharynx is clear.  Cardiovascular:     Rate and Rhythm: Normal rate and regular rhythm.  Pulmonary:     Effort: Pulmonary effort is normal.     Breath sounds: Normal breath sounds.   Musculoskeletal:     Cervical back: Normal range of motion and neck supple.  Skin:    General: Skin is warm and dry.  Neurological:     General: No focal deficit present.     Mental Status: She is alert and oriented to person, place, and time. Mental status is at baseline.  Psychiatric:        Mood and Affect: Mood normal.        Behavior: Behavior normal.  Thought Content: Thought content normal.        Judgment: Judgment normal.      MD Evaluation Airway: WNL Heart: WNL Abdomen: WNL Chest/ Lungs: WNL ASA  Classification: 3 Mallampati/Airway Score: Two   Imaging: US Renal  Result Date: 11/05/2021 CLINICAL DATA:  Acute kidney injury EXAM: RENAL / URINARY TRACT ULTRASOUND COMPLETE COMPARISON:  None Available. FINDINGS: Right Kidney: Renal measurements: 8.9 x 4.3 x 3.8 cm = volume: 76.4 mL. Echogenicity within normal limits. No mass or hydronephrosis visualized. Left Kidney: Renal measurements: 10.2 x 5.8 x 6.0 cm = volume: 186.8 mL. Echogenicity within normal limits. No mass or hydronephrosis visualized. Bladder: Decompressed. Other: None. IMPRESSION: No hydronephrosis. Electronically Signed   By: Maurine Simmering M.D.   On: 11/05/2021 19:59   CT Chest W Contrast  Result Date: 11/04/2021 CLINICAL DATA:  Gentle cancer, peritoneal carcinomatosis, staging. Cough. * Tracking Code: BO * EXAM: CT CHEST WITH CONTRAST TECHNIQUE: Multidetector CT imaging of the chest was performed during intravenous contrast administration. RADIATION DOSE REDUCTION: This exam was performed according to the departmental dose-optimization program which includes automated exposure control, adjustment of the mA and/or kV according to patient size and/or use of iterative reconstruction technique. CONTRAST:  9m OMNIPAQUE IOHEXOL 300 MG/ML  SOLN COMPARISON:  CT abdomen pelvis 10/09/2021. FINDINGS: Cardiovascular: Atherosclerotic calcification of the aorta and aortic valve. Heart size normal. No pericardial  effusion. Mediastinum/Nodes: 12 mm low-attenuation lesion in the right thyroid. No follow-up recommended. (Ref: J Am Coll Radiol. 2015 Feb;12(2): 143-50).No pathologically enlarged mediastinal or axillary lymph nodes. Esophagus is grossly unremarkable. Lungs/Pleura: Mild subpleural radiation scarring in the anterior right lung. Minimal scarring in the lingula. 10 mm right lower lobe nodule (9 x 11 mm, 6/87), as on 10/09/2021. Additional tiny pulmonary nodules measure up to 3 mm in the left lower lobe (6/74). No pleural fluid. Airway is unremarkable. Upper Abdomen: Visualized portions of the liver, gallbladder and right adrenal gland are unremarkable. Left adrenal thickening, unchanged. Recommend continued attention on follow-up. Subcentimeter low-attenuation lesions in the kidneys, too small to characterize. No specific follow-up necessary. Left renal stone. Visualized portions of the spleen and pancreas are unremarkable. Percutaneous gastrostomy. Extensive peritoneal masslike soft tissue thickening and nodularity, better evaluated on 10/09/2021. Musculoskeletal: Degenerative changes in the spine. Question old sternal fracture. L1 compression deformity, as on 10/09/2021. Dextroconvex scoliosis of the thoracolumbar spine. No worrisome lytic or sclerotic lesions. IMPRESSION: 1. 10 mm right lower lobe nodule, stable, with a few additional scattered tiny pulmonary nodules. Metastatic disease cannot be excluded. Recommend continued attention on follow-up. If a more aggressive approach is desired, PET may be helpful in further assessment of the larger right lower lobe nodule. 2. 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 3. Left renal stone. 4. Aortic atherosclerosis (ICD10-I70.0). Electronically Signed   By: MLorin PicketM.D.   On: 11/04/2021 08:52   DG Abd 1 View  Result Date: 10/16/2021 CLINICAL DATA:  Tumor in the distal transverse colon with omental metastases. Check NG tube placement. EXAM:  ABDOMEN - 1 VIEW COMPARISON:  Abdomen from earlier today at 8:15 a.m., CT with IV and oral contrast 10/09/2021. FINDINGS: NGT is now in place, the tip projected at the mid body of stomach. There is a new PEG tube as well. Also newly noted is a large bore tube superimposing in the right hemiabdomen paralleling the course of the distal ascending and transverse colon with interval decompression of the previously dilated proximal transverse colon and partial decompression of the upstream central  abdominal small bowel. Visceral shadows are stable.  No new abnormality is seen. IMPRESSION: 1. NGT tip projects at the mid body of stomach. 2. New PEG tube just medial to the NG tube tip. 3. Large bore tubing superimposing over or within the distal ascending and proximal transverse colon with interval decompression of the previously dilated proximal colon, and partial decompression of the previously dilated upstream central abdominal small bowel. Electronically Signed   By: Telford Nab M.D.   On: 10/16/2021 20:57   DG Abd Portable 1V  Result Date: 10/16/2021 CLINICAL DATA:  Large bowel obstruction. EXAM: PORTABLE ABDOMEN - 1 VIEW COMPARISON:  CT of the abdomen and pelvis of October 09, 2021 FINDINGS: Transverse colon is slightly more dilated than on the most recent comparison at 8.7 cm though is less dilated than on the study of August 07, 2021 where reach 11 cm. There is some gas and stool in the rectum. Stool is seen in the collapsed descending colon. There are dilated centrally located small bowel loops with increase, some of these bowel loops may reach 5 cm or slightly greater. IMPRESSION: Persistent colonic obstruction. Caliber at 8.7 cm while diminished since June is slightly increased since August 18. At this caliber or above the patient may be at increased risk for perforation. Increasing small bowel distension in the central abdomen. In this patient with known colonic obstruction and peritoneal disease.  Electronically Signed   By: Zetta Bills M.D.   On: 10/16/2021 08:28    Labs:  CBC: Recent Labs    10/21/21 0630 11/05/21 1217 11/05/21 1808 11/07/21 0750 11/08/21 0644  WBC 6.5 7.6  --  6.7 6.5  HGB 12.0 12.4 10.2* 10.2* 10.1*  HCT 36.8 37.9 30.0* 28.1* 28.1*  PLT 367 514*  --  393 376    COAGS: No results for input(s): "INR", "APTT" in the last 8760 hours.  BMP: Recent Labs    11/06/21 0516 11/06/21 2124 11/07/21 0750 11/08/21 0644  NA 125* 128* 127* 130*  K 3.4* 3.1* 2.9* 4.4  CL 91* 87* 86* 94*  CO2 19* '25 28 27  '$ GLUCOSE 153* 115* 93 85  BUN 74* 59* 50* 38*  CALCIUM 8.3* 8.4* 8.2* 8.5*  CREATININE 3.87* 2.12* 1.41* 1.11*  GFRNONAA 11* 23* 38* 51*    LIVER FUNCTION TESTS: Recent Labs    10/15/21 1924 10/16/21 0544 11/05/21 1447 11/06/21 0516  BILITOT 0.6 0.8 0.5  --   AST '23 23 31  '$ --   ALT '11 11 14  '$ --   ALKPHOS 43 38 43  --   PROT 6.4* 6.1* 7.5  --   ALBUMIN 3.5 3.5 4.0 3.3*    TUMOR MARKERS: No results for input(s): "AFPTM", "CEA", "CA199", "CHROMGRNA" in the last 8760 hours.  Assessment and Plan: Patient with past medical history of breast cancer s/p treatment 25 years ago presents with complaint of stage IV extra ovarian carcinoma.  IR consulted for Port-A-Cath placement at the request of Dr. Benay Spice. Case reviewed by Dr. Kathlene Cote who approves patient for procedure.  Patient presents today in their usual state of health.  She has been NPO and is not currently on blood thinners.   Risks and benefits of image guided port-a-catheter placement was discussed with the patient including, but not limited to bleeding, infection, pneumothorax, or fibrin sheath development and need for additional procedures.  All of the patient's questions were answered, patient is agreeable to proceed. Consent signed and in chart.  Thank you for this  interesting consult.  I greatly enjoyed meeting Nancy Harrison and look forward to participating in their care.   A copy of this report was sent to the requesting provider on this date.  Electronically Signed: Docia Barrier, PA 11/11/2021, 8:43 AM   I spent a total of  30 Minutes   in face to face in clinical consultation, greater than 50% of which was counseling/coordinating care for carcinomatosis peritonei.

## 2021-11-11 NOTE — Progress Notes (Signed)
Mg+ orders placed for tomorrow

## 2021-11-11 NOTE — Progress Notes (Signed)
The proposed treatment discussed in conference is for discussion purpose only and is not a binding recommendation.  The patients have not been physically examined, or presented with their treatment options.  Therefore, final treatment plans cannot be decided.  

## 2021-11-12 ENCOUNTER — Encounter (HOSPITAL_COMMUNITY): Payer: Self-pay

## 2021-11-12 ENCOUNTER — Inpatient Hospital Stay: Payer: Medicare HMO

## 2021-11-12 ENCOUNTER — Other Ambulatory Visit: Payer: Self-pay | Admitting: Oncology

## 2021-11-12 ENCOUNTER — Other Ambulatory Visit: Payer: Self-pay

## 2021-11-12 VITALS — BP 145/79 | HR 94 | Temp 96.8°F | Resp 18 | Ht 59.0 in | Wt 126.6 lb

## 2021-11-12 DIAGNOSIS — R531 Weakness: Secondary | ICD-10-CM | POA: Diagnosis not present

## 2021-11-12 DIAGNOSIS — E860 Dehydration: Secondary | ICD-10-CM | POA: Diagnosis not present

## 2021-11-12 DIAGNOSIS — K2270 Barrett's esophagus without dysplasia: Secondary | ICD-10-CM | POA: Diagnosis not present

## 2021-11-12 DIAGNOSIS — E039 Hypothyroidism, unspecified: Secondary | ICD-10-CM | POA: Diagnosis not present

## 2021-11-12 DIAGNOSIS — C481 Malignant neoplasm of specified parts of peritoneum: Secondary | ICD-10-CM

## 2021-11-12 DIAGNOSIS — C579 Malignant neoplasm of female genital organ, unspecified: Secondary | ICD-10-CM | POA: Diagnosis not present

## 2021-11-12 DIAGNOSIS — Z5189 Encounter for other specified aftercare: Secondary | ICD-10-CM | POA: Diagnosis not present

## 2021-11-12 DIAGNOSIS — K56609 Unspecified intestinal obstruction, unspecified as to partial versus complete obstruction: Secondary | ICD-10-CM | POA: Diagnosis not present

## 2021-11-12 DIAGNOSIS — C786 Secondary malignant neoplasm of retroperitoneum and peritoneum: Secondary | ICD-10-CM | POA: Diagnosis not present

## 2021-11-12 DIAGNOSIS — Z5111 Encounter for antineoplastic chemotherapy: Secondary | ICD-10-CM | POA: Diagnosis not present

## 2021-11-12 DIAGNOSIS — Z800 Family history of malignant neoplasm of digestive organs: Secondary | ICD-10-CM | POA: Diagnosis not present

## 2021-11-12 DIAGNOSIS — I10 Essential (primary) hypertension: Secondary | ICD-10-CM | POA: Diagnosis not present

## 2021-11-12 DIAGNOSIS — R971 Elevated cancer antigen 125 [CA 125]: Secondary | ICD-10-CM | POA: Diagnosis not present

## 2021-11-12 DIAGNOSIS — M899 Disorder of bone, unspecified: Secondary | ICD-10-CM | POA: Diagnosis not present

## 2021-11-12 DIAGNOSIS — R918 Other nonspecific abnormal finding of lung field: Secondary | ICD-10-CM | POA: Diagnosis not present

## 2021-11-12 DIAGNOSIS — Z801 Family history of malignant neoplasm of trachea, bronchus and lung: Secondary | ICD-10-CM | POA: Diagnosis not present

## 2021-11-12 DIAGNOSIS — R112 Nausea with vomiting, unspecified: Secondary | ICD-10-CM | POA: Diagnosis not present

## 2021-11-12 DIAGNOSIS — R799 Abnormal finding of blood chemistry, unspecified: Secondary | ICD-10-CM | POA: Diagnosis not present

## 2021-11-12 DIAGNOSIS — I959 Hypotension, unspecified: Secondary | ICD-10-CM | POA: Diagnosis not present

## 2021-11-12 DIAGNOSIS — Z931 Gastrostomy status: Secondary | ICD-10-CM | POA: Diagnosis not present

## 2021-11-12 DIAGNOSIS — Z853 Personal history of malignant neoplasm of breast: Secondary | ICD-10-CM | POA: Diagnosis not present

## 2021-11-12 MED ORDER — SODIUM CHLORIDE 0.9 % IV SOLN
175.0000 mg/m2 | Freq: Once | INTRAVENOUS | Status: AC
  Administered 2021-11-12: 258 mg via INTRAVENOUS
  Filled 2021-11-12: qty 43

## 2021-11-12 MED ORDER — DIPHENHYDRAMINE HCL 50 MG/ML IJ SOLN
25.0000 mg | Freq: Once | INTRAMUSCULAR | Status: AC
  Administered 2021-11-12: 25 mg via INTRAVENOUS
  Filled 2021-11-12: qty 1

## 2021-11-12 MED ORDER — MAGNESIUM SULFATE 4 GM/100ML IV SOLN
4.0000 g | Freq: Once | INTRAVENOUS | Status: AC
  Administered 2021-11-12: 4 g via INTRAVENOUS
  Filled 2021-11-12: qty 100

## 2021-11-12 MED ORDER — HEPARIN SOD (PORK) LOCK FLUSH 100 UNIT/ML IV SOLN
500.0000 [IU] | Freq: Once | INTRAVENOUS | Status: AC | PRN
  Administered 2021-11-12: 500 [IU]

## 2021-11-12 MED ORDER — SODIUM CHLORIDE 0.9 % IV SOLN
318.0000 mg | Freq: Once | INTRAVENOUS | Status: AC
  Administered 2021-11-12: 320 mg via INTRAVENOUS
  Filled 2021-11-12: qty 32

## 2021-11-12 MED ORDER — SODIUM CHLORIDE 0.9 % IV SOLN: Freq: Once | INTRAVENOUS | Status: AC

## 2021-11-12 MED ORDER — FAMOTIDINE IN NACL 20-0.9 MG/50ML-% IV SOLN
20.0000 mg | Freq: Once | INTRAVENOUS | Status: AC
  Administered 2021-11-12: 20 mg via INTRAVENOUS
  Filled 2021-11-12: qty 50

## 2021-11-12 MED ORDER — SODIUM CHLORIDE 0.9 % IV SOLN
10.0000 mg | Freq: Once | INTRAVENOUS | Status: AC
  Administered 2021-11-12: 10 mg via INTRAVENOUS
  Filled 2021-11-12: qty 10

## 2021-11-12 MED ORDER — PALONOSETRON HCL INJECTION 0.25 MG/5ML
0.2500 mg | Freq: Once | INTRAVENOUS | Status: AC
  Administered 2021-11-12: 0.25 mg via INTRAVENOUS
  Filled 2021-11-12: qty 5

## 2021-11-12 MED ORDER — SODIUM CHLORIDE 0.9% FLUSH
10.0000 mL | INTRAVENOUS | Status: DC | PRN
  Administered 2021-11-12: 10 mL

## 2021-11-12 MED ORDER — SODIUM CHLORIDE 0.9 % IV SOLN
150.0000 mg | Freq: Once | INTRAVENOUS | Status: AC
  Administered 2021-11-12: 150 mg via INTRAVENOUS
  Filled 2021-11-12: qty 150

## 2021-11-12 NOTE — Patient Instructions (Signed)
[facility]  Discharge Instructions: Thank you for choosing [facility] to provide your oncology and hematology care.   If you have a lab appointment with the [facility], please go directly to the [facility] and check in at the registration area.   Wear comfortable clothing and clothing appropriate for easy access to any Portacath or PICC line.   We strive to give you quality time with your provider. You may need to reschedule your appointment if you arrive late (15 or more minutes).  Arriving late affects you and other patients whose appointments are after yours.  Also, if you miss three or more appointments without notifying the office, you may be dismissed from the clinic at the provider's discretion.      For prescription refill requests, have your pharmacy contact our office and allow 72 hours for refills to be completed.    Today you received the following chemotherapy and/or immunotherapy agents: Paclitaxel, carboplatin      To help prevent nausea and vomiting after your treatment, we encourage you to take your nausea medication as directed.  BELOW ARE SYMPTOMS THAT SHOULD BE REPORTED IMMEDIATELY: *FEVER GREATER THAN 100.4 F (38 C) OR HIGHER *CHILLS OR SWEATING *NAUSEA AND VOMITING THAT IS NOT CONTROLLED WITH YOUR NAUSEA MEDICATION *UNUSUAL SHORTNESS OF BREATH *UNUSUAL BRUISING OR BLEEDING *URINARY PROBLEMS (pain or burning when urinating, or frequent urination) *BOWEL PROBLEMS (unusual diarrhea, constipation, pain near the anus) TENDERNESS IN MOUTH AND THROAT WITH OR WITHOUT PRESENCE OF ULCERS (sore throat, sores in mouth, or a toothache) UNUSUAL RASH, SWELLING OR PAIN  UNUSUAL VAGINAL DISCHARGE OR ITCHING   Items with * indicate a potential emergency and should be followed up as soon as possible or go to the Emergency Department if any problems should occur.  Please show the CHEMOTHERAPY ALERT CARD or IMMUNOTHERAPY ALERT CARD at  check-in to the Emergency Department and triage nurse.  Should you have questions after your visit or need to cancel or reschedule your appointment, please contact [facility]  Dept: [phone]  and follow the prompts.  Office hours are 8:00 a.m. to 4:30 p.m. Monday - Friday. Please note that voicemails left after 4:00 p.m. may not be returned until the following business day.  We are closed weekends and major holidays. You have access to a nurse at all times for urgent questions. Please call the main number to the clinic Dept: [phone] and follow the prompts.   For any non-urgent questions, you may also contact your provider using MyChart. We now offer e-Visits for anyone 41 and older to request care online for non-urgent symptoms. For details visit [email or web address].   Also download the MyChart app! Go to the app store, search "MyChart", open the app, select [facility], and log in with your MyChart username and password.  Masks are optional in the cancer centers. If you would like for your care team to wear a mask while they are taking care of you, please let them know. You may have one support person who is at least 79 years old accompany you for your appointments.  Paclitaxel Injection What is this medication? PACLITAXEL (PAK li TAX el) treats some types of cancer. It works by slowing down the growth of cancer cells. This medicine may be used for other purposes; ask your health care provider or pharmacist if you have questions. COMMON BRAND NAME(S): Onxol, Taxol What should I tell my care team before I take  this medication? They need to know if you have any of these conditions: Heart disease Liver disease Low white blood cell levels An unusual or allergic reaction to paclitaxel, other medications, foods, dyes, or preservatives If you or your partner are pregnant or trying to get pregnant Breast-feeding How should I use this medication? This  medication is injected into a vein. It is given by your care team in a hospital or clinic setting. Talk to your care team about the use of this medication in children. While it may be given to children for selected conditions, precautions do apply. Overdosage: If you think you have taken too much of this medicine contact a poison control center or emergency room at once. NOTE: This medicine is only for you. Do not share this medicine with others. What if I miss a dose? Keep appointments for follow-up doses. It is important not to miss your dose. Call your care team if you are unable to keep an appointment. What may interact with this medication? Do not take this medication with any of the following: Live virus vaccines Other medications may affect the way this medication works. Talk with your care team about all of the medications you take. They may suggest changes to your treatment plan to lower the risk of side effects and to make sure your medications work as intended. This list may not describe all possible interactions. Give your health care provider a list of all the medicines, herbs, non-prescription drugs, or dietary supplements you use. Also tell them if you smoke, drink alcohol, or use illegal drugs. Some items may interact with your medicine. What should I watch for while using this medication? Your condition will be monitored carefully while you are receiving this medication. You may need blood work while taking this medication. This medication may make you feel generally unwell. This is not uncommon as chemotherapy can affect healthy cells as well as cancer cells. Report any side effects. Continue your course of treatment even though you feel ill unless your care team tells you to stop. This medication can cause serious allergic reactions. To reduce the risk, your care team may give you other medications to take before receiving this one. Be sure to follow the directions from your care  team. This medication may increase your risk of getting an infection. Call your care team for advice if you get a fever, chills, sore throat, or other symptoms of a cold or flu. Do not treat yourself. Try to avoid being around people who are sick. This medication may increase your risk to bruise or bleed. Call your care team if you notice any unusual bleeding. Be careful brushing or flossing your teeth or using a toothpick because you may get an infection or bleed more easily. If you have any dental work done, tell your dentist you are receiving this medication. Talk to your care team if you may be pregnant. Serious birth defects can occur if you take this medication during pregnancy. Talk to your care team before breastfeeding. Changes to your treatment plan may be needed. What side effects may I notice from receiving this medication? Side effects that you should report to your care team as soon as possible: Allergic reactions--skin rash, itching, hives, swelling of the face, lips, tongue, or throat Heart rhythm changes--fast or irregular heartbeat, dizziness, feeling faint or lightheaded, chest pain, trouble breathing Increase in blood pressure Infection--fever, chills, cough, sore throat, wounds that don't heal, pain or trouble when passing urine, general feeling   of discomfort or being unwell Low blood pressure--dizziness, feeling faint or lightheaded, blurry vision Low red blood cell level--unusual weakness or fatigue, dizziness, headache, trouble breathing Painful swelling, warmth, or redness of the skin, blisters or sores at the infusion site Pain, tingling, or numbness in the hands or feet Slow heartbeat--dizziness, feeling faint or lightheaded, confusion, trouble breathing, unusual weakness or fatigue Unusual bruising or bleeding Side effects that usually do not require medical attention (report to your care team if they continue or are bothersome): Diarrhea Hair loss Joint pain Loss of  appetite Muscle pain Nausea Vomiting This list may not describe all possible side effects. Call your doctor for medical advice about side effects. You may report side effects to FDA at 1-800-FDA-1088. Where should I keep my medication? This medication is given in a hospital or clinic. It will not be stored at home. NOTE: This sheet is a summary. It may not cover all possible information. If you have questions about this medicine, talk to your doctor, pharmacist, or health care provider.  2023 Elsevier/Gold Standard (2021-06-25 00:00:00)  Carboplatin Injection What is this medication? CARBOPLATIN (KAR boe pla tin) treats some types of cancer. It works by slowing down the growth of cancer cells. This medicine may be used for other purposes; ask your health care provider or pharmacist if you have questions. COMMON BRAND NAME(S): Paraplatin What should I tell my care team before I take this medication? They need to know if you have any of these conditions: Blood disorders Hearing problems Kidney disease Recent or ongoing radiation therapy An unusual or allergic reaction to carboplatin, cisplatin, other medications, foods, dyes, or preservatives Pregnant or trying to get pregnant Breast-feeding How should I use this medication? This medication is injected into a vein. It is given by your care team in a hospital or clinic setting. Talk to your care team about the use of this medication in children. Special care may be needed. Overdosage: If you think you have taken too much of this medicine contact a poison control center or emergency room at once. NOTE: This medicine is only for you. Do not share this medicine with others. What if I miss a dose? Keep appointments for follow-up doses. It is important not to miss your dose. Call your care team if you are unable to keep an appointment. What may interact with this medication? Medications for seizures Some antibiotics, such as amikacin,  gentamicin, neomycin, streptomycin, tobramycin Vaccines This list may not describe all possible interactions. Give your health care provider a list of all the medicines, herbs, non-prescription drugs, or dietary supplements you use. Also tell them if you smoke, drink alcohol, or use illegal drugs. Some items may interact with your medicine. What should I watch for while using this medication? Your condition will be monitored carefully while you are receiving this medication. You may need blood work while taking this medication. This medication may make you feel generally unwell. This is not uncommon, as chemotherapy can affect healthy cells as well as cancer cells. Report any side effects. Continue your course of treatment even though you feel ill unless your care team tells you to stop. In some cases, you may be given additional medications to help with side effects. Follow all directions for their use. This medication may increase your risk of getting an infection. Call your care team for advice if you get a fever, chills, sore throat, or other symptoms of a cold or flu. Do not treat yourself. Try to avoid  being around people who are sick. Avoid taking medications that contain aspirin, acetaminophen, ibuprofen, naproxen, or ketoprofen unless instructed by your care team. These medications may hide a fever. Be careful brushing or flossing your teeth or using a toothpick because you may get an infection or bleed more easily. If you have any dental work done, tell your dentist you are receiving this medication. Talk to your care team if you wish to become pregnant or think you might be pregnant. This medication can cause serious birth defects. Talk to your care team about effective forms of contraception. Do not breast-feed while taking this medication. What side effects may I notice from receiving this medication? Side effects that you should report to your care team as soon as possible: Allergic  reactions--skin rash, itching, hives, swelling of the face, lips, tongue, or throat Infection--fever, chills, cough, sore throat, wounds that don't heal, pain or trouble when passing urine, general feeling of discomfort or being unwell Low red blood cell level--unusual weakness or fatigue, dizziness, headache, trouble breathing Pain, tingling, or numbness in the hands or feet, muscle weakness, change in vision, confusion or trouble speaking, loss of balance or coordination, trouble walking, seizures Unusual bruising or bleeding Side effects that usually do not require medical attention (report to your care team if they continue or are bothersome): Hair loss Nausea Unusual weakness or fatigue Vomiting This list may not describe all possible side effects. Call your doctor for medical advice about side effects. You may report side effects to FDA at 1-800-FDA-1088. Where should I keep my medication? This medication is given in a hospital or clinic. It will not be stored at home. NOTE: This sheet is a summary. It may not cover all possible information. If you have questions about this medicine, talk to your doctor, pharmacist, or health care provider.  2023 Elsevier/Gold Standard (2021-06-02 00:00:00)  Magnesium Sulfate Injection What is this medication? MAGNESIUM SULFATE (mag NEE zee um SUL fate) prevents and treats low levels of magnesium in your body. It may also be used to prevent and treat seizures during pregnancy in people with high blood pressure disorders, such as preeclampsia or eclampsia. Magnesium plays an important role in maintaining the health of your muscles and nervous system. This medicine may be used for other purposes; ask your health care provider or pharmacist if you have questions. What should I tell my care team before I take this medication? They need to know if you have any of these conditions: Heart disease History of irregular heart beat Kidney disease An unusual or  allergic reaction to magnesium sulfate, medications, foods, dyes, or preservatives Pregnant or trying to get pregnant Breast-feeding How should I use this medication? This medication is for infusion into a vein. It is given in a hospital or clinic setting. Talk to your care team about the use of this medication in children. While this medication may be prescribed for selected conditions, precautions do apply. Overdosage: If you think you have taken too much of this medicine contact a poison control center or emergency room at once. NOTE: This medicine is only for you. Do not share this medicine with others. What if I miss a dose? This does not apply. What may interact with this medication? Certain medications for anxiety or sleep Certain medications for seizures like phenobarbital Digoxin Medications that relax muscles for surgery Narcotic medications for pain This list may not describe all possible interactions. Give your health care provider a list of all the medicines, herbs,  non-prescription drugs, or dietary supplements you use. Also tell them if you smoke, drink alcohol, or use illegal drugs. Some items may interact with your medicine. What should I watch for while using this medication? Your condition will be monitored carefully while you are receiving this medication. You may need blood work done while you are receiving this medication. What side effects may I notice from receiving this medication? Side effects that you should report to your care team as soon as possible: Allergic reactions--skin rash, itching, hives, swelling of the face, lips, tongue, or throat High magnesium level--confusion, drowsiness, facial flushing, redness, sweating, muscle weakness, fast or irregular heartbeat, trouble breathing Low blood pressure--dizziness, feeling faint or lightheaded, blurry vision Side effects that usually do not require medical attention (report to your care team if they continue or  are bothersome): Headache Nausea This list may not describe all possible side effects. Call your doctor for medical advice about side effects. You may report side effects to FDA at 1-800-FDA-1088. Where should I keep my medication? This medication is given in a hospital or clinic and will not be stored at home. NOTE: This sheet is a summary. It may not cover all possible information. If you have questions about this medicine, talk to your doctor, pharmacist, or health care provider.  2023 Elsevier/Gold Standard (2020-04-17 00:00:00)

## 2021-11-13 ENCOUNTER — Inpatient Hospital Stay: Payer: Medicare HMO

## 2021-11-13 ENCOUNTER — Telehealth: Payer: Self-pay | Admitting: *Deleted

## 2021-11-13 VITALS — BP 146/81 | HR 86 | Temp 98.9°F | Resp 18

## 2021-11-13 DIAGNOSIS — R112 Nausea with vomiting, unspecified: Secondary | ICD-10-CM | POA: Diagnosis not present

## 2021-11-13 DIAGNOSIS — E039 Hypothyroidism, unspecified: Secondary | ICD-10-CM | POA: Diagnosis not present

## 2021-11-13 DIAGNOSIS — Z5189 Encounter for other specified aftercare: Secondary | ICD-10-CM | POA: Diagnosis not present

## 2021-11-13 DIAGNOSIS — E860 Dehydration: Secondary | ICD-10-CM | POA: Diagnosis not present

## 2021-11-13 DIAGNOSIS — Z801 Family history of malignant neoplasm of trachea, bronchus and lung: Secondary | ICD-10-CM | POA: Diagnosis not present

## 2021-11-13 DIAGNOSIS — C579 Malignant neoplasm of female genital organ, unspecified: Secondary | ICD-10-CM | POA: Diagnosis not present

## 2021-11-13 DIAGNOSIS — Z800 Family history of malignant neoplasm of digestive organs: Secondary | ICD-10-CM | POA: Diagnosis not present

## 2021-11-13 DIAGNOSIS — I959 Hypotension, unspecified: Secondary | ICD-10-CM | POA: Diagnosis not present

## 2021-11-13 DIAGNOSIS — Z853 Personal history of malignant neoplasm of breast: Secondary | ICD-10-CM | POA: Diagnosis not present

## 2021-11-13 DIAGNOSIS — C481 Malignant neoplasm of specified parts of peritoneum: Secondary | ICD-10-CM

## 2021-11-13 DIAGNOSIS — K56609 Unspecified intestinal obstruction, unspecified as to partial versus complete obstruction: Secondary | ICD-10-CM | POA: Diagnosis not present

## 2021-11-13 DIAGNOSIS — I10 Essential (primary) hypertension: Secondary | ICD-10-CM | POA: Diagnosis not present

## 2021-11-13 DIAGNOSIS — K2270 Barrett's esophagus without dysplasia: Secondary | ICD-10-CM | POA: Diagnosis not present

## 2021-11-13 DIAGNOSIS — M899 Disorder of bone, unspecified: Secondary | ICD-10-CM | POA: Diagnosis not present

## 2021-11-13 DIAGNOSIS — R799 Abnormal finding of blood chemistry, unspecified: Secondary | ICD-10-CM | POA: Diagnosis not present

## 2021-11-13 DIAGNOSIS — R531 Weakness: Secondary | ICD-10-CM | POA: Diagnosis not present

## 2021-11-13 DIAGNOSIS — Z5111 Encounter for antineoplastic chemotherapy: Secondary | ICD-10-CM | POA: Diagnosis not present

## 2021-11-13 DIAGNOSIS — C799 Secondary malignant neoplasm of unspecified site: Secondary | ICD-10-CM | POA: Diagnosis not present

## 2021-11-13 DIAGNOSIS — Z931 Gastrostomy status: Secondary | ICD-10-CM | POA: Diagnosis not present

## 2021-11-13 DIAGNOSIS — R918 Other nonspecific abnormal finding of lung field: Secondary | ICD-10-CM | POA: Diagnosis not present

## 2021-11-13 DIAGNOSIS — R971 Elevated cancer antigen 125 [CA 125]: Secondary | ICD-10-CM | POA: Diagnosis not present

## 2021-11-13 DIAGNOSIS — C786 Secondary malignant neoplasm of retroperitoneum and peritoneum: Secondary | ICD-10-CM | POA: Diagnosis not present

## 2021-11-13 MED ORDER — PEGFILGRASTIM-JMDB 6 MG/0.6ML ~~LOC~~ SOSY
6.0000 mg | PREFILLED_SYRINGE | Freq: Once | SUBCUTANEOUS | Status: AC
  Administered 2021-11-13: 6 mg via SUBCUTANEOUS
  Filled 2021-11-13: qty 0.6

## 2021-11-13 NOTE — Patient Instructions (Signed)

## 2021-11-13 NOTE — Telephone Encounter (Signed)
Nancy Harrison called to request navigator call him on Monday. He has information for her re: Chiropractor.

## 2021-11-13 NOTE — Progress Notes (Signed)
24 Hour Post Chemo Follow Up  Patient presents today for her Fulphila injection. She denies any side effects. She admits feeling fatigued and some facial flushing. This Probation officer advised patient that this is expected with her treatment and the facial flushing is caused by the steroid (decadron) which she received as pre-medication and would resolve after a few days. Patient and spouse were educated on the side effects of the injection she received today. She stated that she takes Claritin daily, this writer advised that she could take some tylenol in addition to help relieve any bone or joint pain that may occur as adverse effect of the injection, patient verbalized understanding and agreement. Written education printed and provided to patient. She knows to call with any complain or concerns.

## 2021-11-14 LAB — CA 125: Cancer Antigen (CA) 125: 738 U/mL — ABNORMAL HIGH (ref 0.0–38.1)

## 2021-11-16 ENCOUNTER — Encounter (HOSPITAL_COMMUNITY): Payer: Self-pay | Admitting: Gynecologic Oncology

## 2021-11-16 ENCOUNTER — Other Ambulatory Visit: Payer: Self-pay | Admitting: *Deleted

## 2021-11-16 NOTE — Progress Notes (Signed)
The proposed treatment discussed in conference is for discussion purpose only and is not a binding recommendation.  The patients have not been physically examined, or presented with their treatment options.  Therefore, final treatment plans cannot be decided.  

## 2021-11-17 ENCOUNTER — Encounter: Payer: Self-pay | Admitting: Gynecologic Oncology

## 2021-11-17 DIAGNOSIS — Z993 Dependence on wheelchair: Secondary | ICD-10-CM | POA: Diagnosis not present

## 2021-11-17 DIAGNOSIS — Z483 Aftercare following surgery for neoplasm: Secondary | ICD-10-CM | POA: Diagnosis not present

## 2021-11-17 DIAGNOSIS — Z432 Encounter for attention to ileostomy: Secondary | ICD-10-CM | POA: Diagnosis not present

## 2021-11-17 DIAGNOSIS — Z431 Encounter for attention to gastrostomy: Secondary | ICD-10-CM | POA: Diagnosis not present

## 2021-11-17 DIAGNOSIS — M1990 Unspecified osteoarthritis, unspecified site: Secondary | ICD-10-CM | POA: Diagnosis not present

## 2021-11-17 DIAGNOSIS — Z853 Personal history of malignant neoplasm of breast: Secondary | ICD-10-CM | POA: Diagnosis not present

## 2021-11-17 DIAGNOSIS — H269 Unspecified cataract: Secondary | ICD-10-CM | POA: Diagnosis not present

## 2021-11-17 DIAGNOSIS — H538 Other visual disturbances: Secondary | ICD-10-CM | POA: Diagnosis not present

## 2021-11-17 DIAGNOSIS — C786 Secondary malignant neoplasm of retroperitoneum and peritoneum: Secondary | ICD-10-CM | POA: Diagnosis not present

## 2021-11-17 DIAGNOSIS — I10 Essential (primary) hypertension: Secondary | ICD-10-CM | POA: Diagnosis not present

## 2021-11-17 DIAGNOSIS — R69 Illness, unspecified: Secondary | ICD-10-CM | POA: Diagnosis not present

## 2021-11-17 DIAGNOSIS — K6389 Other specified diseases of intestine: Secondary | ICD-10-CM | POA: Diagnosis not present

## 2021-11-17 DIAGNOSIS — E785 Hyperlipidemia, unspecified: Secondary | ICD-10-CM | POA: Diagnosis not present

## 2021-11-17 DIAGNOSIS — C184 Malignant neoplasm of transverse colon: Secondary | ICD-10-CM | POA: Diagnosis not present

## 2021-11-17 DIAGNOSIS — Z87891 Personal history of nicotine dependence: Secondary | ICD-10-CM | POA: Diagnosis not present

## 2021-11-17 DIAGNOSIS — K2270 Barrett's esophagus without dysplasia: Secondary | ICD-10-CM | POA: Diagnosis not present

## 2021-11-17 DIAGNOSIS — Z9181 History of falling: Secondary | ICD-10-CM | POA: Diagnosis not present

## 2021-11-17 DIAGNOSIS — Z96653 Presence of artificial knee joint, bilateral: Secondary | ICD-10-CM | POA: Diagnosis not present

## 2021-11-18 ENCOUNTER — Inpatient Hospital Stay: Payer: Medicare HMO

## 2021-11-18 ENCOUNTER — Ambulatory Visit: Payer: Medicare HMO | Admitting: Nutrition

## 2021-11-18 ENCOUNTER — Inpatient Hospital Stay (HOSPITAL_BASED_OUTPATIENT_CLINIC_OR_DEPARTMENT_OTHER): Payer: Medicare HMO | Admitting: Oncology

## 2021-11-18 ENCOUNTER — Encounter: Payer: Self-pay | Admitting: *Deleted

## 2021-11-18 ENCOUNTER — Other Ambulatory Visit: Payer: Self-pay | Admitting: *Deleted

## 2021-11-18 VITALS — BP 148/77 | HR 104 | Temp 97.1°F | Resp 16 | Ht 59.0 in | Wt 127.6 lb

## 2021-11-18 DIAGNOSIS — E039 Hypothyroidism, unspecified: Secondary | ICD-10-CM | POA: Diagnosis not present

## 2021-11-18 DIAGNOSIS — H538 Other visual disturbances: Secondary | ICD-10-CM | POA: Diagnosis not present

## 2021-11-18 DIAGNOSIS — Z800 Family history of malignant neoplasm of digestive organs: Secondary | ICD-10-CM | POA: Diagnosis not present

## 2021-11-18 DIAGNOSIS — Z87891 Personal history of nicotine dependence: Secondary | ICD-10-CM | POA: Diagnosis not present

## 2021-11-18 DIAGNOSIS — C786 Secondary malignant neoplasm of retroperitoneum and peritoneum: Secondary | ICD-10-CM

## 2021-11-18 DIAGNOSIS — K6389 Other specified diseases of intestine: Secondary | ICD-10-CM | POA: Diagnosis not present

## 2021-11-18 DIAGNOSIS — Z853 Personal history of malignant neoplasm of breast: Secondary | ICD-10-CM | POA: Diagnosis not present

## 2021-11-18 DIAGNOSIS — R799 Abnormal finding of blood chemistry, unspecified: Secondary | ICD-10-CM | POA: Diagnosis not present

## 2021-11-18 DIAGNOSIS — M1990 Unspecified osteoarthritis, unspecified site: Secondary | ICD-10-CM | POA: Diagnosis not present

## 2021-11-18 DIAGNOSIS — K56609 Unspecified intestinal obstruction, unspecified as to partial versus complete obstruction: Secondary | ICD-10-CM | POA: Diagnosis not present

## 2021-11-18 DIAGNOSIS — Z993 Dependence on wheelchair: Secondary | ICD-10-CM | POA: Diagnosis not present

## 2021-11-18 DIAGNOSIS — C481 Malignant neoplasm of specified parts of peritoneum: Secondary | ICD-10-CM

## 2021-11-18 DIAGNOSIS — M899 Disorder of bone, unspecified: Secondary | ICD-10-CM | POA: Diagnosis not present

## 2021-11-18 DIAGNOSIS — Z9181 History of falling: Secondary | ICD-10-CM | POA: Diagnosis not present

## 2021-11-18 DIAGNOSIS — E785 Hyperlipidemia, unspecified: Secondary | ICD-10-CM | POA: Diagnosis not present

## 2021-11-18 DIAGNOSIS — R918 Other nonspecific abnormal finding of lung field: Secondary | ICD-10-CM | POA: Diagnosis not present

## 2021-11-18 DIAGNOSIS — I959 Hypotension, unspecified: Secondary | ICD-10-CM | POA: Diagnosis not present

## 2021-11-18 DIAGNOSIS — C184 Malignant neoplasm of transverse colon: Secondary | ICD-10-CM | POA: Diagnosis not present

## 2021-11-18 DIAGNOSIS — Z5189 Encounter for other specified aftercare: Secondary | ICD-10-CM | POA: Diagnosis not present

## 2021-11-18 DIAGNOSIS — Z801 Family history of malignant neoplasm of trachea, bronchus and lung: Secondary | ICD-10-CM | POA: Diagnosis not present

## 2021-11-18 DIAGNOSIS — R69 Illness, unspecified: Secondary | ICD-10-CM | POA: Diagnosis not present

## 2021-11-18 DIAGNOSIS — Z432 Encounter for attention to ileostomy: Secondary | ICD-10-CM | POA: Diagnosis not present

## 2021-11-18 DIAGNOSIS — K2270 Barrett's esophagus without dysplasia: Secondary | ICD-10-CM | POA: Diagnosis not present

## 2021-11-18 DIAGNOSIS — I10 Essential (primary) hypertension: Secondary | ICD-10-CM | POA: Diagnosis not present

## 2021-11-18 DIAGNOSIS — E860 Dehydration: Secondary | ICD-10-CM | POA: Diagnosis not present

## 2021-11-18 DIAGNOSIS — Z5111 Encounter for antineoplastic chemotherapy: Secondary | ICD-10-CM | POA: Diagnosis not present

## 2021-11-18 DIAGNOSIS — R531 Weakness: Secondary | ICD-10-CM | POA: Diagnosis not present

## 2021-11-18 DIAGNOSIS — R112 Nausea with vomiting, unspecified: Secondary | ICD-10-CM | POA: Diagnosis not present

## 2021-11-18 DIAGNOSIS — Z96653 Presence of artificial knee joint, bilateral: Secondary | ICD-10-CM | POA: Diagnosis not present

## 2021-11-18 DIAGNOSIS — H269 Unspecified cataract: Secondary | ICD-10-CM | POA: Diagnosis not present

## 2021-11-18 DIAGNOSIS — Z431 Encounter for attention to gastrostomy: Secondary | ICD-10-CM | POA: Diagnosis not present

## 2021-11-18 DIAGNOSIS — Z931 Gastrostomy status: Secondary | ICD-10-CM | POA: Diagnosis not present

## 2021-11-18 DIAGNOSIS — C579 Malignant neoplasm of female genital organ, unspecified: Secondary | ICD-10-CM | POA: Diagnosis not present

## 2021-11-18 DIAGNOSIS — R971 Elevated cancer antigen 125 [CA 125]: Secondary | ICD-10-CM | POA: Diagnosis not present

## 2021-11-18 DIAGNOSIS — Z483 Aftercare following surgery for neoplasm: Secondary | ICD-10-CM | POA: Diagnosis not present

## 2021-11-18 LAB — CMP (CANCER CENTER ONLY)
ALT: 11 U/L (ref 0–44)
AST: 29 U/L (ref 15–41)
Albumin: 3.7 g/dL (ref 3.5–5.0)
Alkaline Phosphatase: 69 U/L (ref 38–126)
Anion gap: 11 (ref 5–15)
BUN: 10 mg/dL (ref 8–23)
CO2: 22 mmol/L (ref 22–32)
Calcium: 9.1 mg/dL (ref 8.9–10.3)
Chloride: 95 mmol/L — ABNORMAL LOW (ref 98–111)
Creatinine: 0.5 mg/dL (ref 0.44–1.00)
GFR, Estimated: >60 mL/min (ref >60)
Glucose, Bld: 95 mg/dL (ref 70–99)
Potassium: 4.1 mmol/L (ref 3.5–5.1)
Sodium: 128 mmol/L — ABNORMAL LOW (ref 135–145)
Total Bilirubin: 0.4 mg/dL (ref 0.3–1.2)
Total Protein: 6.3 g/dL — ABNORMAL LOW (ref 6.5–8.1)

## 2021-11-18 LAB — MAGNESIUM: Magnesium: 1.3 mg/dL — ABNORMAL LOW (ref 1.7–2.4)

## 2021-11-18 MED ORDER — HEPARIN SOD (PORK) LOCK FLUSH 100 UNIT/ML IV SOLN
500.0000 [IU] | Freq: Once | INTRAVENOUS | Status: AC
  Administered 2021-11-18: 500 [IU] via INTRAVENOUS

## 2021-11-18 MED ORDER — SODIUM CHLORIDE 0.9 % IV SOLN: Freq: Once | INTRAVENOUS | Status: AC

## 2021-11-18 MED ORDER — SODIUM CHLORIDE 0.9 % IV SOLN
10.0000 mg | Freq: Once | INTRAVENOUS | Status: AC
  Administered 2021-11-18: 10 mg via INTRAVENOUS
  Filled 2021-11-18: qty 1

## 2021-11-18 MED ORDER — SODIUM CHLORIDE 0.9% FLUSH
10.0000 mL | Freq: Once | INTRAVENOUS | Status: AC
  Administered 2021-11-18: 10 mL via INTRAVENOUS

## 2021-11-18 MED ORDER — SODIUM CHLORIDE 0.9 % IV SOLN
10.0000 mg | Freq: Once | INTRAVENOUS | Status: AC
  Filled 2021-11-18: qty 1

## 2021-11-18 MED ORDER — MAGNESIUM SULFATE 4 GM/100ML IV SOLN
4.0000 g | Freq: Once | INTRAVENOUS | Status: AC
  Administered 2021-11-18: 4 g via INTRAVENOUS
  Filled 2021-11-18: qty 100

## 2021-11-18 NOTE — Patient Instructions (Signed)
Rehydration, Elderly Rehydration is the replacement of body fluids, salts, and minerals (electrolytes) that are lost during dehydration. Dehydration is when there is not enough water or other fluids in the body. This happens when you lose more fluids than you take in. People who are age 79 or older have a higher risk of dehydration than younger adults. Common causes of dehydration include: Conditions that cause loss of water or other fluids, such as diarrhea, vomiting, sweating, or urinating a lot. Not drinking enough fluids. This can occur when you are ill or doing activities that require a lot of energy, especially in hot weather. Other illnesses and conditions, such as fever or infection. Certain medicines, such as those that remove excess fluid from the body (diuretics). Not being able to get enough water and food. Symptoms of mild or moderate dehydration may include thirst, dry lips and mouth, and dizziness. Symptoms of severe dehydration may include increased heart rate, confusion, fainting, and not urinating. For severe dehydration, you may need to get fluids through an IV at the hospital. For mild or moderate dehydration, you can usually rehydrate at home by drinking certain fluids as told by your health care provider. What are the risks? Generally, rehydration is safe. However, taking in too much fluid (overhydration) can be a problem. This is rare. Overhydration can cause an electrolyte imbalance, kidney failure, fluid in the lungs, or a decrease in salt (sodium) levels in the body. Supplies needed: You will need an oral rehydration solution (ORS) if your health care provider tells you to use one. This is a drink designed to treat dehydration. It can be found in pharmacies and retail stores. How to rehydrate Fluids Follow instructions from your health care provider for rehydration. The kind of fluid and the amount you should drink depend on your condition. In general, for mild dehydration,  you should choose drinks that you prefer. If told by your health care provider, drink an ORS. Make an ORS by following instructions on the package. Start by drinking small amounts, about  cup (120 mL) every 5-10 minutes. Slowly increase how much you drink until you have taken the amount recommended by your health care provider. Drink enough fluids to keep your urine pale yellow. If you were told to drink an ORS, finish the ORS first, then start slowly drinking other clear fluids. Drink fluids such as: Water. This includes sparkling water and flavored water. Drinking only waterwhile rehydrating can lead to having too little sodium in your body (hyponatremia). Follow instructions from your health care provider. Water from ice chips you suck on. Fruit juice with water you add to it(diluted). Sports drinks. Hot or cold herbal teas. Broth-based soups. Coffee. Milk or milk products. Food Follow instructions from your health care provider about what to eat while you rehydrate. Your health care provider may recommend that you slowly begin eating regular foods in small amounts. Eat foods that contain a healthy balance of electrolytes, such as bananas, oranges, potatoes, tomatoes, and spinach. Avoid foods that are greasy or contain a lot of sugar. In some cases, you may get nutrition through a feeding tube that is passed through your nose and into your stomach (nasogastric tube, or NG tube). This may be done if you have uncontrolled vomiting or diarrhea. Beverages to avoid  Certain beverages may make dehydration worse. While you rehydrate, avoid drinking alcohol. How to tell if you are recovering from dehydration You may be recovering from dehydration if: You are urinating more often than before  you started rehydrating. Your urine is pale yellow. Your energy level improves. You vomit less frequently. You have diarrhea less frequently. Your appetite improves or returns to normal. You feel less  dizzy or less light-headed. Your skin tone and color start to look more normal. Follow these instructions at home: Take over-the-counter and prescription medicines only as told by your health care provider. Do not take sodium tablets. Doing this can lead to having too much sodium in your body (hypernatremia). Contact a health care provider if: You continue to have symptoms of mild or moderate dehydration, such as: Thirst. Dry lips. Slightly dry mouth. Dizziness. Dark urine or less urine than usual. Muscle cramps. You continue to vomit or have diarrhea. Get help right away if you: Have symptoms of dehydration that get worse. Have a fever. Have a severe headache. Have been vomiting and the following happens: Your vomiting gets worse. Your vomit includes blood or green matter (bile). You cannot eat or drink without vomiting. Have problems with urination or bowel movements, such as: Diarrhea that gets worse. Blood in your stool (feces). This may cause stool to look black and tarry. Not urinating, or urinating only a small amount of very dark urine, within 6-8 hours. Have trouble breathing. Have symptoms that get worse with treatment. These symptoms may represent a serious problem that is an emergency. Do not wait to see if the symptoms will go away. Get medical help right away. Call your local emergency services (911 in the [place] Do not drive yourself to the hospital. Summary Rehydration is the replacement of body fluids, salts, and minerals (electrolytes) that are lost during dehydration. Follow instructions from your health care provider for rehydration. The kind of fluid and the amount you should drink depend on your condition. Slowly increase how much you drink until you have taken the amount recommended by your health care provider. Contact your health care provider if you continue to show signs of mild or moderate dehydration. This information is not intended to replace advice  given to you by your health care provider. Make sure you discuss any questions you have with your health care provider. Document Revised: 04/11/2019 Document Reviewed: 03/29/2019 Elsevier Patient Education  [place].  Hypomagnesemia Hypomagnesemia is a condition in which the level of magnesium in the blood is too low. Magnesium is a mineral that is found in many foods. It is used in many different processes in the body. Hypomagnesemia can affect every organ in the body. In severe cases, it can cause life-threatening problems. What are the causes? This condition may be caused by: Not getting enough magnesium in your diet or not having enough healthy foods to eat (malnutrition). Problems with magnesium absorption in the intestines. Dehydration. Excessive use of alcohol. Vomiting. Severe or long-term (chronic) diarrhea. Some medicines, including medicines that make you urinate more often (diuretics). Certain diseases, such as kidney disease, diabetes, celiac disease, and overactive thyroid. What are the signs or symptoms? Symptoms of this condition include: Loss of appetite, nausea, and vomiting. Involuntary shaking or trembling of a body part (tremor). Muscle weakness or tingling in the arms and legs. Sudden tightening of muscles (muscle spasms). Confusion. Psychiatric issues, such as: Depression and irritability. Psychosis. A feeling of fluttering of the heart (palpitations). Seizures. These symptoms are more severe if magnesium levels drop suddenly. How is this diagnosed? This condition may be diagnosed based on: Your symptoms and medical history. A physical exam. Blood and urine tests. How is this treated? Treatment depends  on the cause and the severity of the condition. It may be treated by: Taking a magnesium supplement. This can be taken in pill form. If the condition is severe, magnesium is usually given through an IV. Making changes to your diet. You may be  directed to eat foods that have a lot of magnesium, such as green leafy vegetables, peas, beans, and nuts. Not drinking alcohol. If you are struggling not to drink, ask your health care provider for help. Follow these instructions at home: Eating and drinking     Make sure that your diet includes foods with magnesium. Foods that have a lot of magnesium in them include: Green leafy vegetables, such as spinach and broccoli. Beans and peas. Nuts and seeds, such as almonds and sunflower seeds. Whole grains, such as whole grain bread and fortified cereals. Drink fluids that contain salts and minerals (electrolytes), such as sports drinks, when you are active. Do not drink alcohol. General instructions Take over-the-counter and prescription medicines only as told by your health care provider. Take magnesium supplements as directed if your health care provider tells you to take them. Have your magnesium levels monitored as told by your health care provider. Keep all follow-up visits. This is important. Contact a health care provider if: You get worse instead of better. Your symptoms return. Get help right away if: You develop severe muscle weakness. You have trouble breathing. You feel that your heart is racing. These symptoms may represent a serious problem that is an emergency. Do not wait to see if the symptoms will go away. Get medical help right away. Call your local emergency services (911 in the [place] Do not drive yourself to the hospital. Summary Hypomagnesemia is a condition in which the level of magnesium in the blood is too low. Hypomagnesemia can affect every organ in the body. Treatment may include eating more foods that contain magnesium, taking magnesium supplements, and not drinking alcohol. Have your magnesium levels monitored as told by your health care provider. This information is not intended to replace advice given to you by your health care provider. Make sure you  discuss any questions you have with your health care provider. Document Revised: 07/08/2020 Document Reviewed: 07/08/2020 Elsevier Patient Education  [place].

## 2021-11-18 NOTE — Progress Notes (Signed)
Nancy [facility] PROGRESS NOTE   Diagnosis: Primary peritoneal carcinoma  INTERVAL HISTORY:   [name] completed cycle 1 Taxol/carboplatin on 11/12/2021.  She received G-CSF on 11/13/2021.  She reports mild discomfort in the extremities after receiving G-CSF.  She developed nausea a few days following chemotherapy.  Emesis began yesterday and continued today.  She use the venting gastrostomy tube earlier today.  She continues to have semiformed stool in the ileostomy.  She empties the bag approximately 3 times daily.  She is eating very little.  She is able to drink fluids.  The abdominal pain remains improved compared to 3 abdominal surgery.  Objective:  Vital signs in last 24 hours: 97.1, pulse 98, respirations 16, pressure 151/75    HEENT: The mucous membranes are moist, no thrush or ulcers Resp: Lungs clear bilaterally Cardio: Regular rate and rhythm GI: Mildly distended, left upper quadrant G-tube site with a gauze dressing.  Right abdomen ileostomy with semiformed brown stool Vascular: No leg edema Skin: Tape rash at the right lower neck/upper chest  Portacath/PICC-without erythema  Lab Results:  Lab Results  Component Value Date   WBC 6.2 11/11/2021   HGB 9.8 (L) 11/11/2021   HCT 28.4 (L) 11/11/2021   MCV 100.7 (H) 11/11/2021   PLT 312 11/11/2021   NEUTROABS 4.3 11/11/2021    CMP  Lab Results  Component Value Date   NA 128 (L) 11/18/2021   K 4.1 11/18/2021   CL 95 (L) 11/18/2021   CO2 22 11/18/2021   GLUCOSE 95 11/18/2021   BUN 10 11/18/2021   CREATININE 0.50 11/18/2021   CALCIUM 9.1 11/18/2021   PROT 6.3 (L) 11/18/2021   ALBUMIN 3.7 11/18/2021   AST 29 11/18/2021   ALT 11 11/18/2021   ALKPHOS 69 11/18/2021   BILITOT 0.4 11/18/2021   GFRNONAA >60 11/18/2021    Lab Results  Component Value Date   CEA1 2.5 10/18/2021    Medications: I have reviewed the patient's current medications.   Assessment/Plan: Primary peritoneal carcinoma  presenting with abdominal carcinomatosis resulting in colonic obstruction CT abdomen/pelvis 10/09/2021-irregular mass in the distal transverse colon with dilation of the transverse and right colon, and distal small bowel.  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  Bone lesions concerning for metastases, right lung nodule Laparoscopy, diverting loop ileostomy, gastrostomy tube placement, peritoneal biopsy 10/16/2021, no evidence of a primary tumor site at the appendix, ovaries, or uterus.  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  CDX2, CK20, and GATA3 negative.  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   Abdominal distention/pain and diarrhea secondary to #1 3.   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  Lumpectomy and adjuvant CMF x8 followed by radiation 4. Hypertension 5. Hypothyroidism 6. Barrett's esophagus 7. Family history of multiple cancers including appendix, colon, and lung cancer 8.  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  She underwent a diverting loop ileostomy and gastrostomy tube placement on 10/16/2021.  She completed  cycle 1 Taxol/carboplatin with G-CSF support beginning 11/12/2021.  [name] presents today with nausea and vomiting.  It is unclear whether the  nausea is related to chemotherapy or the peritoneal carcinoma.  She will receive intravenous fluids and Decadron in the chemotherapy room today.  We will be sure she is tolerating liquids prior to discharge from the Cancer center today.  She will receive magnesium supplementation today.  She will call for persistent nausea.  [name] will return for an Harrison visit as scheduled on 11/23/2021.  Nancy Coder, MD  11/18/2021  3:35 PM

## 2021-11-18 NOTE — Progress Notes (Signed)
81 year old female diagnosed with Peritoneal carcinoma and followed by Dr. Benay Spice.  Patient is receiving Taxol/Carbo and is S/P 1 treatment.  PMH includes HTN, Thyroid disease, Breast ca, Barrett's Esophagus.  Medications include Decadron, Lomotil, Imodium AD, MVI, Zofran and Compazine.  Labs include  Height: 4'11". Weight: 127 pounds 9.6 oz UBW: 135 pounds. BMI: 25.77.  Patient experienced nausea and vomiting 2 times today. She used her G tube to vent and felt better. Reports output is starting to thicken up. She tries to drink a lot of water but is not "there" yet. Typically eats a 1/2 bagel or english muffin and cereal or Eggs and sausage for breakfast, Cream Soup for Lunch and meat with starch and vegetable for dinner. She confirms receiving the nutrition fact sheet Diet and Nutrition After Ileostomy Placement given by nurse navigator.  Nutrition Diagnosis: Unintentional wt loss related to cancer and associated treatments as evidenced by 6% wt loss from usual body weight.  Intervention: Educated to consume small frequent meals and snacks throughout the day. Enforced importance of increased fluids and recommended ~80 oz daily. Medications as needed to control nausea and diarrhea. Tips on foods to eat if nauseated. Nutrition fact sheets provided. Contact information given.  Monitoring, Evaluation, Goals: Patient will tolerate po diet to minimize wt loss.  Next Visit: Wednesday, Oct 11, during infusion

## 2021-11-18 NOTE — Progress Notes (Signed)
PATIENT NAVIGATOR PROGRESS NOTE  Name: Nancy Harrison Date: 11/18/2021 MRN: [id number]  DOB: 11/12/1940   Reason for visit:  Telephone call from husband  Comments:  Mr Petraglia called and stated that pt is experiencing nausea and vomiting day 6 after chemo. She took ondansetron x 1 with little effect.He stated that he drained her G tube this am at 0930. She is taking imodium TID and output from ileostomy has form to it and is not liquid.  He states that she only took 2 bites of toast this am and is sipping on broth but no real intake.  Discussed with Dr Benay Spice and she will come to clinic this afternoon for labs, fluids and anti nausea meds  Verbalized understanding and agreement    Time spent counseling/coordinating care: 30-45 minutes

## 2021-11-19 ENCOUNTER — Other Ambulatory Visit: Payer: Self-pay | Admitting: *Deleted

## 2021-11-19 DIAGNOSIS — C786 Secondary malignant neoplasm of retroperitoneum and peritoneum: Secondary | ICD-10-CM

## 2021-11-20 ENCOUNTER — Other Ambulatory Visit (HOSPITAL_COMMUNITY): Payer: Medicare HMO

## 2021-11-20 ENCOUNTER — Ambulatory Visit (HOSPITAL_COMMUNITY): Payer: Medicare HMO

## 2021-11-20 DIAGNOSIS — Z932 Ileostomy status: Secondary | ICD-10-CM | POA: Diagnosis not present

## 2021-11-23 ENCOUNTER — Inpatient Hospital Stay: Payer: Medicare HMO | Admitting: Oncology

## 2021-11-23 ENCOUNTER — Inpatient Hospital Stay: Payer: Medicare HMO

## 2021-11-23 ENCOUNTER — Inpatient Hospital Stay: Payer: Medicare HMO | Attending: Oncology

## 2021-11-23 VITALS — BP 148/79 | HR 100 | Temp 98.2°F | Resp 20 | Ht 59.0 in | Wt 125.6 lb

## 2021-11-23 DIAGNOSIS — Z5111 Encounter for antineoplastic chemotherapy: Secondary | ICD-10-CM | POA: Diagnosis not present

## 2021-11-23 DIAGNOSIS — Z95828 Presence of other vascular implants and grafts: Secondary | ICD-10-CM

## 2021-11-23 DIAGNOSIS — C786 Secondary malignant neoplasm of retroperitoneum and peritoneum: Secondary | ICD-10-CM | POA: Insufficient documentation

## 2021-11-23 DIAGNOSIS — C801 Malignant (primary) neoplasm, unspecified: Secondary | ICD-10-CM | POA: Insufficient documentation

## 2021-11-23 DIAGNOSIS — C481 Malignant neoplasm of specified parts of peritoneum: Secondary | ICD-10-CM

## 2021-11-23 DIAGNOSIS — Z5189 Encounter for other specified aftercare: Secondary | ICD-10-CM | POA: Insufficient documentation

## 2021-11-23 LAB — CMP (CANCER CENTER ONLY)
ALT: 13 U/L (ref 0–44)
AST: 25 U/L (ref 15–41)
Albumin: 3.6 g/dL (ref 3.5–5.0)
Alkaline Phosphatase: 94 U/L (ref 38–126)
Anion gap: 10 (ref 5–15)
BUN: 8 mg/dL (ref 8–23)
CO2: 27 mmol/L (ref 22–32)
Calcium: 9.1 mg/dL (ref 8.9–10.3)
Chloride: 101 mmol/L (ref 98–111)
Creatinine: 0.6 mg/dL (ref 0.44–1.00)
GFR, Estimated: >60 mL/min (ref >60)
Glucose, Bld: 122 mg/dL — ABNORMAL HIGH (ref 70–99)
Potassium: 3.9 mmol/L (ref 3.5–5.1)
Sodium: 138 mmol/L (ref 135–145)
Total Bilirubin: 0.3 mg/dL (ref 0.3–1.2)
Total Protein: 6 g/dL — ABNORMAL LOW (ref 6.5–8.1)

## 2021-11-23 LAB — CBC WITH DIFFERENTIAL (CANCER CENTER ONLY)
Abs Immature Granulocytes: 1.24 K/uL — ABNORMAL HIGH (ref 0.00–0.07)
Basophils Absolute: 0.1 K/uL (ref 0.0–0.1)
Basophils Relative: 1 %
Eosinophils Absolute: 0.2 K/uL (ref 0.0–0.5)
Eosinophils Relative: 1 %
HCT: 29.9 % — ABNORMAL LOW (ref 36.0–46.0)
Hemoglobin: 9.9 g/dL — ABNORMAL LOW (ref 12.0–15.0)
Immature Granulocytes: 7 %
Lymphocytes Relative: 8 %
Lymphs Abs: 1.4 K/uL (ref 0.7–4.0)
MCH: 33 pg (ref 26.0–34.0)
MCHC: 33.1 g/dL (ref 30.0–36.0)
MCV: 99.7 fL (ref 80.0–100.0)
Monocytes Absolute: 1.2 K/uL — ABNORMAL HIGH (ref 0.1–1.0)
Monocytes Relative: 7 %
Neutro Abs: 12.8 K/uL — ABNORMAL HIGH (ref 1.7–7.7)
Neutrophils Relative %: 76 %
Platelet Count: 265 K/uL (ref 150–400)
RBC: 3 MIL/uL — ABNORMAL LOW (ref 3.87–5.11)
RDW: 13.1 % (ref 11.5–15.5)
WBC Count: 17 K/uL — ABNORMAL HIGH (ref 4.0–10.5)
nRBC: 0 % (ref 0.0–0.2)

## 2021-11-23 LAB — MAGNESIUM: Magnesium: 1.5 mg/dL — ABNORMAL LOW (ref 1.7–2.4)

## 2021-11-23 MED ORDER — SODIUM CHLORIDE 0.9% FLUSH
10.0000 mL | INTRAVENOUS | Status: DC | PRN
  Administered 2021-11-23: 10 mL via INTRAVENOUS

## 2021-11-23 MED ORDER — HEPARIN SOD (PORK) LOCK FLUSH 100 UNIT/ML IV SOLN
500.0000 [IU] | Freq: Once | INTRAVENOUS | Status: AC
  Administered 2021-11-23: 500 [IU] via INTRAVENOUS

## 2021-11-23 MED ORDER — MAGNESIUM OXIDE -MG SUPPLEMENT 400 (240 MG) MG PO TABS: 400.0000 mg | ORAL_TABLET | Freq: Two times a day (BID) | ORAL | 3 refills | Status: DC

## 2021-11-23 NOTE — Progress Notes (Signed)
[facility] OFFICE PROGRESS NOTE   Diagnosis: Peritoneal carcinoma  INTERVAL HISTORY:   Nancy Harrison returns as scheduled.  She received intravenous fluids and Decadron on 11/18/2021.  No further nausea.  She has not use the venting gastrostomy.  She empties the ileostomy approximately 3 times daily.  She is walking in the house.  No new complaint.  Objective:  Vital signs in last 24 hours:  Blood pressure (!) 148/79, pulse 100, temperature 98.2 F (36.8 C), temperature source Oral, resp. rate 20, height '4\' 11"'  (1.499 m), weight 125 lb 9.6 oz (57 kg), SpO2 99 %.    HEENT: Mucous membranes are moist, no thrush or ulcers Resp: Lungs clear bilaterally Cardio: Regular rate and rhythm GI: No hepatosplenomegaly, left upper quadrant gastrostomy tube, right abdomen ileostomy with a small amount of liquid stool.  The abdomen is mildly distended Vascular: Trace lower leg/ankle edema bilaterally  Portacath/PICC-without erythema  Lab Results:  Lab Results  Component Value Date   WBC 17.0 (H) 11/23/2021   HGB 9.9 (L) 11/23/2021   HCT 29.9 (L) 11/23/2021   MCV 99.7 11/23/2021   PLT 265 11/23/2021   NEUTROABS PENDING 11/23/2021    CMP  Lab Results  Component Value Date   NA 138 11/23/2021   K 3.9 11/23/2021   CL 101 11/23/2021   CO2 27 11/23/2021   GLUCOSE 122 (H) 11/23/2021   BUN 8 11/23/2021   CREATININE 0.60 11/23/2021   CALCIUM 9.1 11/23/2021   PROT 6.0 (L) 11/23/2021   ALBUMIN 3.6 11/23/2021   AST 25 11/23/2021   ALT 13 11/23/2021   ALKPHOS 94 11/23/2021   BILITOT 0.3 11/23/2021   GFRNONAA >60 11/23/2021    Lab Results  Component Value Date   CEA1 2.5 10/18/2021     Medications: I have reviewed the patient's current medications.   Assessment/Plan: 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  Bone lesions concerning for metastases, right lung nodule Laparoscopy, diverting loop ileostomy, gastrostomy tube placement, peritoneal biopsy 10/16/2021, no evidence of a primary tumor site at the appendix, ovaries, or uterus.  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  CDX2, CK20, and GATA3 negative.  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   Abdominal distention/pain and diarrhea secondary to #1 3.   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  Lumpectomy and adjuvant CMF x8 followed by radiation 4. Hypertension 5. Hypothyroidism 6. Barrett's esophagus 7. Family history of multiple cancers including appendix, colon, and lung cancer 8.  Hospital admission 11/05/2021 with dehydration/prerenal azotemia secondary to nausea and high output ileostomy      Disposition: Nancy Harrison is now at day 12 following cycle 1 Taxol/carboplatin.  Her performance status appears improved today.  The CBC is adequate.  The magnesium is mildly decreased.  She declines intravenous fluids and magnesium supplementation today.  She will begin an oral magnesium supplement.  Nancy Harrison will return  for an office visit and cycle 2 Taxol/carboplatin next week.  The venting gastrostomy tube will remain in place for now.  [name], MD  11/23/2021  11:32 AM

## 2021-11-23 NOTE — Patient Instructions (Signed)

## 2021-11-24 DIAGNOSIS — K2270 Barrett's esophagus without dysplasia: Secondary | ICD-10-CM | POA: Diagnosis not present

## 2021-11-24 DIAGNOSIS — C786 Secondary malignant neoplasm of retroperitoneum and peritoneum: Secondary | ICD-10-CM | POA: Diagnosis not present

## 2021-11-24 DIAGNOSIS — D539 Nutritional anemia, unspecified: Secondary | ICD-10-CM | POA: Diagnosis not present

## 2021-11-24 DIAGNOSIS — H538 Other visual disturbances: Secondary | ICD-10-CM | POA: Diagnosis not present

## 2021-11-24 DIAGNOSIS — R69 Illness, unspecified: Secondary | ICD-10-CM | POA: Diagnosis not present

## 2021-11-24 DIAGNOSIS — Z9181 History of falling: Secondary | ICD-10-CM | POA: Diagnosis not present

## 2021-11-24 DIAGNOSIS — E46 Unspecified protein-calorie malnutrition: Secondary | ICD-10-CM | POA: Diagnosis not present

## 2021-11-24 DIAGNOSIS — I10 Essential (primary) hypertension: Secondary | ICD-10-CM | POA: Diagnosis not present

## 2021-11-24 DIAGNOSIS — E0500 Thyrotoxicosis with diffuse goiter without thyrotoxic crisis or storm: Secondary | ICD-10-CM | POA: Diagnosis not present

## 2021-11-24 DIAGNOSIS — Z87891 Personal history of nicotine dependence: Secondary | ICD-10-CM | POA: Diagnosis not present

## 2021-11-24 DIAGNOSIS — Z483 Aftercare following surgery for neoplasm: Secondary | ICD-10-CM | POA: Diagnosis not present

## 2021-11-24 DIAGNOSIS — Z853 Personal history of malignant neoplasm of breast: Secondary | ICD-10-CM | POA: Diagnosis not present

## 2021-11-24 DIAGNOSIS — M1990 Unspecified osteoarthritis, unspecified site: Secondary | ICD-10-CM | POA: Diagnosis not present

## 2021-11-24 DIAGNOSIS — Z431 Encounter for attention to gastrostomy: Secondary | ICD-10-CM | POA: Diagnosis not present

## 2021-11-24 DIAGNOSIS — E785 Hyperlipidemia, unspecified: Secondary | ICD-10-CM | POA: Diagnosis not present

## 2021-11-24 DIAGNOSIS — Z96653 Presence of artificial knee joint, bilateral: Secondary | ICD-10-CM | POA: Diagnosis not present

## 2021-11-24 DIAGNOSIS — C184 Malignant neoplasm of transverse colon: Secondary | ICD-10-CM | POA: Diagnosis not present

## 2021-11-24 DIAGNOSIS — K219 Gastro-esophageal reflux disease without esophagitis: Secondary | ICD-10-CM | POA: Diagnosis not present

## 2021-11-24 DIAGNOSIS — H269 Unspecified cataract: Secondary | ICD-10-CM | POA: Diagnosis not present

## 2021-11-24 DIAGNOSIS — Z432 Encounter for attention to ileostomy: Secondary | ICD-10-CM | POA: Diagnosis not present

## 2021-11-26 DIAGNOSIS — D539 Nutritional anemia, unspecified: Secondary | ICD-10-CM | POA: Diagnosis not present

## 2021-11-26 DIAGNOSIS — I10 Essential (primary) hypertension: Secondary | ICD-10-CM | POA: Diagnosis not present

## 2021-11-26 DIAGNOSIS — K219 Gastro-esophageal reflux disease without esophagitis: Secondary | ICD-10-CM | POA: Diagnosis not present

## 2021-11-26 DIAGNOSIS — C184 Malignant neoplasm of transverse colon: Secondary | ICD-10-CM | POA: Diagnosis not present

## 2021-11-26 DIAGNOSIS — E0500 Thyrotoxicosis with diffuse goiter without thyrotoxic crisis or storm: Secondary | ICD-10-CM | POA: Diagnosis not present

## 2021-11-26 DIAGNOSIS — Z9181 History of falling: Secondary | ICD-10-CM | POA: Diagnosis not present

## 2021-11-26 DIAGNOSIS — C786 Secondary malignant neoplasm of retroperitoneum and peritoneum: Secondary | ICD-10-CM | POA: Diagnosis not present

## 2021-11-26 DIAGNOSIS — H269 Unspecified cataract: Secondary | ICD-10-CM | POA: Diagnosis not present

## 2021-11-26 DIAGNOSIS — M1990 Unspecified osteoarthritis, unspecified site: Secondary | ICD-10-CM | POA: Diagnosis not present

## 2021-11-26 DIAGNOSIS — Z483 Aftercare following surgery for neoplasm: Secondary | ICD-10-CM | POA: Diagnosis not present

## 2021-11-26 DIAGNOSIS — H538 Other visual disturbances: Secondary | ICD-10-CM | POA: Diagnosis not present

## 2021-11-26 DIAGNOSIS — Z96653 Presence of artificial knee joint, bilateral: Secondary | ICD-10-CM | POA: Diagnosis not present

## 2021-11-26 DIAGNOSIS — Z432 Encounter for attention to ileostomy: Secondary | ICD-10-CM | POA: Diagnosis not present

## 2021-11-26 DIAGNOSIS — Z87891 Personal history of nicotine dependence: Secondary | ICD-10-CM | POA: Diagnosis not present

## 2021-11-26 DIAGNOSIS — Z853 Personal history of malignant neoplasm of breast: Secondary | ICD-10-CM | POA: Diagnosis not present

## 2021-11-26 DIAGNOSIS — E46 Unspecified protein-calorie malnutrition: Secondary | ICD-10-CM | POA: Diagnosis not present

## 2021-11-26 DIAGNOSIS — E785 Hyperlipidemia, unspecified: Secondary | ICD-10-CM | POA: Diagnosis not present

## 2021-11-26 DIAGNOSIS — R69 Illness, unspecified: Secondary | ICD-10-CM | POA: Diagnosis not present

## 2021-11-26 DIAGNOSIS — K2270 Barrett's esophagus without dysplasia: Secondary | ICD-10-CM | POA: Diagnosis not present

## 2021-11-26 DIAGNOSIS — Z431 Encounter for attention to gastrostomy: Secondary | ICD-10-CM | POA: Diagnosis not present

## 2021-11-29 ENCOUNTER — Other Ambulatory Visit: Payer: Self-pay | Admitting: Oncology

## 2021-11-30 DIAGNOSIS — C184 Malignant neoplasm of transverse colon: Secondary | ICD-10-CM | POA: Diagnosis not present

## 2021-11-30 DIAGNOSIS — M1990 Unspecified osteoarthritis, unspecified site: Secondary | ICD-10-CM | POA: Diagnosis not present

## 2021-11-30 DIAGNOSIS — K2270 Barrett's esophagus without dysplasia: Secondary | ICD-10-CM | POA: Diagnosis not present

## 2021-11-30 DIAGNOSIS — Z483 Aftercare following surgery for neoplasm: Secondary | ICD-10-CM | POA: Diagnosis not present

## 2021-11-30 DIAGNOSIS — E46 Unspecified protein-calorie malnutrition: Secondary | ICD-10-CM | POA: Diagnosis not present

## 2021-11-30 DIAGNOSIS — Z9181 History of falling: Secondary | ICD-10-CM | POA: Diagnosis not present

## 2021-11-30 DIAGNOSIS — R69 Illness, unspecified: Secondary | ICD-10-CM | POA: Diagnosis not present

## 2021-11-30 DIAGNOSIS — H269 Unspecified cataract: Secondary | ICD-10-CM | POA: Diagnosis not present

## 2021-11-30 DIAGNOSIS — Z87891 Personal history of nicotine dependence: Secondary | ICD-10-CM | POA: Diagnosis not present

## 2021-11-30 DIAGNOSIS — H538 Other visual disturbances: Secondary | ICD-10-CM | POA: Diagnosis not present

## 2021-11-30 DIAGNOSIS — E785 Hyperlipidemia, unspecified: Secondary | ICD-10-CM | POA: Diagnosis not present

## 2021-11-30 DIAGNOSIS — C786 Secondary malignant neoplasm of retroperitoneum and peritoneum: Secondary | ICD-10-CM | POA: Diagnosis not present

## 2021-11-30 DIAGNOSIS — K219 Gastro-esophageal reflux disease without esophagitis: Secondary | ICD-10-CM | POA: Diagnosis not present

## 2021-11-30 DIAGNOSIS — E0500 Thyrotoxicosis with diffuse goiter without thyrotoxic crisis or storm: Secondary | ICD-10-CM | POA: Diagnosis not present

## 2021-11-30 DIAGNOSIS — Z432 Encounter for attention to ileostomy: Secondary | ICD-10-CM | POA: Diagnosis not present

## 2021-11-30 DIAGNOSIS — I10 Essential (primary) hypertension: Secondary | ICD-10-CM | POA: Diagnosis not present

## 2021-11-30 DIAGNOSIS — D539 Nutritional anemia, unspecified: Secondary | ICD-10-CM | POA: Diagnosis not present

## 2021-11-30 DIAGNOSIS — Z96653 Presence of artificial knee joint, bilateral: Secondary | ICD-10-CM | POA: Diagnosis not present

## 2021-11-30 DIAGNOSIS — Z853 Personal history of malignant neoplasm of breast: Secondary | ICD-10-CM | POA: Diagnosis not present

## 2021-11-30 DIAGNOSIS — Z431 Encounter for attention to gastrostomy: Secondary | ICD-10-CM | POA: Diagnosis not present

## 2021-12-02 ENCOUNTER — Inpatient Hospital Stay: Payer: Medicare HMO

## 2021-12-02 ENCOUNTER — Encounter: Payer: Self-pay | Admitting: Nurse Practitioner

## 2021-12-02 ENCOUNTER — Inpatient Hospital Stay: Payer: Medicare HMO | Admitting: Nutrition

## 2021-12-02 ENCOUNTER — Inpatient Hospital Stay: Payer: Medicare HMO | Admitting: Nurse Practitioner

## 2021-12-02 VITALS — BP 134/81 | HR 100 | Temp 98.1°F | Resp 18 | Ht 59.0 in | Wt 127.0 lb

## 2021-12-02 DIAGNOSIS — C786 Secondary malignant neoplasm of retroperitoneum and peritoneum: Secondary | ICD-10-CM

## 2021-12-02 DIAGNOSIS — C481 Malignant neoplasm of specified parts of peritoneum: Secondary | ICD-10-CM

## 2021-12-02 DIAGNOSIS — C801 Malignant (primary) neoplasm, unspecified: Secondary | ICD-10-CM | POA: Diagnosis not present

## 2021-12-02 DIAGNOSIS — Z5189 Encounter for other specified aftercare: Secondary | ICD-10-CM | POA: Diagnosis not present

## 2021-12-02 DIAGNOSIS — Z5111 Encounter for antineoplastic chemotherapy: Secondary | ICD-10-CM | POA: Diagnosis not present

## 2021-12-02 DIAGNOSIS — Z95828 Presence of other vascular implants and grafts: Secondary | ICD-10-CM

## 2021-12-02 LAB — CMP (CANCER CENTER ONLY)
ALT: 13 U/L (ref 0–44)
AST: 20 U/L (ref 15–41)
Albumin: 3.7 g/dL (ref 3.5–5.0)
Alkaline Phosphatase: 58 U/L (ref 38–126)
Anion gap: 8 (ref 5–15)
BUN: 16 mg/dL (ref 8–23)
CO2: 28 mmol/L (ref 22–32)
Calcium: 9.6 mg/dL (ref 8.9–10.3)
Chloride: 100 mmol/L (ref 98–111)
Creatinine: 0.58 mg/dL (ref 0.44–1.00)
GFR, Estimated: >60 mL/min (ref >60)
Glucose, Bld: 119 mg/dL — ABNORMAL HIGH (ref 70–99)
Potassium: 3.7 mmol/L (ref 3.5–5.1)
Sodium: 136 mmol/L (ref 135–145)
Total Bilirubin: 0.3 mg/dL (ref 0.3–1.2)
Total Protein: 6.8 g/dL (ref 6.5–8.1)

## 2021-12-02 LAB — CBC WITH DIFFERENTIAL (CANCER CENTER ONLY)
Abs Immature Granulocytes: 0.06 10*3/uL (ref 0.00–0.07)
Basophils Absolute: 0.1 10*3/uL (ref 0.0–0.1)
Basophils Relative: 1 %
Eosinophils Absolute: 0.2 10*3/uL (ref 0.0–0.5)
Eosinophils Relative: 2 %
HCT: 29.7 % — ABNORMAL LOW (ref 36.0–46.0)
Hemoglobin: 9.7 g/dL — ABNORMAL LOW (ref 12.0–15.0)
Immature Granulocytes: 1 %
Lymphocytes Relative: 16 %
Lymphs Abs: 1.3 10*3/uL (ref 0.7–4.0)
MCH: 33.3 pg (ref 26.0–34.0)
MCHC: 32.7 g/dL (ref 30.0–36.0)
MCV: 102.1 fL — ABNORMAL HIGH (ref 80.0–100.0)
Monocytes Absolute: 1 10*3/uL (ref 0.1–1.0)
Monocytes Relative: 11 %
Neutro Abs: 5.8 10*3/uL (ref 1.7–7.7)
Neutrophils Relative %: 69 %
Platelet Count: 399 10*3/uL (ref 150–400)
RBC: 2.91 MIL/uL — ABNORMAL LOW (ref 3.87–5.11)
RDW: 14.1 % (ref 11.5–15.5)
WBC Count: 8.3 10*3/uL (ref 4.0–10.5)
nRBC: 0 % (ref 0.0–0.2)

## 2021-12-02 LAB — MAGNESIUM: Magnesium: 1.7 mg/dL (ref 1.7–2.4)

## 2021-12-02 MED ORDER — SODIUM CHLORIDE 0.9% FLUSH
10.0000 mL | Freq: Once | INTRAVENOUS | Status: AC
  Administered 2021-12-02: 10 mL via INTRAVENOUS

## 2021-12-02 MED ORDER — HEPARIN SOD (PORK) LOCK FLUSH 100 UNIT/ML IV SOLN
500.0000 [IU] | Freq: Once | INTRAVENOUS | Status: AC
  Administered 2021-12-02: 500 [IU] via INTRAVENOUS

## 2021-12-02 NOTE — Progress Notes (Signed)
  [facility] OFFICE PROGRESS NOTE   Diagnosis: Peritoneal carcinoma  INTERVAL HISTORY:   Nancy Harrison returns as scheduled.  She completed cycle 1 Taxol/carboplatin 11/12/2021.  She denies significant nausea/vomiting.  No mouth sores.  No significant diarrhea.  She is taking Imodium 3-4 times a day.  She empties the ileostomy 4-5 times a day.  Consistency is rarely watery.  She reports waking up with a headache on several occasions.  No diplopia.  She notes bilateral leg swelling.  No achy bone pain with white cell growth factor support.  She noted right hip pain this morning.  No known injury.  Objective:  Vital signs in last 24 hours:  Blood pressure 134/81, pulse 100, temperature 98.1 F (36.7 C), temperature source Oral, resp. rate 18, height $RemoveBe'4\' 11"'ddnQgSdIm$  (1.499 m), weight 127 lb (57.6 kg), SpO2 98 %.    HEENT: No thrush or ulcers. Resp: Lungs clear bilaterally. Cardio: Regular rate and rhythm. GI: No hepatosplenomegaly.  Left upper quadrant gastrostomy tube.  Right abdomen ileostomy. Vascular: Trace pitting edema lower leg/ankle bilaterally. Port-A-Cath without erythema.  Lab Results:  Lab Results  Component Value Date   WBC 8.3 12/02/2021   HGB 9.7 (L) 12/02/2021   HCT 29.7 (L) 12/02/2021   MCV 102.1 (H) 12/02/2021   PLT 399 12/02/2021   NEUTROABS 5.8 12/02/2021    Imaging:  No results found.  Medications: I have reviewed the patient's current medications.  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  Bone lesions concerning for metastases, right lung nodule Laparoscopy, diverting loop ileostomy, gastrostomy tube placement, peritoneal biopsy 10/16/2021, no evidence of  a primary tumor site at the appendix, ovaries, or uterus.  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  CDX2, CK20, and GATA3 negative.  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   Abdominal distention/pain and diarrhea secondary to #1 3.   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  Lumpectomy and adjuvant CMF x8 followed by radiation 4. Hypertension 5. Hypothyroidism 6. Barrett's esophagus 7. Family history of multiple cancers including appendix, colon, and lung cancer 8.  Hospital admission 11/05/2021 with dehydration/prerenal azotemia secondary to nausea and high output ileostomy  Disposition: [name] appears stable.  Performance status is better.  She has completed 1 cycle of Taxol/carboplatin.  Overall she seems to have tolerated well.  Plan to proceed with cycle 2 as scheduled 12/03/2021.  She receives white cell growth factor support day 2.  CBC and chemistry panel reviewed.  Labs adequate to proceed as above.  She will return for follow-up prior to cycle 3 Taxol/carboplatin in 3 weeks.  We are available to see her sooner if needed.    Ned Card ANP/GNP-BC   12/02/2021  2:06 PM

## 2021-12-02 NOTE — Progress Notes (Signed)
Nutrition follow up completed with patient.  Weight is stable and documented as 127 pounds on Oct 11.  Labs noted: Glucose 119, Magnesium 1.7  Patient reports good appetite and good oral intake. She eats 3 meals and snacks throughout the day. She has not used the G tube for venting. Denies Nausea and Vomiting. 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 She takes Imodium. Reports stool consistency is pudding like. Reports drinking 32 oz water daily and a few other fluids such as Juice. Drinks one Boost plus daily.  Nutrition Diagnosis: Unintentional wt loss stable.  Intervention: Educated to continue small frequent meals and snacks. Begin introducing higher fiber foods as tolerated. Increase fluids to goal of 64 oz daily. Reviewed strategies to increase fluids. Continue Boost Plus once daily. Add high protein foods.  Monitoring, Evaluation, Goals: Patient will tolerated oral diet for wt maintenance.  Next Visit: Schedule as needed.

## 2021-12-02 NOTE — Patient Instructions (Signed)

## 2021-12-03 ENCOUNTER — Inpatient Hospital Stay: Payer: Medicare HMO

## 2021-12-03 ENCOUNTER — Telehealth: Payer: Self-pay | Admitting: Genetic Counselor

## 2021-12-03 ENCOUNTER — Other Ambulatory Visit: Payer: Self-pay

## 2021-12-03 ENCOUNTER — Encounter: Payer: Self-pay | Admitting: Genetic Counselor

## 2021-12-03 VITALS — BP 137/82 | HR 100 | Temp 98.3°F | Resp 18

## 2021-12-03 DIAGNOSIS — C481 Malignant neoplasm of specified parts of peritoneum: Secondary | ICD-10-CM

## 2021-12-03 DIAGNOSIS — Z5189 Encounter for other specified aftercare: Secondary | ICD-10-CM | POA: Diagnosis not present

## 2021-12-03 DIAGNOSIS — Z5111 Encounter for antineoplastic chemotherapy: Secondary | ICD-10-CM | POA: Diagnosis not present

## 2021-12-03 DIAGNOSIS — C786 Secondary malignant neoplasm of retroperitoneum and peritoneum: Secondary | ICD-10-CM | POA: Diagnosis not present

## 2021-12-03 DIAGNOSIS — C801 Malignant (primary) neoplasm, unspecified: Secondary | ICD-10-CM | POA: Diagnosis not present

## 2021-12-03 DIAGNOSIS — Z1379 Encounter for other screening for genetic and chromosomal anomalies: Secondary | ICD-10-CM | POA: Insufficient documentation

## 2021-12-03 MED ORDER — SODIUM CHLORIDE 0.9 % IV SOLN
175.0000 mg/m2 | Freq: Once | INTRAVENOUS | Status: AC
  Administered 2021-12-03: 258 mg via INTRAVENOUS
  Filled 2021-12-03: qty 43

## 2021-12-03 MED ORDER — SODIUM CHLORIDE 0.9% FLUSH
10.0000 mL | INTRAVENOUS | Status: DC | PRN
  Administered 2021-12-03: 10 mL

## 2021-12-03 MED ORDER — FAMOTIDINE IN NACL 20-0.9 MG/50ML-% IV SOLN
20.0000 mg | Freq: Once | INTRAVENOUS | Status: AC
  Administered 2021-12-03: 20 mg via INTRAVENOUS
  Filled 2021-12-03: qty 50

## 2021-12-03 MED ORDER — SODIUM CHLORIDE 0.9 % IV SOLN
10.0000 mg | Freq: Once | INTRAVENOUS | Status: AC
  Administered 2021-12-03: 10 mg via INTRAVENOUS
  Filled 2021-12-03: qty 10

## 2021-12-03 MED ORDER — HEPARIN SOD (PORK) LOCK FLUSH 100 UNIT/ML IV SOLN
500.0000 [IU] | Freq: Once | INTRAVENOUS | Status: AC | PRN
  Administered 2021-12-03: 500 [IU]

## 2021-12-03 MED ORDER — PALONOSETRON HCL INJECTION 0.25 MG/5ML
0.2500 mg | Freq: Once | INTRAVENOUS | Status: AC
  Administered 2021-12-03: 0.25 mg via INTRAVENOUS
  Filled 2021-12-03: qty 5

## 2021-12-03 MED ORDER — SODIUM CHLORIDE 0.9 % IV SOLN: Freq: Once | INTRAVENOUS | Status: AC

## 2021-12-03 MED ORDER — DIPHENHYDRAMINE HCL 50 MG/ML IJ SOLN
25.0000 mg | Freq: Once | INTRAMUSCULAR | Status: AC
  Administered 2021-12-03: 25 mg via INTRAVENOUS
  Filled 2021-12-03: qty 1

## 2021-12-03 MED ORDER — SODIUM CHLORIDE 0.9 % IV SOLN
318.0000 mg | Freq: Once | INTRAVENOUS | Status: AC
  Administered 2021-12-03: 320 mg via INTRAVENOUS
  Filled 2021-12-03: qty 32

## 2021-12-03 MED ORDER — SODIUM CHLORIDE 0.9 % IV SOLN
150.0000 mg | Freq: Once | INTRAVENOUS | Status: AC
  Administered 2021-12-03: 150 mg via INTRAVENOUS
  Filled 2021-12-03: qty 150

## 2021-12-03 NOTE — Patient Instructions (Signed)
[facility]   Discharge Instructions: Thank you for choosing [facility] to provide your oncology and hematology care.   If you have a lab appointment with the [facility], please go directly to the [facility] and check in at the registration area.   Wear comfortable clothing and clothing appropriate for easy access to any Portacath or PICC line.   We strive to give you quality time with your provider. You may need to reschedule your appointment if you arrive late (15 or more minutes).  Arriving late affects you and other patients whose appointments are after yours.  Also, if you miss three or more appointments without notifying the office, you may be dismissed from the clinic at the provider's discretion.      For prescription refill requests, have your pharmacy contact our office and allow 72 hours for refills to be completed.    Today you received the following chemotherapy and/or immunotherapy agents Paclitaxel (TAXOL) & Carboplatin (PARAPLATIN).      To help prevent nausea and vomiting after your treatment, we encourage you to take your nausea medication as directed.  BELOW ARE SYMPTOMS THAT SHOULD BE REPORTED IMMEDIATELY: *FEVER GREATER THAN 100.4 F (38 C) OR HIGHER *CHILLS OR SWEATING *NAUSEA AND VOMITING THAT IS NOT CONTROLLED WITH YOUR NAUSEA MEDICATION *UNUSUAL SHORTNESS OF BREATH *UNUSUAL BRUISING OR BLEEDING *URINARY PROBLEMS (pain or burning when urinating, or frequent urination) *BOWEL PROBLEMS (unusual diarrhea, constipation, pain near the anus) TENDERNESS IN MOUTH AND THROAT WITH OR WITHOUT PRESENCE OF ULCERS (sore throat, sores in mouth, or a toothache) UNUSUAL RASH, SWELLING OR PAIN  UNUSUAL VAGINAL DISCHARGE OR ITCHING   Items with * indicate a potential emergency and should be followed up as soon as possible or go to the Emergency Department if any problems should occur.  Please show the CHEMOTHERAPY ALERT CARD or  IMMUNOTHERAPY ALERT CARD at check-in to the Emergency Department and triage nurse.  Should you have questions after your visit or need to cancel or reschedule your appointment, please contact [facility]  Dept: [phone]  and follow the prompts.  Office hours are 8:00 a.m. to 4:30 p.m. Monday - Friday. Please note that voicemails left after 4:00 p.m. may not be returned until the following business day.  We are closed weekends and major holidays. You have access to a nurse at all times for urgent questions. Please call the main number to the clinic Dept: [phone] and follow the prompts.   For any non-urgent questions, you may also contact your provider using MyChart. We now offer e-Visits for anyone 84 and older to request care online for non-urgent symptoms. For details visit [email or web address].   Also download the MyChart app! Go to the app store, search "MyChart", open the app, select [facility], and log in with your MyChart username and password.  Masks are optional in the cancer centers. If you would like for your care team to wear a mask while they are taking care of you, please let them know. You may have one support person who is at least 79 years old accompany you for your appointments.  Paclitaxel Injection What is this medication? PACLITAXEL (PAK li TAX el) treats some types of cancer. It works by slowing down the growth of cancer cells. This medicine may be used for other purposes; ask your health care provider or pharmacist if you have questions. COMMON BRAND NAME(S): Onxol, Taxol What should I tell my care  team before I take this medication? They need to know if you have any of these conditions: Heart disease Liver disease Low white blood cell levels An unusual or allergic reaction to paclitaxel, other medications, foods, dyes, or preservatives If you or your partner are pregnant or trying to get pregnant Breast-feeding How should I use  this medication? This medication is injected into a vein. It is given by your care team in a hospital or clinic setting. Talk to your care team about the use of this medication in children. While it may be given to children for selected conditions, precautions do apply. Overdosage: If you think you have taken too much of this medicine contact a poison control center or emergency room at once. NOTE: This medicine is only for you. Do not share this medicine with others. What if I miss a dose? Keep appointments for follow-up doses. It is important not to miss your dose. Call your care team if you are unable to keep an appointment. What may interact with this medication? Do not take this medication with any of the following: Live virus vaccines Other medications may affect the way this medication works. Talk with your care team about all of the medications you take. They may suggest changes to your treatment plan to lower the risk of side effects and to make sure your medications work as intended. This list may not describe all possible interactions. Give your health care provider a list of all the medicines, herbs, non-prescription drugs, or dietary supplements you use. Also tell them if you smoke, drink alcohol, or use illegal drugs. Some items may interact with your medicine. What should I watch for while using this medication? Your condition will be monitored carefully while you are receiving this medication. You may need blood work while taking this medication. This medication may make you feel generally unwell. This is not uncommon as chemotherapy can affect healthy cells as well as cancer cells. Report any side effects. Continue your course of treatment even though you feel ill unless your care team tells you to stop. This medication can cause serious allergic reactions. To reduce the risk, your care team may give you other medications to take before receiving this one. Be sure to follow the  directions from your care team. This medication may increase your risk of getting an infection. Call your care team for advice if you get a fever, chills, sore throat, or other symptoms of a cold or flu. Do not treat yourself. Try to avoid being around people who are sick. This medication may increase your risk to bruise or bleed. Call your care team if you notice any unusual bleeding. Be careful brushing or flossing your teeth or using a toothpick because you may get an infection or bleed more easily. If you have any dental work done, tell your dentist you are receiving this medication. Talk to your care team if you may be pregnant. Serious birth defects can occur if you take this medication during pregnancy. Talk to your care team before breastfeeding. Changes to your treatment plan may be needed. What side effects may I notice from receiving this medication? Side effects that you should report to your care team as soon as possible: Allergic reactions--skin rash, itching, hives, swelling of the face, lips, tongue, or throat Heart rhythm changes--fast or irregular heartbeat, dizziness, feeling faint or lightheaded, chest pain, trouble breathing Increase in blood pressure Infection--fever, chills, cough, sore throat, wounds that don't heal, pain or trouble when   passing urine, general feeling of discomfort or being unwell Low blood pressure--dizziness, feeling faint or lightheaded, blurry vision Low red blood cell level--unusual weakness or fatigue, dizziness, headache, trouble breathing Painful swelling, warmth, or redness of the skin, blisters or sores at the infusion site Pain, tingling, or numbness in the hands or feet Slow heartbeat--dizziness, feeling faint or lightheaded, confusion, trouble breathing, unusual weakness or fatigue Unusual bruising or bleeding Side effects that usually do not require medical attention (report to your care team if they continue or are bothersome): Diarrhea Hair  loss Joint pain Loss of appetite Muscle pain Nausea Vomiting This list may not describe all possible side effects. Call your doctor for medical advice about side effects. You may report side effects to FDA at 1-800-FDA-1088. Where should I keep my medication? This medication is given in a hospital or clinic. It will not be stored at home. NOTE: This sheet is a summary. It may not cover all possible information. If you have questions about this medicine, talk to your doctor, pharmacist, or health care provider.  2023 Elsevier/Gold Standard (2021-06-25 00:00:00)  Carboplatin Injection What is this medication? CARBOPLATIN (KAR boe pla tin) treats some types of cancer. It works by slowing down the growth of cancer cells. This medicine may be used for other purposes; ask your health care provider or pharmacist if you have questions. COMMON BRAND NAME(S): Paraplatin What should I tell my care team before I take this medication? They need to know if you have any of these conditions: Blood disorders Hearing problems Kidney disease Recent or ongoing radiation therapy An unusual or allergic reaction to carboplatin, cisplatin, other medications, foods, dyes, or preservatives Pregnant or trying to get pregnant Breast-feeding How should I use this medication? This medication is injected into a vein. It is given by your care team in a hospital or clinic setting. Talk to your care team about the use of this medication in children. Special care may be needed. Overdosage: If you think you have taken too much of this medicine contact a poison control center or emergency room at once. NOTE: This medicine is only for you. Do not share this medicine with others. What if I miss a dose? Keep appointments for follow-up doses. It is important not to miss your dose. Call your care team if you are unable to keep an appointment. What may interact with this medication? Medications for seizures Some  antibiotics, such as amikacin, gentamicin, neomycin, streptomycin, tobramycin Vaccines This list may not describe all possible interactions. Give your health care provider a list of all the medicines, herbs, non-prescription drugs, or dietary supplements you use. Also tell them if you smoke, drink alcohol, or use illegal drugs. Some items may interact with your medicine. What should I watch for while using this medication? Your condition will be monitored carefully while you are receiving this medication. You may need blood work while taking this medication. This medication may make you feel generally unwell. This is not uncommon, as chemotherapy can affect healthy cells as well as cancer cells. Report any side effects. Continue your course of treatment even though you feel ill unless your care team tells you to stop. In some cases, you may be given additional medications to help with side effects. Follow all directions for their use. This medication may increase your risk of getting an infection. Call your care team for advice if you get a fever, chills, sore throat, or other symptoms of a cold or flu. Do not treat   yourself. Try to avoid being around people who are sick. Avoid taking medications that contain aspirin, acetaminophen, ibuprofen, naproxen, or ketoprofen unless instructed by your care team. These medications may hide a fever. Be careful brushing or flossing your teeth or using a toothpick because you may get an infection or bleed more easily. If you have any dental work done, tell your dentist you are receiving this medication. Talk to your care team if you wish to become pregnant or think you might be pregnant. This medication can cause serious birth defects. Talk to your care team about effective forms of contraception. Do not breast-feed while taking this medication. What side effects may I notice from receiving this medication? Side effects that you should report to your care team as  soon as possible: Allergic reactions--skin rash, itching, hives, swelling of the face, lips, tongue, or throat Infection--fever, chills, cough, sore throat, wounds that don't heal, pain or trouble when passing urine, general feeling of discomfort or being unwell Low red blood cell level--unusual weakness or fatigue, dizziness, headache, trouble breathing Pain, tingling, or numbness in the hands or feet, muscle weakness, change in vision, confusion or trouble speaking, loss of balance or coordination, trouble walking, seizures Unusual bruising or bleeding Side effects that usually do not require medical attention (report to your care team if they continue or are bothersome): Hair loss Nausea Unusual weakness or fatigue Vomiting This list may not describe all possible side effects. Call your doctor for medical advice about side effects. You may report side effects to FDA at 1-800-FDA-1088. Where should I keep my medication? This medication is given in a hospital or clinic. It will not be stored at home. NOTE: This sheet is a summary. It may not cover all possible information. If you have questions about this medicine, talk to your doctor, pharmacist, or health care provider.  2023 Elsevier/Gold Standard (2021-06-02 00:00:00) 

## 2021-12-03 NOTE — Telephone Encounter (Signed)
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  But this result does NOT explain her personal and family history of cancer.

## 2021-12-04 ENCOUNTER — Other Ambulatory Visit: Payer: Self-pay

## 2021-12-04 ENCOUNTER — Inpatient Hospital Stay: Payer: Medicare HMO

## 2021-12-04 VITALS — BP 116/74 | HR 94 | Temp 98.2°F | Resp 18

## 2021-12-04 DIAGNOSIS — H538 Other visual disturbances: Secondary | ICD-10-CM | POA: Diagnosis not present

## 2021-12-04 DIAGNOSIS — Z5189 Encounter for other specified aftercare: Secondary | ICD-10-CM | POA: Diagnosis not present

## 2021-12-04 DIAGNOSIS — R69 Illness, unspecified: Secondary | ICD-10-CM | POA: Diagnosis not present

## 2021-12-04 DIAGNOSIS — E785 Hyperlipidemia, unspecified: Secondary | ICD-10-CM | POA: Diagnosis not present

## 2021-12-04 DIAGNOSIS — Z483 Aftercare following surgery for neoplasm: Secondary | ICD-10-CM | POA: Diagnosis not present

## 2021-12-04 DIAGNOSIS — C184 Malignant neoplasm of transverse colon: Secondary | ICD-10-CM | POA: Diagnosis not present

## 2021-12-04 DIAGNOSIS — C786 Secondary malignant neoplasm of retroperitoneum and peritoneum: Secondary | ICD-10-CM | POA: Diagnosis not present

## 2021-12-04 DIAGNOSIS — C481 Malignant neoplasm of specified parts of peritoneum: Secondary | ICD-10-CM

## 2021-12-04 DIAGNOSIS — E46 Unspecified protein-calorie malnutrition: Secondary | ICD-10-CM | POA: Diagnosis not present

## 2021-12-04 DIAGNOSIS — Z9181 History of falling: Secondary | ICD-10-CM | POA: Diagnosis not present

## 2021-12-04 DIAGNOSIS — I10 Essential (primary) hypertension: Secondary | ICD-10-CM | POA: Diagnosis not present

## 2021-12-04 DIAGNOSIS — D539 Nutritional anemia, unspecified: Secondary | ICD-10-CM | POA: Diagnosis not present

## 2021-12-04 DIAGNOSIS — H269 Unspecified cataract: Secondary | ICD-10-CM | POA: Diagnosis not present

## 2021-12-04 DIAGNOSIS — Z431 Encounter for attention to gastrostomy: Secondary | ICD-10-CM | POA: Diagnosis not present

## 2021-12-04 DIAGNOSIS — C801 Malignant (primary) neoplasm, unspecified: Secondary | ICD-10-CM | POA: Diagnosis not present

## 2021-12-04 DIAGNOSIS — K219 Gastro-esophageal reflux disease without esophagitis: Secondary | ICD-10-CM | POA: Diagnosis not present

## 2021-12-04 DIAGNOSIS — E0500 Thyrotoxicosis with diffuse goiter without thyrotoxic crisis or storm: Secondary | ICD-10-CM | POA: Diagnosis not present

## 2021-12-04 DIAGNOSIS — Z5111 Encounter for antineoplastic chemotherapy: Secondary | ICD-10-CM | POA: Diagnosis not present

## 2021-12-04 DIAGNOSIS — Z87891 Personal history of nicotine dependence: Secondary | ICD-10-CM | POA: Diagnosis not present

## 2021-12-04 DIAGNOSIS — Z96653 Presence of artificial knee joint, bilateral: Secondary | ICD-10-CM | POA: Diagnosis not present

## 2021-12-04 DIAGNOSIS — Z432 Encounter for attention to ileostomy: Secondary | ICD-10-CM | POA: Diagnosis not present

## 2021-12-04 DIAGNOSIS — Z853 Personal history of malignant neoplasm of breast: Secondary | ICD-10-CM | POA: Diagnosis not present

## 2021-12-04 DIAGNOSIS — K2270 Barrett's esophagus without dysplasia: Secondary | ICD-10-CM | POA: Diagnosis not present

## 2021-12-04 DIAGNOSIS — M1990 Unspecified osteoarthritis, unspecified site: Secondary | ICD-10-CM | POA: Diagnosis not present

## 2021-12-04 MED ORDER — PEGFILGRASTIM-JMDB 6 MG/0.6ML ~~LOC~~ SOSY
6.0000 mg | PREFILLED_SYRINGE | Freq: Once | SUBCUTANEOUS | Status: AC
  Administered 2021-12-04: 6 mg via SUBCUTANEOUS
  Filled 2021-12-04: qty 0.6

## 2021-12-04 NOTE — Patient Instructions (Signed)

## 2021-12-08 ENCOUNTER — Ambulatory Visit: Payer: Self-pay | Admitting: Genetic Counselor

## 2021-12-08 ENCOUNTER — Encounter: Payer: Self-pay | Admitting: Oncology

## 2021-12-08 DIAGNOSIS — Z1379 Encounter for other screening for genetic and chromosomal anomalies: Secondary | ICD-10-CM

## 2021-12-08 NOTE — Progress Notes (Signed)
HPI: Nancy Harrison was previously seen in the [facility] clinic due to a family of cancer and concerns regarding a hereditary predisposition to cancer. Please refer to our prior cancer genetics clinic note for more information regarding Nancy Harrison's medical, social and family histories, and our assessment and recommendations, at the time. Nancy Harrison's recent genetic test results were disclosed to her, as were recommendations warranted by these results. These results and recommendations are discussed in more detail below.   FAMILY HISTORY:  We obtained a detailed, 4-generation family history.  Significant diagnoses are listed below: Family History  Problem Relation Age of Onset   Colon cancer Mother 47   Lung cancer Sister    Cancer Maternal Aunt        NOS   Cancer Daughter        appendiceal   Stomach cancer Neg Hx    Rectal cancer Neg Hx    Esophageal cancer Neg Hx      The patient has three children, one of whom had appendiceal cancer at 68.  This daughter had genetic testing and was found to have two CFTR pathogenic mutations and one single pathogenic MUTYH mutation. She has a maternal half sister who has lung cancer.  Her parents are deceased.   The patient never knew her biological father.  There is no information on that side of the family.   The patient's mother had colon cancer over age 37.  She had eight siblings, two sisters had unknown cancers.  Her maternal grandparents are deceased. .  Nancy Harrison is aware of previous family history of genetic testing for hereditary cancer risks. Patient's maternal ancestors are of Caucasian descent, and paternal ancestors are of Caucasian descent. There is no reported Ashkenazi Jewish ancestry. There is no known consanguinity.  GENETIC TEST RESULTS: At the time of Ms. Griego's visit, we recommended she pursue genetic testing of the Common Hereditary Cancer Panel and CFTR. This test, which included sequencing and  deletion/duplication analysis of the genes listed on the test report, was performed at [facility]. Nancy Harrison was called today with her genetic test results. 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  Since Nancy Harrison has only one pathogenic mutation in CFTR and MUTYH, she is NOT affected with either Cystic Fibrosis OR  MYH-associated polyposis, but instead is a carrier for each. This result does NOT explain her diagnosis of cancer. A copy of the test report has been scanned into Epic and is located under the Molecular Pathology section of the Results Review tab.      SCREENING RECOMMENDATIONS: We discussed the implications of a heterozygous CFTR and MUTYH mutation for Nancy Harrison, and discussed who else in the family should have genetic testing.   While carriers for CFTR pathogenic mutations are at increased risk for certain health conditions, including bronchiectasis, allergic bronchopulmonary aspergillosis, asthma, chronic rhinosinusitis/nasal polyposis, atypical mycobacterial infections and aquagenic palmoplantar keratoderma, there are currently no medical management changes recommended for carriers.  Approximately 15% of individuals with acute recurrent pancreatitis or chronic pancreatitis are heterozygous for CFTR pathogenic mutations.     However, we recommended Nancy Harrison follow the most updated medical management guidelines (NCCN Guidelines v1.2023) for heterozygous MUTYH mutations; all of which are outlined below. These can be coordinated by Nancy Harrison's GI doctor or her primary provider.   Personal history of colon cancer  Follow instructions provided  by your physician based on your personal history.  Do not have a personal history of colon cancer but have a parent/sibling/child with colon cancer: Colonoscopy every 5 years  starting at age 49 or 34 years younger than the earliest age of onset, whichever is younger.  Do not have a personal history of colon cancer but do not have a parent/sibling/child with colon cancer: General population screening is appropriate.  FAMILY MEMBERS: Nancy Harrison youngest daughter has both of these single pathogenic mutations.  We should also test her other daughters.  Since we now know the mutation in Nancy Harrison, we can test other at-risk relatives (for example, Nancy Harrison's half-sister) to determine whether or not she has inherited these mutations and is at increased risk for cystic fibrosis or cancer. We will be happy to meet with any of the family members or refer them to a genetic counselor in their local area. To locate genetic counselors in other cities, individuals can visit the website of the [employer] (ArtistMovie.se) and Secretary/administrator for a Social worker by zip code.   We strongly encouraged Nancy Harrison to remain in contact with [place] in cancer genetics on an annual basis so we can update Nancy Harrison's personal and family histories, and inform her of advances in cancer genetics that may be of benefit for the entire family. Nancy Harrison knows she is also welcome to call with any questions or concerns, at any time.   Roma Kayser, [facility], [facility]  Licensed, Certified Genetic Counselor [email or web address] phone: [phone]

## 2021-12-09 DIAGNOSIS — Z432 Encounter for attention to ileostomy: Secondary | ICD-10-CM | POA: Diagnosis not present

## 2021-12-09 DIAGNOSIS — H269 Unspecified cataract: Secondary | ICD-10-CM | POA: Diagnosis not present

## 2021-12-09 DIAGNOSIS — D539 Nutritional anemia, unspecified: Secondary | ICD-10-CM | POA: Diagnosis not present

## 2021-12-09 DIAGNOSIS — R69 Illness, unspecified: Secondary | ICD-10-CM | POA: Diagnosis not present

## 2021-12-09 DIAGNOSIS — H538 Other visual disturbances: Secondary | ICD-10-CM | POA: Diagnosis not present

## 2021-12-09 DIAGNOSIS — C786 Secondary malignant neoplasm of retroperitoneum and peritoneum: Secondary | ICD-10-CM | POA: Diagnosis not present

## 2021-12-09 DIAGNOSIS — E785 Hyperlipidemia, unspecified: Secondary | ICD-10-CM | POA: Diagnosis not present

## 2021-12-09 DIAGNOSIS — E0500 Thyrotoxicosis with diffuse goiter without thyrotoxic crisis or storm: Secondary | ICD-10-CM | POA: Diagnosis not present

## 2021-12-09 DIAGNOSIS — Z853 Personal history of malignant neoplasm of breast: Secondary | ICD-10-CM | POA: Diagnosis not present

## 2021-12-09 DIAGNOSIS — C184 Malignant neoplasm of transverse colon: Secondary | ICD-10-CM | POA: Diagnosis not present

## 2021-12-09 DIAGNOSIS — Z96653 Presence of artificial knee joint, bilateral: Secondary | ICD-10-CM | POA: Diagnosis not present

## 2021-12-09 DIAGNOSIS — Z87891 Personal history of nicotine dependence: Secondary | ICD-10-CM | POA: Diagnosis not present

## 2021-12-09 DIAGNOSIS — E46 Unspecified protein-calorie malnutrition: Secondary | ICD-10-CM | POA: Diagnosis not present

## 2021-12-09 DIAGNOSIS — K2270 Barrett's esophagus without dysplasia: Secondary | ICD-10-CM | POA: Diagnosis not present

## 2021-12-09 DIAGNOSIS — Z431 Encounter for attention to gastrostomy: Secondary | ICD-10-CM | POA: Diagnosis not present

## 2021-12-09 DIAGNOSIS — Z9181 History of falling: Secondary | ICD-10-CM | POA: Diagnosis not present

## 2021-12-09 DIAGNOSIS — K219 Gastro-esophageal reflux disease without esophagitis: Secondary | ICD-10-CM | POA: Diagnosis not present

## 2021-12-09 DIAGNOSIS — M1990 Unspecified osteoarthritis, unspecified site: Secondary | ICD-10-CM | POA: Diagnosis not present

## 2021-12-09 DIAGNOSIS — I10 Essential (primary) hypertension: Secondary | ICD-10-CM | POA: Diagnosis not present

## 2021-12-09 DIAGNOSIS — Z483 Aftercare following surgery for neoplasm: Secondary | ICD-10-CM | POA: Diagnosis not present

## 2021-12-11 ENCOUNTER — Encounter: Payer: Self-pay | Admitting: Gastroenterology

## 2021-12-11 DIAGNOSIS — E0500 Thyrotoxicosis with diffuse goiter without thyrotoxic crisis or storm: Secondary | ICD-10-CM | POA: Diagnosis not present

## 2021-12-11 DIAGNOSIS — R69 Illness, unspecified: Secondary | ICD-10-CM | POA: Diagnosis not present

## 2021-12-11 DIAGNOSIS — D539 Nutritional anemia, unspecified: Secondary | ICD-10-CM | POA: Diagnosis not present

## 2021-12-11 DIAGNOSIS — C786 Secondary malignant neoplasm of retroperitoneum and peritoneum: Secondary | ICD-10-CM | POA: Diagnosis not present

## 2021-12-11 DIAGNOSIS — Z853 Personal history of malignant neoplasm of breast: Secondary | ICD-10-CM | POA: Diagnosis not present

## 2021-12-11 DIAGNOSIS — Z483 Aftercare following surgery for neoplasm: Secondary | ICD-10-CM | POA: Diagnosis not present

## 2021-12-11 DIAGNOSIS — E46 Unspecified protein-calorie malnutrition: Secondary | ICD-10-CM | POA: Diagnosis not present

## 2021-12-11 DIAGNOSIS — K2270 Barrett's esophagus without dysplasia: Secondary | ICD-10-CM | POA: Diagnosis not present

## 2021-12-11 DIAGNOSIS — H538 Other visual disturbances: Secondary | ICD-10-CM | POA: Diagnosis not present

## 2021-12-11 DIAGNOSIS — I10 Essential (primary) hypertension: Secondary | ICD-10-CM | POA: Diagnosis not present

## 2021-12-11 DIAGNOSIS — Z96653 Presence of artificial knee joint, bilateral: Secondary | ICD-10-CM | POA: Diagnosis not present

## 2021-12-11 DIAGNOSIS — Z431 Encounter for attention to gastrostomy: Secondary | ICD-10-CM | POA: Diagnosis not present

## 2021-12-11 DIAGNOSIS — E785 Hyperlipidemia, unspecified: Secondary | ICD-10-CM | POA: Diagnosis not present

## 2021-12-11 DIAGNOSIS — Z9181 History of falling: Secondary | ICD-10-CM | POA: Diagnosis not present

## 2021-12-11 DIAGNOSIS — C184 Malignant neoplasm of transverse colon: Secondary | ICD-10-CM | POA: Diagnosis not present

## 2021-12-11 DIAGNOSIS — M1990 Unspecified osteoarthritis, unspecified site: Secondary | ICD-10-CM | POA: Diagnosis not present

## 2021-12-11 DIAGNOSIS — Z432 Encounter for attention to ileostomy: Secondary | ICD-10-CM | POA: Diagnosis not present

## 2021-12-11 DIAGNOSIS — Z87891 Personal history of nicotine dependence: Secondary | ICD-10-CM | POA: Diagnosis not present

## 2021-12-11 DIAGNOSIS — H269 Unspecified cataract: Secondary | ICD-10-CM | POA: Diagnosis not present

## 2021-12-11 DIAGNOSIS — K219 Gastro-esophageal reflux disease without esophagitis: Secondary | ICD-10-CM | POA: Diagnosis not present

## 2021-12-14 DIAGNOSIS — E46 Unspecified protein-calorie malnutrition: Secondary | ICD-10-CM | POA: Diagnosis not present

## 2021-12-14 DIAGNOSIS — I10 Essential (primary) hypertension: Secondary | ICD-10-CM | POA: Diagnosis not present

## 2021-12-14 DIAGNOSIS — Z96653 Presence of artificial knee joint, bilateral: Secondary | ICD-10-CM | POA: Diagnosis not present

## 2021-12-14 DIAGNOSIS — Z853 Personal history of malignant neoplasm of breast: Secondary | ICD-10-CM | POA: Diagnosis not present

## 2021-12-14 DIAGNOSIS — C786 Secondary malignant neoplasm of retroperitoneum and peritoneum: Secondary | ICD-10-CM | POA: Diagnosis not present

## 2021-12-14 DIAGNOSIS — H538 Other visual disturbances: Secondary | ICD-10-CM | POA: Diagnosis not present

## 2021-12-14 DIAGNOSIS — H269 Unspecified cataract: Secondary | ICD-10-CM | POA: Diagnosis not present

## 2021-12-14 DIAGNOSIS — K219 Gastro-esophageal reflux disease without esophagitis: Secondary | ICD-10-CM | POA: Diagnosis not present

## 2021-12-14 DIAGNOSIS — Z431 Encounter for attention to gastrostomy: Secondary | ICD-10-CM | POA: Diagnosis not present

## 2021-12-14 DIAGNOSIS — Z483 Aftercare following surgery for neoplasm: Secondary | ICD-10-CM | POA: Diagnosis not present

## 2021-12-14 DIAGNOSIS — Z9181 History of falling: Secondary | ICD-10-CM | POA: Diagnosis not present

## 2021-12-14 DIAGNOSIS — Z87891 Personal history of nicotine dependence: Secondary | ICD-10-CM | POA: Diagnosis not present

## 2021-12-14 DIAGNOSIS — R69 Illness, unspecified: Secondary | ICD-10-CM | POA: Diagnosis not present

## 2021-12-14 DIAGNOSIS — E785 Hyperlipidemia, unspecified: Secondary | ICD-10-CM | POA: Diagnosis not present

## 2021-12-14 DIAGNOSIS — D539 Nutritional anemia, unspecified: Secondary | ICD-10-CM | POA: Diagnosis not present

## 2021-12-14 DIAGNOSIS — M1990 Unspecified osteoarthritis, unspecified site: Secondary | ICD-10-CM | POA: Diagnosis not present

## 2021-12-14 DIAGNOSIS — Z432 Encounter for attention to ileostomy: Secondary | ICD-10-CM | POA: Diagnosis not present

## 2021-12-14 DIAGNOSIS — K2270 Barrett's esophagus without dysplasia: Secondary | ICD-10-CM | POA: Diagnosis not present

## 2021-12-14 DIAGNOSIS — C184 Malignant neoplasm of transverse colon: Secondary | ICD-10-CM | POA: Diagnosis not present

## 2021-12-14 DIAGNOSIS — E0500 Thyrotoxicosis with diffuse goiter without thyrotoxic crisis or storm: Secondary | ICD-10-CM | POA: Diagnosis not present

## 2021-12-17 DIAGNOSIS — H538 Other visual disturbances: Secondary | ICD-10-CM | POA: Diagnosis not present

## 2021-12-17 DIAGNOSIS — C786 Secondary malignant neoplasm of retroperitoneum and peritoneum: Secondary | ICD-10-CM | POA: Diagnosis not present

## 2021-12-17 DIAGNOSIS — K2270 Barrett's esophagus without dysplasia: Secondary | ICD-10-CM | POA: Diagnosis not present

## 2021-12-17 DIAGNOSIS — Z9181 History of falling: Secondary | ICD-10-CM | POA: Diagnosis not present

## 2021-12-17 DIAGNOSIS — Z96653 Presence of artificial knee joint, bilateral: Secondary | ICD-10-CM | POA: Diagnosis not present

## 2021-12-17 DIAGNOSIS — K219 Gastro-esophageal reflux disease without esophagitis: Secondary | ICD-10-CM | POA: Diagnosis not present

## 2021-12-17 DIAGNOSIS — E0500 Thyrotoxicosis with diffuse goiter without thyrotoxic crisis or storm: Secondary | ICD-10-CM | POA: Diagnosis not present

## 2021-12-17 DIAGNOSIS — Z87891 Personal history of nicotine dependence: Secondary | ICD-10-CM | POA: Diagnosis not present

## 2021-12-17 DIAGNOSIS — Z483 Aftercare following surgery for neoplasm: Secondary | ICD-10-CM | POA: Diagnosis not present

## 2021-12-17 DIAGNOSIS — E46 Unspecified protein-calorie malnutrition: Secondary | ICD-10-CM | POA: Diagnosis not present

## 2021-12-17 DIAGNOSIS — H269 Unspecified cataract: Secondary | ICD-10-CM | POA: Diagnosis not present

## 2021-12-17 DIAGNOSIS — M1990 Unspecified osteoarthritis, unspecified site: Secondary | ICD-10-CM | POA: Diagnosis not present

## 2021-12-17 DIAGNOSIS — Z932 Ileostomy status: Secondary | ICD-10-CM | POA: Diagnosis not present

## 2021-12-17 DIAGNOSIS — Z432 Encounter for attention to ileostomy: Secondary | ICD-10-CM | POA: Diagnosis not present

## 2021-12-17 DIAGNOSIS — Z853 Personal history of malignant neoplasm of breast: Secondary | ICD-10-CM | POA: Diagnosis not present

## 2021-12-17 DIAGNOSIS — I10 Essential (primary) hypertension: Secondary | ICD-10-CM | POA: Diagnosis not present

## 2021-12-17 DIAGNOSIS — R69 Illness, unspecified: Secondary | ICD-10-CM | POA: Diagnosis not present

## 2021-12-17 DIAGNOSIS — D539 Nutritional anemia, unspecified: Secondary | ICD-10-CM | POA: Diagnosis not present

## 2021-12-17 DIAGNOSIS — C184 Malignant neoplasm of transverse colon: Secondary | ICD-10-CM | POA: Diagnosis not present

## 2021-12-17 DIAGNOSIS — E785 Hyperlipidemia, unspecified: Secondary | ICD-10-CM | POA: Diagnosis not present

## 2021-12-17 DIAGNOSIS — Z431 Encounter for attention to gastrostomy: Secondary | ICD-10-CM | POA: Diagnosis not present

## 2021-12-19 ENCOUNTER — Other Ambulatory Visit: Payer: Self-pay

## 2021-12-20 ENCOUNTER — Other Ambulatory Visit: Payer: Self-pay | Admitting: Oncology

## 2021-12-20 ENCOUNTER — Encounter: Payer: Self-pay | Admitting: Oncology

## 2021-12-20 DIAGNOSIS — C481 Malignant neoplasm of specified parts of peritoneum: Secondary | ICD-10-CM

## 2021-12-22 DIAGNOSIS — C184 Malignant neoplasm of transverse colon: Secondary | ICD-10-CM | POA: Diagnosis not present

## 2021-12-22 DIAGNOSIS — E46 Unspecified protein-calorie malnutrition: Secondary | ICD-10-CM | POA: Diagnosis not present

## 2021-12-22 DIAGNOSIS — E0500 Thyrotoxicosis with diffuse goiter without thyrotoxic crisis or storm: Secondary | ICD-10-CM | POA: Diagnosis not present

## 2021-12-22 DIAGNOSIS — K219 Gastro-esophageal reflux disease without esophagitis: Secondary | ICD-10-CM | POA: Diagnosis not present

## 2021-12-22 DIAGNOSIS — R69 Illness, unspecified: Secondary | ICD-10-CM | POA: Diagnosis not present

## 2021-12-22 DIAGNOSIS — D539 Nutritional anemia, unspecified: Secondary | ICD-10-CM | POA: Diagnosis not present

## 2021-12-22 DIAGNOSIS — Z483 Aftercare following surgery for neoplasm: Secondary | ICD-10-CM | POA: Diagnosis not present

## 2021-12-22 DIAGNOSIS — Z853 Personal history of malignant neoplasm of breast: Secondary | ICD-10-CM | POA: Diagnosis not present

## 2021-12-22 DIAGNOSIS — C786 Secondary malignant neoplasm of retroperitoneum and peritoneum: Secondary | ICD-10-CM | POA: Diagnosis not present

## 2021-12-22 DIAGNOSIS — Z9181 History of falling: Secondary | ICD-10-CM | POA: Diagnosis not present

## 2021-12-22 DIAGNOSIS — Z96653 Presence of artificial knee joint, bilateral: Secondary | ICD-10-CM | POA: Diagnosis not present

## 2021-12-22 DIAGNOSIS — I10 Essential (primary) hypertension: Secondary | ICD-10-CM | POA: Diagnosis not present

## 2021-12-22 DIAGNOSIS — Z432 Encounter for attention to ileostomy: Secondary | ICD-10-CM | POA: Diagnosis not present

## 2021-12-22 DIAGNOSIS — H538 Other visual disturbances: Secondary | ICD-10-CM | POA: Diagnosis not present

## 2021-12-22 DIAGNOSIS — Z87891 Personal history of nicotine dependence: Secondary | ICD-10-CM | POA: Diagnosis not present

## 2021-12-22 DIAGNOSIS — M1990 Unspecified osteoarthritis, unspecified site: Secondary | ICD-10-CM | POA: Diagnosis not present

## 2021-12-22 DIAGNOSIS — K2270 Barrett's esophagus without dysplasia: Secondary | ICD-10-CM | POA: Diagnosis not present

## 2021-12-22 DIAGNOSIS — H269 Unspecified cataract: Secondary | ICD-10-CM | POA: Diagnosis not present

## 2021-12-22 DIAGNOSIS — Z431 Encounter for attention to gastrostomy: Secondary | ICD-10-CM | POA: Diagnosis not present

## 2021-12-22 DIAGNOSIS — E785 Hyperlipidemia, unspecified: Secondary | ICD-10-CM | POA: Diagnosis not present

## 2021-12-24 ENCOUNTER — Inpatient Hospital Stay: Payer: Medicare HMO | Attending: Oncology

## 2021-12-24 ENCOUNTER — Encounter: Payer: Self-pay | Admitting: *Deleted

## 2021-12-24 ENCOUNTER — Inpatient Hospital Stay: Payer: Medicare HMO | Admitting: Oncology

## 2021-12-24 ENCOUNTER — Inpatient Hospital Stay: Payer: Medicare HMO

## 2021-12-24 VITALS — BP 157/87 | HR 100 | Temp 98.1°F | Resp 18 | Ht 59.0 in | Wt 124.6 lb

## 2021-12-24 VITALS — BP 147/86 | HR 100 | Resp 20

## 2021-12-24 DIAGNOSIS — C481 Malignant neoplasm of specified parts of peritoneum: Secondary | ICD-10-CM | POA: Diagnosis not present

## 2021-12-24 DIAGNOSIS — Z5111 Encounter for antineoplastic chemotherapy: Secondary | ICD-10-CM | POA: Insufficient documentation

## 2021-12-24 DIAGNOSIS — Z23 Encounter for immunization: Secondary | ICD-10-CM

## 2021-12-24 DIAGNOSIS — Z1501 Genetic susceptibility to malignant neoplasm of breast: Secondary | ICD-10-CM | POA: Diagnosis not present

## 2021-12-24 DIAGNOSIS — R630 Anorexia: Secondary | ICD-10-CM | POA: Diagnosis not present

## 2021-12-24 DIAGNOSIS — Z931 Gastrostomy status: Secondary | ICD-10-CM | POA: Diagnosis not present

## 2021-12-24 DIAGNOSIS — Z1509 Genetic susceptibility to other malignant neoplasm: Secondary | ICD-10-CM | POA: Insufficient documentation

## 2021-12-24 DIAGNOSIS — Z1502 Genetic susceptibility to malignant neoplasm of ovary: Secondary | ICD-10-CM | POA: Insufficient documentation

## 2021-12-24 DIAGNOSIS — M79659 Pain in unspecified thigh: Secondary | ICD-10-CM | POA: Diagnosis not present

## 2021-12-24 DIAGNOSIS — R519 Headache, unspecified: Secondary | ICD-10-CM | POA: Insufficient documentation

## 2021-12-24 DIAGNOSIS — Z5189 Encounter for other specified aftercare: Secondary | ICD-10-CM | POA: Diagnosis not present

## 2021-12-24 DIAGNOSIS — C482 Malignant neoplasm of peritoneum, unspecified: Secondary | ICD-10-CM | POA: Insufficient documentation

## 2021-12-24 DIAGNOSIS — R5383 Other fatigue: Secondary | ICD-10-CM | POA: Insufficient documentation

## 2021-12-24 DIAGNOSIS — C786 Secondary malignant neoplasm of retroperitoneum and peritoneum: Secondary | ICD-10-CM

## 2021-12-24 DIAGNOSIS — Z853 Personal history of malignant neoplasm of breast: Secondary | ICD-10-CM | POA: Insufficient documentation

## 2021-12-24 LAB — CBC WITH DIFFERENTIAL (CANCER CENTER ONLY)
Abs Immature Granulocytes: 0.02 10*3/uL (ref 0.00–0.07)
Basophils Absolute: 0.1 10*3/uL (ref 0.0–0.1)
Basophils Relative: 1 %
Eosinophils Absolute: 0.1 10*3/uL (ref 0.0–0.5)
Eosinophils Relative: 2 %
HCT: 31.1 % — ABNORMAL LOW (ref 36.0–46.0)
Hemoglobin: 10.1 g/dL — ABNORMAL LOW (ref 12.0–15.0)
Immature Granulocytes: 0 %
Lymphocytes Relative: 19 %
Lymphs Abs: 1.2 10*3/uL (ref 0.7–4.0)
MCH: 33.6 pg (ref 26.0–34.0)
MCHC: 32.5 g/dL (ref 30.0–36.0)
MCV: 103.3 fL — ABNORMAL HIGH (ref 80.0–100.0)
Monocytes Absolute: 0.8 10*3/uL (ref 0.1–1.0)
Monocytes Relative: 13 %
Neutro Abs: 4.1 10*3/uL (ref 1.7–7.7)
Neutrophils Relative %: 65 %
Platelet Count: 301 10*3/uL (ref 150–400)
RBC: 3.01 MIL/uL — ABNORMAL LOW (ref 3.87–5.11)
RDW: 16.4 % — ABNORMAL HIGH (ref 11.5–15.5)
WBC Count: 6.3 10*3/uL (ref 4.0–10.5)
nRBC: 0 % (ref 0.0–0.2)

## 2021-12-24 LAB — CMP (CANCER CENTER ONLY)
ALT: 14 U/L (ref 0–44)
AST: 21 U/L (ref 15–41)
Albumin: 4 g/dL (ref 3.5–5.0)
Alkaline Phosphatase: 67 U/L (ref 38–126)
Anion gap: 8 (ref 5–15)
BUN: 18 mg/dL (ref 8–23)
CO2: 30 mmol/L (ref 22–32)
Calcium: 10.1 mg/dL (ref 8.9–10.3)
Chloride: 103 mmol/L (ref 98–111)
Creatinine: 0.59 mg/dL (ref 0.44–1.00)
GFR, Estimated: >60 mL/min (ref >60)
Glucose, Bld: 98 mg/dL (ref 70–99)
Potassium: 3.4 mmol/L — ABNORMAL LOW (ref 3.5–5.1)
Sodium: 141 mmol/L (ref 135–145)
Total Bilirubin: 0.4 mg/dL (ref 0.3–1.2)
Total Protein: 6.9 g/dL (ref 6.5–8.1)

## 2021-12-24 LAB — MAGNESIUM: Magnesium: 1.7 mg/dL (ref 1.7–2.4)

## 2021-12-24 MED ORDER — SODIUM CHLORIDE 0.9 % IV SOLN: Freq: Once | INTRAVENOUS | Status: AC

## 2021-12-24 MED ORDER — DIPHENHYDRAMINE HCL 50 MG/ML IJ SOLN
25.0000 mg | Freq: Once | INTRAMUSCULAR | Status: AC
  Administered 2021-12-24: 25 mg via INTRAVENOUS
  Filled 2021-12-24: qty 1

## 2021-12-24 MED ORDER — FAMOTIDINE IN NACL 20-0.9 MG/50ML-% IV SOLN
20.0000 mg | Freq: Once | INTRAVENOUS | Status: AC
  Administered 2021-12-24: 20 mg via INTRAVENOUS
  Filled 2021-12-24: qty 50

## 2021-12-24 MED ORDER — SODIUM CHLORIDE 0.9 % IV SOLN
150.0000 mg | Freq: Once | INTRAVENOUS | Status: AC
  Administered 2021-12-24: 150 mg via INTRAVENOUS
  Filled 2021-12-24: qty 5

## 2021-12-24 MED ORDER — SODIUM CHLORIDE 0.9 % IV SOLN
318.0000 mg | Freq: Once | INTRAVENOUS | Status: AC
  Administered 2021-12-24: 320 mg via INTRAVENOUS
  Filled 2021-12-24: qty 32

## 2021-12-24 MED ORDER — MAGNESIUM SULFATE 2 GM/50ML IV SOLN
2.0000 g | Freq: Once | INTRAVENOUS | Status: AC
  Administered 2021-12-24: 2 g via INTRAVENOUS
  Filled 2021-12-24: qty 50

## 2021-12-24 MED ORDER — INFLUENZA VAC A&B SA ADJ QUAD 0.5 ML IM PRSY
0.5000 mL | PREFILLED_SYRINGE | Freq: Once | INTRAMUSCULAR | Status: AC
  Administered 2021-12-24: 0.5 mL via INTRAMUSCULAR
  Filled 2021-12-24: qty 0.5

## 2021-12-24 MED ORDER — SODIUM CHLORIDE 0.9% FLUSH
10.0000 mL | INTRAVENOUS | Status: DC | PRN
  Administered 2021-12-24: 10 mL

## 2021-12-24 MED ORDER — HEPARIN SOD (PORK) LOCK FLUSH 100 UNIT/ML IV SOLN
500.0000 [IU] | Freq: Once | INTRAVENOUS | Status: AC | PRN
  Administered 2021-12-24: 500 [IU]

## 2021-12-24 MED ORDER — PALONOSETRON HCL INJECTION 0.25 MG/5ML
0.2500 mg | Freq: Once | INTRAVENOUS | Status: AC
  Administered 2021-12-24: 0.25 mg via INTRAVENOUS
  Filled 2021-12-24: qty 5

## 2021-12-24 MED ORDER — SODIUM CHLORIDE 0.9 % IV SOLN
175.0000 mg/m2 | Freq: Once | INTRAVENOUS | Status: AC
  Administered 2021-12-24: 258 mg via INTRAVENOUS
  Filled 2021-12-24: qty 43

## 2021-12-24 MED ORDER — SODIUM CHLORIDE 0.9 % IV SOLN
10.0000 mg | Freq: Once | INTRAVENOUS | Status: AC
  Administered 2021-12-24: 10 mg via INTRAVENOUS
  Filled 2021-12-24: qty 1

## 2021-12-24 NOTE — Progress Notes (Signed)
[facility] OFFICE PROGRESS NOTE   Diagnosis: Peritoneal carcinoma  INTERVAL HISTORY:   Nancy Harrison returns as scheduled.  She completed another cycle of Taxol/carboplatin on 12/03/2021.  No nausea/vomiting or symptoms of an allergic reaction.  She reports mild peripheral numbness.  This does not interfere with activity.  She is not using the venting gastrostomy tube and would like to have this removed.  She empties the ileostomy 3-4 times per day.  The stool is variably formed. She complains of pain at the right buttock and upper thigh.  No swelling.  She is not taking pain medication.  Objective:  Vital signs in last 24 hours:  Blood pressure (!) 157/87, pulse 100, temperature 98.1 F (36.7 C), temperature source Oral, resp. rate 18, height _0  (1.499 m), weight 124 lb 9.6 oz (56.5 kg), SpO2 99 %.     Resp: Lungs clear bilaterally Cardio: Regular rate and rhythm GI: No mass, no hepatosplenomegaly, right abdomen ileostomy with semiformed brown stool Vascular: No leg edema or erythema Musculoskeletal: Tender at the right ischium and buttock.  No mass.  No leg erythema or swelling.  No tenderness at the right thigh  Portacath/PICC-without erythema  Lab Results:  Lab Results  Component Value Date   WBC 6.3 12/24/2021   HGB 10.1 (L) 12/24/2021   HCT 31.1 (L) 12/24/2021   MCV 103.3 (H) 12/24/2021   PLT 301 12/24/2021   NEUTROABS 4.1 12/24/2021    CMP  Lab Results  Component Value Date   NA 141 12/24/2021   K 3.4 (L) 12/24/2021   CL 103 12/24/2021   CO2 30 12/24/2021   GLUCOSE 98 12/24/2021   BUN 18 12/24/2021   CREATININE 0.59 12/24/2021   CALCIUM 10.1 12/24/2021   PROT 6.9 12/24/2021   ALBUMIN 4.0 12/24/2021   AST 21 12/24/2021   ALT 14 12/24/2021   ALKPHOS 67 12/24/2021   BILITOT 0.4 12/24/2021   GFRNONAA >60 12/24/2021    Lab Results  Component Value Date   CEA1 2.5 10/18/2021    Medications: I have reviewed the patient's current  medications.   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  Bone lesions concerning for metastases, right lung nodule Laparoscopy, diverting loop ileostomy, gastrostomy tube placement, peritoneal biopsy 10/16/2021, no evidence of a primary tumor site at the appendix, ovaries, or uterus.  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  CDX2, CK20, and GATA3 negative.  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   Abdominal distention/pain and diarrhea secondary to #1 3.   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  Lumpectomy and adjuvant CMF x8 followed by radiation 4. Hypertension 5. Hypothyroidism 6. Barrett's esophagus 7. Family history of multiple cancers including appendix, colon, and lung cancer 8.  Hospital admission 11/05/2021 with dehydration/prerenal azotemia secondary to nausea and high output ileostomy 9.  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  She has completed 2 cycles of Taxol/carboplatin.  She has tolerated the  chemotherapy well and her performance status is improved.  She will complete cycle 3 today.  We will follow-up on the CA125 from today.  She would like to have the gastrostomy tube removed.  She has no nausea.  She has discomfort at the right upper posterior thigh and buttock.  I have a low clinical suspicion for a DVT.  We will proceed with a bone scan to evaluate her pain and the lesions noted on CT.  I recommended she alert her family members of the genetic testing results so they can undergo appropriate screening.  She will return for an office visit and chemotherapy in 3 weeks.  We will coordinate a restaging plan with Dr. Berline Lopes.  She will begin a potassium supplement and we will administer IV magnesium today.  [name], MD  12/24/2021  9:37 AM

## 2021-12-24 NOTE — Patient Instructions (Signed)
[facility]   Discharge Instructions: Thank you for choosing [facility] to provide your oncology and hematology care.   If you have a lab appointment with the Cancer Center, please go directly to the Cancer Center and check in at the registration area.   Wear comfortable clothing and clothing appropriate for easy access to any Portacath or PICC line.   We strive to give you quality time with your provider. You may need to reschedule your appointment if you arrive late (15 or more minutes).  Arriving late affects you and other patients whose appointments are after yours.  Also, if you miss three or more appointments without notifying the office, you may be dismissed from the clinic at the provider's discretion.      For prescription refill requests, have your pharmacy contact our office and allow 72 hours for refills to be completed.    Today you received the following chemotherapy and/or immunotherapy agents Paclitaxel (TAXOL) & Carboplatin (PARAPLATIN).      To help prevent nausea and vomiting after your treatment, we encourage you to take your nausea medication as directed.  BELOW ARE SYMPTOMS THAT SHOULD BE REPORTED IMMEDIATELY: *FEVER GREATER THAN 100.4 F (38 C) OR HIGHER *CHILLS OR SWEATING *NAUSEA AND VOMITING THAT IS NOT CONTROLLED WITH YOUR NAUSEA MEDICATION *UNUSUAL SHORTNESS OF BREATH *UNUSUAL BRUISING OR BLEEDING *URINARY PROBLEMS (pain or burning when urinating, or frequent urination) *BOWEL PROBLEMS (unusual diarrhea, constipation, pain near the anus) TENDERNESS IN MOUTH AND THROAT WITH OR WITHOUT PRESENCE OF ULCERS (sore throat, sores in mouth, or a toothache) UNUSUAL RASH, SWELLING OR PAIN  UNUSUAL VAGINAL DISCHARGE OR ITCHING   Items with * indicate a potential emergency and should be followed up as soon as possible or go to the Emergency Department if any problems should occur.  Please show the CHEMOTHERAPY ALERT CARD or  IMMUNOTHERAPY ALERT CARD at check-in to the Emergency Department and triage nurse.  Should you have questions after your visit or need to cancel or reschedule your appointment, please contact [facility]  Dept: [phone]  and follow the prompts.  Office hours are 8:00 a.m. to 4:30 p.m. Monday - Friday. Please note that voicemails left after 4:00 p.m. may not be returned until the following business day.  We are closed weekends and major holidays. You have access to a nurse at all times for urgent questions. Please call the main number to the clinic Dept: [phone] and follow the prompts.   For any non-urgent questions, you may also contact your provider using MyChart. We now offer e-Visits for anyone 18 and older to request care online for non-urgent symptoms. For details visit [email or web address].   Also download the MyChart app! Go to the app store, search "MyChart", open the app, select [facility], and log in with your MyChart username and password.  Masks are optional in the cancer centers. If you would like for your care team to wear a mask while they are taking care of you, please let them know. You may have one support person who is at least 79 years old accompany you for your appointments.  Paclitaxel Injection What is this medication? PACLITAXEL (PAK li TAX el) treats some types of cancer. It works by slowing down the growth of cancer cells. This medicine may be used for other purposes; ask your health care provider or pharmacist if you have questions. COMMON BRAND NAME(S): Onxol, Taxol What should I tell my care   team before I take this medication? They need to know if you have any of these conditions: Heart disease Liver disease Low white blood cell levels An unusual or allergic reaction to paclitaxel, other medications, foods, dyes, or preservatives If you or your partner are pregnant or trying to get pregnant Breast-feeding How should I use  this medication? This medication is injected into a vein. It is given by your care team in a hospital or clinic setting. Talk to your care team about the use of this medication in children. While it may be given to children for selected conditions, precautions do apply. Overdosage: If you think you have taken too much of this medicine contact a poison control center or emergency room at once. NOTE: This medicine is only for you. Do not share this medicine with others. What if I miss a dose? Keep appointments for follow-up doses. It is important not to miss your dose. Call your care team if you are unable to keep an appointment. What may interact with this medication? Do not take this medication with any of the following: Live virus vaccines Other medications may affect the way this medication works. Talk with your care team about all of the medications you take. They may suggest changes to your treatment plan to lower the risk of side effects and to make sure your medications work as intended. This list may not describe all possible interactions. Give your health care provider a list of all the medicines, herbs, non-prescription drugs, or dietary supplements you use. Also tell them if you smoke, drink alcohol, or use illegal drugs. Some items may interact with your medicine. What should I watch for while using this medication? Your condition will be monitored carefully while you are receiving this medication. You may need blood work while taking this medication. This medication may make you feel generally unwell. This is not uncommon as chemotherapy can affect healthy cells as well as cancer cells. Report any side effects. Continue your course of treatment even though you feel ill unless your care team tells you to stop. This medication can cause serious allergic reactions. To reduce the risk, your care team may give you other medications to take before receiving this one. Be sure to follow the  directions from your care team. This medication may increase your risk of getting an infection. Call your care team for advice if you get a fever, chills, sore throat, or other symptoms of a cold or flu. Do not treat yourself. Try to avoid being around people who are sick. This medication may increase your risk to bruise or bleed. Call your care team if you notice any unusual bleeding. Be careful brushing or flossing your teeth or using a toothpick because you may get an infection or bleed more easily. If you have any dental work done, tell your dentist you are receiving this medication. Talk to your care team if you may be pregnant. Serious birth defects can occur if you take this medication during pregnancy. Talk to your care team before breastfeeding. Changes to your treatment plan may be needed. What side effects may I notice from receiving this medication? Side effects that you should report to your care team as soon as possible: Allergic reactions--skin rash, itching, hives, swelling of the face, lips, tongue, or throat Heart rhythm changes--fast or irregular heartbeat, dizziness, feeling faint or lightheaded, chest pain, trouble breathing Increase in blood pressure Infection--fever, chills, cough, sore throat, wounds that don't heal, pain or trouble when   passing urine, general feeling of discomfort or being unwell Low blood pressure--dizziness, feeling faint or lightheaded, blurry vision Low red blood cell level--unusual weakness or fatigue, dizziness, headache, trouble breathing Painful swelling, warmth, or redness of the skin, blisters or sores at the infusion site Pain, tingling, or numbness in the hands or feet Slow heartbeat--dizziness, feeling faint or lightheaded, confusion, trouble breathing, unusual weakness or fatigue Unusual bruising or bleeding Side effects that usually do not require medical attention (report to your care team if they continue or are bothersome): Diarrhea Hair  loss Joint pain Loss of appetite Muscle pain Nausea Vomiting This list may not describe all possible side effects. Call your doctor for medical advice about side effects. You may report side effects to FDA at 1-800-FDA-1088. Where should I keep my medication? This medication is given in a hospital or clinic. It will not be stored at home. NOTE: This sheet is a summary. It may not cover all possible information. If you have questions about this medicine, talk to your doctor, pharmacist, or health care provider.  2023 Elsevier/Gold Standard (2021-06-10 00:00:00)  Carboplatin Injection What is this medication? CARBOPLATIN (KAR boe pla tin) treats some types of cancer. It works by slowing down the growth of cancer cells. This medicine may be used for other purposes; ask your health care provider or pharmacist if you have questions. COMMON BRAND NAME(S): Paraplatin What should I tell my care team before I take this medication? They need to know if you have any of these conditions: Blood disorders Hearing problems Kidney disease Recent or ongoing radiation therapy An unusual or allergic reaction to carboplatin, cisplatin, other medications, foods, dyes, or preservatives Pregnant or trying to get pregnant Breast-feeding How should I use this medication? This medication is injected into a vein. It is given by your care team in a hospital or clinic setting. Talk to your care team about the use of this medication in children. Special care may be needed. Overdosage: If you think you have taken too much of this medicine contact a poison control center or emergency room at once. NOTE: This medicine is only for you. Do not share this medicine with others. What if I miss a dose? Keep appointments for follow-up doses. It is important not to miss your dose. Call your care team if you are unable to keep an appointment. What may interact with this medication? Medications for seizures Some  antibiotics, such as amikacin, gentamicin, neomycin, streptomycin, tobramycin Vaccines This list may not describe all possible interactions. Give your health care provider a list of all the medicines, herbs, non-prescription drugs, or dietary supplements you use. Also tell them if you smoke, drink alcohol, or use illegal drugs. Some items may interact with your medicine. What should I watch for while using this medication? Your condition will be monitored carefully while you are receiving this medication. You may need blood work while taking this medication. This medication may make you feel generally unwell. This is not uncommon, as chemotherapy can affect healthy cells as well as cancer cells. Report any side effects. Continue your course of treatment even though you feel ill unless your care team tells you to stop. In some cases, you may be given additional medications to help with side effects. Follow all directions for their use. This medication may increase your risk of getting an infection. Call your care team for advice if you get a fever, chills, sore throat, or other symptoms of a cold or flu. Do not treat   yourself. Try to avoid being around people who are sick. Avoid taking medications that contain aspirin, acetaminophen, ibuprofen, naproxen, or ketoprofen unless instructed by your care team. These medications may hide a fever. Be careful brushing or flossing your teeth or using a toothpick because you may get an infection or bleed more easily. If you have any dental work done, tell your dentist you are receiving this medication. Talk to your care team if you wish to become pregnant or think you might be pregnant. This medication can cause serious birth defects. Talk to your care team about effective forms of contraception. Do not breast-feed while taking this medication. What side effects may I notice from receiving this medication? Side effects that you should report to your care team as  soon as possible: Allergic reactions--skin rash, itching, hives, swelling of the face, lips, tongue, or throat Infection--fever, chills, cough, sore throat, wounds that don't heal, pain or trouble when passing urine, general feeling of discomfort or being unwell Low red blood cell level--unusual weakness or fatigue, dizziness, headache, trouble breathing Pain, tingling, or numbness in the hands or feet, muscle weakness, change in vision, confusion or trouble speaking, loss of balance or coordination, trouble walking, seizures Unusual bruising or bleeding Side effects that usually do not require medical attention (report to your care team if they continue or are bothersome): Hair loss Nausea Unusual weakness or fatigue Vomiting This list may not describe all possible side effects. Call your doctor for medical advice about side effects. You may report side effects to FDA at 1-800-FDA-1088. Where should I keep my medication? This medication is given in a hospital or clinic. It will not be stored at home. NOTE: This sheet is a summary. It may not cover all possible information. If you have questions about this medicine, talk to your doctor, pharmacist, or health care provider.  2023 Elsevier/Gold Standard (2021-05-25 00:00:00) 

## 2021-12-24 NOTE — Progress Notes (Addendum)
Patient seen by Dr. Benay Spice today  Vitals are within treatment parameters.  No intervention for BP 157/87  Labs reviewed by Dr. Benay Spice and are within treatment parameters.  Per physician team, ready for treatment with modifications: give 2 grams IV Mg+ today.  Patient requesting flu vaccine today

## 2021-12-25 ENCOUNTER — Inpatient Hospital Stay: Payer: Medicare HMO

## 2021-12-25 ENCOUNTER — Other Ambulatory Visit: Payer: Self-pay | Admitting: *Deleted

## 2021-12-25 ENCOUNTER — Encounter: Payer: Self-pay | Admitting: Oncology

## 2021-12-25 ENCOUNTER — Other Ambulatory Visit: Payer: Self-pay

## 2021-12-25 ENCOUNTER — Other Ambulatory Visit (HOSPITAL_BASED_OUTPATIENT_CLINIC_OR_DEPARTMENT_OTHER): Payer: Self-pay

## 2021-12-25 ENCOUNTER — Encounter: Payer: Self-pay | Admitting: *Deleted

## 2021-12-25 VITALS — BP 157/85 | HR 100 | Temp 97.9°F | Resp 18

## 2021-12-25 DIAGNOSIS — Z1502 Genetic susceptibility to malignant neoplasm of ovary: Secondary | ICD-10-CM | POA: Diagnosis not present

## 2021-12-25 DIAGNOSIS — Z1501 Genetic susceptibility to malignant neoplasm of breast: Secondary | ICD-10-CM | POA: Diagnosis not present

## 2021-12-25 DIAGNOSIS — R5383 Other fatigue: Secondary | ICD-10-CM | POA: Diagnosis not present

## 2021-12-25 DIAGNOSIS — Z1509 Genetic susceptibility to other malignant neoplasm: Secondary | ICD-10-CM | POA: Diagnosis not present

## 2021-12-25 DIAGNOSIS — Z5111 Encounter for antineoplastic chemotherapy: Secondary | ICD-10-CM | POA: Diagnosis not present

## 2021-12-25 DIAGNOSIS — R630 Anorexia: Secondary | ICD-10-CM | POA: Diagnosis not present

## 2021-12-25 DIAGNOSIS — C481 Malignant neoplasm of specified parts of peritoneum: Secondary | ICD-10-CM

## 2021-12-25 DIAGNOSIS — M79659 Pain in unspecified thigh: Secondary | ICD-10-CM | POA: Diagnosis not present

## 2021-12-25 DIAGNOSIS — Z5189 Encounter for other specified aftercare: Secondary | ICD-10-CM | POA: Diagnosis not present

## 2021-12-25 DIAGNOSIS — Z23 Encounter for immunization: Secondary | ICD-10-CM | POA: Diagnosis not present

## 2021-12-25 DIAGNOSIS — C482 Malignant neoplasm of peritoneum, unspecified: Secondary | ICD-10-CM | POA: Diagnosis not present

## 2021-12-25 DIAGNOSIS — Z931 Gastrostomy status: Secondary | ICD-10-CM | POA: Diagnosis not present

## 2021-12-25 DIAGNOSIS — R519 Headache, unspecified: Secondary | ICD-10-CM | POA: Diagnosis not present

## 2021-12-25 DIAGNOSIS — Z853 Personal history of malignant neoplasm of breast: Secondary | ICD-10-CM | POA: Diagnosis not present

## 2021-12-25 LAB — CA 125: Cancer Antigen (CA) 125: 96.3 U/mL — ABNORMAL HIGH (ref 0.0–38.1)

## 2021-12-25 MED ORDER — POTASSIUM CHLORIDE ER 10 MEQ PO CPCR: 10.0000 meq | ORAL_CAPSULE | Freq: Two times a day (BID) | ORAL | 1 refills | Status: DC

## 2021-12-25 MED ORDER — PEGFILGRASTIM-JMDB 6 MG/0.6ML ~~LOC~~ SOSY
6.0000 mg | PREFILLED_SYRINGE | Freq: Once | SUBCUTANEOUS | Status: AC
  Administered 2021-12-25: 6 mg via SUBCUTANEOUS
  Filled 2021-12-25: qty 0.6

## 2021-12-25 MED ORDER — COMIRNATY 30 MCG/0.3ML IM SUSY
PREFILLED_SYRINGE | INTRAMUSCULAR | 0 refills | Status: DC
  Filled 2021-12-25: qty 0.3, 1d supply, fill #0

## 2021-12-25 NOTE — Patient Instructions (Signed)

## 2021-12-25 NOTE — Progress Notes (Signed)
Faxed office note with request for G-tube removal to Dr. Johney Maine at [facility].

## 2021-12-29 ENCOUNTER — Other Ambulatory Visit: Payer: Medicare Other

## 2021-12-29 ENCOUNTER — Encounter: Payer: Self-pay | Admitting: Oncology

## 2021-12-29 ENCOUNTER — Encounter: Payer: Medicare Other | Admitting: Genetic Counselor

## 2022-01-05 ENCOUNTER — Encounter (HOSPITAL_COMMUNITY)
Admission: RE | Admit: 2022-01-05 | Discharge: 2022-01-05 | Disposition: A | Discharge status: Home / Self Care | Payer: Medicare HMO | Source: Ambulatory Visit | Attending: Gynecologic Oncology | Admitting: Gynecologic Oncology

## 2022-01-05 DIAGNOSIS — Z8544 Personal history of malignant neoplasm of other female genital organs: Secondary | ICD-10-CM | POA: Diagnosis not present

## 2022-01-05 DIAGNOSIS — C786 Secondary malignant neoplasm of retroperitoneum and peritoneum: Secondary | ICD-10-CM | POA: Diagnosis not present

## 2022-01-05 DIAGNOSIS — M419 Scoliosis, unspecified: Secondary | ICD-10-CM | POA: Diagnosis not present

## 2022-01-05 MED ORDER — TECHNETIUM TC 99M MEDRONATE IV KIT
20.0000 | PACK | Freq: Once | INTRAVENOUS | Status: AC | PRN
  Administered 2022-01-05: 19.3 via INTRAVENOUS

## 2022-01-06 ENCOUNTER — Encounter (HOSPITAL_COMMUNITY): Payer: Self-pay | Admitting: Nurse Practitioner

## 2022-01-06 ENCOUNTER — Encounter: Payer: Self-pay | Admitting: Oncology

## 2022-01-08 ENCOUNTER — Telehealth: Payer: Self-pay | Admitting: *Deleted

## 2022-01-08 NOTE — Telephone Encounter (Signed)
Spoke with the patient and her husband explained "Dr Berline Lopes is holding an OR spot for her on 12/20. She would like to see you for an appt on 12/5 to discuss the surgery." Patient stated "I don't want to say yes to surgery without talking with the doctor first." Explained "the surgery schedule is filling up quickly and we are just holding the spot in case she needed it." Also Explained that the message would be given to the Dr Chancy Hurter APP and the office would call her back early next week.

## 2022-01-10 ENCOUNTER — Other Ambulatory Visit: Payer: Self-pay | Admitting: Oncology

## 2022-01-10 DIAGNOSIS — C481 Malignant neoplasm of specified parts of peritoneum: Secondary | ICD-10-CM

## 2022-01-11 DIAGNOSIS — Z932 Ileostomy status: Secondary | ICD-10-CM | POA: Diagnosis not present

## 2022-01-12 ENCOUNTER — Inpatient Hospital Stay: Payer: Medicare HMO

## 2022-01-12 ENCOUNTER — Encounter: Payer: Self-pay | Admitting: Oncology

## 2022-01-12 ENCOUNTER — Inpatient Hospital Stay: Payer: Medicare HMO | Admitting: Nurse Practitioner

## 2022-01-12 ENCOUNTER — Encounter: Payer: Self-pay | Admitting: Nurse Practitioner

## 2022-01-12 DIAGNOSIS — Z931 Gastrostomy status: Secondary | ICD-10-CM | POA: Diagnosis not present

## 2022-01-12 DIAGNOSIS — R630 Anorexia: Secondary | ICD-10-CM | POA: Diagnosis not present

## 2022-01-12 DIAGNOSIS — Z23 Encounter for immunization: Secondary | ICD-10-CM | POA: Diagnosis not present

## 2022-01-12 DIAGNOSIS — C786 Secondary malignant neoplasm of retroperitoneum and peritoneum: Secondary | ICD-10-CM

## 2022-01-12 DIAGNOSIS — R5383 Other fatigue: Secondary | ICD-10-CM | POA: Diagnosis not present

## 2022-01-12 DIAGNOSIS — Z5111 Encounter for antineoplastic chemotherapy: Secondary | ICD-10-CM | POA: Diagnosis not present

## 2022-01-12 DIAGNOSIS — M79659 Pain in unspecified thigh: Secondary | ICD-10-CM | POA: Diagnosis not present

## 2022-01-12 DIAGNOSIS — C482 Malignant neoplasm of peritoneum, unspecified: Secondary | ICD-10-CM | POA: Diagnosis not present

## 2022-01-12 DIAGNOSIS — C481 Malignant neoplasm of specified parts of peritoneum: Secondary | ICD-10-CM

## 2022-01-12 DIAGNOSIS — Z5189 Encounter for other specified aftercare: Secondary | ICD-10-CM | POA: Diagnosis not present

## 2022-01-12 DIAGNOSIS — Z1502 Genetic susceptibility to malignant neoplasm of ovary: Secondary | ICD-10-CM | POA: Diagnosis not present

## 2022-01-12 DIAGNOSIS — Z1509 Genetic susceptibility to other malignant neoplasm: Secondary | ICD-10-CM | POA: Diagnosis not present

## 2022-01-12 DIAGNOSIS — R519 Headache, unspecified: Secondary | ICD-10-CM | POA: Diagnosis not present

## 2022-01-12 DIAGNOSIS — Z1501 Genetic susceptibility to malignant neoplasm of breast: Secondary | ICD-10-CM | POA: Diagnosis not present

## 2022-01-12 DIAGNOSIS — Z853 Personal history of malignant neoplasm of breast: Secondary | ICD-10-CM | POA: Diagnosis not present

## 2022-01-12 LAB — CMP (CANCER CENTER ONLY)
ALT: 15 U/L (ref 0–44)
AST: 19 U/L (ref 15–41)
Albumin: 4.2 g/dL (ref 3.5–5.0)
Alkaline Phosphatase: 71 U/L (ref 38–126)
Anion gap: 7 (ref 5–15)
BUN: 20 mg/dL (ref 8–23)
CO2: 28 mmol/L (ref 22–32)
Calcium: 10.4 mg/dL — ABNORMAL HIGH (ref 8.9–10.3)
Chloride: 103 mmol/L (ref 98–111)
Creatinine: 0.6 mg/dL (ref 0.44–1.00)
GFR, Estimated: >60 mL/min (ref >60)
Glucose, Bld: 113 mg/dL — ABNORMAL HIGH (ref 70–99)
Potassium: 4.3 mmol/L (ref 3.5–5.1)
Sodium: 138 mmol/L (ref 135–145)
Total Bilirubin: 0.5 mg/dL (ref 0.3–1.2)
Total Protein: 6.7 g/dL (ref 6.5–8.1)

## 2022-01-12 LAB — CBC WITH DIFFERENTIAL (CANCER CENTER ONLY)
Abs Immature Granulocytes: 0.02 10*3/uL (ref 0.00–0.07)
Basophils Absolute: 0 10*3/uL (ref 0.0–0.1)
Basophils Relative: 1 %
Eosinophils Absolute: 0.1 10*3/uL (ref 0.0–0.5)
Eosinophils Relative: 2 %
HCT: 31.5 % — ABNORMAL LOW (ref 36.0–46.0)
Hemoglobin: 10.2 g/dL — ABNORMAL LOW (ref 12.0–15.0)
Immature Granulocytes: 0 %
Lymphocytes Relative: 22 %
Lymphs Abs: 1.1 10*3/uL (ref 0.7–4.0)
MCH: 34.8 pg — ABNORMAL HIGH (ref 26.0–34.0)
MCHC: 32.4 g/dL (ref 30.0–36.0)
MCV: 107.5 fL — ABNORMAL HIGH (ref 80.0–100.0)
Monocytes Absolute: 0.8 10*3/uL (ref 0.1–1.0)
Monocytes Relative: 16 %
Neutro Abs: 3 10*3/uL (ref 1.7–7.7)
Neutrophils Relative %: 59 %
Platelet Count: 301 10*3/uL (ref 150–400)
RBC: 2.93 MIL/uL — ABNORMAL LOW (ref 3.87–5.11)
RDW: 17.1 % — ABNORMAL HIGH (ref 11.5–15.5)
WBC Count: 5.1 10*3/uL (ref 4.0–10.5)
nRBC: 0 % (ref 0.0–0.2)

## 2022-01-12 LAB — MAGNESIUM: Magnesium: 2 mg/dL (ref 1.7–2.4)

## 2022-01-12 NOTE — Progress Notes (Signed)
Patient seen by Ned Card NP today  Vitals are within treatment parameters.  Labs reviewed by Ned Card NP and are within treatment parameters.  Per physician team, patient is ready for treatment and there are NO modifications to the treatment plan. Treatment on 01/13/22

## 2022-01-12 NOTE — Progress Notes (Signed)
[facility] OFFICE PROGRESS NOTE   Diagnosis: Peritoneal carcinoma  INTERVAL HISTORY:   Ms. Enlow returns as scheduled.  She completed cycle 3 Taxol/carboplatin 12/24/2021.  She denies nausea/vomiting.  No mouth sores.  No diarrhea.  Main complaints are fatigue, lack of appetite.  She has lost weight since the last office visit.  She reports consistent arthralgias between treatments, especially right hip.  No numbness or tingling in the hands or feet.  She has a daily a.m. headache.  She is taking 3-4 doses of Tylenol a day.  Objective:  Vital signs in last 24 hours:  Blood pressure (!) 157/79, pulse 100, temperature 98.2 F (36.8 C), temperature source Oral, resp. rate 18, height _0  (1.499 m), weight 120 lb (54.4 kg), SpO2 98 %.    HEENT: No thrush or ulcers. Resp: Lungs clear bilaterally. Cardio: Regular rate and rhythm. GI: Right abdomen ileostomy.  No hepatosplenomegaly.   Vascular: No leg edema. Musculoskeletal: Nontender over the right hip.  Skin: No rash. Port-A-Cath without erythema.  Lab Results:  Lab Results  Component Value Date   WBC 5.1 01/12/2022   HGB 10.2 (L) 01/12/2022   HCT 31.5 (L) 01/12/2022   MCV 107.5 (H) 01/12/2022   PLT 301 01/12/2022   NEUTROABS 3.0 01/12/2022    Imaging:  No results found.  Medications: I have reviewed the patient's current medications.  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  Bone lesions concerning for metastases, right lung nodule Laparoscopy, diverting loop ileostomy, gastrostomy tube placement, peritoneal biopsy 10/16/2021, no evidence of a primary tumor site at the appendix, ovaries, or uterus.  Diffuse  peritoneal carcinomatosis Pathology of the lesser curvature of stomach carcinomatosis biopsy-metastatic poorly differentiated carcinoma, CK7, PAX8, WT1, and p53 positive with focal labeling for p16.  CDX2, CK20, and GATA3 negative.  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 Foundation 1-MSS, tumor mutation burden 5, HRD positive-LOH score 34.1% 08/11/2021 CA125 818 CT chest 11/03/2021-10 mm right lower lobe nodule, scattered tiny pulmonary nodules, peritoneal carcinomatosis Cycle 1 Taxol/carboplatin 11/12/2021 Cycle 2 Taxol/carboplatin 12/03/2021 Cycle 3 Taxol/carboplatin 12/24/2021 Bone scan 01/05/2022-negative for metastatic disease Cycle 4 Taxol/carboplatin 01/13/2022 2.   Abdominal distention/pain and diarrhea secondary to #1 3.   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  Lumpectomy and adjuvant CMF x8 followed by radiation 4. Hypertension 5. Hypothyroidism 6. Barrett's esophagus 7. Family history of multiple cancers including appendix, colon, and lung cancer 8.  Hospital admission 11/05/2021 with dehydration/prerenal azotemia secondary to nausea and high output ileostomy 9.  Genetic testing-heterozygote for pathogenic mutations in the MUTYH and CF genes    Disposition: Ms. Krawiec appears stable.  She has completed 3 cycles of Taxol/carboplatin.  Overall she seems to be tolerating chemotherapy well.  Plan to proceed with cycle 4 as scheduled 01/13/2022.  We will follow-up on the CA125 from today.  She has an appointment with Dr. Berline Lopes on 01/26/2022.  CBC and chemistry panel reviewed.  Labs adequate to proceed as above.   The right hip pain and headaches are not likely related to treatment.  She will decrease use of Tylenol.  She will return for lab, follow-up, possible chemotherapy in 3 weeks.  We are available to see  her sooner if needed.  Patient seen with Dr.  Benay Spice.  Ned Card ANP/GNP-BC   01/12/2022  10:53 AM This was a shared visit with Ned Card.  Mr. Clydene Laming was interviewed and examined.  Her overall status appears stable.  She will complete another cycle of Taxol/carboplatin today.  We will coordinate scheduling a restaging CTs with Dr. Berline Lopes.  The etiology of her headaches is unclear.  A bone scan was negative for metastases.  I was present for greater than 50% today's visit.  I performed medical decision making.  Julieanne Manson, MD

## 2022-01-13 ENCOUNTER — Other Ambulatory Visit: Payer: Medicare HMO

## 2022-01-13 ENCOUNTER — Ambulatory Visit: Payer: Medicare HMO | Admitting: Nurse Practitioner

## 2022-01-13 ENCOUNTER — Inpatient Hospital Stay: Payer: Medicare HMO

## 2022-01-13 ENCOUNTER — Ambulatory Visit: Payer: Medicare HMO

## 2022-01-13 VITALS — BP 150/76 | HR 99 | Temp 98.1°F | Resp 18

## 2022-01-13 DIAGNOSIS — Z1509 Genetic susceptibility to other malignant neoplasm: Secondary | ICD-10-CM | POA: Diagnosis not present

## 2022-01-13 DIAGNOSIS — Z853 Personal history of malignant neoplasm of breast: Secondary | ICD-10-CM | POA: Diagnosis not present

## 2022-01-13 DIAGNOSIS — R630 Anorexia: Secondary | ICD-10-CM | POA: Diagnosis not present

## 2022-01-13 DIAGNOSIS — Z1501 Genetic susceptibility to malignant neoplasm of breast: Secondary | ICD-10-CM | POA: Diagnosis not present

## 2022-01-13 DIAGNOSIS — R519 Headache, unspecified: Secondary | ICD-10-CM | POA: Diagnosis not present

## 2022-01-13 DIAGNOSIS — M79659 Pain in unspecified thigh: Secondary | ICD-10-CM | POA: Diagnosis not present

## 2022-01-13 DIAGNOSIS — Z931 Gastrostomy status: Secondary | ICD-10-CM | POA: Diagnosis not present

## 2022-01-13 DIAGNOSIS — C481 Malignant neoplasm of specified parts of peritoneum: Secondary | ICD-10-CM

## 2022-01-13 DIAGNOSIS — Z1502 Genetic susceptibility to malignant neoplasm of ovary: Secondary | ICD-10-CM | POA: Diagnosis not present

## 2022-01-13 DIAGNOSIS — Z5189 Encounter for other specified aftercare: Secondary | ICD-10-CM | POA: Diagnosis not present

## 2022-01-13 DIAGNOSIS — C482 Malignant neoplasm of peritoneum, unspecified: Secondary | ICD-10-CM | POA: Diagnosis not present

## 2022-01-13 DIAGNOSIS — Z23 Encounter for immunization: Secondary | ICD-10-CM | POA: Diagnosis not present

## 2022-01-13 DIAGNOSIS — R5383 Other fatigue: Secondary | ICD-10-CM | POA: Diagnosis not present

## 2022-01-13 DIAGNOSIS — Z5111 Encounter for antineoplastic chemotherapy: Secondary | ICD-10-CM | POA: Diagnosis not present

## 2022-01-13 LAB — CA 125: Cancer Antigen (CA) 125: 46.9 U/mL — ABNORMAL HIGH (ref 0.0–38.1)

## 2022-01-13 MED ORDER — SODIUM CHLORIDE 0.9 % IV SOLN
175.0000 mg/m2 | Freq: Once | INTRAVENOUS | Status: AC
  Administered 2022-01-13: 258 mg via INTRAVENOUS
  Filled 2022-01-13: qty 43

## 2022-01-13 MED ORDER — SODIUM CHLORIDE 0.9 % IV SOLN
10.0000 mg | Freq: Once | INTRAVENOUS | Status: AC
  Administered 2022-01-13: 10 mg via INTRAVENOUS
  Filled 2022-01-13: qty 1

## 2022-01-13 MED ORDER — HEPARIN SOD (PORK) LOCK FLUSH 100 UNIT/ML IV SOLN
500.0000 [IU] | Freq: Once | INTRAVENOUS | Status: AC | PRN
  Administered 2022-01-13: 500 [IU]

## 2022-01-13 MED ORDER — FAMOTIDINE IN NACL 20-0.9 MG/50ML-% IV SOLN
20.0000 mg | Freq: Once | INTRAVENOUS | Status: AC
  Administered 2022-01-13: 20 mg via INTRAVENOUS
  Filled 2022-01-13: qty 50

## 2022-01-13 MED ORDER — SODIUM CHLORIDE 0.9 % IV SOLN
318.0000 mg | Freq: Once | INTRAVENOUS | Status: AC
  Administered 2022-01-13: 320 mg via INTRAVENOUS
  Filled 2022-01-13: qty 32

## 2022-01-13 MED ORDER — SODIUM CHLORIDE 0.9 % IV SOLN: Freq: Once | INTRAVENOUS | Status: AC

## 2022-01-13 MED ORDER — PALONOSETRON HCL INJECTION 0.25 MG/5ML
0.2500 mg | Freq: Once | INTRAVENOUS | Status: AC
  Administered 2022-01-13: 0.25 mg via INTRAVENOUS
  Filled 2022-01-13: qty 5

## 2022-01-13 MED ORDER — SODIUM CHLORIDE 0.9 % IV SOLN
150.0000 mg | Freq: Once | INTRAVENOUS | Status: AC
  Administered 2022-01-13: 150 mg via INTRAVENOUS
  Filled 2022-01-13: qty 5

## 2022-01-13 MED ORDER — DIPHENHYDRAMINE HCL 50 MG/ML IJ SOLN
25.0000 mg | Freq: Once | INTRAMUSCULAR | Status: AC
  Administered 2022-01-13: 25 mg via INTRAVENOUS
  Filled 2022-01-13: qty 1

## 2022-01-13 MED ORDER — SODIUM CHLORIDE 0.9% FLUSH
10.0000 mL | INTRAVENOUS | Status: DC | PRN
  Administered 2022-01-13: 10 mL

## 2022-01-13 NOTE — Patient Instructions (Signed)
[facility]   Discharge Instructions: Thank you for choosing [facility] to provide your oncology and hematology care.   If you have a lab appointment with the [facility], please go directly to the [facility] and check in at the registration area.   Wear comfortable clothing and clothing appropriate for easy access to any Portacath or PICC line.   We strive to give you quality time with your provider. You may need to reschedule your appointment if you arrive late (15 or more minutes).  Arriving late affects you and other patients whose appointments are after yours.  Also, if you miss three or more appointments without notifying the office, you may be dismissed from the clinic at the provider's discretion.      For prescription refill requests, have your pharmacy contact our office and allow 72 hours for refills to be completed.    Today you received the following chemotherapy and/or immunotherapy agents Paclitaxel (TAXOL) & Carboplatin (PARAPLATIN).      To help prevent nausea and vomiting after your treatment, we encourage you to take your nausea medication as directed.  BELOW ARE SYMPTOMS THAT SHOULD BE REPORTED IMMEDIATELY: *FEVER GREATER THAN 100.4 F (38 C) OR HIGHER *CHILLS OR SWEATING *NAUSEA AND VOMITING THAT IS NOT CONTROLLED WITH YOUR NAUSEA MEDICATION *UNUSUAL SHORTNESS OF BREATH *UNUSUAL BRUISING OR BLEEDING *URINARY PROBLEMS (pain or burning when urinating, or frequent urination) *BOWEL PROBLEMS (unusual diarrhea, constipation, pain near the anus) TENDERNESS IN MOUTH AND THROAT WITH OR WITHOUT PRESENCE OF ULCERS (sore throat, sores in mouth, or a toothache) UNUSUAL RASH, SWELLING OR PAIN  UNUSUAL VAGINAL DISCHARGE OR ITCHING   Items with * indicate a potential emergency and should be followed up as soon as possible or go to the Emergency Department if any problems should occur.  Please show the CHEMOTHERAPY ALERT CARD or  IMMUNOTHERAPY ALERT CARD at check-in to the Emergency Department and triage nurse.  Should you have questions after your visit or need to cancel or reschedule your appointment, please contact [facility]  Dept: [phone]  and follow the prompts.  Office hours are 8:00 a.m. to 4:30 p.m. Monday - Friday. Please note that voicemails left after 4:00 p.m. may not be returned until the following business day.  We are closed weekends and major holidays. You have access to a nurse at all times for urgent questions. Please call the main number to the clinic Dept: [phone] and follow the prompts.   For any non-urgent questions, you may also contact your provider using MyChart. We now offer e-Visits for anyone 84 and older to request care online for non-urgent symptoms. For details visit [email or web address].   Also download the MyChart app! Go to the app store, search "MyChart", open the app, select [facility], and log in with your MyChart username and password.  Masks are optional in the cancer centers. If you would like for your care team to wear a mask while they are taking care of you, please let them know. You may have one support person who is at least 79 years old accompany you for your appointments.  Paclitaxel Injection What is this medication? PACLITAXEL (PAK li TAX el) treats some types of cancer. It works by slowing down the growth of cancer cells. This medicine may be used for other purposes; ask your health care provider or pharmacist if you have questions. COMMON BRAND NAME(S): Onxol, Taxol What should I tell my care  team before I take this medication? They need to know if you have any of these conditions: Heart disease Liver disease Low white blood cell levels An unusual or allergic reaction to paclitaxel, other medications, foods, dyes, or preservatives If you or your partner are pregnant or trying to get pregnant Breast-feeding How should I use  this medication? This medication is injected into a vein. It is given by your care team in a hospital or clinic setting. Talk to your care team about the use of this medication in children. While it may be given to children for selected conditions, precautions do apply. Overdosage: If you think you have taken too much of this medicine contact a poison control center or emergency room at once. NOTE: This medicine is only for you. Do not share this medicine with others. What if I miss a dose? Keep appointments for follow-up doses. It is important not to miss your dose. Call your care team if you are unable to keep an appointment. What may interact with this medication? Do not take this medication with any of the following: Live virus vaccines Other medications may affect the way this medication works. Talk with your care team about all of the medications you take. They may suggest changes to your treatment plan to lower the risk of side effects and to make sure your medications work as intended. This list may not describe all possible interactions. Give your health care provider a list of all the medicines, herbs, non-prescription drugs, or dietary supplements you use. Also tell them if you smoke, drink alcohol, or use illegal drugs. Some items may interact with your medicine. What should I watch for while using this medication? Your condition will be monitored carefully while you are receiving this medication. You may need blood work while taking this medication. This medication may make you feel generally unwell. This is not uncommon as chemotherapy can affect healthy cells as well as cancer cells. Report any side effects. Continue your course of treatment even though you feel ill unless your care team tells you to stop. This medication can cause serious allergic reactions. To reduce the risk, your care team may give you other medications to take before receiving this one. Be sure to follow the  directions from your care team. This medication may increase your risk of getting an infection. Call your care team for advice if you get a fever, chills, sore throat, or other symptoms of a cold or flu. Do not treat yourself. Try to avoid being around people who are sick. This medication may increase your risk to bruise or bleed. Call your care team if you notice any unusual bleeding. Be careful brushing or flossing your teeth or using a toothpick because you may get an infection or bleed more easily. If you have any dental work done, tell your dentist you are receiving this medication. Talk to your care team if you may be pregnant. Serious birth defects can occur if you take this medication during pregnancy. Talk to your care team before breastfeeding. Changes to your treatment plan may be needed. What side effects may I notice from receiving this medication? Side effects that you should report to your care team as soon as possible: Allergic reactions--skin rash, itching, hives, swelling of the face, lips, tongue, or throat Heart rhythm changes--fast or irregular heartbeat, dizziness, feeling faint or lightheaded, chest pain, trouble breathing Increase in blood pressure Infection--fever, chills, cough, sore throat, wounds that don't heal, pain or trouble when   passing urine, general feeling of discomfort or being unwell Low blood pressure--dizziness, feeling faint or lightheaded, blurry vision Low red blood cell level--unusual weakness or fatigue, dizziness, headache, trouble breathing Painful swelling, warmth, or redness of the skin, blisters or sores at the infusion site Pain, tingling, or numbness in the hands or feet Slow heartbeat--dizziness, feeling faint or lightheaded, confusion, trouble breathing, unusual weakness or fatigue Unusual bruising or bleeding Side effects that usually do not require medical attention (report to your care team if they continue or are bothersome): Diarrhea Hair  loss Joint pain Loss of appetite Muscle pain Nausea Vomiting This list may not describe all possible side effects. Call your doctor for medical advice about side effects. You may report side effects to FDA at 1-800-FDA-1088. Where should I keep my medication? This medication is given in a hospital or clinic. It will not be stored at home. NOTE: This sheet is a summary. It may not cover all possible information. If you have questions about this medicine, talk to your doctor, pharmacist, or health care provider.  2023 Elsevier/Gold Standard (2021-06-25 00:00:00)  Carboplatin Injection What is this medication? CARBOPLATIN (KAR boe pla tin) treats some types of cancer. It works by slowing down the growth of cancer cells. This medicine may be used for other purposes; ask your health care provider or pharmacist if you have questions. COMMON BRAND NAME(S): Paraplatin What should I tell my care team before I take this medication? They need to know if you have any of these conditions: Blood disorders Hearing problems Kidney disease Recent or ongoing radiation therapy An unusual or allergic reaction to carboplatin, cisplatin, other medications, foods, dyes, or preservatives Pregnant or trying to get pregnant Breast-feeding How should I use this medication? This medication is injected into a vein. It is given by your care team in a hospital or clinic setting. Talk to your care team about the use of this medication in children. Special care may be needed. Overdosage: If you think you have taken too much of this medicine contact a poison control center or emergency room at once. NOTE: This medicine is only for you. Do not share this medicine with others. What if I miss a dose? Keep appointments for follow-up doses. It is important not to miss your dose. Call your care team if you are unable to keep an appointment. What may interact with this medication? Medications for seizures Some  antibiotics, such as amikacin, gentamicin, neomycin, streptomycin, tobramycin Vaccines This list may not describe all possible interactions. Give your health care provider a list of all the medicines, herbs, non-prescription drugs, or dietary supplements you use. Also tell them if you smoke, drink alcohol, or use illegal drugs. Some items may interact with your medicine. What should I watch for while using this medication? Your condition will be monitored carefully while you are receiving this medication. You may need blood work while taking this medication. This medication may make you feel generally unwell. This is not uncommon, as chemotherapy can affect healthy cells as well as cancer cells. Report any side effects. Continue your course of treatment even though you feel ill unless your care team tells you to stop. In some cases, you may be given additional medications to help with side effects. Follow all directions for their use. This medication may increase your risk of getting an infection. Call your care team for advice if you get a fever, chills, sore throat, or other symptoms of a cold or flu. Do not treat  yourself. Try to avoid being around people who are sick. Avoid taking medications that contain aspirin, acetaminophen, ibuprofen, naproxen, or ketoprofen unless instructed by your care team. These medications may hide a fever. Be careful brushing or flossing your teeth or using a toothpick because you may get an infection or bleed more easily. If you have any dental work done, tell your dentist you are receiving this medication. Talk to your care team if you wish to become pregnant or think you might be pregnant. This medication can cause serious birth defects. Talk to your care team about effective forms of contraception. Do not breast-feed while taking this medication. What side effects may I notice from receiving this medication? Side effects that you should report to your care team as  soon as possible: Allergic reactions--skin rash, itching, hives, swelling of the face, lips, tongue, or throat Infection--fever, chills, cough, sore throat, wounds that don't heal, pain or trouble when passing urine, general feeling of discomfort or being unwell Low red blood cell level--unusual weakness or fatigue, dizziness, headache, trouble breathing Pain, tingling, or numbness in the hands or feet, muscle weakness, change in vision, confusion or trouble speaking, loss of balance or coordination, trouble walking, seizures Unusual bruising or bleeding Side effects that usually do not require medical attention (report to your care team if they continue or are bothersome): Hair loss Nausea Unusual weakness or fatigue Vomiting This list may not describe all possible side effects. Call your doctor for medical advice about side effects. You may report side effects to FDA at 1-800-FDA-1088. Where should I keep my medication? This medication is given in a hospital or clinic. It will not be stored at home. NOTE: This sheet is a summary. It may not cover all possible information. If you have questions about this medicine, talk to your doctor, pharmacist, or health care provider.  2023 Elsevier/Gold Standard (2021-06-02 00:00:00)

## 2022-01-15 ENCOUNTER — Inpatient Hospital Stay: Payer: Medicare HMO

## 2022-01-15 VITALS — BP 149/66 | HR 95 | Temp 98.1°F | Resp 18

## 2022-01-15 DIAGNOSIS — R519 Headache, unspecified: Secondary | ICD-10-CM | POA: Diagnosis not present

## 2022-01-15 DIAGNOSIS — Z5111 Encounter for antineoplastic chemotherapy: Secondary | ICD-10-CM | POA: Diagnosis not present

## 2022-01-15 DIAGNOSIS — Z5189 Encounter for other specified aftercare: Secondary | ICD-10-CM | POA: Diagnosis not present

## 2022-01-15 DIAGNOSIS — R630 Anorexia: Secondary | ICD-10-CM | POA: Diagnosis not present

## 2022-01-15 DIAGNOSIS — Z1502 Genetic susceptibility to malignant neoplasm of ovary: Secondary | ICD-10-CM | POA: Diagnosis not present

## 2022-01-15 DIAGNOSIS — C482 Malignant neoplasm of peritoneum, unspecified: Secondary | ICD-10-CM | POA: Diagnosis not present

## 2022-01-15 DIAGNOSIS — Z23 Encounter for immunization: Secondary | ICD-10-CM | POA: Diagnosis not present

## 2022-01-15 DIAGNOSIS — Z1501 Genetic susceptibility to malignant neoplasm of breast: Secondary | ICD-10-CM | POA: Diagnosis not present

## 2022-01-15 DIAGNOSIS — M79659 Pain in unspecified thigh: Secondary | ICD-10-CM | POA: Diagnosis not present

## 2022-01-15 DIAGNOSIS — Z853 Personal history of malignant neoplasm of breast: Secondary | ICD-10-CM | POA: Diagnosis not present

## 2022-01-15 DIAGNOSIS — C481 Malignant neoplasm of specified parts of peritoneum: Secondary | ICD-10-CM

## 2022-01-15 DIAGNOSIS — Z1509 Genetic susceptibility to other malignant neoplasm: Secondary | ICD-10-CM | POA: Diagnosis not present

## 2022-01-15 DIAGNOSIS — Z931 Gastrostomy status: Secondary | ICD-10-CM | POA: Diagnosis not present

## 2022-01-15 DIAGNOSIS — R5383 Other fatigue: Secondary | ICD-10-CM | POA: Diagnosis not present

## 2022-01-15 MED ORDER — PEGFILGRASTIM-JMDB 6 MG/0.6ML ~~LOC~~ SOSY
6.0000 mg | PREFILLED_SYRINGE | Freq: Once | SUBCUTANEOUS | Status: AC
  Administered 2022-01-15: 6 mg via SUBCUTANEOUS
  Filled 2022-01-15: qty 0.6

## 2022-01-16 DIAGNOSIS — Z87891 Personal history of nicotine dependence: Secondary | ICD-10-CM | POA: Diagnosis not present

## 2022-01-16 DIAGNOSIS — Z9221 Personal history of antineoplastic chemotherapy: Secondary | ICD-10-CM | POA: Diagnosis not present

## 2022-01-16 DIAGNOSIS — K219 Gastro-esophageal reflux disease without esophagitis: Secondary | ICD-10-CM | POA: Diagnosis not present

## 2022-01-16 DIAGNOSIS — C184 Malignant neoplasm of transverse colon: Secondary | ICD-10-CM | POA: Diagnosis not present

## 2022-01-16 DIAGNOSIS — I10 Essential (primary) hypertension: Secondary | ICD-10-CM | POA: Diagnosis not present

## 2022-01-16 DIAGNOSIS — E785 Hyperlipidemia, unspecified: Secondary | ICD-10-CM | POA: Diagnosis not present

## 2022-01-16 DIAGNOSIS — H538 Other visual disturbances: Secondary | ICD-10-CM | POA: Diagnosis not present

## 2022-01-16 DIAGNOSIS — Z7952 Long term (current) use of systemic steroids: Secondary | ICD-10-CM | POA: Diagnosis not present

## 2022-01-16 DIAGNOSIS — Z96653 Presence of artificial knee joint, bilateral: Secondary | ICD-10-CM | POA: Diagnosis not present

## 2022-01-16 DIAGNOSIS — H269 Unspecified cataract: Secondary | ICD-10-CM | POA: Diagnosis not present

## 2022-01-16 DIAGNOSIS — Z9181 History of falling: Secondary | ICD-10-CM | POA: Diagnosis not present

## 2022-01-16 DIAGNOSIS — R69 Illness, unspecified: Secondary | ICD-10-CM | POA: Diagnosis not present

## 2022-01-16 DIAGNOSIS — K2270 Barrett's esophagus without dysplasia: Secondary | ICD-10-CM | POA: Diagnosis not present

## 2022-01-16 DIAGNOSIS — Z853 Personal history of malignant neoplasm of breast: Secondary | ICD-10-CM | POA: Diagnosis not present

## 2022-01-16 DIAGNOSIS — C786 Secondary malignant neoplasm of retroperitoneum and peritoneum: Secondary | ICD-10-CM | POA: Diagnosis not present

## 2022-01-16 DIAGNOSIS — Z431 Encounter for attention to gastrostomy: Secondary | ICD-10-CM | POA: Diagnosis not present

## 2022-01-16 DIAGNOSIS — D539 Nutritional anemia, unspecified: Secondary | ICD-10-CM | POA: Diagnosis not present

## 2022-01-16 DIAGNOSIS — E46 Unspecified protein-calorie malnutrition: Secondary | ICD-10-CM | POA: Diagnosis not present

## 2022-01-16 DIAGNOSIS — M1990 Unspecified osteoarthritis, unspecified site: Secondary | ICD-10-CM | POA: Diagnosis not present

## 2022-01-16 DIAGNOSIS — Z432 Encounter for attention to ileostomy: Secondary | ICD-10-CM | POA: Diagnosis not present

## 2022-01-16 DIAGNOSIS — E0500 Thyrotoxicosis with diffuse goiter without thyrotoxic crisis or storm: Secondary | ICD-10-CM | POA: Diagnosis not present

## 2022-01-18 ENCOUNTER — Other Ambulatory Visit: Payer: Self-pay | Admitting: Nurse Practitioner

## 2022-01-18 DIAGNOSIS — C481 Malignant neoplasm of specified parts of peritoneum: Secondary | ICD-10-CM

## 2022-01-19 ENCOUNTER — Telehealth: Payer: Self-pay

## 2022-01-19 NOTE — Telephone Encounter (Signed)
-----   Message from Owens Shark, NP sent at 01/18/2022  4:30 PM EST ----- Please let her know I communicated with Dr. Charisse March office.  They would like for her to have CT scans prior to office visit.  I have entered the orders.

## 2022-01-19 NOTE — Telephone Encounter (Signed)
Patient is schedule for CT scan on 01/22/22 pending authorization

## 2022-01-21 ENCOUNTER — Other Ambulatory Visit: Payer: Self-pay

## 2022-01-22 ENCOUNTER — Ambulatory Visit (HOSPITAL_BASED_OUTPATIENT_CLINIC_OR_DEPARTMENT_OTHER)
Admission: RE | Admit: 2022-01-22 | Discharge: 2022-01-22 | Disposition: A | Discharge status: Home / Self Care | Payer: Medicare HMO | Source: Ambulatory Visit | Attending: Nurse Practitioner | Admitting: Nurse Practitioner

## 2022-01-22 ENCOUNTER — Encounter: Payer: Medicare HMO | Admitting: Gynecologic Oncology

## 2022-01-22 DIAGNOSIS — Z432 Encounter for attention to ileostomy: Secondary | ICD-10-CM | POA: Diagnosis not present

## 2022-01-22 DIAGNOSIS — E46 Unspecified protein-calorie malnutrition: Secondary | ICD-10-CM | POA: Diagnosis not present

## 2022-01-22 DIAGNOSIS — K219 Gastro-esophageal reflux disease without esophagitis: Secondary | ICD-10-CM | POA: Diagnosis not present

## 2022-01-22 DIAGNOSIS — M1990 Unspecified osteoarthritis, unspecified site: Secondary | ICD-10-CM | POA: Diagnosis not present

## 2022-01-22 DIAGNOSIS — Z9181 History of falling: Secondary | ICD-10-CM | POA: Diagnosis not present

## 2022-01-22 DIAGNOSIS — E0500 Thyrotoxicosis with diffuse goiter without thyrotoxic crisis or storm: Secondary | ICD-10-CM | POA: Diagnosis not present

## 2022-01-22 DIAGNOSIS — H538 Other visual disturbances: Secondary | ICD-10-CM | POA: Diagnosis not present

## 2022-01-22 DIAGNOSIS — C786 Secondary malignant neoplasm of retroperitoneum and peritoneum: Secondary | ICD-10-CM | POA: Diagnosis not present

## 2022-01-22 DIAGNOSIS — R69 Illness, unspecified: Secondary | ICD-10-CM | POA: Diagnosis not present

## 2022-01-22 DIAGNOSIS — R918 Other nonspecific abnormal finding of lung field: Secondary | ICD-10-CM | POA: Diagnosis not present

## 2022-01-22 DIAGNOSIS — N200 Calculus of kidney: Secondary | ICD-10-CM | POA: Diagnosis not present

## 2022-01-22 DIAGNOSIS — Z96653 Presence of artificial knee joint, bilateral: Secondary | ICD-10-CM | POA: Diagnosis not present

## 2022-01-22 DIAGNOSIS — Z87891 Personal history of nicotine dependence: Secondary | ICD-10-CM | POA: Diagnosis not present

## 2022-01-22 DIAGNOSIS — Z7952 Long term (current) use of systemic steroids: Secondary | ICD-10-CM | POA: Diagnosis not present

## 2022-01-22 DIAGNOSIS — C481 Malignant neoplasm of specified parts of peritoneum: Secondary | ICD-10-CM | POA: Insufficient documentation

## 2022-01-22 DIAGNOSIS — C801 Malignant (primary) neoplasm, unspecified: Secondary | ICD-10-CM | POA: Diagnosis not present

## 2022-01-22 DIAGNOSIS — Z9221 Personal history of antineoplastic chemotherapy: Secondary | ICD-10-CM | POA: Diagnosis not present

## 2022-01-22 DIAGNOSIS — D539 Nutritional anemia, unspecified: Secondary | ICD-10-CM | POA: Diagnosis not present

## 2022-01-22 DIAGNOSIS — I10 Essential (primary) hypertension: Secondary | ICD-10-CM | POA: Diagnosis not present

## 2022-01-22 DIAGNOSIS — H269 Unspecified cataract: Secondary | ICD-10-CM | POA: Diagnosis not present

## 2022-01-22 DIAGNOSIS — Z431 Encounter for attention to gastrostomy: Secondary | ICD-10-CM | POA: Diagnosis not present

## 2022-01-22 DIAGNOSIS — C184 Malignant neoplasm of transverse colon: Secondary | ICD-10-CM | POA: Diagnosis not present

## 2022-01-22 DIAGNOSIS — K2270 Barrett's esophagus without dysplasia: Secondary | ICD-10-CM | POA: Diagnosis not present

## 2022-01-22 DIAGNOSIS — Z853 Personal history of malignant neoplasm of breast: Secondary | ICD-10-CM | POA: Diagnosis not present

## 2022-01-22 DIAGNOSIS — E785 Hyperlipidemia, unspecified: Secondary | ICD-10-CM | POA: Diagnosis not present

## 2022-01-22 MED ORDER — IOHEXOL 300 MG/ML  SOLN
100.0000 mL | Freq: Once | INTRAMUSCULAR | Status: AC | PRN
  Administered 2022-01-22: 80 mL via INTRAVENOUS

## 2022-01-26 ENCOUNTER — Inpatient Hospital Stay: Payer: Medicare HMO | Attending: Oncology | Admitting: Gynecologic Oncology

## 2022-01-26 ENCOUNTER — Encounter: Payer: Self-pay | Admitting: Gynecologic Oncology

## 2022-01-26 ENCOUNTER — Other Ambulatory Visit: Payer: Self-pay

## 2022-01-26 ENCOUNTER — Inpatient Hospital Stay: Payer: Medicare HMO | Admitting: Gynecologic Oncology

## 2022-01-26 VITALS — BP 158/93 | HR 101 | Resp 16 | Wt 120.4 lb

## 2022-01-26 DIAGNOSIS — R911 Solitary pulmonary nodule: Secondary | ICD-10-CM | POA: Diagnosis not present

## 2022-01-26 DIAGNOSIS — R634 Abnormal weight loss: Secondary | ICD-10-CM

## 2022-01-26 DIAGNOSIS — Z932 Ileostomy status: Secondary | ICD-10-CM | POA: Diagnosis not present

## 2022-01-26 DIAGNOSIS — Z5111 Encounter for antineoplastic chemotherapy: Secondary | ICD-10-CM | POA: Diagnosis not present

## 2022-01-26 DIAGNOSIS — C7889 Secondary malignant neoplasm of other digestive organs: Secondary | ICD-10-CM | POA: Diagnosis not present

## 2022-01-26 DIAGNOSIS — C785 Secondary malignant neoplasm of large intestine and rectum: Secondary | ICD-10-CM | POA: Insufficient documentation

## 2022-01-26 DIAGNOSIS — K5730 Diverticulosis of large intestine without perforation or abscess without bleeding: Secondary | ICD-10-CM | POA: Diagnosis not present

## 2022-01-26 DIAGNOSIS — Z5189 Encounter for other specified aftercare: Secondary | ICD-10-CM | POA: Diagnosis not present

## 2022-01-26 DIAGNOSIS — G893 Neoplasm related pain (acute) (chronic): Secondary | ICD-10-CM | POA: Insufficient documentation

## 2022-01-26 DIAGNOSIS — R64 Cachexia: Secondary | ICD-10-CM | POA: Diagnosis not present

## 2022-01-26 DIAGNOSIS — C579 Malignant neoplasm of female genital organ, unspecified: Secondary | ICD-10-CM | POA: Diagnosis not present

## 2022-01-26 DIAGNOSIS — C482 Malignant neoplasm of peritoneum, unspecified: Secondary | ICD-10-CM | POA: Diagnosis not present

## 2022-01-26 DIAGNOSIS — I700 Atherosclerosis of aorta: Secondary | ICD-10-CM | POA: Insufficient documentation

## 2022-01-26 DIAGNOSIS — M4856XA Collapsed vertebra, not elsewhere classified, lumbar region, initial encounter for fracture: Secondary | ICD-10-CM | POA: Diagnosis not present

## 2022-01-26 DIAGNOSIS — C786 Secondary malignant neoplasm of retroperitoneum and peritoneum: Secondary | ICD-10-CM

## 2022-01-26 NOTE — Patient Instructions (Signed)
It was good to see you this week. I will call you later in the week to check in based on our discussion today.

## 2022-01-26 NOTE — Progress Notes (Signed)
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 This causes severe obstruction of the more proximal transverse and right colon as well as the distal small bowel. Mild periaortic adenopathy is noted consistent with metastatic disease. Multiple large peritoneal implants are noted, particularly in the left side of the abdomen as well as omental caking, most consistent with peritoneal carcinomatosis. Multiple rounded lucencies are noted in the visualized femurs concerning for metastatic disease. These results will be called to the ordering clinician or representative by the Radiologist Assistant, and communication documented in the PACS or zVision Dashboard.   13 x 11 mm nodule is noted in right lower lobe concerning for metastatic disease.   Small nonobstructive left renal calculus.   10/16/2021 Surgery   LAPAROSCOPIC  DIVERTING LOOP ILEOSTOMY LAPAROSCOPIC GASTROSTOMY TUBE PLACEMENT  With Dr. Johney Maine  OR FINDINGS:   Diffuse carcinomatosis on parietal and visceral peritoneum in all 4 quadrants and down on pelvis.     Epicenter of bulky cratered white fibrotic large mass at distal transverse colon.   Appendix normal with secondary few dots of carcinomatosis.     Bilateral adnexa (fallopian tube and ovaries) and uterus postmenopausal atrophic with no mass tumor or nodularity.     Stomach with some nodules along the lesser sac and greater omentum but not particularly on the stomach itself argue against gastric etiology.     Liver without any obvious major masses.     Gallbladder with secondary coverage as well.   57 French MIC key laparoscopically placed  in distal antrum that could reach below the left costal margin after some dissection.  Secured with PDS internally and Prolene externally.  10 mm balloon filled with water   Small bowel with dots of carcinomatosis but no obvious obstruction there.  Distal ileal diverting loop ileostomy placed.  Proximal end is superior/cephalad.  Distal end is inferior at "6:00".  Large red Robinson catheter placed in distal end of ileostomy to decompress a large volume of flatus c/w retrograde proximal right colon decompression & improvement in distention.   10/16/2021 Pathology Results   Cytology: malignant cells present  A.  CARCINOMATOSIS, LESSER CURVATURE OF STOMACH, EXCISION:  - Metastatic poorly differentiated carcinoma, see comment   COMMENT:   Immunohistochemical stains show that the tumor cells are positive for  CK7, PAX8, WT1 and p53 (clonal overexpression pattern) with focal  labeling for p16.  Immunostains for CDX2, CK20, and GATA3 are negative.  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  Differential diagnosis can include a primary  peritoneal serous carcinoma.  Clinical and radiologic correlation is  suggested.  Dr. Arby Barrette reviewed the case and concurs with the above  diagnosis.  Dr. Johney Maine was notified on 10/21/2021.    11/03/2021 Imaging   CT chest; 1. 10 mm right lower lobe nodule, stable, with a few additional scattered tiny pulmonary nodules. Metastatic disease cannot be excluded. Recommend continued attention on follow-up. If a more aggressive approach is desired, PET may be helpful in further assessment of the larger right lower lobe nodule. 2. 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 3. Left renal stone. 4. Aortic atherosclerosis (ICD10-I70.0).   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  The report date is 12/03/2021.  The Common Hereditary Gene Panel offered by [facility] includes sequencing and/or deletion duplication testing of the following 48 genes: APC, ATM, AXIN2, BARD1, BMPR1A, BRCA1, BRCA2, BRIP1, CDH1, CDK4, CDKN2A (p14ARF), CDKN2A (p16INK4a), CFTR, CHEK2, CTNNA1, DICER1, EPCAM (Deletion/duplication testing only), GREM1 (promoter region deletion/duplication testing only), KIT, MEN1, MLH1, MSH2, MSH3, MSH6, MUTYH, NBN, NF1, NHTL1, PALB2, PDGFRA, PMS2, POLD1, POLE, PTEN, RAD50, RAD51C, RAD51D, SDHB, SDHC, SDHD, SMAD4, SMARCA4. STK11, TP53, TSC1, TSC2, and VHL.  The following genes were evaluated for sequence changes only: SDHA and HOXB13 c.251G>A variant only.    01/07/2022 Imaging   Bone scan: Negative for osseous metastatic disease.    01/22/2022 Imaging   CT C/A/P: IMPRESSION: 1. Significant interval positive response to therapy with significant reduction in burden of peritoneal carcinomatosis as detailed. No new or progressive metastatic disease. 2. Bilateral lower lobe pulmonary nodules are stable. 3. Stable indeterminate small left adrenal nodule. 4. Nonobstructing left nephrolithiasis. 5. Chronic moderate L1 vertebral compression fracture. 6. Chronic moderate sigmoid diverticulosis. 7.  Aortic Atherosclerosis (ICD10-I70.0).     Interval History: The patient presents for follow-up with her husband today.  She notes overall doing well.  She is using Tylenol almost around-the-clock secondary to bilateral hip pain.  She denies any neuropathy related to chemotherapy.  She endorses  normal function from her ostomy.  She denies any urinary symptoms.  Appetite is mildly improved, eating mostly liquids and soft food.  Trying to consume nutritional supplements daily.  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   Cataract    Family history of colon cancer    Gastrostomy in place [facility])    LUQ - placed 10/16/2021   Hypertension    Ileostomy, has currently ([place])    placed 09/2021   SUI (stress urinary incontinence, female)    Thyroid disease    Urinary urgency     Past Surgical History:  Procedure Laterality Date   BREAST LUMPECTOMY  1999   Chemo and radiation on right breast   IR IMAGING GUIDED PORT INSERTION  11/11/2021   LAPAROSCOPY N/A 10/16/2021   Procedure: LAPAROSCOPIC  DIVERTING OSTOMY, BIOSPY,  G-TUBE PLACEMENT;  Surgeon: Michael Boston, MD;  Location: [facility];  Service: General;  Laterality: N/A;   [place] & 2001    Family History  Problem Relation Age of Onset   Colon cancer Mother 38   Lung cancer Sister    Cancer Maternal Aunt        NOS   Cancer Daughter        appendiceal   Stomach cancer Neg Hx    Rectal cancer Neg Hx    Esophageal cancer Neg Hx     Social History   Socioeconomic  History   Marital status: Married    Spouse name: Not on file   Number of children: Not on file   Years of education: Not on file   Highest education level: Not on file  Occupational History   Not on file  Tobacco Use   Smoking status: Former    Types: Cigarettes    Quit date: 02/23/1972    Years since quitting: 49.9   Smokeless tobacco: Never  Substance and Sexual Activity   Alcohol use: Yes    Alcohol/week: 8.0 standard drinks of alcohol    Types: 8 Glasses of wine per week   Drug use: No   Sexual activity: Not on file   Other Topics Concern   Not on file  Social History Narrative   Not on file   Social Determinants of Health   Financial Resource Strain: Low Risk  (11/11/2021)   Overall Financial Resource Strain (CARDIA)    Difficulty of Paying Living Expenses: Not hard at all  Food Insecurity: No Food Insecurity (11/11/2021)   Hunger Vital Sign    Worried About Running Out of Food in the Last Year: Never true    Ran Out of Food in the Last Year: Never true  Transportation Needs: No Transportation Needs (11/11/2021)   PRAPARE - Hydrologist (Medical): No    Lack of Transportation (Non-Medical): No  Physical Activity: Not on file  Stress: Not on file  Social Connections: Not on file    Current Medications:  Current Outpatient Medications:    acetaminophen (TYLENOL) 325 MG tablet, Take 2 tablets (650 mg total) by mouth every 6 (six) hours as needed for mild pain or fever., Disp: , Rfl:    COVID-19 mRNA vaccine 2023-2024 (COMIRNATY) syringe, Inject into the muscle. (Patient not taking: Reported on 01/12/2022), Disp: 0.3 mL, Rfl: 0   diphenoxylate-atropine (LOMOTIL) 2.5-0.025 MG tablet, Take 1 tablet by mouth every 12 (twelve) hours as needed for diarrhea or loose stools., Disp: 60 tablet, Rfl: 1   loperamide (IMODIUM A-D) 2 MG tablet, Take 2 tablets (4 mg total) by mouth 4 (four) times daily as needed for diarrhea or loose stools., Disp: 60 tablet, Rfl: 2   loratadine (CLARITIN) 10 MG tablet, Take 10 mg by mouth 2 (two) times daily., Disp: , Rfl:    magnesium oxide (MAG-OX) 400 (240 Mg) MG tablet, Take 1 tablet (400 mg total) by mouth 2 (two) times daily., Disp: 60 tablet, Rfl: 3   MELATONIN CR PO, Take 1 tablet by mouth at bedtime., Disp: , Rfl:    methocarbamol (ROBAXIN) 500 MG tablet, Take 500 mg by mouth every 6 (six) hours as needed for muscle spasms. (Patient not taking: Reported on 12/24/2021), Disp: , Rfl:    Multiple Vitamin (MULTIVITAMIN PO), Take 1 tablet by mouth  daily with breakfast., Disp: , Rfl:    ondansetron (ZOFRAN) 8 MG tablet, Take 1 tablet (8 mg total) by mouth every 8 (eight) hours as needed for nausea or vomiting (Start using 3 days after chemo as needed for nausea/vomiting). (Patient not taking: Reported on 12/24/2021), Disp: 20 tablet, Rfl: 2   potassium chloride (MICRO-K) 10 MEQ CR capsule, Take 1 capsule (10 mEq total) by mouth 2 (two) times daily., Disp: 60 capsule, Rfl: 1   prochlorperazine (COMPAZINE) 10 MG tablet, Take 1 tablet (10 mg total) by mouth every 6 (six) hours as needed for nausea or vomiting. (Patient not taking: Reported on 11/23/2021), Disp: 30 tablet, Rfl: 0  Current Facility-Administered Medications:    0.9 %  sodium chloride infusion, 500 mL, Intravenous, Continuous, Nandigam, Kavitha V, MD  Facility-Administered Medications Ordered in Other Visits:    0.9 %  sodium chloride infusion, , Intravenous, Continuous, Ned Card K, NP   0.9 %  sodium chloride infusion, , Intravenous, Once, Benay Spice Izola Price, MD   dexamethasone (DECADRON) 10 mg in sodium chloride 0.9 % 50 mL IVPB, 10 mg, Intravenous, Once, Ladell Pier, MD  Review of Systems: + Shortness of breath, leg swelling, joint pain, back pain, rash. Denies appetite changes, fevers, chills, fatigue, unexplained weight changes. Denies hearing loss, neck lumps or masses, mouth sores, ringing in ears or voice changes. Denies cough or wheezing.   Denies chest pain or palpitations.  Denies abdominal distention, pain, blood in stools, constipation, diarrhea, nausea, vomiting, or early satiety. Denies pain with intercourse, dysuria, frequency, hematuria or incontinence. Denies hot flashes, pelvic pain, vaginal bleeding or vaginal discharge.   Denies muscle pain/cramps. Denies itching or wounds. Denies dizziness, headaches, numbness or seizures. Denies swollen lymph nodes or glands, denies easy bruising or bleeding. Denies anxiety, depression, confusion, or decreased  concentration.  Physical Exam: BP (!) 158/93 (BP Location: Right Arm, Patient Position: Sitting)   Pulse (!) 101   Resp 16   Wt 120 lb 7 oz (54.6 kg)   SpO2 100%   BMI 24.33 kg/m  General: Alert, oriented, no acute distress. HEENT: Normocephalic, atraumatic, sclera anicteric. Chest: Clear to auscultation bilaterally.  No wheezes or rhonchi. Cardiovascular: Heart rate in the high 90s, regular rhythm, no murmurs. Abdomen: soft, nontender.  Normoactive bowel sounds.  No masses or hepatosplenomegaly appreciated.  Right mid abdominal ostomy site with ostomy bag in place, no surrounding erythema. Extremities: Grossly normal range of motion.  Warm, well perfused.  No edema bilaterally.  Laboratory & Radiologic Studies: Component Ref Range & Units 2 wk ago 1 mo ago 2 mo ago 3 mo ago  Cancer Antigen (CA) 125 0.0 - 38.1 U/mL 46.9 High  96.3 High  CM 738.0 High  CM 818.0 High  C   Assessment & Plan: Nancy Harrison is a 81 y.o. 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  Patient overall is doing well.  Albumin has been normal although she has lost weight during treatment and looks fairly cachectic.  She has also had some physical deconditioning.  I reviewed in detail with the patient and her husband recent imaging findings.  We looked at these images together in comparison to her scans from August.  She has had almost normalization of her CA125 and decrease in tumor burden on CT scan.  I am somewhat concerned, though, given the disease distribution.  While the tumor burden near her spleen and transverse colon has significantly decreased, the tumor burden that appears to be within the mesentery of the jejunum and duodenum has decreased less.  I worry that this disease may not be resectable and even if it were, would result in losing significant amount of small bowel.  In terms of options moving  forward, I discussed the possibility of attempting debulking surgery at this time despite my above-mentioned concerns.  We discussed the very real possibility of aborting the procedure given intraoperative findings or a suboptimal debulking.  I also discussed the possibility of continuing with neoadjuvant chemotherapy with the plan to repeat imaging after 6 cycles.  Today, the patient voices that she is  not interested in a large morbid surgery and her preference would be to continue with chemotherapy at this time.  The couples daughter was treated at [facility] for a rare type of appendiceal-like cancer.  Mr. Foisy very much would like the patient to see Dr. Clovis Riley there.  Given her gynecologic malignancy diagnosis, I offered to call Dr. Alvester Chou, one of the GYN oncologist at [facility], regarding referral to her as a first step.  The couple is aware of the significant undertaking that HIPEC involves, but the husband would like to pursue the possibility of HIPEC if/when the patient undergoes debulking surgery.  I will also reach out to Dr. Benay Spice to review our discussion from today.  50 minutes of total time was spent for this patient encounter, including preparation, face-to-face counseling with the patient and coordination of care, and documentation of the encounter.  Jeral Pinch, MD  Division of Gynecologic Oncology  Department of Obstetrics and Gynecology  [facility]

## 2022-01-27 ENCOUNTER — Telehealth: Payer: Self-pay | Admitting: *Deleted

## 2022-01-27 NOTE — Telephone Encounter (Signed)
Per Dr Berline Lopes fax referral and records to Dr Johnnye Sima office

## 2022-01-28 ENCOUNTER — Encounter: Payer: Self-pay | Admitting: Gynecologic Oncology

## 2022-01-28 DIAGNOSIS — Z87891 Personal history of nicotine dependence: Secondary | ICD-10-CM | POA: Diagnosis not present

## 2022-01-28 DIAGNOSIS — Z432 Encounter for attention to ileostomy: Secondary | ICD-10-CM | POA: Diagnosis not present

## 2022-01-28 DIAGNOSIS — E46 Unspecified protein-calorie malnutrition: Secondary | ICD-10-CM | POA: Diagnosis not present

## 2022-01-28 DIAGNOSIS — E785 Hyperlipidemia, unspecified: Secondary | ICD-10-CM | POA: Diagnosis not present

## 2022-01-28 DIAGNOSIS — K219 Gastro-esophageal reflux disease without esophagitis: Secondary | ICD-10-CM | POA: Diagnosis not present

## 2022-01-28 DIAGNOSIS — H538 Other visual disturbances: Secondary | ICD-10-CM | POA: Diagnosis not present

## 2022-01-28 DIAGNOSIS — D539 Nutritional anemia, unspecified: Secondary | ICD-10-CM | POA: Diagnosis not present

## 2022-01-28 DIAGNOSIS — I10 Essential (primary) hypertension: Secondary | ICD-10-CM | POA: Diagnosis not present

## 2022-01-28 DIAGNOSIS — R69 Illness, unspecified: Secondary | ICD-10-CM | POA: Diagnosis not present

## 2022-01-28 DIAGNOSIS — Z9221 Personal history of antineoplastic chemotherapy: Secondary | ICD-10-CM | POA: Diagnosis not present

## 2022-01-28 DIAGNOSIS — Z96653 Presence of artificial knee joint, bilateral: Secondary | ICD-10-CM | POA: Diagnosis not present

## 2022-01-28 DIAGNOSIS — M1990 Unspecified osteoarthritis, unspecified site: Secondary | ICD-10-CM | POA: Diagnosis not present

## 2022-01-28 DIAGNOSIS — H269 Unspecified cataract: Secondary | ICD-10-CM | POA: Diagnosis not present

## 2022-01-28 DIAGNOSIS — Z7952 Long term (current) use of systemic steroids: Secondary | ICD-10-CM | POA: Diagnosis not present

## 2022-01-28 DIAGNOSIS — E0500 Thyrotoxicosis with diffuse goiter without thyrotoxic crisis or storm: Secondary | ICD-10-CM | POA: Diagnosis not present

## 2022-01-28 DIAGNOSIS — Z853 Personal history of malignant neoplasm of breast: Secondary | ICD-10-CM | POA: Diagnosis not present

## 2022-01-28 DIAGNOSIS — C184 Malignant neoplasm of transverse colon: Secondary | ICD-10-CM | POA: Diagnosis not present

## 2022-01-28 DIAGNOSIS — Z431 Encounter for attention to gastrostomy: Secondary | ICD-10-CM | POA: Diagnosis not present

## 2022-01-28 DIAGNOSIS — C786 Secondary malignant neoplasm of retroperitoneum and peritoneum: Secondary | ICD-10-CM | POA: Diagnosis not present

## 2022-01-28 DIAGNOSIS — Z9181 History of falling: Secondary | ICD-10-CM | POA: Diagnosis not present

## 2022-01-28 DIAGNOSIS — K2270 Barrett's esophagus without dysplasia: Secondary | ICD-10-CM | POA: Diagnosis not present

## 2022-01-31 ENCOUNTER — Other Ambulatory Visit: Payer: Self-pay | Admitting: Oncology

## 2022-02-01 ENCOUNTER — Encounter (HOSPITAL_COMMUNITY): Admission: RE | Admit: 2022-02-01 | Payer: Medicare HMO | Source: Ambulatory Visit

## 2022-02-02 ENCOUNTER — Encounter: Payer: Self-pay | Admitting: *Deleted

## 2022-02-02 ENCOUNTER — Inpatient Hospital Stay: Payer: Medicare HMO

## 2022-02-02 ENCOUNTER — Other Ambulatory Visit: Payer: Self-pay | Admitting: *Deleted

## 2022-02-02 ENCOUNTER — Inpatient Hospital Stay (HOSPITAL_BASED_OUTPATIENT_CLINIC_OR_DEPARTMENT_OTHER): Payer: Medicare HMO | Admitting: Oncology

## 2022-02-02 VITALS — BP 151/85 | HR 100 | Temp 97.9°F | Resp 20 | Ht 59.0 in | Wt 120.6 lb

## 2022-02-02 DIAGNOSIS — C482 Malignant neoplasm of peritoneum, unspecified: Secondary | ICD-10-CM | POA: Diagnosis not present

## 2022-02-02 DIAGNOSIS — C785 Secondary malignant neoplasm of large intestine and rectum: Secondary | ICD-10-CM | POA: Diagnosis not present

## 2022-02-02 DIAGNOSIS — Z95828 Presence of other vascular implants and grafts: Secondary | ICD-10-CM

## 2022-02-02 DIAGNOSIS — Z5111 Encounter for antineoplastic chemotherapy: Secondary | ICD-10-CM | POA: Diagnosis not present

## 2022-02-02 DIAGNOSIS — C481 Malignant neoplasm of specified parts of peritoneum: Secondary | ICD-10-CM

## 2022-02-02 DIAGNOSIS — Z932 Ileostomy status: Secondary | ICD-10-CM | POA: Diagnosis not present

## 2022-02-02 DIAGNOSIS — R64 Cachexia: Secondary | ICD-10-CM | POA: Diagnosis not present

## 2022-02-02 DIAGNOSIS — G893 Neoplasm related pain (acute) (chronic): Secondary | ICD-10-CM | POA: Diagnosis not present

## 2022-02-02 DIAGNOSIS — M4856XA Collapsed vertebra, not elsewhere classified, lumbar region, initial encounter for fracture: Secondary | ICD-10-CM | POA: Diagnosis not present

## 2022-02-02 DIAGNOSIS — K5730 Diverticulosis of large intestine without perforation or abscess without bleeding: Secondary | ICD-10-CM | POA: Diagnosis not present

## 2022-02-02 DIAGNOSIS — I700 Atherosclerosis of aorta: Secondary | ICD-10-CM | POA: Diagnosis not present

## 2022-02-02 DIAGNOSIS — Z5189 Encounter for other specified aftercare: Secondary | ICD-10-CM | POA: Diagnosis not present

## 2022-02-02 DIAGNOSIS — C7889 Secondary malignant neoplasm of other digestive organs: Secondary | ICD-10-CM | POA: Diagnosis not present

## 2022-02-02 DIAGNOSIS — R911 Solitary pulmonary nodule: Secondary | ICD-10-CM | POA: Diagnosis not present

## 2022-02-02 LAB — CMP (CANCER CENTER ONLY)
ALT: 13 U/L (ref 0–44)
AST: 18 U/L (ref 15–41)
Albumin: 4.2 g/dL (ref 3.5–5.0)
Alkaline Phosphatase: 81 U/L (ref 38–126)
Anion gap: 8 (ref 5–15)
BUN: 22 mg/dL (ref 8–23)
CO2: 26 mmol/L (ref 22–32)
Calcium: 9.9 mg/dL (ref 8.9–10.3)
Chloride: 105 mmol/L (ref 98–111)
Creatinine: 0.51 mg/dL (ref 0.44–1.00)
GFR, Estimated: >60 mL/min (ref >60)
Glucose, Bld: 128 mg/dL — ABNORMAL HIGH (ref 70–99)
Potassium: 3.7 mmol/L (ref 3.5–5.1)
Sodium: 139 mmol/L (ref 135–145)
Total Bilirubin: 0.4 mg/dL (ref 0.3–1.2)
Total Protein: 7.1 g/dL (ref 6.5–8.1)

## 2022-02-02 LAB — CBC WITH DIFFERENTIAL (CANCER CENTER ONLY)
Abs Immature Granulocytes: 0.01 10*3/uL (ref 0.00–0.07)
Basophils Absolute: 0 10*3/uL (ref 0.0–0.1)
Basophils Relative: 1 %
Eosinophils Absolute: 0.1 10*3/uL (ref 0.0–0.5)
Eosinophils Relative: 1 %
HCT: 33 % — ABNORMAL LOW (ref 36.0–46.0)
Hemoglobin: 10.8 g/dL — ABNORMAL LOW (ref 12.0–15.0)
Immature Granulocytes: 0 %
Lymphocytes Relative: 15 %
Lymphs Abs: 1 10*3/uL (ref 0.7–4.0)
MCH: 36.2 pg — ABNORMAL HIGH (ref 26.0–34.0)
MCHC: 32.7 g/dL (ref 30.0–36.0)
MCV: 110.7 fL — ABNORMAL HIGH (ref 80.0–100.0)
Monocytes Absolute: 0.8 10*3/uL (ref 0.1–1.0)
Monocytes Relative: 12 %
Neutro Abs: 4.5 10*3/uL (ref 1.7–7.7)
Neutrophils Relative %: 71 %
Platelet Count: 337 10*3/uL (ref 150–400)
RBC: 2.98 MIL/uL — ABNORMAL LOW (ref 3.87–5.11)
RDW: 16 % — ABNORMAL HIGH (ref 11.5–15.5)
WBC Count: 6.4 10*3/uL (ref 4.0–10.5)
nRBC: 0 % (ref 0.0–0.2)

## 2022-02-02 LAB — MAGNESIUM: Magnesium: 1.9 mg/dL (ref 1.7–2.4)

## 2022-02-02 MED ORDER — HEPARIN SOD (PORK) LOCK FLUSH 100 UNIT/ML IV SOLN
500.0000 [IU] | Freq: Once | INTRAVENOUS | Status: AC
  Administered 2022-02-02: 500 [IU] via INTRAVENOUS

## 2022-02-02 MED ORDER — SODIUM CHLORIDE 0.9% FLUSH
10.0000 mL | INTRAVENOUS | Status: DC | PRN
  Administered 2022-02-02: 10 mL via INTRAVENOUS

## 2022-02-02 NOTE — Progress Notes (Signed)
Faxed referral order, demographics and chart information to [facility] [phone] to see Dr. Ronnette Hila to consider surgery. Notified radiology to push over her last CT C/A/P date 12/01 to [facility] imaging.

## 2022-02-02 NOTE — Progress Notes (Signed)
[place] OFFICE PROGRESS NOTE   Diagnosis: Primary peritoneal carcinoma  INTERVAL HISTORY:   Nancy Harrison completed another cycle of Taxol/carboplatin on 01/13/2022.  No nausea/vomiting, symptom of allergic reaction, or neuropathy symptoms.  No nausea or vomiting.  She saw Dr. Berline Lopes after the restaging CTs.  Dr. Berline Lopes recommends additional chemotherapy prior to considering cytoreductive surgery.  Objective:  Vital signs in last 24 hours:  Blood pressure (!) 151/85, pulse 100, temperature 97.9 F (36.6 C), temperature source Oral, resp. rate 20, height _0  (1.499 m), weight 120 lb 9.6 oz (54.7 kg), SpO2 100 %.    HEENT: No thrush or ulcers Resp: End inspiratory rhonchi at the right posterior base, no respiratory distress Cardio: Regular rate and rhythm GI: No hepatosplenomegaly, no apparent ascites, no mass, right abdomen colostomy Vascular: No leg edema    Portacath/PICC-without erythema  Lab Results:  Lab Results  Component Value Date   WBC 6.4 02/02/2022   HGB 10.8 (L) 02/02/2022   HCT 33.0 (L) 02/02/2022   MCV 110.7 (H) 02/02/2022   PLT 337 02/02/2022   NEUTROABS 4.5 02/02/2022    CMP  Lab Results  Component Value Date   NA 138 01/12/2022   K 4.3 01/12/2022   CL 103 01/12/2022   CO2 28 01/12/2022   GLUCOSE 113 (H) 01/12/2022   BUN 20 01/12/2022   CREATININE 0.60 01/12/2022   CALCIUM 10.4 (H) 01/12/2022   PROT 6.7 01/12/2022   ALBUMIN 4.2 01/12/2022   AST 19 01/12/2022   ALT 15 01/12/2022   ALKPHOS 71 01/12/2022   BILITOT 0.5 01/12/2022   GFRNONAA >60 01/12/2022    Lab Results  Component Value Date   CEA1 2.5 10/18/2021    Medications: I have reviewed the patient's current medications.   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  Bone lesions concerning for metastases, right lung nodule Laparoscopy, diverting loop ileostomy, gastrostomy tube placement, peritoneal biopsy 10/16/2021, no evidence of a primary tumor site at the appendix, ovaries, or uterus.  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  CDX2, CK20, and GATA3 negative.  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 Foundation 1-MSS, tumor mutation burden 5, HRD positive-LOH score 34.1% 08/11/2021 CA125 818 CT chest 11/03/2021-10 mm right lower lobe nodule, scattered tiny pulmonary nodules, peritoneal carcinomatosis Cycle 1 Taxol/carboplatin 11/12/2021 Cycle 2 Taxol/carboplatin 12/03/2021 Cycle 3 Taxol/carboplatin 12/24/2021 Bone scan 01/05/2022-negative for metastatic disease Cycle 4 Taxol/carboplatin 01/13/2022 CTs 01/22/2022-reduction in peritoneal carcinomatosis, no evidence of progressive disease stable bilateral lower lobe pulmonary nodules Cycle 5 Taxol/carboplatin 02/03/2022 2.   Abdominal distention/pain and diarrhea secondary to #1 3.   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  Lumpectomy and adjuvant CMF x8 followed by radiation 4. Hypertension 5. Hypothyroidism 6. Barrett's esophagus 7. Family history of multiple cancers including appendix, colon, and lung cancer 8.  Hospital admission 11/05/2021 with dehydration/prerenal azotemia secondary to nausea and high output ileostomy 9.  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  She has completed 4 cycles  of Taxol/carboplatin with  clinical, CA125, and radiologic improvement.  She saw Dr. Berline Lopes 01/26/2022.  Dr. Berline Lopes is concerned the remaining tumor may not be completely resectable at present.  She recommends additional chemotherapy.  Nancy Harrison agrees to proceed with 2 more cycles of Taxol/carboplatin beginning [place].  She would like to a consult with Dr. Clovis Riley to consider surgical options.  I reviewed the CT findings and images with Mr. Clydene Laming and her husband.  [name], MD  02/02/2022  12:01 PM

## 2022-02-02 NOTE — Progress Notes (Signed)
Patient seen by Dr. Benay Spice today  Vitals are within treatment parameters.  Labs reviewed by Dr. Benay Spice and are within treatment parameters.  Per physician team, patient is ready for treatment and there are NO modifications to the treatment plan.  Patient is OK for treatment on 02/03/22

## 2022-02-03 ENCOUNTER — Other Ambulatory Visit: Payer: Medicare HMO

## 2022-02-03 ENCOUNTER — Ambulatory Visit: Payer: Medicare HMO

## 2022-02-03 ENCOUNTER — Other Ambulatory Visit: Payer: Self-pay

## 2022-02-03 ENCOUNTER — Inpatient Hospital Stay: Payer: Medicare HMO

## 2022-02-03 ENCOUNTER — Ambulatory Visit: Payer: Medicare HMO | Admitting: Oncology

## 2022-02-03 VITALS — BP 138/81 | HR 98 | Temp 97.8°F | Resp 20

## 2022-02-03 DIAGNOSIS — Z932 Ileostomy status: Secondary | ICD-10-CM | POA: Diagnosis not present

## 2022-02-03 DIAGNOSIS — C481 Malignant neoplasm of specified parts of peritoneum: Secondary | ICD-10-CM

## 2022-02-03 DIAGNOSIS — C7889 Secondary malignant neoplasm of other digestive organs: Secondary | ICD-10-CM | POA: Diagnosis not present

## 2022-02-03 DIAGNOSIS — C482 Malignant neoplasm of peritoneum, unspecified: Secondary | ICD-10-CM | POA: Diagnosis not present

## 2022-02-03 DIAGNOSIS — Z5111 Encounter for antineoplastic chemotherapy: Secondary | ICD-10-CM | POA: Diagnosis not present

## 2022-02-03 DIAGNOSIS — G893 Neoplasm related pain (acute) (chronic): Secondary | ICD-10-CM | POA: Diagnosis not present

## 2022-02-03 DIAGNOSIS — K5730 Diverticulosis of large intestine without perforation or abscess without bleeding: Secondary | ICD-10-CM | POA: Diagnosis not present

## 2022-02-03 DIAGNOSIS — R911 Solitary pulmonary nodule: Secondary | ICD-10-CM | POA: Diagnosis not present

## 2022-02-03 DIAGNOSIS — M4856XA Collapsed vertebra, not elsewhere classified, lumbar region, initial encounter for fracture: Secondary | ICD-10-CM | POA: Diagnosis not present

## 2022-02-03 DIAGNOSIS — R64 Cachexia: Secondary | ICD-10-CM | POA: Diagnosis not present

## 2022-02-03 DIAGNOSIS — C785 Secondary malignant neoplasm of large intestine and rectum: Secondary | ICD-10-CM | POA: Diagnosis not present

## 2022-02-03 DIAGNOSIS — Z5189 Encounter for other specified aftercare: Secondary | ICD-10-CM | POA: Diagnosis not present

## 2022-02-03 DIAGNOSIS — I700 Atherosclerosis of aorta: Secondary | ICD-10-CM | POA: Diagnosis not present

## 2022-02-03 LAB — CA 125: Cancer Antigen (CA) 125: 30.4 U/mL (ref 0.0–38.1)

## 2022-02-03 MED ORDER — SODIUM CHLORIDE 0.9 % IV SOLN
10.0000 mg | Freq: Once | INTRAVENOUS | Status: AC
  Administered 2022-02-03: 10 mg via INTRAVENOUS
  Filled 2022-02-03: qty 10

## 2022-02-03 MED ORDER — SODIUM CHLORIDE 0.9% FLUSH
10.0000 mL | INTRAVENOUS | Status: DC | PRN
  Administered 2022-02-03: 10 mL

## 2022-02-03 MED ORDER — SODIUM CHLORIDE 0.9 % IV SOLN
175.0000 mg/m2 | Freq: Once | INTRAVENOUS | Status: AC
  Administered 2022-02-03: 258 mg via INTRAVENOUS
  Filled 2022-02-03: qty 43

## 2022-02-03 MED ORDER — SODIUM CHLORIDE 0.9 % IV SOLN
150.0000 mg | Freq: Once | INTRAVENOUS | Status: AC
  Administered 2022-02-03: 150 mg via INTRAVENOUS
  Filled 2022-02-03: qty 150

## 2022-02-03 MED ORDER — SODIUM CHLORIDE 0.9 % IV SOLN
318.0000 mg | Freq: Once | INTRAVENOUS | Status: AC
  Administered 2022-02-03: 320 mg via INTRAVENOUS
  Filled 2022-02-03: qty 32

## 2022-02-03 MED ORDER — HEPARIN SOD (PORK) LOCK FLUSH 100 UNIT/ML IV SOLN
500.0000 [IU] | Freq: Once | INTRAVENOUS | Status: AC | PRN
  Administered 2022-02-03: 500 [IU]

## 2022-02-03 MED ORDER — DIPHENHYDRAMINE HCL 50 MG/ML IJ SOLN
25.0000 mg | Freq: Once | INTRAMUSCULAR | Status: AC
  Administered 2022-02-03: 25 mg via INTRAVENOUS
  Filled 2022-02-03: qty 1

## 2022-02-03 MED ORDER — PALONOSETRON HCL INJECTION 0.25 MG/5ML
0.2500 mg | Freq: Once | INTRAVENOUS | Status: AC
  Administered 2022-02-03: 0.25 mg via INTRAVENOUS
  Filled 2022-02-03: qty 5

## 2022-02-03 MED ORDER — SODIUM CHLORIDE 0.9 % IV SOLN: Freq: Once | INTRAVENOUS | Status: AC

## 2022-02-03 MED ORDER — FAMOTIDINE IN NACL 20-0.9 MG/50ML-% IV SOLN
20.0000 mg | Freq: Once | INTRAVENOUS | Status: AC
  Administered 2022-02-03: 20 mg via INTRAVENOUS
  Filled 2022-02-03: qty 50

## 2022-02-03 NOTE — Patient Instructions (Signed)
[facility]   Discharge Instructions: Thank you for choosing [facility] to provide your oncology and hematology care.   If you have a lab appointment with the Cancer Center, please go directly to the Cancer Center and check in at the registration area.   Wear comfortable clothing and clothing appropriate for easy access to any Portacath or PICC line.   We strive to give you quality time with your provider. You may need to reschedule your appointment if you arrive late (15 or more minutes).  Arriving late affects you and other patients whose appointments are after yours.  Also, if you miss three or more appointments without notifying the office, you may be dismissed from the clinic at the provider's discretion.      For prescription refill requests, have your pharmacy contact our office and allow 72 hours for refills to be completed.    Today you received the following chemotherapy and/or immunotherapy agents Paclitaxel (TAXOL) & Carboplatin (PARAPLATIN).      To help prevent nausea and vomiting after your treatment, we encourage you to take your nausea medication as directed.  BELOW ARE SYMPTOMS THAT SHOULD BE REPORTED IMMEDIATELY: *FEVER GREATER THAN 100.4 F (38 C) OR HIGHER *CHILLS OR SWEATING *NAUSEA AND VOMITING THAT IS NOT CONTROLLED WITH YOUR NAUSEA MEDICATION *UNUSUAL SHORTNESS OF BREATH *UNUSUAL BRUISING OR BLEEDING *URINARY PROBLEMS (pain or burning when urinating, or frequent urination) *BOWEL PROBLEMS (unusual diarrhea, constipation, pain near the anus) TENDERNESS IN MOUTH AND THROAT WITH OR WITHOUT PRESENCE OF ULCERS (sore throat, sores in mouth, or a toothache) UNUSUAL RASH, SWELLING OR PAIN  UNUSUAL VAGINAL DISCHARGE OR ITCHING   Items with * indicate a potential emergency and should be followed up as soon as possible or go to the Emergency Department if any problems should occur.  Please show the CHEMOTHERAPY ALERT CARD or  IMMUNOTHERAPY ALERT CARD at check-in to the Emergency Department and triage nurse.  Should you have questions after your visit or need to cancel or reschedule your appointment, please contact [facility]  Dept: [phone]  and follow the prompts.  Office hours are 8:00 a.m. to 4:30 p.m. Monday - Friday. Please note that voicemails left after 4:00 p.m. may not be returned until the following business day.  We are closed weekends and major holidays. You have access to a nurse at all times for urgent questions. Please call the main number to the clinic Dept: [phone] and follow the prompts.   For any non-urgent questions, you may also contact your provider using MyChart. We now offer e-Visits for anyone 18 and older to request care online for non-urgent symptoms. For details visit [email or web address].   Also download the MyChart app! Go to the app store, search "MyChart", open the app, select [facility], and log in with your MyChart username and password.  Masks are optional in the cancer centers. If you would like for your care team to wear a mask while they are taking care of you, please let them know. You may have one support person who is at least 79 years old accompany you for your appointments.  Paclitaxel Injection What is this medication? PACLITAXEL (PAK li TAX el) treats some types of cancer. It works by slowing down the growth of cancer cells. This medicine may be used for other purposes; ask your health care provider or pharmacist if you have questions. COMMON BRAND NAME(S): Onxol, Taxol What should I tell my care   team before I take this medication? They need to know if you have any of these conditions: Heart disease Liver disease Low white blood cell levels An unusual or allergic reaction to paclitaxel, other medications, foods, dyes, or preservatives If you or your partner are pregnant or trying to get pregnant Breast-feeding How should I use  this medication? This medication is injected into a vein. It is given by your care team in a hospital or clinic setting. Talk to your care team about the use of this medication in children. While it may be given to children for selected conditions, precautions do apply. Overdosage: If you think you have taken too much of this medicine contact a poison control center or emergency room at once. NOTE: This medicine is only for you. Do not share this medicine with others. What if I miss a dose? Keep appointments for follow-up doses. It is important not to miss your dose. Call your care team if you are unable to keep an appointment. What may interact with this medication? Do not take this medication with any of the following: Live virus vaccines Other medications may affect the way this medication works. Talk with your care team about all of the medications you take. They may suggest changes to your treatment plan to lower the risk of side effects and to make sure your medications work as intended. This list may not describe all possible interactions. Give your health care provider a list of all the medicines, herbs, non-prescription drugs, or dietary supplements you use. Also tell them if you smoke, drink alcohol, or use illegal drugs. Some items may interact with your medicine. What should I watch for while using this medication? Your condition will be monitored carefully while you are receiving this medication. You may need blood work while taking this medication. This medication may make you feel generally unwell. This is not uncommon as chemotherapy can affect healthy cells as well as cancer cells. Report any side effects. Continue your course of treatment even though you feel ill unless your care team tells you to stop. This medication can cause serious allergic reactions. To reduce the risk, your care team may give you other medications to take before receiving this one. Be sure to follow the  directions from your care team. This medication may increase your risk of getting an infection. Call your care team for advice if you get a fever, chills, sore throat, or other symptoms of a cold or flu. Do not treat yourself. Try to avoid being around people who are sick. This medication may increase your risk to bruise or bleed. Call your care team if you notice any unusual bleeding. Be careful brushing or flossing your teeth or using a toothpick because you may get an infection or bleed more easily. If you have any dental work done, tell your dentist you are receiving this medication. Talk to your care team if you may be pregnant. Serious birth defects can occur if you take this medication during pregnancy. Talk to your care team before breastfeeding. Changes to your treatment plan may be needed. What side effects may I notice from receiving this medication? Side effects that you should report to your care team as soon as possible: Allergic reactions--skin rash, itching, hives, swelling of the face, lips, tongue, or throat Heart rhythm changes--fast or irregular heartbeat, dizziness, feeling faint or lightheaded, chest pain, trouble breathing Increase in blood pressure Infection--fever, chills, cough, sore throat, wounds that don't heal, pain or trouble when   passing urine, general feeling of discomfort or being unwell Low blood pressure--dizziness, feeling faint or lightheaded, blurry vision Low red blood cell level--unusual weakness or fatigue, dizziness, headache, trouble breathing Painful swelling, warmth, or redness of the skin, blisters or sores at the infusion site Pain, tingling, or numbness in the hands or feet Slow heartbeat--dizziness, feeling faint or lightheaded, confusion, trouble breathing, unusual weakness or fatigue Unusual bruising or bleeding Side effects that usually do not require medical attention (report to your care team if they continue or are bothersome): Diarrhea Hair  loss Joint pain Loss of appetite Muscle pain Nausea Vomiting This list may not describe all possible side effects. Call your doctor for medical advice about side effects. You may report side effects to FDA at 1-800-FDA-1088. Where should I keep my medication? This medication is given in a hospital or clinic. It will not be stored at home. NOTE: This sheet is a summary. It may not cover all possible information. If you have questions about this medicine, talk to your doctor, pharmacist, or health care provider.  2023 Elsevier/Gold Standard (2021-06-10 00:00:00)  Carboplatin Injection What is this medication? CARBOPLATIN (KAR boe pla tin) treats some types of cancer. It works by slowing down the growth of cancer cells. This medicine may be used for other purposes; ask your health care provider or pharmacist if you have questions. COMMON BRAND NAME(S): Paraplatin What should I tell my care team before I take this medication? They need to know if you have any of these conditions: Blood disorders Hearing problems Kidney disease Recent or ongoing radiation therapy An unusual or allergic reaction to carboplatin, cisplatin, other medications, foods, dyes, or preservatives Pregnant or trying to get pregnant Breast-feeding How should I use this medication? This medication is injected into a vein. It is given by your care team in a hospital or clinic setting. Talk to your care team about the use of this medication in children. Special care may be needed. Overdosage: If you think you have taken too much of this medicine contact a poison control center or emergency room at once. NOTE: This medicine is only for you. Do not share this medicine with others. What if I miss a dose? Keep appointments for follow-up doses. It is important not to miss your dose. Call your care team if you are unable to keep an appointment. What may interact with this medication? Medications for seizures Some  antibiotics, such as amikacin, gentamicin, neomycin, streptomycin, tobramycin Vaccines This list may not describe all possible interactions. Give your health care provider a list of all the medicines, herbs, non-prescription drugs, or dietary supplements you use. Also tell them if you smoke, drink alcohol, or use illegal drugs. Some items may interact with your medicine. What should I watch for while using this medication? Your condition will be monitored carefully while you are receiving this medication. You may need blood work while taking this medication. This medication may make you feel generally unwell. This is not uncommon, as chemotherapy can affect healthy cells as well as cancer cells. Report any side effects. Continue your course of treatment even though you feel ill unless your care team tells you to stop. In some cases, you may be given additional medications to help with side effects. Follow all directions for their use. This medication may increase your risk of getting an infection. Call your care team for advice if you get a fever, chills, sore throat, or other symptoms of a cold or flu. Do not treat   yourself. Try to avoid being around people who are sick. Avoid taking medications that contain aspirin, acetaminophen, ibuprofen, naproxen, or ketoprofen unless instructed by your care team. These medications may hide a fever. Be careful brushing or flossing your teeth or using a toothpick because you may get an infection or bleed more easily. If you have any dental work done, tell your dentist you are receiving this medication. Talk to your care team if you wish to become pregnant or think you might be pregnant. This medication can cause serious birth defects. Talk to your care team about effective forms of contraception. Do not breast-feed while taking this medication. What side effects may I notice from receiving this medication? Side effects that you should report to your care team as  soon as possible: Allergic reactions--skin rash, itching, hives, swelling of the face, lips, tongue, or throat Infection--fever, chills, cough, sore throat, wounds that don't heal, pain or trouble when passing urine, general feeling of discomfort or being unwell Low red blood cell level--unusual weakness or fatigue, dizziness, headache, trouble breathing Pain, tingling, or numbness in the hands or feet, muscle weakness, change in vision, confusion or trouble speaking, loss of balance or coordination, trouble walking, seizures Unusual bruising or bleeding Side effects that usually do not require medical attention (report to your care team if they continue or are bothersome): Hair loss Nausea Unusual weakness or fatigue Vomiting This list may not describe all possible side effects. Call your doctor for medical advice about side effects. You may report side effects to FDA at 1-800-FDA-1088. Where should I keep my medication? This medication is given in a hospital or clinic. It will not be stored at home. NOTE: This sheet is a summary. It may not cover all possible information. If you have questions about this medicine, talk to your doctor, pharmacist, or health care provider.  2023 Elsevier/Gold Standard (2021-05-25 00:00:00) 

## 2022-02-04 ENCOUNTER — Inpatient Hospital Stay: Payer: Medicare HMO

## 2022-02-04 VITALS — BP 152/82 | HR 105 | Temp 97.9°F | Resp 20

## 2022-02-04 DIAGNOSIS — R64 Cachexia: Secondary | ICD-10-CM | POA: Diagnosis not present

## 2022-02-04 DIAGNOSIS — C7889 Secondary malignant neoplasm of other digestive organs: Secondary | ICD-10-CM | POA: Diagnosis not present

## 2022-02-04 DIAGNOSIS — Z5111 Encounter for antineoplastic chemotherapy: Secondary | ICD-10-CM | POA: Diagnosis not present

## 2022-02-04 DIAGNOSIS — C785 Secondary malignant neoplasm of large intestine and rectum: Secondary | ICD-10-CM | POA: Diagnosis not present

## 2022-02-04 DIAGNOSIS — Z5189 Encounter for other specified aftercare: Secondary | ICD-10-CM | POA: Diagnosis not present

## 2022-02-04 DIAGNOSIS — K5730 Diverticulosis of large intestine without perforation or abscess without bleeding: Secondary | ICD-10-CM | POA: Diagnosis not present

## 2022-02-04 DIAGNOSIS — R911 Solitary pulmonary nodule: Secondary | ICD-10-CM | POA: Diagnosis not present

## 2022-02-04 DIAGNOSIS — C482 Malignant neoplasm of peritoneum, unspecified: Secondary | ICD-10-CM | POA: Diagnosis not present

## 2022-02-04 DIAGNOSIS — Z932 Ileostomy status: Secondary | ICD-10-CM | POA: Diagnosis not present

## 2022-02-04 DIAGNOSIS — C481 Malignant neoplasm of specified parts of peritoneum: Secondary | ICD-10-CM

## 2022-02-04 DIAGNOSIS — M4856XA Collapsed vertebra, not elsewhere classified, lumbar region, initial encounter for fracture: Secondary | ICD-10-CM | POA: Diagnosis not present

## 2022-02-04 DIAGNOSIS — G893 Neoplasm related pain (acute) (chronic): Secondary | ICD-10-CM | POA: Diagnosis not present

## 2022-02-04 DIAGNOSIS — I700 Atherosclerosis of aorta: Secondary | ICD-10-CM | POA: Diagnosis not present

## 2022-02-04 MED ORDER — PEGFILGRASTIM-JMDB 6 MG/0.6ML ~~LOC~~ SOSY
6.0000 mg | PREFILLED_SYRINGE | Freq: Once | SUBCUTANEOUS | Status: AC
  Administered 2022-02-04: 6 mg via SUBCUTANEOUS
  Filled 2022-02-04: qty 0.6

## 2022-02-04 NOTE — Patient Instructions (Signed)

## 2022-02-05 DIAGNOSIS — E46 Unspecified protein-calorie malnutrition: Secondary | ICD-10-CM | POA: Diagnosis not present

## 2022-02-05 DIAGNOSIS — E0500 Thyrotoxicosis with diffuse goiter without thyrotoxic crisis or storm: Secondary | ICD-10-CM | POA: Diagnosis not present

## 2022-02-05 DIAGNOSIS — R69 Illness, unspecified: Secondary | ICD-10-CM | POA: Diagnosis not present

## 2022-02-05 DIAGNOSIS — K219 Gastro-esophageal reflux disease without esophagitis: Secondary | ICD-10-CM | POA: Diagnosis not present

## 2022-02-05 DIAGNOSIS — Z87891 Personal history of nicotine dependence: Secondary | ICD-10-CM | POA: Diagnosis not present

## 2022-02-05 DIAGNOSIS — H538 Other visual disturbances: Secondary | ICD-10-CM | POA: Diagnosis not present

## 2022-02-05 DIAGNOSIS — H269 Unspecified cataract: Secondary | ICD-10-CM | POA: Diagnosis not present

## 2022-02-05 DIAGNOSIS — Z853 Personal history of malignant neoplasm of breast: Secondary | ICD-10-CM | POA: Diagnosis not present

## 2022-02-05 DIAGNOSIS — K2270 Barrett's esophagus without dysplasia: Secondary | ICD-10-CM | POA: Diagnosis not present

## 2022-02-05 DIAGNOSIS — R350 Frequency of micturition: Secondary | ICD-10-CM | POA: Diagnosis not present

## 2022-02-05 DIAGNOSIS — Z96653 Presence of artificial knee joint, bilateral: Secondary | ICD-10-CM | POA: Diagnosis not present

## 2022-02-05 DIAGNOSIS — Z431 Encounter for attention to gastrostomy: Secondary | ICD-10-CM | POA: Diagnosis not present

## 2022-02-05 DIAGNOSIS — C184 Malignant neoplasm of transverse colon: Secondary | ICD-10-CM | POA: Diagnosis not present

## 2022-02-05 DIAGNOSIS — Z9221 Personal history of antineoplastic chemotherapy: Secondary | ICD-10-CM | POA: Diagnosis not present

## 2022-02-05 DIAGNOSIS — D539 Nutritional anemia, unspecified: Secondary | ICD-10-CM | POA: Diagnosis not present

## 2022-02-05 DIAGNOSIS — M1990 Unspecified osteoarthritis, unspecified site: Secondary | ICD-10-CM | POA: Diagnosis not present

## 2022-02-05 DIAGNOSIS — Z7952 Long term (current) use of systemic steroids: Secondary | ICD-10-CM | POA: Diagnosis not present

## 2022-02-05 DIAGNOSIS — E785 Hyperlipidemia, unspecified: Secondary | ICD-10-CM | POA: Diagnosis not present

## 2022-02-05 DIAGNOSIS — C786 Secondary malignant neoplasm of retroperitoneum and peritoneum: Secondary | ICD-10-CM | POA: Diagnosis not present

## 2022-02-05 DIAGNOSIS — Z9181 History of falling: Secondary | ICD-10-CM | POA: Diagnosis not present

## 2022-02-05 DIAGNOSIS — N3941 Urge incontinence: Secondary | ICD-10-CM | POA: Diagnosis not present

## 2022-02-05 DIAGNOSIS — I10 Essential (primary) hypertension: Secondary | ICD-10-CM | POA: Diagnosis not present

## 2022-02-05 DIAGNOSIS — Z432 Encounter for attention to ileostomy: Secondary | ICD-10-CM | POA: Diagnosis not present

## 2022-02-08 ENCOUNTER — Other Ambulatory Visit: Payer: Self-pay | Admitting: Oncology

## 2022-02-09 DIAGNOSIS — C786 Secondary malignant neoplasm of retroperitoneum and peritoneum: Secondary | ICD-10-CM | POA: Diagnosis not present

## 2022-02-09 DIAGNOSIS — Z96653 Presence of artificial knee joint, bilateral: Secondary | ICD-10-CM | POA: Diagnosis not present

## 2022-02-09 DIAGNOSIS — I10 Essential (primary) hypertension: Secondary | ICD-10-CM | POA: Diagnosis not present

## 2022-02-09 DIAGNOSIS — Z853 Personal history of malignant neoplasm of breast: Secondary | ICD-10-CM | POA: Diagnosis not present

## 2022-02-09 DIAGNOSIS — D539 Nutritional anemia, unspecified: Secondary | ICD-10-CM | POA: Diagnosis not present

## 2022-02-09 DIAGNOSIS — K2270 Barrett's esophagus without dysplasia: Secondary | ICD-10-CM | POA: Diagnosis not present

## 2022-02-09 DIAGNOSIS — C184 Malignant neoplasm of transverse colon: Secondary | ICD-10-CM | POA: Diagnosis not present

## 2022-02-09 DIAGNOSIS — Z9221 Personal history of antineoplastic chemotherapy: Secondary | ICD-10-CM | POA: Diagnosis not present

## 2022-02-09 DIAGNOSIS — R69 Illness, unspecified: Secondary | ICD-10-CM | POA: Diagnosis not present

## 2022-02-09 DIAGNOSIS — Z431 Encounter for attention to gastrostomy: Secondary | ICD-10-CM | POA: Diagnosis not present

## 2022-02-09 DIAGNOSIS — Z9181 History of falling: Secondary | ICD-10-CM | POA: Diagnosis not present

## 2022-02-09 DIAGNOSIS — E785 Hyperlipidemia, unspecified: Secondary | ICD-10-CM | POA: Diagnosis not present

## 2022-02-09 DIAGNOSIS — K219 Gastro-esophageal reflux disease without esophagitis: Secondary | ICD-10-CM | POA: Diagnosis not present

## 2022-02-09 DIAGNOSIS — M1990 Unspecified osteoarthritis, unspecified site: Secondary | ICD-10-CM | POA: Diagnosis not present

## 2022-02-09 DIAGNOSIS — Z87891 Personal history of nicotine dependence: Secondary | ICD-10-CM | POA: Diagnosis not present

## 2022-02-09 DIAGNOSIS — H538 Other visual disturbances: Secondary | ICD-10-CM | POA: Diagnosis not present

## 2022-02-09 DIAGNOSIS — Z7952 Long term (current) use of systemic steroids: Secondary | ICD-10-CM | POA: Diagnosis not present

## 2022-02-09 DIAGNOSIS — H269 Unspecified cataract: Secondary | ICD-10-CM | POA: Diagnosis not present

## 2022-02-09 DIAGNOSIS — E0500 Thyrotoxicosis with diffuse goiter without thyrotoxic crisis or storm: Secondary | ICD-10-CM | POA: Diagnosis not present

## 2022-02-09 DIAGNOSIS — Z432 Encounter for attention to ileostomy: Secondary | ICD-10-CM | POA: Diagnosis not present

## 2022-02-09 DIAGNOSIS — E46 Unspecified protein-calorie malnutrition: Secondary | ICD-10-CM | POA: Diagnosis not present

## 2022-02-10 ENCOUNTER — Inpatient Hospital Stay: Admit: 2022-02-10 | Payer: Medicare HMO | Admitting: Gynecologic Oncology

## 2022-02-10 DIAGNOSIS — C481 Malignant neoplasm of specified parts of peritoneum: Secondary | ICD-10-CM

## 2022-02-10 DIAGNOSIS — C786 Secondary malignant neoplasm of retroperitoneum and peritoneum: Secondary | ICD-10-CM

## 2022-02-10 SURGERY — LAPAROSCOPY, DIAGNOSTIC
Anesthesia: General

## 2022-02-11 DIAGNOSIS — Z932 Ileostomy status: Secondary | ICD-10-CM | POA: Diagnosis not present

## 2022-02-16 DIAGNOSIS — Z9221 Personal history of antineoplastic chemotherapy: Secondary | ICD-10-CM | POA: Diagnosis not present

## 2022-02-16 DIAGNOSIS — M1990 Unspecified osteoarthritis, unspecified site: Secondary | ICD-10-CM | POA: Diagnosis not present

## 2022-02-16 DIAGNOSIS — Z432 Encounter for attention to ileostomy: Secondary | ICD-10-CM | POA: Diagnosis not present

## 2022-02-16 DIAGNOSIS — I10 Essential (primary) hypertension: Secondary | ICD-10-CM | POA: Diagnosis not present

## 2022-02-16 DIAGNOSIS — Z9181 History of falling: Secondary | ICD-10-CM | POA: Diagnosis not present

## 2022-02-16 DIAGNOSIS — Z431 Encounter for attention to gastrostomy: Secondary | ICD-10-CM | POA: Diagnosis not present

## 2022-02-16 DIAGNOSIS — H269 Unspecified cataract: Secondary | ICD-10-CM | POA: Diagnosis not present

## 2022-02-16 DIAGNOSIS — R69 Illness, unspecified: Secondary | ICD-10-CM | POA: Diagnosis not present

## 2022-02-16 DIAGNOSIS — Z7952 Long term (current) use of systemic steroids: Secondary | ICD-10-CM | POA: Diagnosis not present

## 2022-02-16 DIAGNOSIS — Z87891 Personal history of nicotine dependence: Secondary | ICD-10-CM | POA: Diagnosis not present

## 2022-02-16 DIAGNOSIS — D539 Nutritional anemia, unspecified: Secondary | ICD-10-CM | POA: Diagnosis not present

## 2022-02-16 DIAGNOSIS — Z96653 Presence of artificial knee joint, bilateral: Secondary | ICD-10-CM | POA: Diagnosis not present

## 2022-02-16 DIAGNOSIS — H538 Other visual disturbances: Secondary | ICD-10-CM | POA: Diagnosis not present

## 2022-02-16 DIAGNOSIS — C184 Malignant neoplasm of transverse colon: Secondary | ICD-10-CM | POA: Diagnosis not present

## 2022-02-16 DIAGNOSIS — E0500 Thyrotoxicosis with diffuse goiter without thyrotoxic crisis or storm: Secondary | ICD-10-CM | POA: Diagnosis not present

## 2022-02-16 DIAGNOSIS — E785 Hyperlipidemia, unspecified: Secondary | ICD-10-CM | POA: Diagnosis not present

## 2022-02-16 DIAGNOSIS — Z853 Personal history of malignant neoplasm of breast: Secondary | ICD-10-CM | POA: Diagnosis not present

## 2022-02-16 DIAGNOSIS — C786 Secondary malignant neoplasm of retroperitoneum and peritoneum: Secondary | ICD-10-CM | POA: Diagnosis not present

## 2022-02-16 DIAGNOSIS — K219 Gastro-esophageal reflux disease without esophagitis: Secondary | ICD-10-CM | POA: Diagnosis not present

## 2022-02-16 DIAGNOSIS — E46 Unspecified protein-calorie malnutrition: Secondary | ICD-10-CM | POA: Diagnosis not present

## 2022-02-16 DIAGNOSIS — K2270 Barrett's esophagus without dysplasia: Secondary | ICD-10-CM | POA: Diagnosis not present

## 2022-02-19 ENCOUNTER — Other Ambulatory Visit: Payer: Self-pay | Admitting: Oncology

## 2022-02-20 ENCOUNTER — Encounter: Payer: Self-pay | Admitting: Nurse Practitioner

## 2022-02-22 ENCOUNTER — Other Ambulatory Visit: Payer: Self-pay | Admitting: Oncology

## 2022-02-23 ENCOUNTER — Encounter: Payer: Self-pay | Admitting: Nurse Practitioner

## 2022-02-23 ENCOUNTER — Inpatient Hospital Stay: Payer: Medicare HMO | Attending: Oncology

## 2022-02-23 ENCOUNTER — Inpatient Hospital Stay: Payer: Medicare HMO

## 2022-02-23 ENCOUNTER — Inpatient Hospital Stay: Payer: Medicare HMO | Admitting: Nurse Practitioner

## 2022-02-23 VITALS — BP 140/80 | HR 100 | Temp 98.1°F | Resp 18 | Ht 59.0 in | Wt 122.0 lb

## 2022-02-23 DIAGNOSIS — C482 Malignant neoplasm of peritoneum, unspecified: Secondary | ICD-10-CM | POA: Insufficient documentation

## 2022-02-23 DIAGNOSIS — C481 Malignant neoplasm of specified parts of peritoneum: Secondary | ICD-10-CM

## 2022-02-23 DIAGNOSIS — C579 Malignant neoplasm of female genital organ, unspecified: Secondary | ICD-10-CM | POA: Diagnosis not present

## 2022-02-23 DIAGNOSIS — C786 Secondary malignant neoplasm of retroperitoneum and peritoneum: Secondary | ICD-10-CM | POA: Diagnosis not present

## 2022-02-23 DIAGNOSIS — Z5111 Encounter for antineoplastic chemotherapy: Secondary | ICD-10-CM | POA: Insufficient documentation

## 2022-02-23 DIAGNOSIS — Z170 Estrogen receptor positive status [ER+]: Secondary | ICD-10-CM | POA: Diagnosis not present

## 2022-02-23 LAB — CBC WITH DIFFERENTIAL (CANCER CENTER ONLY)
Abs Immature Granulocytes: 0.02 10*3/uL (ref 0.00–0.07)
Basophils Absolute: 0.1 10*3/uL (ref 0.0–0.1)
Basophils Relative: 1 %
Eosinophils Absolute: 0.1 10*3/uL (ref 0.0–0.5)
Eosinophils Relative: 1 %
HCT: 35.8 % — ABNORMAL LOW (ref 36.0–46.0)
Hemoglobin: 11.8 g/dL — ABNORMAL LOW (ref 12.0–15.0)
Immature Granulocytes: 0 %
Lymphocytes Relative: 15 %
Lymphs Abs: 1.1 10*3/uL (ref 0.7–4.0)
MCH: 36.3 pg — ABNORMAL HIGH (ref 26.0–34.0)
MCHC: 33 g/dL (ref 30.0–36.0)
MCV: 110.2 fL — ABNORMAL HIGH (ref 80.0–100.0)
Monocytes Absolute: 0.9 10*3/uL (ref 0.1–1.0)
Monocytes Relative: 11 %
Neutro Abs: 5.6 10*3/uL (ref 1.7–7.7)
Neutrophils Relative %: 72 %
Platelet Count: 306 10*3/uL (ref 150–400)
RBC: 3.25 MIL/uL — ABNORMAL LOW (ref 3.87–5.11)
RDW: 13.8 % (ref 11.5–15.5)
WBC Count: 7.8 10*3/uL (ref 4.0–10.5)
nRBC: 0 % (ref 0.0–0.2)

## 2022-02-23 LAB — CMP (CANCER CENTER ONLY)
ALT: 15 U/L (ref 0–44)
AST: 23 U/L (ref 15–41)
Albumin: 4.2 g/dL (ref 3.5–5.0)
Alkaline Phosphatase: 83 U/L (ref 38–126)
Anion gap: 8 (ref 5–15)
BUN: 21 mg/dL (ref 8–23)
CO2: 27 mmol/L (ref 22–32)
Calcium: 9.9 mg/dL (ref 8.9–10.3)
Chloride: 104 mmol/L (ref 98–111)
Creatinine: 0.46 mg/dL (ref 0.44–1.00)
GFR, Estimated: >60 mL/min (ref >60)
Glucose, Bld: 139 mg/dL — ABNORMAL HIGH (ref 70–99)
Potassium: 4 mmol/L (ref 3.5–5.1)
Sodium: 139 mmol/L (ref 135–145)
Total Bilirubin: 0.3 mg/dL (ref 0.3–1.2)
Total Protein: 7.1 g/dL (ref 6.5–8.1)

## 2022-02-23 NOTE — Progress Notes (Signed)
[facility] OFFICE PROGRESS NOTE   Diagnosis: Primary peritoneal carcinoma  INTERVAL HISTORY:   Nancy Harrison returns as scheduled.  She completed cycle 5 Taxol/carboplatin 02/03/2022.  She denies nausea/vomiting.  No diarrhea or constipation.  She had a single mouth sore.  She does not think this was related to the chemotherapy.  She has a mild abnormal sensation over the fingertips.  She does not describe this as numbness or tingling.  No interference with activity.  Her husband was recently diagnosed with COVID.  She denies symptoms.  Objective:  Vital signs in last 24 hours:  Blood pressure (!) 140/80, pulse 100, temperature 98.1 F (36.7 C), temperature source Oral, resp. rate 18, height _0  (1.499 m), weight 122 lb (55.3 kg), SpO2 100 %.    HEENT: No thrush or ulcers. Resp: Lungs clear bilaterally. Cardio: Regular rate and rhythm. GI: Abdomen soft and nontender.  No hepatosplenomegaly.  No mass.  No apparent ascites.  Right abdomen colostomy. Vascular: Trace bilateral ankle edema. Neuro: Vibratory sense mildly decreased over the fingertips per tuning fork exam. Port-A-Cath without erythema.  Lab Results:  Lab Results  Component Value Date   WBC 7.8 02/23/2022   HGB 11.8 (L) 02/23/2022   HCT 35.8 (L) 02/23/2022   MCV 110.2 (H) 02/23/2022   PLT 306 02/23/2022   NEUTROABS 5.6 02/23/2022    Imaging:  No results found.  Medications: I have reviewed the patient's current medications.  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  Bone lesions concerning for metastases, right lung nodule Laparoscopy, diverting loop ileostomy, gastrostomy tube placement,  peritoneal biopsy 10/16/2021, no evidence of a primary tumor site at the appendix, ovaries, or uterus.  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  CDX2, CK20, and GATA3 negative.  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 Foundation 1-MSS, tumor mutation burden 5, HRD positive-LOH score 34.1% 08/11/2021 CA125 818 CT chest 11/03/2021-10 mm right lower lobe nodule, scattered tiny pulmonary nodules, peritoneal carcinomatosis Cycle 1 Taxol/carboplatin 11/12/2021 Cycle 2 Taxol/carboplatin 12/03/2021 Cycle 3 Taxol/carboplatin 12/24/2021 Bone scan 01/05/2022-negative for metastatic disease Cycle 4 Taxol/carboplatin 01/13/2022 CTs 01/22/2022-reduction in peritoneal carcinomatosis, no evidence of progressive disease stable bilateral lower lobe pulmonary nodules Cycle 5 Taxol/carboplatin 02/03/2022 Cycle 6 Taxol/carboplatin 02/24/2022 2.   Abdominal distention/pain and diarrhea secondary to #1 3.   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  Lumpectomy and adjuvant CMF x8 followed by radiation 4. Hypertension 5. Hypothyroidism 6. Barrett's esophagus 7. Family history of multiple cancers including appendix, colon, and lung cancer 8.  Hospital admission 11/05/2021 with dehydration/prerenal azotemia secondary to nausea and high output ileostomy 9.  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  She has completed 5 cycles of Taxol/carboplatin.  She is tolerating chemotherapy well.  Plan to proceed with the sixth and final cycle as scheduled on 02/24/2022.  She has an appointment with Dr. Clovis Riley at [facility] on 03/03/2022.  CBC from today reviewed.  Counts adequate  to proceed with treatment.  She will return for follow-up in approximately 3 weeks.  We are available to see her sooner if needed.    Ned Card ANP/GNP-BC   02/23/2022  1:39 PM

## 2022-02-24 ENCOUNTER — Inpatient Hospital Stay: Payer: Medicare HMO

## 2022-02-24 VITALS — BP 147/64 | HR 100 | Temp 97.7°F | Resp 20

## 2022-02-24 DIAGNOSIS — C481 Malignant neoplasm of specified parts of peritoneum: Secondary | ICD-10-CM

## 2022-02-24 DIAGNOSIS — C482 Malignant neoplasm of peritoneum, unspecified: Secondary | ICD-10-CM | POA: Diagnosis not present

## 2022-02-24 DIAGNOSIS — Z5111 Encounter for antineoplastic chemotherapy: Secondary | ICD-10-CM | POA: Diagnosis not present

## 2022-02-24 MED ORDER — DIPHENHYDRAMINE HCL 50 MG/ML IJ SOLN
25.0000 mg | Freq: Once | INTRAMUSCULAR | Status: AC
  Administered 2022-02-24: 25 mg via INTRAVENOUS
  Filled 2022-02-24: qty 1

## 2022-02-24 MED ORDER — SODIUM CHLORIDE 0.9 % IV SOLN
150.0000 mg | Freq: Once | INTRAVENOUS | Status: AC
  Administered 2022-02-24: 150 mg via INTRAVENOUS
  Filled 2022-02-24: qty 150

## 2022-02-24 MED ORDER — PALONOSETRON HCL INJECTION 0.25 MG/5ML
0.2500 mg | Freq: Once | INTRAVENOUS | Status: AC
  Administered 2022-02-24: 0.25 mg via INTRAVENOUS
  Filled 2022-02-24: qty 5

## 2022-02-24 MED ORDER — SODIUM CHLORIDE 0.9% FLUSH
10.0000 mL | INTRAVENOUS | Status: DC | PRN
  Administered 2022-02-24: 10 mL

## 2022-02-24 MED ORDER — SODIUM CHLORIDE 0.9 % IV SOLN
318.0000 mg | Freq: Once | INTRAVENOUS | Status: AC
  Administered 2022-02-24: 320 mg via INTRAVENOUS
  Filled 2022-02-24: qty 32

## 2022-02-24 MED ORDER — FAMOTIDINE IN NACL 20-0.9 MG/50ML-% IV SOLN
20.0000 mg | Freq: Once | INTRAVENOUS | Status: AC
  Administered 2022-02-24: 20 mg via INTRAVENOUS
  Filled 2022-02-24: qty 50

## 2022-02-24 MED ORDER — SODIUM CHLORIDE 0.9 % IV SOLN
175.0000 mg/m2 | Freq: Once | INTRAVENOUS | Status: AC
  Administered 2022-02-24: 258 mg via INTRAVENOUS
  Filled 2022-02-24: qty 43

## 2022-02-24 MED ORDER — SODIUM CHLORIDE 0.9 % IV SOLN
10.0000 mg | Freq: Once | INTRAVENOUS | Status: AC
  Administered 2022-02-24: 10 mg via INTRAVENOUS
  Filled 2022-02-24: qty 10

## 2022-02-24 MED ORDER — HEPARIN SOD (PORK) LOCK FLUSH 100 UNIT/ML IV SOLN
500.0000 [IU] | Freq: Once | INTRAVENOUS | Status: AC | PRN
  Administered 2022-02-24: 500 [IU]

## 2022-02-24 MED ORDER — SODIUM CHLORIDE 0.9 % IV SOLN: Freq: Once | INTRAVENOUS | Status: AC

## 2022-02-24 NOTE — Patient Instructions (Signed)
Nancy Harrison   Discharge Instructions: Thank you for choosing [facility] to provide your oncology and hematology care.   If you have a lab appointment with the [facility], please go directly to the [facility] and check in at the registration area.   Wear comfortable clothing and clothing appropriate for easy access to any Portacath or PICC line.   We strive to give you quality time with your provider. You may need to reschedule your appointment if you arrive late (15 or more minutes).  Arriving late affects you and other patients whose appointments are after yours.  Also, if you miss three or more appointments without notifying the office, you may be dismissed from the clinic at the provider's discretion.      For prescription refill requests, have your pharmacy contact our office and allow 72 hours for refills to be completed.    Today you received the following chemotherapy and/or immunotherapy agents Paclitaxel (TAXOL) & Carboplatin (PARAPLATIN).      To help prevent nausea and vomiting after your treatment, we encourage you to take your nausea medication as directed.  BELOW ARE SYMPTOMS THAT SHOULD BE REPORTED IMMEDIATELY: *FEVER GREATER THAN 100.4 F (38 C) OR HIGHER *CHILLS OR SWEATING *NAUSEA AND VOMITING THAT IS NOT CONTROLLED WITH YOUR NAUSEA MEDICATION *UNUSUAL SHORTNESS OF BREATH *UNUSUAL BRUISING OR BLEEDING *URINARY PROBLEMS (pain or burning when urinating, or frequent urination) *BOWEL PROBLEMS (unusual diarrhea, constipation, pain near the anus) TENDERNESS IN MOUTH AND THROAT WITH OR WITHOUT PRESENCE OF ULCERS (sore throat, sores in mouth, or a toothache) UNUSUAL RASH, SWELLING OR PAIN  UNUSUAL VAGINAL DISCHARGE OR ITCHING   Items with * indicate a potential emergency and should be followed up as soon as possible or go to the Emergency Department if any problems should occur.  Please show the CHEMOTHERAPY ALERT CARD or  IMMUNOTHERAPY ALERT CARD at check-in to the Emergency Department and triage nurse.  Should you have questions after your visit or need to cancel or reschedule your appointment, please contact [facility]  Dept: [phone]  and follow the prompts.  Office hours are 8:00 a.m. to 4:30 p.m. Monday - Friday. Please note that voicemails left after 4:00 p.m. may not be returned until the following business day.  We are closed weekends and major holidays. You have access to a nurse at all times for urgent questions. Please call the main number to the clinic Dept: [phone] and follow the prompts.   For any non-urgent questions, you may also contact your provider using MyChart. We now offer e-Visits for anyone 84 and older to request care online for non-urgent symptoms. For details visit [email or web address].   Also download the MyChart app! Go to the app store, search "MyChart", open the app, select [facility], and log in with your MyChart username and password.  Paclitaxel Injection What is this medication? PACLITAXEL (PAK li TAX el) treats some types of cancer. It works by slowing down the growth of cancer cells. This medicine may be used for other purposes; ask your health care provider or pharmacist if you have questions. COMMON BRAND NAME(S): Onxol, Taxol What should I tell my care team before I take this medication? They need to know if you have any of these conditions: Heart disease Liver disease Low white blood cell levels An unusual or allergic reaction to paclitaxel, other medications, foods, dyes, or preservatives If you or your partner are pregnant or trying to  get pregnant Breast-feeding How should I use this medication? This medication is injected into a vein. It is given by your care team in a hospital or clinic setting. Talk to your care team about the use of this medication in children. While it may be given to children for selected conditions,  precautions do apply. Overdosage: If you think you have taken too much of this medicine contact a poison control center or emergency room at once. NOTE: This medicine is only for you. Do not share this medicine with others. What if I miss a dose? Keep appointments for follow-up doses. It is important not to miss your dose. Call your care team if you are unable to keep an appointment. What may interact with this medication? Do not take this medication with any of the following: Live virus vaccines Other medications may affect the way this medication works. Talk with your care team about all of the medications you take. They may suggest changes to your treatment plan to lower the risk of side effects and to make sure your medications work as intended. This list may not describe all possible interactions. Give your health care provider a list of all the medicines, herbs, non-prescription drugs, or dietary supplements you use. Also tell them if you smoke, drink alcohol, or use illegal drugs. Some items may interact with your medicine. What should I watch for while using this medication? Your condition will be monitored carefully while you are receiving this medication. You may need blood work while taking this medication. This medication may make you feel generally unwell. This is not uncommon as chemotherapy can affect healthy cells as well as cancer cells. Report any side effects. Continue your course of treatment even though you feel ill unless your care team tells you to stop. This medication can cause serious allergic reactions. To reduce the risk, your care team may give you other medications to take before receiving this one. Be sure to follow the directions from your care team. This medication may increase your risk of getting an infection. Call your care team for advice if you get a fever, chills, sore throat, or other symptoms of a cold or flu. Do not treat yourself. Try to avoid being around  people who are sick. This medication may increase your risk to bruise or bleed. Call your care team if you notice any unusual bleeding. Be careful brushing or flossing your teeth or using a toothpick because you may get an infection or bleed more easily. If you have any dental work done, tell your dentist you are receiving this medication. Talk to your care team if you may be pregnant. Serious birth defects can occur if you take this medication during pregnancy. Talk to your care team before breastfeeding. Changes to your treatment plan may be needed. What side effects may I notice from receiving this medication? Side effects that you should report to your care team as soon as possible: Allergic reactions--skin rash, itching, hives, swelling of the face, lips, tongue, or throat Heart rhythm changes--fast or irregular heartbeat, dizziness, feeling faint or lightheaded, chest pain, trouble breathing Increase in blood pressure Infection--fever, chills, cough, sore throat, wounds that don't heal, pain or trouble when passing urine, general feeling of discomfort or being unwell Low blood pressure--dizziness, feeling faint or lightheaded, blurry vision Low red blood cell level--unusual weakness or fatigue, dizziness, headache, trouble breathing Painful swelling, warmth, or redness of the skin, blisters or sores at the infusion site Pain, tingling, or numbness  in the hands or feet Slow heartbeat--dizziness, feeling faint or lightheaded, confusion, trouble breathing, unusual weakness or fatigue Unusual bruising or bleeding Side effects that usually do not require medical attention (report to your care team if they continue or are bothersome): Diarrhea Hair loss Joint pain Loss of appetite Muscle pain Nausea Vomiting This list may not describe all possible side effects. Call your doctor for medical advice about side effects. You may report side effects to FDA at 1-800-FDA-1088. Where should I keep  my medication? This medication is given in a hospital or clinic. It will not be stored at home. NOTE: This sheet is a summary. It may not cover all possible information. If you have questions about this medicine, talk to your doctor, pharmacist, or health care provider.  2023 Elsevier/Gold Standard (2021-06-10 00:00:00)  Carboplatin Injection What is this medication? CARBOPLATIN (KAR boe pla tin) treats some types of cancer. It works by slowing down the growth of cancer cells. This medicine may be used for other purposes; ask your health care provider or pharmacist if you have questions. COMMON BRAND NAME(S): Paraplatin What should I tell my care team before I take this medication? They need to know if you have any of these conditions: Blood disorders Hearing problems Kidney disease Recent or ongoing radiation therapy An unusual or allergic reaction to carboplatin, cisplatin, other medications, foods, dyes, or preservatives Pregnant or trying to get pregnant Breast-feeding How should I use this medication? This medication is injected into a vein. It is given by your care team in a hospital or clinic setting. Talk to your care team about the use of this medication in children. Special care may be needed. Overdosage: If you think you have taken too much of this medicine contact a poison control center or emergency room at once. NOTE: This medicine is only for you. Do not share this medicine with others. What if I miss a dose? Keep appointments for follow-up doses. It is important not to miss your dose. Call your care team if you are unable to keep an appointment. What may interact with this medication? Medications for seizures Some antibiotics, such as amikacin, gentamicin, neomycin, streptomycin, tobramycin Vaccines This list may not describe all possible interactions. Give your health care provider a list of all the medicines, herbs, non-prescription drugs, or dietary supplements you  use. Also tell them if you smoke, drink alcohol, or use illegal drugs. Some items may interact with your medicine. What should I watch for while using this medication? Your condition will be monitored carefully while you are receiving this medication. You may need blood work while taking this medication. This medication may make you feel generally unwell. This is not uncommon, as chemotherapy can affect healthy cells as well as cancer cells. Report any side effects. Continue your course of treatment even though you feel ill unless your care team tells you to stop. In some cases, you may be given additional medications to help with side effects. Follow all directions for their use. This medication may increase your risk of getting an infection. Call your care team for advice if you get a fever, chills, sore throat, or other symptoms of a cold or flu. Do not treat yourself. Try to avoid being around people who are sick. Avoid taking medications that contain aspirin, acetaminophen, ibuprofen, naproxen, or ketoprofen unless instructed by your care team. These medications may hide a fever. Be careful brushing or flossing your teeth or using a toothpick because you may get an  infection or bleed more easily. If you have any dental work done, tell your dentist you are receiving this medication. Talk to your care team if you wish to become pregnant or think you might be pregnant. This medication can cause serious birth defects. Talk to your care team about effective forms of contraception. Do not breast-feed while taking this medication. What side effects may I notice from receiving this medication? Side effects that you should report to your care team as soon as possible: Allergic reactions--skin rash, itching, hives, swelling of the face, lips, tongue, or throat Infection--fever, chills, cough, sore throat, wounds that don't heal, pain or trouble when passing urine, general feeling of discomfort or being  unwell Low red blood cell level--unusual weakness or fatigue, dizziness, headache, trouble breathing Pain, tingling, or numbness in the hands or feet, muscle weakness, change in vision, confusion or trouble speaking, loss of balance or coordination, trouble walking, seizures Unusual bruising or bleeding Side effects that usually do not require medical attention (report to your care team if they continue or are bothersome): Hair loss Nausea Unusual weakness or fatigue Vomiting This list may not describe all possible side effects. Call your doctor for medical advice about side effects. You may report side effects to FDA at 1-800-FDA-1088. Where should I keep my medication? This medication is given in a hospital or clinic. It will not be stored at home. NOTE: This sheet is a summary. It may not cover all possible information. If you have questions about this medicine, talk to your doctor, pharmacist, or health care provider.  2023 Elsevier/Gold Standard (2021-05-25 00:00:00)

## 2022-02-25 ENCOUNTER — Other Ambulatory Visit: Payer: Self-pay

## 2022-02-25 ENCOUNTER — Inpatient Hospital Stay: Payer: Medicare HMO

## 2022-02-25 VITALS — BP 150/74 | HR 98 | Temp 97.8°F | Resp 20

## 2022-02-25 DIAGNOSIS — C482 Malignant neoplasm of peritoneum, unspecified: Secondary | ICD-10-CM | POA: Diagnosis not present

## 2022-02-25 DIAGNOSIS — C481 Malignant neoplasm of specified parts of peritoneum: Secondary | ICD-10-CM

## 2022-02-25 DIAGNOSIS — Z5111 Encounter for antineoplastic chemotherapy: Secondary | ICD-10-CM | POA: Diagnosis not present

## 2022-02-25 MED ORDER — PEGFILGRASTIM-JMDB 6 MG/0.6ML ~~LOC~~ SOSY
6.0000 mg | PREFILLED_SYRINGE | Freq: Once | SUBCUTANEOUS | Status: AC
  Administered 2022-02-25: 6 mg via SUBCUTANEOUS
  Filled 2022-02-25: qty 0.6

## 2022-02-25 NOTE — Patient Instructions (Signed)

## 2022-02-26 ENCOUNTER — Other Ambulatory Visit: Payer: Self-pay

## 2022-03-03 DIAGNOSIS — C786 Secondary malignant neoplasm of retroperitoneum and peritoneum: Secondary | ICD-10-CM | POA: Diagnosis not present

## 2022-03-03 DIAGNOSIS — Z888 Allergy status to other drugs, medicaments and biological substances status: Secondary | ICD-10-CM | POA: Diagnosis not present

## 2022-03-03 DIAGNOSIS — C482 Malignant neoplasm of peritoneum, unspecified: Secondary | ICD-10-CM | POA: Diagnosis not present

## 2022-03-04 ENCOUNTER — Other Ambulatory Visit: Payer: Self-pay

## 2022-03-05 ENCOUNTER — Encounter: Payer: Self-pay | Admitting: Nurse Practitioner

## 2022-03-05 ENCOUNTER — Encounter: Payer: Self-pay | Admitting: *Deleted

## 2022-03-05 DIAGNOSIS — Z932 Ileostomy status: Secondary | ICD-10-CM | POA: Diagnosis not present

## 2022-03-15 ENCOUNTER — Other Ambulatory Visit: Payer: Self-pay | Admitting: *Deleted

## 2022-03-15 ENCOUNTER — Inpatient Hospital Stay: Payer: Medicare HMO

## 2022-03-15 ENCOUNTER — Inpatient Hospital Stay: Payer: Medicare HMO | Admitting: Oncology

## 2022-03-15 ENCOUNTER — Encounter: Payer: Self-pay | Admitting: *Deleted

## 2022-03-15 ENCOUNTER — Telehealth: Payer: Self-pay

## 2022-03-15 VITALS — BP 153/82 | HR 100 | Temp 98.1°F | Resp 18 | Ht 59.0 in | Wt 118.2 lb

## 2022-03-15 DIAGNOSIS — C481 Malignant neoplasm of specified parts of peritoneum: Secondary | ICD-10-CM

## 2022-03-15 DIAGNOSIS — C482 Malignant neoplasm of peritoneum, unspecified: Secondary | ICD-10-CM | POA: Diagnosis not present

## 2022-03-15 DIAGNOSIS — Z95828 Presence of other vascular implants and grafts: Secondary | ICD-10-CM | POA: Diagnosis not present

## 2022-03-15 DIAGNOSIS — Z5111 Encounter for antineoplastic chemotherapy: Secondary | ICD-10-CM | POA: Diagnosis not present

## 2022-03-15 LAB — CMP (CANCER CENTER ONLY)
ALT: 15 U/L (ref 0–44)
AST: 20 U/L (ref 15–41)
Albumin: 4.2 g/dL (ref 3.5–5.0)
Alkaline Phosphatase: 78 U/L (ref 38–126)
Anion gap: 12 (ref 5–15)
BUN: 17 mg/dL (ref 8–23)
CO2: 23 mmol/L (ref 22–32)
Calcium: 9.6 mg/dL (ref 8.9–10.3)
Chloride: 105 mmol/L (ref 98–111)
Creatinine: 0.52 mg/dL (ref 0.44–1.00)
GFR, Estimated: >60 mL/min (ref >60)
Glucose, Bld: 96 mg/dL (ref 70–99)
Potassium: 4.2 mmol/L (ref 3.5–5.1)
Sodium: 140 mmol/L (ref 135–145)
Total Bilirubin: 0.3 mg/dL (ref 0.3–1.2)
Total Protein: 7 g/dL (ref 6.5–8.1)

## 2022-03-15 LAB — CBC WITH DIFFERENTIAL (CANCER CENTER ONLY)
Abs Immature Granulocytes: 0.01 10*3/uL (ref 0.00–0.07)
Basophils Absolute: 0.1 10*3/uL (ref 0.0–0.1)
Basophils Relative: 2 %
Eosinophils Absolute: 0.1 10*3/uL (ref 0.0–0.5)
Eosinophils Relative: 2 %
HCT: 37.7 % (ref 36.0–46.0)
Hemoglobin: 12.3 g/dL (ref 12.0–15.0)
Immature Granulocytes: 0 %
Lymphocytes Relative: 20 %
Lymphs Abs: 1 10*3/uL (ref 0.7–4.0)
MCH: 36 pg — ABNORMAL HIGH (ref 26.0–34.0)
MCHC: 32.6 g/dL (ref 30.0–36.0)
MCV: 110.2 fL — ABNORMAL HIGH (ref 80.0–100.0)
Monocytes Absolute: 0.6 10*3/uL (ref 0.1–1.0)
Monocytes Relative: 14 %
Neutro Abs: 2.9 10*3/uL (ref 1.7–7.7)
Neutrophils Relative %: 62 %
Platelet Count: 304 10*3/uL (ref 150–400)
RBC: 3.42 MIL/uL — ABNORMAL LOW (ref 3.87–5.11)
RDW: 13.4 % (ref 11.5–15.5)
WBC Count: 4.7 10*3/uL (ref 4.0–10.5)
nRBC: 0 % (ref 0.0–0.2)

## 2022-03-15 MED ORDER — SODIUM CHLORIDE 0.9% FLUSH
10.0000 mL | INTRAVENOUS | Status: DC | PRN
  Administered 2022-03-15: 10 mL via INTRAVENOUS

## 2022-03-15 MED ORDER — HEPARIN SOD (PORK) LOCK FLUSH 100 UNIT/ML IV SOLN
500.0000 [IU] | Freq: Once | INTRAVENOUS | Status: AC
  Administered 2022-03-15: 500 [IU] via INTRAVENOUS

## 2022-03-15 NOTE — Progress Notes (Signed)
Referral placed to [facility] and faxed over to [phone]

## 2022-03-15 NOTE — Progress Notes (Signed)
[facility] OFFICE PROGRESS NOTE   Diagnosis: Primary peritoneal cancer  INTERVAL HISTORY:   Nancy Harrison returns as scheduled.  She completed cycle 6 Taxol/carboplatin 02/24/2022.  No nausea/vomiting or neuropathy symptoms.  She empties the ileostomy bag 3-4 times per day.  She takes Imodium and Lomotil. She saw Dr. Clovis Riley on 03/03/2022.  He does not recommend cytoreductive surgery or reversal of the ileostomy.  He feels she is a poor surgical candidate.  Objective:  Vital signs in last 24 hours:  Blood pressure (!) 153/82, pulse 100, temperature 98.1 F (36.7 C), temperature source Oral, resp. rate 18, height '4\' 11"'$  (1.499 m), weight 118 lb 3.2 oz (53.6 kg), SpO2 98 %.    HEENT: No thrush or ulcers Resp: Lungs clear bilaterally Cardio: Regular rate and rhythm GI: Right abdomen ileostomy, no mass, no apparent ascites, nontender Vascular: No leg edema    Portacath/PICC-without erythema  Lab Results:  Lab Results  Component Value Date   WBC 4.7 03/15/2022   HGB 12.3 03/15/2022   HCT 37.7 03/15/2022   MCV 110.2 (H) 03/15/2022   PLT 304 03/15/2022   NEUTROABS 2.9 03/15/2022    CMP  Lab Results  Component Value Date   NA 139 02/23/2022   K 4.0 02/23/2022   CL 104 02/23/2022   CO2 27 02/23/2022   GLUCOSE 139 (H) 02/23/2022   BUN 21 02/23/2022   CREATININE 0.46 02/23/2022   CALCIUM 9.9 02/23/2022   PROT 7.1 02/23/2022   ALBUMIN 4.2 02/23/2022   AST 23 02/23/2022   ALT 15 02/23/2022   ALKPHOS 83 02/23/2022   BILITOT 0.3 02/23/2022   GFRNONAA >60 02/23/2022    Lab Results  Component Value Date   CEA1 2.5 10/18/2021    Medications: I have reviewed the patient's current medications.   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  Bone lesions concerning for metastases, right lung nodule Laparoscopy, diverting loop ileostomy, gastrostomy tube placement, peritoneal biopsy 10/16/2021, no evidence of a primary tumor site at the appendix, ovaries, or uterus.  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  CDX2, CK20, and GATA3 negative.  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 Foundation 1-MSS, tumor mutation burden 5, HRD positive-LOH score 34.1% 08/11/2021 CA125 818 CT chest 11/03/2021-10 mm right lower lobe nodule, scattered tiny pulmonary nodules, peritoneal carcinomatosis Cycle 1 Taxol/carboplatin 11/12/2021 Cycle 2 Taxol/carboplatin 12/03/2021 Cycle 3 Taxol/carboplatin 12/24/2021 Bone scan 01/05/2022-negative for metastatic disease Cycle 4 Taxol/carboplatin 01/13/2022 CTs 01/22/2022-reduction in peritoneal carcinomatosis, no evidence of progressive disease stable bilateral lower lobe pulmonary nodules Cycle 5 Taxol/carboplatin 02/03/2022 Cycle 6 Taxol/carboplatin 02/24/2022 2.   Abdominal distention/pain and diarrhea secondary to #1 3.   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  Lumpectomy and adjuvant CMF x8 followed by radiation 4. Hypertension 5. Hypothyroidism 6. Barrett's esophagus 7. Family history of multiple cancers including appendix, colon, and lung cancer 8.  Hospital admission 11/05/2021 with dehydration/prerenal azotemia secondary to nausea and high output ileostomy 9.  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  She has completed 6 cycles of Taxol/carboplatin.  The CA125  is normal.  She saw Dr. Clovis Riley.  He feels she is not a candidate for cytoreductive surgery or reversal of the ileostomy. I discussed treatment options with Nancy Harrison including continuing Taxol/carboplatin, observation, and switching to a maintenance regimen.  She is comfortable with observation.  We will plan for a restaging CT in approximately 4 weeks.  She will return for an office visit after the restaging CT evaluation.  [name], MD  03/15/2022  10:52 AM

## 2022-03-15 NOTE — Telephone Encounter (Signed)
Patient called and states she needs a script for bladder spasm, she is not sure the name. I left a reminder for Dr Benay Spice on his desk to send in the script.

## 2022-03-16 ENCOUNTER — Other Ambulatory Visit: Payer: Self-pay

## 2022-03-16 ENCOUNTER — Other Ambulatory Visit: Payer: Self-pay | Admitting: *Deleted

## 2022-03-16 LAB — CA 125: Cancer Antigen (CA) 125: 22.8 U/mL (ref 0.0–38.1)

## 2022-03-16 MED ORDER — METHOCARBAMOL 500 MG PO TABS: 500.0000 mg | ORAL_TABLET | Freq: Three times a day (TID) | ORAL | 1 refills | Status: DC | PRN

## 2022-03-16 NOTE — Telephone Encounter (Signed)
Requesting refill on methocarbamol. OK per Dr. Benay Spice

## 2022-03-17 ENCOUNTER — Other Ambulatory Visit: Payer: Self-pay

## 2022-03-26 ENCOUNTER — Ambulatory Visit (HOSPITAL_COMMUNITY)
Admission: RE | Admit: 2022-03-26 | Discharge: 2022-03-26 | Disposition: A | Discharge status: Home / Self Care | Payer: Medicare HMO | Source: Ambulatory Visit | Attending: Nurse Practitioner | Admitting: Nurse Practitioner

## 2022-03-26 DIAGNOSIS — K9419 Other complications of enterostomy: Secondary | ICD-10-CM

## 2022-03-26 DIAGNOSIS — Z932 Ileostomy status: Secondary | ICD-10-CM | POA: Diagnosis not present

## 2022-03-26 DIAGNOSIS — L24B3 Irritant contact dermatitis related to fecal or urinary stoma or fistula: Secondary | ICD-10-CM

## 2022-03-26 NOTE — Progress Notes (Signed)
[facility]   Reason for visit:  RUQ loop ileostomy  patient care goals no longer include reversal of ostomy  spouse and patient present for visit today.   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   Cataract    Family history of colon cancer    Gastrostomy in place [facility])    LUQ - placed 10/16/2021   Hypertension    Ileostomy, has currently ([place])    placed 09/2021   SUI (stress urinary incontinence, female)    Thyroid disease    Urinary urgency    Family History  Problem Relation Age of Onset   Colon cancer Mother 76   Lung cancer Sister    Cancer Maternal Aunt        NOS   Cancer Daughter        appendiceal   Stomach cancer Neg Hx    Rectal cancer Neg Hx    Esophageal cancer Neg Hx    Allergies  Allergen Reactions   Sporanox [Itraconazole] Rash   Current Outpatient Medications  Medication Sig Dispense Refill Last Dose   acetaminophen (TYLENOL) 325 MG tablet Take 2 tablets (650 mg total) by mouth every 6 (six) hours as needed for mild pain or fever.      diphenoxylate-atropine (LOMOTIL) 2.5-0.025 MG tablet TAKE ONE TABLET BY MOUTH EVERY 12 HOURS AS NEEDED FOR LOOSE STOOLS 60 tablet 1    ibuprofen (ADVIL) 200 MG tablet Take 400 mg by mouth every 6 (six) hours as needed for moderate pain.      lisinopril (ZESTRIL) 10 MG tablet Take 10 mg by mouth daily.      loperamide (IMODIUM A-D) 2 MG tablet Take 2 tablets (4 mg total) by mouth 4 (four) times daily as needed for diarrhea or loose stools. (Patient taking differently: Take 4 mg by mouth in the morning, at noon, and at bedtime.) 60 tablet 2    loratadine (CLARITIN) 10 MG tablet Take 10 mg by mouth 2 (two) times daily.      MAGnesium-Oxide 400 (240 Mg) MG tablet TAKE 1 TABLET BY MOUTH TWICE A DAY 60 tablet 0    melatonin 5 MG TABS Take 5 mg by mouth  at bedtime.      methocarbamol (ROBAXIN) 500 MG tablet Take 1 tablet (500 mg total) by mouth every 8 (eight) hours as needed for muscle spasms. 30 tablet 1    Multiple Vitamin (MULTIVITAMIN PO) Take 1 tablet by mouth daily with breakfast.      potassium chloride (MICRO-K) 10 MEQ CR capsule TAKE 1 CAPSULE BY MOUTH TWICE A DAY 60 capsule 1    triamcinolone cream (KENALOG) 0.1 % Apply 1 Application topically daily as needed (rash). (Patient not taking: Reported on 02/23/2022)      Current Facility-Administered Medications  Medication Dose Route Frequency Provider Last Rate Last Admin   0.9 %  sodium chloride infusion  500 mL Intravenous Continuous Nandigam, Venia Minks, MD       Facility-Administered Medications Ordered in Other Encounters  Medication Dose Route Frequency Provider Last Rate Last Admin   0.9 %  sodium chloride infusion   Intravenous Continuous Owens Shark, NP       0.9 %  sodium chloride infusion   Intravenous Once Ladell Pier,  MD       dexamethasone (DECADRON) 10 mg in sodium chloride 0.9 % 50 mL IVPB  10 mg Intravenous Once Ladell Pier, MD       ROS  Review of Systems  Constitutional:  Positive for fatigue.  Gastrointestinal:        RUQ ileostomy Parastomal hernia  Musculoskeletal:  Positive for arthralgias.  Skin:  Positive for rash.  Psychiatric/Behavioral: Negative.    All other systems reviewed and are negative.  Vital signs:  BP (!) 157/85 (BP Location: Right Arm)   Pulse (!) 105   Temp (!) 97.4 F (36.3 C) (Oral)   Resp 18   SpO2 100%  Exam:  Physical Exam Vitals reviewed.  Constitutional:      Appearance: Normal appearance.  Abdominal:     Palpations: Abdomen is soft.     Hernia: A hernia is present.  Skin:    Findings: Erythema and rash present.  Neurological:     Mental Status: She is alert and oriented to person, place, and time.  Psychiatric:        Mood and Affect: Mood normal.        Behavior: Behavior normal.     Stoma  type/location:  1 1/2" round pink and moist  RUQ  Stomal assessment/size:  1 1/2" flush Peristomal assessment:  denuded on bottom half   creasing at 3 and 9 o'clock with parastomal hernia present.   Treatment options for stomal/peristomal skin: stoma powder and skin prep to skin, barrier ring and 1 piece convex pouch Output: liquid green stool.  Takes Immodium and lomotil as needed.  Ostomy pouching: 1pc. convex Education provided:  we perform pouch change.  Patient is established with a DME for supplies.  Patient placed in hernia support belt and opening cut to fit.  Patient feels more supported.     Impression/dx  ileostomy Discussion  Sere back in 2 weeks Plan  Add hernia support belt.      Visit time: 45 minutes.   Domenic Moras FNP-BC

## 2022-03-30 ENCOUNTER — Other Ambulatory Visit: Payer: Self-pay | Admitting: Oncology

## 2022-03-30 ENCOUNTER — Other Ambulatory Visit: Payer: Self-pay | Admitting: *Deleted

## 2022-03-30 DIAGNOSIS — K9419 Other complications of enterostomy: Secondary | ICD-10-CM | POA: Insufficient documentation

## 2022-04-02 ENCOUNTER — Ambulatory Visit (HOSPITAL_COMMUNITY)
Admission: RE | Admit: 2022-04-02 | Discharge: 2022-04-02 | Disposition: A | Discharge status: Home / Self Care | Payer: Medicare HMO | Source: Ambulatory Visit | Attending: Nurse Practitioner | Admitting: Nurse Practitioner

## 2022-04-02 DIAGNOSIS — L24B3 Irritant contact dermatitis related to fecal or urinary stoma or fistula: Secondary | ICD-10-CM | POA: Diagnosis not present

## 2022-04-02 DIAGNOSIS — K435 Parastomal hernia without obstruction or  gangrene: Secondary | ICD-10-CM | POA: Insufficient documentation

## 2022-04-02 DIAGNOSIS — K4020 Bilateral inguinal hernia, without obstruction or gangrene, not specified as recurrent: Secondary | ICD-10-CM | POA: Diagnosis not present

## 2022-04-02 DIAGNOSIS — Z432 Encounter for attention to ileostomy: Secondary | ICD-10-CM | POA: Insufficient documentation

## 2022-04-02 NOTE — Progress Notes (Signed)
[facility]   Reason for visit:  RUQ loop ileostomy.  Mio flip pouch for rounded abdomen and hernia did not fit well, patient reports.  Has been experiencing ongoing leaks. Is still wearing hernia support belt provided at last visit.  That has helped the weight of the hernia.  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   Cataract    Family history of colon cancer    Gastrostomy in place [facility])    LUQ - placed 10/16/2021   Hypertension    Ileostomy, has currently ([place])    placed 09/2021   SUI (stress urinary incontinence, female)    Thyroid disease    Urinary urgency    Family History  Problem Relation Age of Onset   Colon cancer Mother 59   Lung cancer Sister    Cancer Maternal Aunt        NOS   Cancer Daughter        appendiceal   Stomach cancer Neg Hx    Rectal cancer Neg Hx    Esophageal cancer Neg Hx    Allergies  Allergen Reactions   Sporanox [Itraconazole] Rash   Current Outpatient Medications  Medication Sig Dispense Refill Last Dose   acetaminophen (TYLENOL) 325 MG tablet Take 2 tablets (650 mg total) by mouth every 6 (six) hours as needed for mild pain or fever.      diphenoxylate-atropine (LOMOTIL) 2.5-0.025 MG tablet TAKE ONE TABLET BY MOUTH EVERY 12 HOURS AS NEEDED FOR LOOSE STOOLS 60 tablet 1    ibuprofen (ADVIL) 200 MG tablet Take 400 mg by mouth every 6 (six) hours as needed for moderate pain.      lisinopril (ZESTRIL) 10 MG tablet Take 10 mg by mouth daily.      loperamide (IMODIUM A-D) 2 MG tablet Take 2 tablets (4 mg total) by mouth 4 (four) times daily as needed for diarrhea or loose stools. (Patient taking differently: Take 4 mg by mouth in the morning, at noon, and at bedtime.) 60 tablet 2    loratadine (CLARITIN) 10 MG tablet Take 10 mg by mouth 2 (two) times daily.       MAGnesium-Oxide 400 (240 Mg) MG tablet TAKE 1 TABLET BY MOUTH TWICE A DAY 60 tablet 0    melatonin 5 MG TABS Take 5 mg by mouth at bedtime.      methocarbamol (ROBAXIN) 500 MG tablet Take 1 tablet (500 mg total) by mouth every 8 (eight) hours as needed for muscle spasms. 30 tablet 1    Multiple Vitamin (MULTIVITAMIN PO) Take 1 tablet by mouth daily with breakfast.      potassium chloride (MICRO-K) 10 MEQ CR capsule TAKE 1 CAPSULE BY MOUTH TWICE A DAY 60 capsule 1    triamcinolone cream (KENALOG) 0.1 % Apply 1 Application topically daily as needed (rash). (Patient not taking: Reported on 02/23/2022)      Current Facility-Administered Medications  Medication Dose Route Frequency Provider Last Rate Last Admin   0.9 %  sodium chloride infusion  500 mL Intravenous Continuous Nandigam, Venia Minks, MD       Facility-Administered Medications Ordered in Other Encounters  Medication Dose Route Frequency Provider Last Rate Last Admin   0.9 %  sodium chloride infusion   Intravenous Continuous Ned Card  K, NP       0.9 %  sodium chloride infusion   Intravenous Once Ladell Pier, MD       dexamethasone (DECADRON) 10 mg in sodium chloride 0.9 % 50 mL IVPB  10 mg Intravenous Once Ladell Pier, MD       ROS  Review of Systems  Constitutional:  Positive for fatigue.  Gastrointestinal:  Positive for abdominal pain and nausea.       RLQ ileostomy Hernia   Psychiatric/Behavioral: Negative.    All other systems reviewed and are negative.  Vital signs:  BP (!) 169/91 (BP Location: Right Arm)   Pulse 100   Temp (!) 97.5 F (36.4 C) (Oral)   Resp 20   SpO2 98%  Exam:  Physical Exam Vitals reviewed.  Constitutional:      Appearance: Normal appearance.  Abdominal:     Palpations: Abdomen is soft.     Hernia: A hernia is present.  Skin:    General: Skin is warm and dry.     Findings: Erythema and rash present.     Comments: Peristomal breakdown  Neurological:     Mental Status: She is  alert and oriented to person, place, and time.  Psychiatric:        Behavior: Behavior normal.     Comments: Is discouraged with leaking stoma. Emotional support that we will keep trying interventions to find the best pouching solution.     Stoma type/location:  RLQ ileostomy Stomal assessment/size:  1 1/2" flush stoma Peristomal assessment:  ongoing contact dermatitis from leaking Treatment options for stomal/peristomal skin: stoma powder and skin prep Output: liquid green stool Ostomy pouching: 1pc. Convex  has switched back to Hollister flexible convex  Education provided:  Pouch change performed  Back into hollister pouch  and hernia belt. Adding barrier strips to perimeter of pouch.      Impression/dx  Ileostomy Contact dermatitis Parastomal hernia Discussion  See above  complex pouching situation due to hernia and large bulge in stomal plane.  Plan  Barrier strips to perimeter of barrier.  Hernia support belt.  Double barrier rings around stoma to increase adhesion.     Visit time: 45 minutes.   Domenic Moras FNP-BC

## 2022-04-06 DIAGNOSIS — K4020 Bilateral inguinal hernia, without obstruction or gangrene, not specified as recurrent: Secondary | ICD-10-CM | POA: Insufficient documentation

## 2022-04-06 NOTE — Discharge Instructions (Signed)
Barrier strips to perimeter of pouch Double barrier rings around stoma

## 2022-04-09 ENCOUNTER — Encounter: Payer: Self-pay | Admitting: Gynecologic Oncology

## 2022-04-12 ENCOUNTER — Other Ambulatory Visit: Payer: Medicare HMO

## 2022-04-12 ENCOUNTER — Ambulatory Visit (HOSPITAL_COMMUNITY)
Admission: RE | Admit: 2022-04-12 | Discharge: 2022-04-12 | Disposition: A | Discharge status: Home / Self Care | Payer: Medicare HMO | Source: Ambulatory Visit | Attending: Family Medicine | Admitting: Family Medicine

## 2022-04-12 DIAGNOSIS — L24B3 Irritant contact dermatitis related to fecal or urinary stoma or fistula: Secondary | ICD-10-CM

## 2022-04-12 DIAGNOSIS — K4090 Unilateral inguinal hernia, without obstruction or gangrene, not specified as recurrent: Secondary | ICD-10-CM

## 2022-04-12 DIAGNOSIS — Z432 Encounter for attention to ileostomy: Secondary | ICD-10-CM | POA: Insufficient documentation

## 2022-04-12 DIAGNOSIS — K9413 Enterostomy malfunction: Secondary | ICD-10-CM

## 2022-04-12 DIAGNOSIS — K435 Parastomal hernia without obstruction or  gangrene: Secondary | ICD-10-CM | POA: Diagnosis not present

## 2022-04-12 NOTE — Progress Notes (Signed)
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   Cataract    Family history of colon cancer    Gastrostomy in place [facility])    LUQ - placed 10/16/2021   Hypertension    Ileostomy, has currently ([place])    placed 09/2021   SUI (stress urinary incontinence, female)    Thyroid disease    Urinary urgency    Family History  Problem Relation Age of Onset   Colon cancer Mother 67   Lung cancer Sister    Cancer Maternal Aunt        NOS   Cancer Daughter        appendiceal   Stomach cancer Neg Hx    Rectal cancer Neg Hx    Esophageal cancer Neg Hx    Allergies  Allergen Reactions   Sporanox [Itraconazole] Rash   Current Outpatient Medications  Medication Sig Dispense Refill Last Dose   acetaminophen (TYLENOL) 325 MG tablet Take 2 tablets (650 mg total) by mouth every 6 (six) hours as needed for mild pain or fever.      diphenoxylate-atropine (LOMOTIL) 2.5-0.025 MG tablet TAKE ONE TABLET BY MOUTH EVERY 12 HOURS AS NEEDED FOR LOOSE STOOLS 60 tablet 1    ibuprofen (ADVIL) 200 MG tablet Take 400 mg by mouth every 6 (six) hours as needed for moderate pain.      lisinopril (ZESTRIL) 10 MG tablet Take 10 mg by mouth daily.      loperamide (IMODIUM A-D) 2 MG tablet Take 2 tablets (4 mg total) by mouth 4 (four) times daily as needed for diarrhea or loose stools. (Patient taking differently: Take 4 mg by mouth in the morning, at noon, and at bedtime.) 60 tablet 2    loratadine (CLARITIN) 10 MG tablet Take 10 mg by mouth 2 (two) times daily.      MAGnesium-Oxide 400 (240 Mg) MG tablet TAKE 1 TABLET BY MOUTH TWICE A DAY 60 tablet 0    melatonin 5 MG TABS Take 5 mg by mouth at bedtime.      methocarbamol (ROBAXIN)  500 MG tablet Take 1 tablet (500 mg total) by mouth every 8 (eight) hours as needed for muscle spasms. 30 tablet 1    Multiple Vitamin (MULTIVITAMIN PO) Take 1 tablet by mouth daily with breakfast.      potassium chloride (MICRO-K) 10 MEQ CR capsule TAKE 1 CAPSULE BY MOUTH TWICE A DAY 60 capsule 1    triamcinolone cream (KENALOG) 0.1 % Apply 1 Application topically daily as needed (rash). (Patient not taking: Reported on 02/23/2022)      Current Facility-Administered Medications  Medication Dose Route Frequency Provider Last Rate Last Admin   0.9 %  sodium chloride infusion  500 mL Intravenous Continuous Nandigam, Kavitha V, MD       Facility-Administered Medications Ordered in Other Encounters  Medication Dose Route Frequency Provider Last Rate Last Admin   0.9 %  sodium chloride infusion   Intravenous Continuous Owens Shark, NP       0.9 %  sodium chloride infusion   Intravenous Once Ladell Pier, MD       dexamethasone (DECADRON) 10 mg in sodium  chloride 0.9 % 50 mL IVPB  10 mg Intravenous Once Ladell Pier, MD       ROS  Review of Systems  Gastrointestinal:        Increase in abdominal girth due to parastomal hernia RLQ ileostomy  Skin:  Positive for rash and wound.       Peristomal breakdown  Psychiatric/Behavioral:         Concerned about change in hernia size/appearance  All other systems reviewed and are negative.  Vital signs:  There were no vitals taken for this visit. Exam:  Physical Exam Constitutional:      Appearance: Normal appearance.  Abdominal:     Palpations: Abdomen is soft.     Hernia: A hernia is present.  Skin:    General: Skin is warm and dry.     Findings: Erythema and rash present.  Neurological:     Mental Status: She is alert and oriented to person, place, and time.  Psychiatric:        Mood and Affect: Mood normal.        Behavior: Behavior normal.     Stoma type/location:  RUQ ileostomy with large parastomal hernia.  Has dramatically  increased in the past 2 days, per spouse  continues to make stool Stomal assessment/size:   Peristomal assessment:  1 1/2" flush stoma, herniated mucosa is present circumferentially, increasing diameter of stomal base to 2 1/4"  Treatment options for stomal/peristomal skin: Double barrier rings around stoma and 1 piece convex hollister pouch.  Continues wearing hernia ostomy support belt.  Spouse is interested in a belt he has seen online with a rigid plastic opening for the pouch. I am unable to find the particular belt online or in the [employer].  He will try and find it again and let me know. In the meantime, he is going to try and order it.  Output: soft brown stool Ostomy pouching: 1pc.convex with 2 barrier rings  Education provided:  spouse to let me know if he needs assistance ordering another style belt.     Impression/dx  Ileostomy Parastomal hernia Discussion  See above Follow up with surgery related to hernia concerns.  Reassured that stoma is functioning and producing stool.  Plan  See above   See back as needed    Visit time: 45 minutes.   Domenic Moras FNP-BC

## 2022-04-13 ENCOUNTER — Ambulatory Visit: Payer: Medicare HMO | Admitting: Oncology

## 2022-04-13 DIAGNOSIS — Z932 Ileostomy status: Secondary | ICD-10-CM | POA: Diagnosis not present

## 2022-04-13 DIAGNOSIS — K4090 Unilateral inguinal hernia, without obstruction or gangrene, not specified as recurrent: Secondary | ICD-10-CM | POA: Insufficient documentation

## 2022-04-13 DIAGNOSIS — K9413 Enterostomy malfunction: Secondary | ICD-10-CM | POA: Insufficient documentation

## 2022-04-13 NOTE — Discharge Instructions (Signed)
Follow up with brand/style rigid hernia support belt  Let me know if you need help ordering from [employer] Follow up with surgery related to hernia changes

## 2022-04-14 DIAGNOSIS — Z932 Ileostomy status: Secondary | ICD-10-CM | POA: Diagnosis not present

## 2022-04-19 ENCOUNTER — Ambulatory Visit (HOSPITAL_BASED_OUTPATIENT_CLINIC_OR_DEPARTMENT_OTHER)
Admission: RE | Admit: 2022-04-19 | Discharge: 2022-04-19 | Disposition: A | Discharge status: Home / Self Care | Payer: Medicare HMO | Source: Ambulatory Visit | Attending: Oncology | Admitting: Oncology

## 2022-04-19 ENCOUNTER — Encounter (HOSPITAL_BASED_OUTPATIENT_CLINIC_OR_DEPARTMENT_OTHER): Payer: Self-pay

## 2022-04-19 ENCOUNTER — Inpatient Hospital Stay: Payer: Medicare HMO

## 2022-04-19 ENCOUNTER — Inpatient Hospital Stay: Payer: Medicare HMO | Attending: Oncology

## 2022-04-19 DIAGNOSIS — C482 Malignant neoplasm of peritoneum, unspecified: Secondary | ICD-10-CM | POA: Diagnosis not present

## 2022-04-19 DIAGNOSIS — N200 Calculus of kidney: Secondary | ICD-10-CM | POA: Diagnosis not present

## 2022-04-19 DIAGNOSIS — H524 Presbyopia: Secondary | ICD-10-CM | POA: Diagnosis not present

## 2022-04-19 DIAGNOSIS — Z961 Presence of intraocular lens: Secondary | ICD-10-CM | POA: Diagnosis not present

## 2022-04-19 DIAGNOSIS — C481 Malignant neoplasm of specified parts of peritoneum: Secondary | ICD-10-CM | POA: Insufficient documentation

## 2022-04-19 DIAGNOSIS — H5203 Hypermetropia, bilateral: Secondary | ICD-10-CM | POA: Diagnosis not present

## 2022-04-19 DIAGNOSIS — H52223 Regular astigmatism, bilateral: Secondary | ICD-10-CM | POA: Diagnosis not present

## 2022-04-19 DIAGNOSIS — Z135 Encounter for screening for eye and ear disorders: Secondary | ICD-10-CM | POA: Diagnosis not present

## 2022-04-19 LAB — CMP (CANCER CENTER ONLY)
ALT: 15 U/L (ref 0–44)
AST: 21 U/L (ref 15–41)
Albumin: 4.2 g/dL (ref 3.5–5.0)
Alkaline Phosphatase: 60 U/L (ref 38–126)
Anion gap: 6 (ref 5–15)
BUN: 20 mg/dL (ref 8–23)
CO2: 28 mmol/L (ref 22–32)
Calcium: 9.6 mg/dL (ref 8.9–10.3)
Chloride: 105 mmol/L (ref 98–111)
Creatinine: 0.54 mg/dL (ref 0.44–1.00)
GFR, Estimated: >60 mL/min (ref >60)
Glucose, Bld: 128 mg/dL — ABNORMAL HIGH (ref 70–99)
Potassium: 4 mmol/L (ref 3.5–5.1)
Sodium: 139 mmol/L (ref 135–145)
Total Bilirubin: 0.7 mg/dL (ref 0.3–1.2)
Total Protein: 6.9 g/dL (ref 6.5–8.1)

## 2022-04-19 LAB — CBC WITH DIFFERENTIAL (CANCER CENTER ONLY)
Abs Immature Granulocytes: 0.01 10*3/uL (ref 0.00–0.07)
Basophils Absolute: 0 10*3/uL (ref 0.0–0.1)
Basophils Relative: 1 %
Eosinophils Absolute: 0.2 10*3/uL (ref 0.0–0.5)
Eosinophils Relative: 2 %
HCT: 35.4 % — ABNORMAL LOW (ref 36.0–46.0)
Hemoglobin: 11.7 g/dL — ABNORMAL LOW (ref 12.0–15.0)
Immature Granulocytes: 0 %
Lymphocytes Relative: 19 %
Lymphs Abs: 1.3 10*3/uL (ref 0.7–4.0)
MCH: 35.6 pg — ABNORMAL HIGH (ref 26.0–34.0)
MCHC: 33.1 g/dL (ref 30.0–36.0)
MCV: 107.6 fL — ABNORMAL HIGH (ref 80.0–100.0)
Monocytes Absolute: 0.6 10*3/uL (ref 0.1–1.0)
Monocytes Relative: 8 %
Neutro Abs: 4.7 10*3/uL (ref 1.7–7.7)
Neutrophils Relative %: 70 %
Platelet Count: 218 10*3/uL (ref 150–400)
RBC: 3.29 MIL/uL — ABNORMAL LOW (ref 3.87–5.11)
RDW: 11.5 % (ref 11.5–15.5)
WBC Count: 6.7 10*3/uL (ref 4.0–10.5)
nRBC: 0 % (ref 0.0–0.2)

## 2022-04-19 LAB — MAGNESIUM: Magnesium: 1.8 mg/dL (ref 1.7–2.4)

## 2022-04-19 MED ORDER — IOHEXOL 300 MG/ML  SOLN
75.0000 mL | Freq: Once | INTRAMUSCULAR | Status: AC | PRN
  Administered 2022-04-19: 75 mL via INTRAVENOUS

## 2022-04-19 NOTE — Patient Instructions (Signed)

## 2022-04-20 ENCOUNTER — Encounter: Payer: Self-pay | Admitting: Oncology

## 2022-04-20 LAB — CA 125: Cancer Antigen (CA) 125: 18.1 U/mL (ref 0.0–38.1)

## 2022-04-21 ENCOUNTER — Inpatient Hospital Stay: Payer: Medicare HMO | Admitting: Oncology

## 2022-04-21 VITALS — BP 142/90 | HR 100 | Temp 98.2°F | Resp 18 | Ht 59.0 in | Wt 119.0 lb

## 2022-04-21 DIAGNOSIS — C481 Malignant neoplasm of specified parts of peritoneum: Secondary | ICD-10-CM | POA: Diagnosis not present

## 2022-04-21 NOTE — Progress Notes (Signed)
Nancy Harrison   Diagnosis: Peritoneal carcinoma  INTERVAL HISTORY:   Nancy Harrison returns as scheduled.  She generally feels well.  The colostomy is functioning.  She has developed a parastomal hernia.  She is using a hernia band.  She believes this has caused a rash in the left groin.  She is here today with her husband.  Objective:  Vital signs in last 24 hours:  Blood pressure (!) 142/90, pulse 100, temperature 98.2 F (36.8 C), temperature source Oral, resp. rate 18, height '4\' 11"'$  (1.499 m), weight 119 lb (54 kg), SpO2 98 %.     Resp: Lungs clear bilaterally Cardio: Regular rate and rhythm GI: No mass, nontender, right abdomen colostomy, there appears to be a moderate parastomal hernia Vascular: No leg edema  Skin: Yeast rash in the left groin and pannus  Portacath/PICC-without erythema  Lab Results:  Lab Results  Component Value Date   WBC 6.7 04/19/2022   HGB 11.7 (L) 04/19/2022   HCT 35.4 (L) 04/19/2022   MCV 107.6 (H) 04/19/2022   PLT 218 04/19/2022   NEUTROABS 4.7 04/19/2022    CMP  Lab Results  Component Value Date   NA 139 04/19/2022   K 4.0 04/19/2022   CL 105 04/19/2022   CO2 28 04/19/2022   GLUCOSE 128 (H) 04/19/2022   BUN 20 04/19/2022   CREATININE 0.54 04/19/2022   CALCIUM 9.6 04/19/2022   PROT 6.9 04/19/2022   ALBUMIN 4.2 04/19/2022   AST 21 04/19/2022   ALT 15 04/19/2022   ALKPHOS 60 04/19/2022   BILITOT 0.7 04/19/2022   GFRNONAA >60 04/19/2022   Ca125-18.1 on 04/19/2022   No results found for: "INR", "LABPROT"  Imaging:  CT ABDOMEN PELVIS W CONTRAST  Result Date: 04/20/2022 CLINICAL DATA:  Restaging peritoneal cancer. Extra ovarian primary. * Tracking Code: BO * EXAM: CT ABDOMEN AND PELVIS WITH CONTRAST TECHNIQUE: Multidetector CT imaging of the abdomen and pelvis was performed using the standard protocol following bolus administration of intravenous contrast. RADIATION DOSE REDUCTION: This exam  was performed according to the departmental dose-optimization program which includes automated exposure control, adjustment of the mA and/or kV according to patient size and/or use of iterative reconstruction technique. CONTRAST:  32m OMNIPAQUE IOHEXOL 300 MG/ML  SOLN COMPARISON:  CT 01/22/2022 and older FINDINGS: Lower chest: There is a right lower lobe lung nodule measuring 14 x 10 mm today which is unchanged from previous when adjusted for technique. There is some basilar atelectasis. No pleural effusion. Coronary artery calcifications are seen. Hepatobiliary: Multiple hepatic granulomas are identified. Gallbladder is nondilated. There is some slight soft tissue thickening along the falciform ligament on series 2, image 22, unchanged. There is some surface soft tissue thickening along the dome of the liver best seen on coronal imaging such as coronal series 5, image 44, unchanged. Pancreas: Parenchymal atrophy.  No mass. Spleen: Splenic granulomas.  The spleen is nonenlarged. Adrenals/Urinary Tract: Stable nodularity of the left adrenal gland. Previously measuring 13 mm in AP diameter and today 12 mm on series 2 image 20. Mild bilateral renal atrophy. Tiny renal cystic foci. Again no specific imaging follow-up. Upper pole left-sided nonobstructing renal stone. Ureters have normal course and caliber extending down to the bladder. Preserved contours of the urinary bladder. Stomach/Bowel: There is moderate debris in the distended stomach. Small and large bowel are nondilated. There is a right lower quadrant ostomy involving small bowel with the parastomal hernia of small bowel. Vascular/Lymphatic: Normal caliber aorta  and IVC with scattered vascular calcifications. There are several small less than 1 cm size retroperitoneal nodes identified, not pathologic by size criteria. Reproductive: Uterus and bilateral adnexa are unremarkable. Other: Once again there are areas of peritoneal thickening and nodularity  identified although appearance appears to be improving from previous examinations. Specific lesions will be followed. Measured lesion in the left mid abdominal mesentery which on the prior measured 3.1 x 2.0 cm, today appears 2.2 by 1.5 cm. More caudal focus which previously measured 2.2 x 1.8 cm, today on series 2, image 40 measures a proximally 1.9 by 1.5 cm. Again other areas of nodularity are similar to improved. Again this includes along the surface of the liver, lateral conal fascia scattered about the mesentery. Musculoskeletal: Curvature of the spine. Diffuse degenerative changes identified along the spine and pelvis. Multilevel stenosis and sclerosis. Stable compression deformity of L1. IMPRESSION: 1. Overall slight interval decrease in the extent of peritoneal carcinomatosis with significant residual. A few areas such as along the surface of the liver appears similar to previous. No new ascites. 2. Stable right lower lobe lung nodule. 3. Stable nodularity of the left adrenal gland. 4. Right lower quadrant ostomy with parastomal hernia of small bowel. No bowel obstruction. 5. Nonobstructing left-sided renal stone Aortic Atherosclerosis (ICD10-I70.0). Electronically Signed   By: Jill Side M.D.   On: 04/20/2022 16:51    Medications: I have reviewed the patient's current medications.   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  Bone lesions concerning for metastases, right lung nodule Laparoscopy, diverting loop ileostomy, gastrostomy tube placement, peritoneal biopsy 10/16/2021, no evidence of a primary tumor site at the appendix, ovaries, or uterus.  Diffuse peritoneal carcinomatosis Pathology of the  lesser curvature of stomach carcinomatosis biopsy-metastatic poorly differentiated carcinoma, CK7, PAX8, WT1, and p53 positive with focal labeling for p16.  CDX2, CK20, and GATA3 negative.  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 Foundation 1-MSS, tumor mutation burden 5, HRD positive-LOH score 34.1% 08/11/2021 CA125 818 CT chest 11/03/2021-10 mm right lower lobe nodule, scattered tiny pulmonary nodules, peritoneal carcinomatosis Cycle 1 Taxol/carboplatin 11/12/2021 Cycle 2 Taxol/carboplatin 12/03/2021 Cycle 3 Taxol/carboplatin 12/24/2021 Bone scan 01/05/2022-negative for metastatic disease Cycle 4 Taxol/carboplatin 01/13/2022 CTs 01/22/2022-reduction in peritoneal carcinomatosis, no evidence of progressive disease stable bilateral lower lobe pulmonary nodules Cycle 5 Taxol/carboplatin 02/03/2022 Cycle 6 Taxol/carboplatin 02/24/2022 CT abdomen/pelvis 04/19/2022-slight decrease in peritoneal carcinomatosis with residual lesions 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 2.   Abdominal distention/pain and diarrhea secondary to #1 3.   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  Lumpectomy and adjuvant CMF x8 followed by radiation 4. Hypertension 5. Hypothyroidism 6. Barrett's esophagus 7. Family history of multiple cancers including appendix, colon, and lung cancer 8.  Hospital admission 11/05/2021 with dehydration/prerenal azotemia secondary to nausea and high output ileostomy 9.  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  She has experienced significant clinical and radiologic improvement with Taxol/carboplatin chemotherapy.  The CA125 is normal.  I reviewed the CT findings and images with Ms. Mcfarren and her husband.  Surgery was not recommended by Dr. Clovis Riley.  We  discussed treatment options including  observation and beginning a maintenance regimen.  The tumor is HRD positive.  I recommend maintenance therapy with olaparib and bevacizumab.  I reviewed potential toxicities associated with these agents.  She was given reading materials.  She does not wish to begin maintenance therapy at present.  She will return for an office visit and further discussion in 3 weeks.  I recommended she use cornstarch or baby powder at the left groin rash.  She will resume her blood pressure medication.  [name], MD  04/21/2022  9:35 AM

## 2022-04-22 ENCOUNTER — Other Ambulatory Visit: Payer: Self-pay

## 2022-04-22 ENCOUNTER — Other Ambulatory Visit: Payer: Self-pay | Admitting: Oncology

## 2022-04-27 ENCOUNTER — Encounter: Payer: Self-pay | Admitting: Oncology

## 2022-04-28 ENCOUNTER — Other Ambulatory Visit: Payer: Self-pay | Admitting: Oncology

## 2022-05-03 ENCOUNTER — Other Ambulatory Visit: Payer: Self-pay | Admitting: Oncology

## 2022-05-03 ENCOUNTER — Encounter: Payer: Self-pay | Admitting: Oncology

## 2022-05-03 ENCOUNTER — Other Ambulatory Visit: Payer: Self-pay

## 2022-05-03 ENCOUNTER — Other Ambulatory Visit (HOSPITAL_COMMUNITY): Payer: Self-pay

## 2022-05-03 ENCOUNTER — Encounter: Payer: Self-pay | Admitting: *Deleted

## 2022-05-03 ENCOUNTER — Telehealth: Payer: Self-pay | Admitting: Pharmacist

## 2022-05-03 ENCOUNTER — Telehealth: Payer: Self-pay

## 2022-05-03 DIAGNOSIS — C481 Malignant neoplasm of specified parts of peritoneum: Secondary | ICD-10-CM

## 2022-05-03 MED ORDER — OLAPARIB 150 MG PO TABS: 300.0000 mg | ORAL_TABLET | Freq: Two times a day (BID) | ORAL | 0 refills | Status: DC

## 2022-05-03 MED ORDER — OLAPARIB 150 MG PO TABS
300.0000 mg | ORAL_TABLET | Freq: Two times a day (BID) | ORAL | 0 refills | Status: DC
  Filled 2022-05-03: qty 120, 30d supply, fill #0

## 2022-05-03 NOTE — Progress Notes (Signed)
DISCONTINUE OFF PATHWAY REGIMEN - Other   [id number]:Carboplatin AUC=5 IV D1 + Paclitaxel 175 mg/m2 IV D1 q21 Days:   A cycle is every 21 days:     Paclitaxel      Carboplatin   **Always confirm dose/schedule in your pharmacy ordering system**  REASON: Other Reason PRIOR TREATMENT: Carboplatin AUC=5 IV D1 + Paclitaxel 175 mg/m2 IV D1 q21 Days TREATMENT RESPONSE: Partial Response (PR)  START OFF PATHWAY REGIMEN - Other   [id number]:Bevacizumab 15 mg/kg IV D1 q21 Days:   A cycle is every 21 days:     Bevacizumab-xxxx   **Always confirm dose/schedule in your pharmacy ordering system**  Patient Characteristics: Intent of Therapy: Non-Curative / Palliative Intent, Discussed with Patient

## 2022-05-03 NOTE — Telephone Encounter (Signed)
Oral Oncology Patient Advocate Encounter  New authorization   Received notification that prior authorization for Lonie Peak is required.   PA submitted on 05/03/22  Key [id number]  Status is pending     [name], [place] [facility] [place]  [phone] (phone) [phone] (fax) 05/03/2022 8:39 AM

## 2022-05-03 NOTE — Telephone Encounter (Signed)
Called patient and let them know of approval for manufacturer assistance through [employer]. Patient knows expect a call from [employer] once prescription is processed. Patient knows to call [employer] at [phone] if they have not heard anything by Friday 05/07/22. Patient also knows to contact me at [phone] with any questions or concerns regarding co-pay or receiving [name] from [employer].    [name], [facility] Patient [place]  [phone] (phone) [phone] (fax) 05/03/2022 11:38 AM

## 2022-05-03 NOTE — Progress Notes (Signed)
Notified pharmacy that new script for olaparib sent to [facility]. Scheduling message sent to add Avastin to her appointments on 05/13/22.

## 2022-05-03 NOTE — Telephone Encounter (Signed)
Oral Chemotherapy Pharmacist Encounter  Patient has been approved for mfg assistance and her she will start pending medication delivery from that program.   Patient Education I spoke with patient for overview of new oral chemotherapy medication: [name] (olaparib) for the maintenance therapy of primary peritoneal carcinoma, HRD positive (Foundation One testing), following completion of carboplatin and paclitaxel in conjunction with bevacizumab, planned duration until disease progression or unacceptable drug toxicity.   Counseled patient on administration, dosing, side effects, monitoring, drug-food interactions, safe handling, storage, and disposal. Patient will take 2 tablets (300 mg total) by mouth 2 (two) times daily. Swallow whole. May take with food to decrease nausea and vomiting.   Side effects include but not limited to: nausea, fatigue, diarrhea, decrease in hgb.   Nausea: patient has ondansetron on hand at home to use as needed. She knows that taking olaparib with food helps to decrease the nausea. She also knows she can take the ondansetron 30-60 mins prior to her olaparib if she is noticing a pattern to her nausea Diarrhea: patient reported having loperamide on hand. She knows to call the office if she is using the loperamide but continuing to have 4 or more loose stools  Reviewed with patient importance of keeping a medication schedule and plan for any missed doses.  After discussion with patient no patient barriers to medication adherence identified.   Ms. Courtney Heys voiced understanding and appreciation. All questions answered. Medication handout provided.  Provided patient with Oral [facility] phone number. Patient knows to call the office with questions or concerns. Oral Chemotherapy [facility] will continue to follow.  [name], PharmD, BCPS, BCOP, CPP Hematology/Oncology Clinical Pharmacist Practitioner [place]/DB/AP [facility] [phone]  05/03/2022 1:57 PM

## 2022-05-03 NOTE — Telephone Encounter (Signed)
Oral Oncology Patient Advocate Encounter   Began application for assistance for [name] through [employer] Prescription Savings Program.   Application will be submitted upon completion of necessary supporting documentation.   [employer]'s phone number [phone].   I will continue to check the status until final determination.    [name], [facility] Patient [place]  [phone] (phone) [phone] (fax) 05/03/2022 9:56 AM

## 2022-05-03 NOTE — Telephone Encounter (Signed)
Oral Oncology Pharmacist Encounter  Received new prescription for [name] (olaparib) for the maintenance therapy of primary peritoneal carcinoma, HRD positive (Foundation One testing), following completion of carboplatin and paclitaxel in conjunction with bevacizumab, planned duration until disease progression or unacceptable drug toxicity.  CMP from 04/19/22 assessed, no relevant lab abnormalities. Note, patient borderline renal dose adjustment depending on SCr rounding in the CrCl calculation, monitor once started for tolerability and the need to dose reduce. Prescription dose and frequency assessed.   Current medication list in Epic reviewed, no DDIs with olaparib identified.  Evaluated chart and no patient barriers to medication adherence identified.   Prescription has been e-scribed to the [facility] for benefits analysis and approval.  Oral Oncology Clinic will continue to follow for insurance authorization, copayment issues, initial counseling and start date.   [name], PharmD, BCPS, BCOP, CPP Hematology/Oncology Clinical Pharmacist Practitioner [place]/DB/AP [facility] [phone]  05/03/2022 10:01 AM

## 2022-05-03 NOTE — Telephone Encounter (Signed)
Oral Oncology Patient Advocate Encounter   Submitted application for assistance for [name] to [employer] Prescription Savings Program.   Application submitted via [employer] Web Portal   [employer]'s phone number [phone].   I will continue to check the status until final determination.    [name], [facility] Patient [place]  [phone] (phone) [phone] (fax) 05/03/2022 9:58 AM

## 2022-05-03 NOTE — Telephone Encounter (Addendum)
Oral Oncology Patient Advocate Encounter   Received notification that the application for assistance for [name] through [employer] Prescription Savings Program has been approved.   [employer]'s phone number [phone].   Effective dates: 05/03/22 through 02/22/23  A script for Lonie Peak needs to be e-scribed to [facility] or faxed to [employer] at [phone].  Script has been e-scribed to [employer] for processing.   [name], [facility] Patient [place]  [phone] (phone) [phone] (fax) 05/03/2022 9:59 AM

## 2022-05-03 NOTE — Telephone Encounter (Signed)
Oral Oncology Patient Advocate Encounter  Prior Authorization for Nancy Harrison has been approved.    PA# [id number]  Effective dates: 02/22/22 through 02/22/23  Patients co-pay is $3,310.78.    [name], [facility] Patient [place]  [phone] (phone) [phone] (fax) 05/03/2022 8:45 AM

## 2022-05-03 NOTE — Telephone Encounter (Signed)
Called and left VM on patient's number to go over cost. I was able to talk to patient's spouse, [name], and let him know the co-pay. Informed him we could start on Manufacturer Assistance through [employer] to get medication at no cost. He was agreeable for me to initiate and is letting the patient know.    [name], [facility] Patient [place]  [phone] (phone) [phone] (fax) 05/03/2022 9:20 AM

## 2022-05-04 NOTE — Progress Notes (Signed)
The following biosimilar Zirabev (bevacizumab-bvzr) has been selected for use in this patient.

## 2022-05-09 ENCOUNTER — Other Ambulatory Visit: Payer: Self-pay | Admitting: Oncology

## 2022-05-09 DIAGNOSIS — C481 Malignant neoplasm of specified parts of peritoneum: Secondary | ICD-10-CM

## 2022-05-10 ENCOUNTER — Telehealth: Payer: Self-pay | Admitting: Oncology

## 2022-05-10 DIAGNOSIS — Z932 Ileostomy status: Secondary | ICD-10-CM | POA: Diagnosis not present

## 2022-05-10 NOTE — Telephone Encounter (Signed)
Patient called in to reschedule her lab/flush/ OV/TX from 3/21 to 3/28. Patient advised that something came up for her needing to reschedule

## 2022-05-13 ENCOUNTER — Other Ambulatory Visit: Payer: Medicare HMO

## 2022-05-13 ENCOUNTER — Ambulatory Visit: Payer: Medicare HMO | Admitting: Nurse Practitioner

## 2022-05-13 ENCOUNTER — Ambulatory Visit: Payer: Medicare HMO

## 2022-05-13 DIAGNOSIS — Z932 Ileostomy status: Secondary | ICD-10-CM | POA: Diagnosis not present

## 2022-05-20 ENCOUNTER — Inpatient Hospital Stay: Payer: Medicare HMO | Attending: Oncology

## 2022-05-20 ENCOUNTER — Encounter: Payer: Self-pay | Admitting: *Deleted

## 2022-05-20 ENCOUNTER — Inpatient Hospital Stay: Payer: Medicare HMO | Admitting: Oncology

## 2022-05-20 ENCOUNTER — Inpatient Hospital Stay: Payer: Medicare HMO

## 2022-05-20 ENCOUNTER — Other Ambulatory Visit: Payer: Self-pay

## 2022-05-20 VITALS — BP 108/64 | HR 93 | Temp 98.2°F | Resp 18

## 2022-05-20 VITALS — BP 125/68 | HR 97 | Temp 98.2°F | Resp 18 | Ht 59.0 in | Wt 118.0 lb

## 2022-05-20 DIAGNOSIS — Z5112 Encounter for antineoplastic immunotherapy: Secondary | ICD-10-CM | POA: Diagnosis not present

## 2022-05-20 DIAGNOSIS — C481 Malignant neoplasm of specified parts of peritoneum: Secondary | ICD-10-CM

## 2022-05-20 LAB — CMP (CANCER CENTER ONLY)
ALT: 14 U/L (ref 0–44)
AST: 19 U/L (ref 15–41)
Albumin: 3.9 g/dL (ref 3.5–5.0)
Alkaline Phosphatase: 46 U/L (ref 38–126)
Anion gap: 9 (ref 5–15)
BUN: 31 mg/dL — ABNORMAL HIGH (ref 8–23)
CO2: 22 mmol/L (ref 22–32)
Calcium: 10.1 mg/dL (ref 8.9–10.3)
Chloride: 102 mmol/L (ref 98–111)
Creatinine: 1.09 mg/dL — ABNORMAL HIGH (ref 0.44–1.00)
GFR, Estimated: 52 mL/min — ABNORMAL LOW
Glucose, Bld: 122 mg/dL — ABNORMAL HIGH (ref 70–99)
Potassium: 4.3 mmol/L (ref 3.5–5.1)
Sodium: 133 mmol/L — ABNORMAL LOW (ref 135–145)
Total Bilirubin: 0.6 mg/dL (ref 0.3–1.2)
Total Protein: 7.2 g/dL (ref 6.5–8.1)

## 2022-05-20 LAB — CBC WITH DIFFERENTIAL (CANCER CENTER ONLY)
Abs Immature Granulocytes: 0.02 10*3/uL (ref 0.00–0.07)
Basophils Absolute: 0 10*3/uL (ref 0.0–0.1)
Basophils Relative: 1 %
Eosinophils Absolute: 0.1 10*3/uL (ref 0.0–0.5)
Eosinophils Relative: 2 %
HCT: 35.9 % — ABNORMAL LOW (ref 36.0–46.0)
Hemoglobin: 12.1 g/dL (ref 12.0–15.0)
Immature Granulocytes: 0 %
Lymphocytes Relative: 20 %
Lymphs Abs: 0.9 10*3/uL (ref 0.7–4.0)
MCH: 35.4 pg — ABNORMAL HIGH (ref 26.0–34.0)
MCHC: 33.7 g/dL (ref 30.0–36.0)
MCV: 105 fL — ABNORMAL HIGH (ref 80.0–100.0)
Monocytes Absolute: 0.4 10*3/uL (ref 0.1–1.0)
Monocytes Relative: 9 %
Neutro Abs: 3.2 10*3/uL (ref 1.7–7.7)
Neutrophils Relative %: 68 %
Platelet Count: 240 10*3/uL (ref 150–400)
RBC: 3.42 MIL/uL — ABNORMAL LOW (ref 3.87–5.11)
RDW: 11.6 % (ref 11.5–15.5)
WBC Count: 4.7 10*3/uL (ref 4.0–10.5)
nRBC: 0 % (ref 0.0–0.2)

## 2022-05-20 MED ORDER — SODIUM CHLORIDE 0.9 % IV SOLN
15.0000 mg/kg | Freq: Once | INTRAVENOUS | Status: AC
  Administered 2022-05-20: 800 mg via INTRAVENOUS
  Filled 2022-05-20: qty 32

## 2022-05-20 MED ORDER — SODIUM CHLORIDE 0.9 % IV SOLN: Freq: Once | INTRAVENOUS | Status: AC

## 2022-05-20 MED ORDER — HEPARIN SOD (PORK) LOCK FLUSH 100 UNIT/ML IV SOLN
500.0000 [IU] | Freq: Once | INTRAVENOUS | Status: AC | PRN
  Administered 2022-05-20: 500 [IU]

## 2022-05-20 MED ORDER — SODIUM CHLORIDE 0.9% FLUSH
10.0000 mL | INTRAVENOUS | Status: DC | PRN
  Administered 2022-05-20: 10 mL

## 2022-05-20 NOTE — Patient Instructions (Signed)
[facility]  Discharge Instructions: Thank you for choosing [facility] to provide your oncology and hematology care.   If you have a lab appointment with the Cancer Center, please go directly to the Cancer Center and check in at the registration area.   Wear comfortable clothing and clothing appropriate for easy access to any Portacath or PICC line.   We strive to give you quality time with your provider. You may need to reschedule your appointment if you arrive late (15 or more minutes).  Arriving late affects you and other patients whose appointments are after yours.  Also, if you miss three or more appointments without notifying the office, you may be dismissed from the clinic at the provider's discretion.      For prescription refill requests, have your pharmacy contact our office and allow 72 hours for refills to be completed.    Today you received the following chemotherapy and/or immunotherapy agents: bevacizumab      To help prevent nausea and vomiting after your treatment, we encourage you to take your nausea medication as directed.  BELOW ARE SYMPTOMS THAT SHOULD BE REPORTED IMMEDIATELY: *FEVER GREATER THAN 100.4 F (38 C) OR HIGHER *CHILLS OR SWEATING *NAUSEA AND VOMITING THAT IS NOT CONTROLLED WITH YOUR NAUSEA MEDICATION *UNUSUAL SHORTNESS OF BREATH *UNUSUAL BRUISING OR BLEEDING *URINARY PROBLEMS (pain or burning when urinating, or frequent urination) *BOWEL PROBLEMS (unusual diarrhea, constipation, pain near the anus) TENDERNESS IN MOUTH AND THROAT WITH OR WITHOUT PRESENCE OF ULCERS (sore throat, sores in mouth, or a toothache) UNUSUAL RASH, SWELLING OR PAIN  UNUSUAL VAGINAL DISCHARGE OR ITCHING   Items with * indicate a potential emergency and should be followed up as soon as possible or go to the Emergency Department if any problems should occur.  Please show the CHEMOTHERAPY ALERT CARD or IMMUNOTHERAPY ALERT CARD at  check-in to the Emergency Department and triage nurse.  Should you have questions after your visit or need to cancel or reschedule your appointment, please contact [facility]  Dept: [phone]  and follow the prompts.  Office hours are 8:00 a.m. to 4:30 p.m. Monday - Friday. Please note that voicemails left after 4:00 p.m. may not be returned until the following business day.  We are closed weekends and major holidays. You have access to a nurse at all times for urgent questions. Please call the main number to the clinic Dept: [phone] and follow the prompts.   For any non-urgent questions, you may also contact your provider using MyChart. We now offer e-Visits for anyone 18 and older to request care online for non-urgent symptoms. For details visit [email or web address].   Also download the MyChart app! Go to the app store, search "MyChart", open the app, select [facility], and log in with your MyChart username and password.  Bevacizumab Injection What is this medication? BEVACIZUMAB (be va SIZ yoo mab) treats some types of cancer. It works by blocking a protein that causes cancer cells to grow and multiply. This helps to slow or stop the spread of cancer cells. It is a monoclonal antibody. This medicine may be used for other purposes; ask your health care provider or pharmacist if you have questions. COMMON BRAND NAME(S): Alymsys, Avastin, MVASI, Zirabev What should I tell my care team before I take this medication? They need to know if you have any of these conditions: Blood clots Coughing up blood Having or recent surgery Heart failure High   blood pressure History of a connection between 2 or more body parts that do not usually connect (fistula) History of a tear in your stomach or intestines Protein in your urine An unusual or allergic reaction to bevacizumab, other medications, foods, dyes, or preservatives Pregnant or trying to get  pregnant Breast-feeding How should I use this medication? This medication is injected into a vein. It is given by your care team in a hospital or clinic setting. Talk to your care team the use of this medication in children. Special care may be needed. Overdosage: If you think you have taken too much of this medicine contact a poison control center or emergency room at once. NOTE: This medicine is only for you. Do not share this medicine with others. What if I miss a dose? Keep appointments for follow-up doses. It is important not to miss your dose. Call your care team if you are unable to keep an appointment. What may interact with this medication? Interactions are not expected. This list may not describe all possible interactions. Give your health care provider a list of all the medicines, herbs, non-prescription drugs, or dietary supplements you use. Also tell them if you smoke, drink alcohol, or use illegal drugs. Some items may interact with your medicine. What should I watch for while using this medication? Your condition will be monitored carefully while you are receiving this medication. You may need blood work while taking this medication. This medication may make you feel generally unwell. This is not uncommon as chemotherapy can affect healthy cells as well as cancer cells. Report any side effects. Continue your course of treatment even though you feel ill unless your care team tells you to stop. This medication may increase your risk to bruise or bleed. Call your care team if you notice any unusual bleeding. Before having surgery, talk to your care team to make sure it is ok. This medication can increase the risk of poor healing of your surgical site or wound. You will need to stop this medication for 28 days before surgery. After surgery, wait at least 28 days before restarting this medication. Make sure the surgical site or wound is healed enough before restarting this medication. Talk  to your care team if questions. Talk to your care team if you may be pregnant. Serious birth defects can occur if you take this medication during pregnancy and for 6 months after the last dose. Contraception is recommended while taking this medication and for 6 months after the last dose. Your care team can help you find the option that works for you. Do not breastfeed while taking this medication and for 6 months after the last dose. This medication can cause infertility. Talk to your care team if you are concerned about your fertility. What side effects may I notice from receiving this medication? Side effects that you should report to your care team as soon as possible: Allergic reactions--skin rash, itching, hives, swelling of the face, lips, tongue, or throat Bleeding--bloody or black, tar-like stools, vomiting blood or brown material that looks like coffee grounds, red or dark brown urine, small red or purple spots on skin, unusual bruising or bleeding Blood clot--pain, swelling, or warmth in the leg, shortness of breath, chest pain Heart attack--pain or tightness in the chest, shoulders, arms, or jaw, nausea, shortness of breath, cold or clammy skin, feeling faint or lightheaded Heart failure--shortness of breath, swelling of the ankles, feet, or hands, sudden weight gain, unusual weakness or fatigue Increase   in blood pressure Infection--fever, chills, cough, sore throat, wounds that don't heal, pain or trouble when passing urine, general feeling of discomfort or being unwell Infusion reactions--chest pain, shortness of breath or trouble breathing, feeling faint or lightheaded Kidney injury--decrease in the amount of urine, swelling of the ankles, hands, or feet Stomach pain that is severe, does not go away, or gets worse Stroke--sudden numbness or weakness of the face, arm, or leg, trouble speaking, confusion, trouble walking, loss of balance or coordination, dizziness, severe headache,  change in vision Sudden and severe headache, confusion, change in vision, seizures, which may be signs of posterior reversible encephalopathy syndrome (PRES) Side effects that usually do not require medical attention (report to your care team if they continue or are bothersome): Back pain Change in taste Diarrhea Dry skin Increased tears Nosebleed This list may not describe all possible side effects. Call your doctor for medical advice about side effects. You may report side effects to FDA at 1-800-FDA-1088. Where should I keep my medication? This medication is given in a hospital or clinic. It will not be stored at home. NOTE: This sheet is a summary. It may not cover all possible information. If you have questions about this medicine, talk to your doctor, pharmacist, or health care provider.  2023 Elsevier/Gold Standard (2021-06-12 00:00:00)  

## 2022-05-20 NOTE — Progress Notes (Signed)
[place] OFFICE PROGRESS NOTE   Diagnosis: Peritoneal carcinoma  INTERVAL HISTORY:   Nancy Harrison returns as scheduled.  She feels well.  The ostomy is functioning well.  No new complaint.  She began olaparib on 05/14/2022.  No rash, nausea, or diarrhea.  She is scheduled to begin bevacizumab today.  Objective:  Vital signs in last 24 hours:  Blood pressure 125/68, pulse 97, temperature 98.2 F (36.8 C), temperature source Oral, resp. rate 18, height 4\' 11"  (1.499 m), weight 118 lb (53.5 kg), SpO2 98 %.    HEENT: No thrush or ulcers Resp: Lungs clear bilaterally Cardio: Regular rate and rhythm GI: Right abdomen ileostomy, the abdomen is soft, no apparent ascites Vascular: No leg edema   Portacath/PICC-without erythema  Lab Results:  Lab Results  Component Value Date   WBC 4.7 05/20/2022   HGB 12.1 05/20/2022   HCT 35.9 (L) 05/20/2022   MCV 105.0 (H) 05/20/2022   PLT 240 05/20/2022   NEUTROABS 3.2 05/20/2022    CMP  Lab Results  Component Value Date   NA 133 (L) 05/20/2022   K 4.3 05/20/2022   CL 102 05/20/2022   CO2 22 05/20/2022   GLUCOSE 122 (H) 05/20/2022   BUN 31 (H) 05/20/2022   CREATININE 1.09 (H) 05/20/2022   CALCIUM 10.1 05/20/2022   PROT 7.2 05/20/2022   ALBUMIN 3.9 05/20/2022   AST 19 05/20/2022   ALT 14 05/20/2022   ALKPHOS 46 05/20/2022   BILITOT 0.6 05/20/2022   GFRNONAA 52 (L) 05/20/2022     Medications: I have reviewed the patient's current medications.   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  Bone lesions concerning for metastases, right lung nodule Laparoscopy, diverting loop ileostomy, gastrostomy tube placement,  peritoneal biopsy 10/16/2021, no evidence of a primary tumor site at the appendix, ovaries, or uterus.  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  CDX2, CK20, and GATA3 negative.  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 Foundation 1-MSS, tumor mutation burden 5, HRD positive-LOH score 34.1% 08/11/2021 CA125 818 CT chest 11/03/2021-10 mm right lower lobe nodule, scattered tiny pulmonary nodules, peritoneal carcinomatosis Cycle 1 Taxol/carboplatin 11/12/2021 Cycle 2 Taxol/carboplatin 12/03/2021 Cycle 3 Taxol/carboplatin 12/24/2021 Bone scan 01/05/2022-negative for metastatic disease Cycle 4 Taxol/carboplatin 01/13/2022 CTs 01/22/2022-reduction in peritoneal carcinomatosis, no evidence of progressive disease stable bilateral lower lobe pulmonary nodules Cycle 5 Taxol/carboplatin 02/03/2022 Cycle 6 Taxol/carboplatin 02/24/2022 CT abdomen/pelvis 04/19/2022-slight decrease in peritoneal carcinomatosis with residual lesions 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   Abdominal distention/pain and diarrhea secondary to #1 3.   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  Lumpectomy and adjuvant CMF x8 followed by radiation 4. Hypertension 5. Hypothyroidism 6. Barrett's esophagus 7. Family history of multiple cancers including appendix, colon, and lung cancer 8.  Hospital admission 11/05/2021 with dehydration/prerenal azotemia secondary to nausea and high output ileostomy 9.  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  To begin maintenance therapy with olaparib on 05/14/2022.  She will begin bevacizumab today.  We again reviewed potential toxicities associated with bevacizumab.  She agrees to proceed.  She will return for an office visit and bevacizumab in 3 weeks.  [name], MD  05/20/2022  12:02 PM

## 2022-05-20 NOTE — Patient Instructions (Signed)

## 2022-05-20 NOTE — Progress Notes (Signed)
Nancy Harrison not able to void enough for urine protein reported she voided before leaving home. Per Dr. Benay Spice: OK to proceed w/Avastin today without urine protein, but will be needed w/next treatment. Provided patient cup to take home and void prior to leaving home day of next Avastin and bring to office w/her.

## 2022-05-20 NOTE — Progress Notes (Signed)
Patient seen by Dr. Benay Spice today  Vitals are within treatment parameters.  Labs reviewed by Dr. Benay Spice  CBC/diff and CMP are in parameters. Urine protein yet to be collected.  Per physician team, patient is ready for treatment and there are NO modifications to the treatment plan. Need to wait on urine protein to start tx

## 2022-05-21 ENCOUNTER — Telehealth: Payer: Self-pay

## 2022-05-21 NOTE — Telephone Encounter (Signed)
Called and spoke to patient for first time chemo follow-up.  Patient stated she has tolerated treatment well and has not noticed any side effects.  Patient instructed to contact office with any future questions or concerns.  Patient verbalized understanding.  All questions were answered during phone call.

## 2022-05-22 LAB — CA 125: Cancer Antigen (CA) 125: 16.2 U/mL (ref 0.0–38.1)

## 2022-05-25 ENCOUNTER — Other Ambulatory Visit: Payer: Self-pay

## 2022-05-28 ENCOUNTER — Other Ambulatory Visit: Payer: Self-pay | Admitting: *Deleted

## 2022-05-28 ENCOUNTER — Other Ambulatory Visit: Payer: Self-pay | Admitting: Oncology

## 2022-05-28 DIAGNOSIS — C786 Secondary malignant neoplasm of retroperitoneum and peritoneum: Secondary | ICD-10-CM

## 2022-05-29 ENCOUNTER — Encounter (HOSPITAL_BASED_OUTPATIENT_CLINIC_OR_DEPARTMENT_OTHER): Payer: Self-pay | Admitting: *Deleted

## 2022-05-29 ENCOUNTER — Emergency Department (HOSPITAL_BASED_OUTPATIENT_CLINIC_OR_DEPARTMENT_OTHER): Payer: Medicare HMO

## 2022-05-29 ENCOUNTER — Inpatient Hospital Stay (HOSPITAL_BASED_OUTPATIENT_CLINIC_OR_DEPARTMENT_OTHER)
Admission: EM | Admit: 2022-05-29 | Discharge: 2022-06-02 | DRG: 682 | Disposition: A | Discharge status: Home / Self Care | Payer: Medicare HMO | Attending: Internal Medicine | Admitting: Internal Medicine

## 2022-05-29 ENCOUNTER — Other Ambulatory Visit: Payer: Self-pay

## 2022-05-29 DIAGNOSIS — E876 Hypokalemia: Secondary | ICD-10-CM | POA: Diagnosis not present

## 2022-05-29 DIAGNOSIS — Z1152 Encounter for screening for COVID-19: Secondary | ICD-10-CM

## 2022-05-29 DIAGNOSIS — K2270 Barrett's esophagus without dysplasia: Secondary | ICD-10-CM | POA: Diagnosis present

## 2022-05-29 DIAGNOSIS — T464X5A Adverse effect of angiotensin-converting-enzyme inhibitors, initial encounter: Secondary | ICD-10-CM | POA: Diagnosis present

## 2022-05-29 DIAGNOSIS — R112 Nausea with vomiting, unspecified: Secondary | ICD-10-CM | POA: Diagnosis present

## 2022-05-29 DIAGNOSIS — C786 Secondary malignant neoplasm of retroperitoneum and peritoneum: Secondary | ICD-10-CM | POA: Diagnosis present

## 2022-05-29 DIAGNOSIS — N179 Acute kidney failure, unspecified: Principal | ICD-10-CM | POA: Diagnosis present

## 2022-05-29 DIAGNOSIS — Z801 Family history of malignant neoplasm of trachea, bronchus and lung: Secondary | ICD-10-CM

## 2022-05-29 DIAGNOSIS — D630 Anemia in neoplastic disease: Secondary | ICD-10-CM | POA: Diagnosis present

## 2022-05-29 DIAGNOSIS — E039 Hypothyroidism, unspecified: Secondary | ICD-10-CM | POA: Diagnosis present

## 2022-05-29 DIAGNOSIS — Z931 Gastrostomy status: Secondary | ICD-10-CM

## 2022-05-29 DIAGNOSIS — Z87891 Personal history of nicotine dependence: Secondary | ICD-10-CM

## 2022-05-29 DIAGNOSIS — R000 Tachycardia, unspecified: Secondary | ICD-10-CM | POA: Diagnosis present

## 2022-05-29 DIAGNOSIS — Z853 Personal history of malignant neoplasm of breast: Secondary | ICD-10-CM

## 2022-05-29 DIAGNOSIS — E875 Hyperkalemia: Secondary | ICD-10-CM

## 2022-05-29 DIAGNOSIS — Z171 Estrogen receptor negative status [ER-]: Secondary | ICD-10-CM

## 2022-05-29 DIAGNOSIS — E8721 Acute metabolic acidosis: Secondary | ICD-10-CM | POA: Diagnosis present

## 2022-05-29 DIAGNOSIS — N393 Stress incontinence (female) (male): Secondary | ICD-10-CM | POA: Diagnosis present

## 2022-05-29 DIAGNOSIS — Z432 Encounter for attention to ileostomy: Secondary | ICD-10-CM

## 2022-05-29 DIAGNOSIS — T451X5A Adverse effect of antineoplastic and immunosuppressive drugs, initial encounter: Secondary | ICD-10-CM | POA: Diagnosis present

## 2022-05-29 DIAGNOSIS — Z888 Allergy status to other drugs, medicaments and biological substances status: Secondary | ICD-10-CM

## 2022-05-29 DIAGNOSIS — C482 Malignant neoplasm of peritoneum, unspecified: Secondary | ICD-10-CM

## 2022-05-29 DIAGNOSIS — E860 Dehydration: Secondary | ICD-10-CM | POA: Diagnosis present

## 2022-05-29 DIAGNOSIS — I10 Essential (primary) hypertension: Secondary | ICD-10-CM | POA: Diagnosis present

## 2022-05-29 DIAGNOSIS — K529 Noninfective gastroenteritis and colitis, unspecified: Secondary | ICD-10-CM | POA: Diagnosis present

## 2022-05-29 DIAGNOSIS — Z932 Ileostomy status: Secondary | ICD-10-CM

## 2022-05-29 DIAGNOSIS — J9601 Acute respiratory failure with hypoxia: Secondary | ICD-10-CM | POA: Diagnosis present

## 2022-05-29 DIAGNOSIS — Z9221 Personal history of antineoplastic chemotherapy: Secondary | ICD-10-CM

## 2022-05-29 DIAGNOSIS — K56699 Other intestinal obstruction unspecified as to partial versus complete obstruction: Secondary | ICD-10-CM | POA: Diagnosis present

## 2022-05-29 DIAGNOSIS — Z85831 Personal history of malignant neoplasm of soft tissue: Secondary | ICD-10-CM

## 2022-05-29 DIAGNOSIS — D638 Anemia in other chronic diseases classified elsewhere: Secondary | ICD-10-CM | POA: Diagnosis present

## 2022-05-29 DIAGNOSIS — Z96653 Presence of artificial knee joint, bilateral: Secondary | ICD-10-CM | POA: Diagnosis present

## 2022-05-29 DIAGNOSIS — E871 Hypo-osmolality and hyponatremia: Secondary | ICD-10-CM | POA: Diagnosis present

## 2022-05-29 DIAGNOSIS — Z79899 Other long term (current) drug therapy: Secondary | ICD-10-CM

## 2022-05-29 DIAGNOSIS — Z800 Family history of malignant neoplasm of digestive organs: Secondary | ICD-10-CM

## 2022-05-29 MED ORDER — LACTATED RINGERS IV SOLN: INTRAVENOUS | Status: DC

## 2022-05-29 MED ORDER — LACTATED RINGERS IV BOLUS
500.0000 mL | Freq: Once | INTRAVENOUS | Status: AC
  Administered 2022-05-29: 500 mL via INTRAVENOUS

## 2022-05-29 NOTE — ED Provider Notes (Signed)
[place] EMERGENCY DEPARTMENT AT [facility] Provider Note   CSN: [id number] Arrival date & time: 05/29/22  2202     History  Chief Complaint  Patient presents with   Nausea    Shaunell Sangiovanni is a 82 y.o. female.  Patient is a 82 year old female with past medical history of peritoneal carcinomatosis with colostomy, hypertension, breast cancer.  Patient presenting today for evaluation of nausea and low blood pressure.  Patient's symptoms started yesterday.  This evening, her husband checked her blood pressure and states that it was 80/40 and brings her for evaluation.  Patient reports no abdominal pain.  She does describe slightly loose stool, but does have a colostomy bag.  She denies fevers or chills.  She denies abdominal pain.  She denies chest pain, cough, or feeling short of breath.  The history is provided by the patient.       Home Medications Prior to Admission medications   Medication Sig Start Date End Date Taking? Authorizing Provider  acetaminophen (TYLENOL) 325 MG tablet Take 2 tablets (650 mg total) by mouth every 6 (six) hours as needed for mild pain or fever. 10/22/21   Meuth, Brooke A, PA-C  diphenoxylate-atropine (LOMOTIL) 2.5-0.025 MG tablet TAKE ONE TABLET BY MOUTH EVERY 12 HOURS AS NEEDED FOR LOOSE STOOLS 02/08/22   Ladene Artist, MD  ibuprofen (ADVIL) 200 MG tablet Take 400 mg by mouth every 6 (six) hours as needed for moderate pain.    [provider]  lisinopril (ZESTRIL) 5 MG tablet Take 5 mg by mouth daily.    [provider]  loperamide (IMODIUM A-D) 2 MG tablet Take 2 tablets (4 mg total) by mouth 4 (four) times daily as needed for diarrhea or loose stools. Patient taking differently: Take 4 mg by mouth in the morning, at noon, and at bedtime. 11/11/21   Ladene Artist, MD  loratadine (CLARITIN) 10 MG tablet Take 10 mg by mouth 2 (two) times daily.    [provider]  magnesium oxide (MAG-OX) 400 (240 Mg) MG  tablet TAKE 1 TABLET BY MOUTH 2 TIMES A DAY 05/28/22   Ladene Artist, MD  melatonin 5 MG TABS Take 5 mg by mouth at bedtime.    [provider]  methocarbamol (ROBAXIN) 500 MG tablet Take 1 tablet (500 mg total) by mouth every 8 (eight) hours as needed for muscle spasms. Patient not taking: Reported on 05/20/2022 03/16/22   Ladene Artist, MD  Multiple Vitamin (MULTIVITAMIN PO) Take 1 tablet by mouth daily with breakfast.    [provider]  olaparib ([name]) 150 MG tablet Take 2 tablets (300 mg total) by mouth 2 (two) times daily. Swallow whole. May take with food to decrease nausea and vomiting. 05/03/22   Ladene Artist, MD  ondansetron (ZOFRAN) 8 MG tablet Take 8 mg by mouth every 8 (eight) hours as needed for nausea or vomiting. Patient not taking: Reported on 05/20/2022    Ladene Artist, MD  potassium chloride (MICRO-K) 10 MEQ CR capsule TAKE 1 CAPSULE BY MOUTH TWICE A DAY 04/22/22   Ladene Artist, MD  triamcinolone cream (KENALOG) 0.1 % Apply 1 Application topically daily as needed (rash). Patient not taking: Reported on 05/20/2022    [provider]      Allergies    Sporanox [itraconazole]    Review of Systems   Review of Systems  All other systems reviewed and are negative.   Physical Exam Updated Vital Signs  BP 103/62   Pulse (!) 113   Temp 97.9 F (36.6 C) (Oral)   Resp (!) 31   Ht 4\' 11"  (1.499 m)   Wt 52.2 kg   SpO2 92%   BMI 23.23 kg/m  Physical Exam Vitals and nursing note reviewed.  Constitutional:      General: She is not in acute distress.    Appearance: She is well-developed. She is not diaphoretic.  HENT:     Head: Normocephalic and atraumatic.  Cardiovascular:     Rate and Rhythm: Normal rate and regular rhythm.     Heart sounds: No murmur heard.    No friction rub. No gallop.  Pulmonary:     Effort: Pulmonary effort is normal. No respiratory distress.     Breath sounds: Normal breath sounds. No wheezing.   Abdominal:     General: Bowel sounds are normal. There is no distension.     Palpations: Abdomen is soft.     Tenderness: There is no abdominal tenderness.  Musculoskeletal:        General: Normal range of motion.     Cervical back: Normal range of motion and neck supple.  Skin:    General: Skin is warm and dry.  Neurological:     General: No focal deficit present.     Mental Status: She is alert and oriented to person, place, and time.     ED Results / Procedures / Treatments   Labs (all labs ordered are listed, but only abnormal results are displayed) Labs Reviewed  CULTURE, BLOOD (ROUTINE X 2)  CULTURE, BLOOD (ROUTINE X 2)  RESP PANEL BY RT-PCR (RSV, FLU A&B, COVID)  RVPGX2  COMPREHENSIVE METABOLIC PANEL  LACTIC ACID, PLASMA  LACTIC ACID, PLASMA  CBC WITH DIFFERENTIAL/PLATELET  URINALYSIS, ROUTINE W REFLEX MICROSCOPIC  MAGNESIUM  PHOSPHORUS  PROTIME-INR    EKG None  Radiology No results found.  Procedures Procedures  {Document cardiac monitor, telemetry assessment procedure when appropriate:1}  Medications Ordered in ED Medications  lactated ringers bolus 500 mL (has no administration in time range)  lactated ringers infusion (has no administration in time range)    ED Course/ Medical Decision Making/ A&P   {   Click here for ABCD2, HEART and other calculatorsREFRESH Note before signing :1}                          Medical Decision Making Amount and/or Complexity of Data Reviewed Labs: ordered. Radiology: ordered.  Risk Prescription drug management.   ***  {Document critical care time when appropriate:1} {Document review of labs and clinical decision tools ie heart score, Chads2Vasc2 etc:1}  {Document your independent review of radiology images, and any outside records:1} {Document your discussion with family members, caretakers, and with consultants:1} {Document social determinants of health affecting pt's care:1} {Document your decision  making why or why not admission, treatments were needed:1} Final Clinical Impression(s) / ED Diagnoses Final diagnoses:  None    Rx / DC Orders ED Discharge Orders     None

## 2022-05-29 NOTE — ED Provider Triage Note (Signed)
Emergency Medicine Provider Triage Evaluation Note  Nancy Harrison , a 82 y.o. female  was evaluated in triage.  Pt complains of weakness loss of appetite and now vomiting.  Patient has history of primary peritoneal carcinoma and recently transitioned to oral chemotherapy.  Patient denies shortness of breath or chest pain.  She denies swelling or pain in the legs.  Review of Systems  Positive: Loss of appetite nausea vomiting Negative: Chest pain cough or shortness of breath  Physical Exam  BP 103/62   Pulse (!) 109   Temp 97.9 F (36.6 C) (Oral)   Resp (!) 24   Ht 4\' 11"  (1.499 m)   Wt 52.2 kg   SpO2 92%   BMI 23.23 kg/m  Gen:   Awake, no distress patient is alert.  She does not exhibit respiratory distress at rest.  Pale in appearance. Resp:  Normal effort lungs are bilaterally clear to auscultation MSK:   Moves extremities without difficulty no peripheral edema calves nontender. Other:  Abdomen soft without guarding.  Medical Decision Making  Medically screening exam initiated at 10:45 PM.  Appropriate orders placed.  Edwardine Hewell was informed that the remainder of the evaluation will be completed by another provider, this initial triage assessment does not replace that evaluation, and the importance of remaining in the ED until their evaluation is complete.  Patient presents with weakness and hypotension at home.  Complex medical history with recent transition to oral chemotherapy.  Patient is hypoxic requiring oxygen.  Objectively he does not appear short of breath and does not have Rales or diminished breath sounds.  Main complaint is for weakness with poor oral intake.  With constellation of hypotension tachycardia and hypoxia will initiate broad diagnostic evaluation including for sepsis and PE.   Arby Barrette, MD 05/29/22 2249

## 2022-05-29 NOTE — ED Notes (Signed)
RT called to pts room to assess pt d/t low sats. Pt sats upon arrival to room 81% w/good waveform on RA. RT placed pt on [place] 3 Lpm to start and then titrated up until pt at sat goal of >91%. Pt currently on [place] 5 Lpm w/humidity. RT will continue to monitor while at [facility] ED.

## 2022-05-29 NOTE — ED Triage Notes (Addendum)
Pt is currently receiving treatment for peritoneal cancer.states she was feeling nauseated yesterday with one episode of vomiting and had one episode of vomiting today. States that her b/p was low at home.  Current b/p 103/62. HR 112.  C/o of feeling a little dizzy. Denies chest pain or shortness of breath at this time. Pt appeared a little sob when transfer from wheelchair to bed. Took zofran 8mg  at 1900. States hit did help her nausea. Denies nausea at present. Was just concerned with her lower b/p. Denies any cough or fevers. Taking po chemo. Pt's sats between 85- 93 on RA. Asked RT to see pt. Dr. Vanessa Kick  aware of pt.

## 2022-05-29 NOTE — ED Notes (Signed)
Dr. Judd Lien aware that we were able to collect only one set of blood cultures. No further orders noted.

## 2022-05-30 ENCOUNTER — Inpatient Hospital Stay (HOSPITAL_COMMUNITY): Payer: Medicare HMO

## 2022-05-30 ENCOUNTER — Other Ambulatory Visit: Payer: Self-pay

## 2022-05-30 ENCOUNTER — Emergency Department (HOSPITAL_BASED_OUTPATIENT_CLINIC_OR_DEPARTMENT_OTHER): Payer: Medicare HMO

## 2022-05-30 ENCOUNTER — Encounter (HOSPITAL_COMMUNITY): Payer: Self-pay | Admitting: Family Medicine

## 2022-05-30 DIAGNOSIS — R112 Nausea with vomiting, unspecified: Secondary | ICD-10-CM | POA: Diagnosis not present

## 2022-05-30 DIAGNOSIS — Z87891 Personal history of nicotine dependence: Secondary | ICD-10-CM | POA: Diagnosis not present

## 2022-05-30 DIAGNOSIS — E860 Dehydration: Secondary | ICD-10-CM | POA: Diagnosis not present

## 2022-05-30 DIAGNOSIS — N179 Acute kidney failure, unspecified: Secondary | ICD-10-CM | POA: Diagnosis present

## 2022-05-30 DIAGNOSIS — K2270 Barrett's esophagus without dysplasia: Secondary | ICD-10-CM | POA: Diagnosis not present

## 2022-05-30 DIAGNOSIS — K529 Noninfective gastroenteritis and colitis, unspecified: Secondary | ICD-10-CM | POA: Diagnosis not present

## 2022-05-30 DIAGNOSIS — C482 Malignant neoplasm of peritoneum, unspecified: Secondary | ICD-10-CM | POA: Diagnosis not present

## 2022-05-30 DIAGNOSIS — Z932 Ileostomy status: Secondary | ICD-10-CM | POA: Diagnosis not present

## 2022-05-30 DIAGNOSIS — D630 Anemia in neoplastic disease: Secondary | ICD-10-CM | POA: Diagnosis not present

## 2022-05-30 DIAGNOSIS — R197 Diarrhea, unspecified: Secondary | ICD-10-CM | POA: Diagnosis not present

## 2022-05-30 DIAGNOSIS — C786 Secondary malignant neoplasm of retroperitoneum and peritoneum: Secondary | ICD-10-CM | POA: Diagnosis not present

## 2022-05-30 DIAGNOSIS — E8721 Acute metabolic acidosis: Secondary | ICD-10-CM | POA: Diagnosis not present

## 2022-05-30 DIAGNOSIS — K56699 Other intestinal obstruction unspecified as to partial versus complete obstruction: Secondary | ICD-10-CM | POA: Diagnosis not present

## 2022-05-30 DIAGNOSIS — E876 Hypokalemia: Secondary | ICD-10-CM | POA: Diagnosis not present

## 2022-05-30 DIAGNOSIS — J9601 Acute respiratory failure with hypoxia: Secondary | ICD-10-CM | POA: Diagnosis not present

## 2022-05-30 DIAGNOSIS — Z1152 Encounter for screening for COVID-19: Secondary | ICD-10-CM | POA: Diagnosis not present

## 2022-05-30 DIAGNOSIS — E039 Hypothyroidism, unspecified: Secondary | ICD-10-CM | POA: Diagnosis not present

## 2022-05-30 DIAGNOSIS — E871 Hypo-osmolality and hyponatremia: Secondary | ICD-10-CM | POA: Diagnosis not present

## 2022-05-30 DIAGNOSIS — Z79899 Other long term (current) drug therapy: Secondary | ICD-10-CM | POA: Diagnosis not present

## 2022-05-30 DIAGNOSIS — D638 Anemia in other chronic diseases classified elsewhere: Secondary | ICD-10-CM | POA: Diagnosis not present

## 2022-05-30 DIAGNOSIS — Z931 Gastrostomy status: Secondary | ICD-10-CM | POA: Diagnosis not present

## 2022-05-30 DIAGNOSIS — I10 Essential (primary) hypertension: Secondary | ICD-10-CM | POA: Diagnosis not present

## 2022-05-30 DIAGNOSIS — E875 Hyperkalemia: Secondary | ICD-10-CM | POA: Diagnosis not present

## 2022-05-30 DIAGNOSIS — Z9221 Personal history of antineoplastic chemotherapy: Secondary | ICD-10-CM | POA: Diagnosis not present

## 2022-05-30 DIAGNOSIS — R0602 Shortness of breath: Secondary | ICD-10-CM | POA: Diagnosis not present

## 2022-05-30 DIAGNOSIS — R42 Dizziness and giddiness: Secondary | ICD-10-CM | POA: Diagnosis not present

## 2022-05-30 LAB — CBC WITH DIFFERENTIAL/PLATELET
Abs Immature Granulocytes: 0.03 10*3/uL (ref 0.00–0.07)
Basophils Absolute: 0 10*3/uL (ref 0.0–0.1)
Basophils Relative: 1 %
Eosinophils Absolute: 0 10*3/uL (ref 0.0–0.5)
Eosinophils Relative: 0 %
HCT: 38.8 % (ref 36.0–46.0)
Hemoglobin: 13.3 g/dL (ref 12.0–15.0)
Immature Granulocytes: 0 %
Lymphocytes Relative: 9 %
Lymphs Abs: 0.8 10*3/uL (ref 0.7–4.0)
MCH: 35.6 pg — ABNORMAL HIGH (ref 26.0–34.0)
MCHC: 34.3 g/dL (ref 30.0–36.0)
MCV: 103.7 fL — ABNORMAL HIGH (ref 80.0–100.0)
Monocytes Absolute: 0.5 10*3/uL (ref 0.1–1.0)
Monocytes Relative: 6 %
Neutro Abs: 7.4 10*3/uL (ref 1.7–7.7)
Neutrophils Relative %: 84 %
Platelets: 298 10*3/uL (ref 150–400)
RBC: 3.74 MIL/uL — ABNORMAL LOW (ref 3.87–5.11)
RDW: 12.1 % (ref 11.5–15.5)
WBC: 8.8 10*3/uL (ref 4.0–10.5)
nRBC: 0 % (ref 0.0–0.2)

## 2022-05-30 LAB — COMPREHENSIVE METABOLIC PANEL
ALT: 16 U/L (ref 0–44)
AST: 22 U/L (ref 15–41)
Albumin: 4.9 g/dL (ref 3.5–5.0)
Alkaline Phosphatase: 63 U/L (ref 38–126)
Anion gap: 14 (ref 5–15)
BUN: 65 mg/dL — ABNORMAL HIGH (ref 8–23)
CO2: 19 mmol/L — ABNORMAL LOW (ref 22–32)
Calcium: 10.6 mg/dL — ABNORMAL HIGH (ref 8.9–10.3)
Chloride: 94 mmol/L — ABNORMAL LOW (ref 98–111)
Creatinine, Ser: 3.46 mg/dL — ABNORMAL HIGH (ref 0.44–1.00)
GFR, Estimated: 13 mL/min — ABNORMAL LOW (ref >60)
Glucose, Bld: 130 mg/dL — ABNORMAL HIGH (ref 70–99)
Potassium: 6.7 mmol/L (ref 3.5–5.1)
Sodium: 127 mmol/L — ABNORMAL LOW (ref 135–145)
Total Bilirubin: 0.7 mg/dL (ref 0.3–1.2)
Total Protein: 8.2 g/dL — ABNORMAL HIGH (ref 6.5–8.1)

## 2022-05-30 LAB — RESP PANEL BY RT-PCR (RSV, FLU A&B, COVID)  RVPGX2
Influenza A by PCR: NEGATIVE
Influenza B by PCR: NEGATIVE
Resp Syncytial Virus by PCR: NEGATIVE
SARS Coronavirus 2 by RT PCR: NEGATIVE

## 2022-05-30 LAB — URINALYSIS, ROUTINE W REFLEX MICROSCOPIC
Bilirubin Urine: NEGATIVE
Glucose, UA: NEGATIVE mg/dL
Hgb urine dipstick: NEGATIVE
Ketones, ur: NEGATIVE mg/dL
Nitrite: NEGATIVE
Protein, ur: NEGATIVE mg/dL
Specific Gravity, Urine: 1.015 (ref 1.005–1.030)
WBC, UA: >50 WBC/hpf (ref 0–5)
pH: 5 (ref 5.0–8.0)

## 2022-05-30 LAB — BASIC METABOLIC PANEL
Anion gap: 12 (ref 5–15)
BUN: 62 mg/dL — ABNORMAL HIGH (ref 8–23)
CO2: 17 mmol/L — ABNORMAL LOW (ref 22–32)
Calcium: 9 mg/dL (ref 8.9–10.3)
Chloride: 100 mmol/L (ref 98–111)
Creatinine, Ser: 2.99 mg/dL — ABNORMAL HIGH (ref 0.44–1.00)
GFR, Estimated: 15 mL/min — ABNORMAL LOW (ref >60)
Glucose, Bld: 96 mg/dL (ref 70–99)
Potassium: 5.4 mmol/L — ABNORMAL HIGH (ref 3.5–5.1)
Sodium: 129 mmol/L — ABNORMAL LOW (ref 135–145)

## 2022-05-30 LAB — SODIUM, URINE, RANDOM: Sodium, Ur: <10 mmol/L

## 2022-05-30 LAB — BRAIN NATRIURETIC PEPTIDE: B Natriuretic Peptide: 15.3 pg/mL (ref 0.0–100.0)

## 2022-05-30 LAB — CREATININE, URINE, RANDOM: Creatinine, Urine: 118 mg/dL

## 2022-05-30 LAB — TROPONIN I (HIGH SENSITIVITY): Troponin I (High Sensitivity): 7 ng/L (ref <18)

## 2022-05-30 LAB — MAGNESIUM: Magnesium: 2.9 mg/dL — ABNORMAL HIGH (ref 1.7–2.4)

## 2022-05-30 LAB — POTASSIUM: Potassium: 4.5 mmol/L (ref 3.5–5.1)

## 2022-05-30 LAB — LACTIC ACID, PLASMA: Lactic Acid, Venous: 1.8 mmol/L (ref 0.5–1.9)

## 2022-05-30 LAB — PHOSPHORUS: Phosphorus: 6.7 mg/dL — ABNORMAL HIGH (ref 2.5–4.6)

## 2022-05-30 LAB — PROTIME-INR
INR: 1 (ref 0.8–1.2)
Prothrombin Time: 13 seconds (ref 11.4–15.2)

## 2022-05-30 MED ORDER — SODIUM CHLORIDE 0.9 % IV SOLN: Freq: Once | INTRAVENOUS | Status: DC

## 2022-05-30 MED ORDER — HYDRALAZINE HCL 20 MG/ML IJ SOLN: 10.0000 mg | Freq: Four times a day (QID) | INTRAMUSCULAR | Status: DC | PRN

## 2022-05-30 MED ORDER — PROMETHAZINE HCL 25 MG/ML IJ SOLN
INTRAMUSCULAR | Status: AC
  Filled 2022-05-30: qty 1

## 2022-05-30 MED ORDER — SODIUM CHLORIDE 0.9 % IV SOLN: INTRAVENOUS | Status: DC

## 2022-05-30 MED ORDER — ONDANSETRON HCL 4 MG/2ML IJ SOLN: 4.0000 mg | Freq: Four times a day (QID) | INTRAMUSCULAR | Status: DC | PRN

## 2022-05-30 MED ORDER — SODIUM ZIRCONIUM CYCLOSILICATE 10 G PO PACK
10.0000 g | PACK | Freq: Three times a day (TID) | ORAL | Status: DC
  Administered 2022-05-30 (×3): 10 g via ORAL
  Filled 2022-05-30 (×3): qty 1

## 2022-05-30 MED ORDER — SODIUM CHLORIDE 0.9% FLUSH: 10.0000 mL | INTRAVENOUS | Status: DC | PRN

## 2022-05-30 MED ORDER — ACETAMINOPHEN 325 MG PO TABS
650.0000 mg | ORAL_TABLET | Freq: Four times a day (QID) | ORAL | Status: DC | PRN
  Administered 2022-06-01: 650 mg via ORAL

## 2022-05-30 MED ORDER — SODIUM CHLORIDE 0.9 % IV SOLN
12.5000 mg | Freq: Four times a day (QID) | INTRAVENOUS | Status: DC | PRN
  Administered 2022-05-30 (×2): 12.5 mg via INTRAVENOUS
  Filled 2022-05-30: qty 0.5
  Filled 2022-05-30: qty 12.5

## 2022-05-30 MED ORDER — PNEUMOCOCCAL 20-VAL CONJ VACC 0.5 ML IM SUSY: 0.5000 mL | PREFILLED_SYRINGE | INTRAMUSCULAR | Status: DC | PRN

## 2022-05-30 MED ORDER — ACETAMINOPHEN 650 MG RE SUPP: 650.0000 mg | Freq: Four times a day (QID) | RECTAL | Status: DC | PRN

## 2022-05-30 MED ORDER — ALBUTEROL SULFATE (2.5 MG/3ML) 0.083% IN NEBU: 2.5000 mg | INHALATION_SOLUTION | Freq: Four times a day (QID) | RESPIRATORY_TRACT | Status: DC

## 2022-05-30 MED ORDER — ONDANSETRON HCL 4 MG/2ML IJ SOLN
4.0000 mg | Freq: Once | INTRAMUSCULAR | Status: AC
  Administered 2022-05-30: 4 mg via INTRAVENOUS
  Filled 2022-05-30: qty 2

## 2022-05-30 MED ORDER — DEXTROSE 50 % IV SOLN
50.0000 mL | Freq: Once | INTRAVENOUS | Status: AC
  Administered 2022-05-30: 50 mL via INTRAVENOUS
  Filled 2022-05-30: qty 50

## 2022-05-30 MED ORDER — ALBUTEROL SULFATE (2.5 MG/3ML) 0.083% IN NEBU: 2.5000 mg | INHALATION_SOLUTION | Freq: Four times a day (QID) | RESPIRATORY_TRACT | Status: DC | PRN

## 2022-05-30 MED ORDER — CHLORHEXIDINE GLUCONATE CLOTH 2 % EX PADS
6.0000 | MEDICATED_PAD | Freq: Every day | CUTANEOUS | Status: DC
  Administered 2022-05-30 – 2022-06-02 (×4): 6 via TOPICAL

## 2022-05-30 MED ORDER — HEPARIN SODIUM (PORCINE) 5000 UNIT/ML IJ SOLN
5000.0000 [IU] | Freq: Three times a day (TID) | INTRAMUSCULAR | Status: DC
  Administered 2022-05-30 – 2022-06-01 (×5): 5000 [IU] via SUBCUTANEOUS
  Filled 2022-05-30 (×8): qty 1

## 2022-05-30 MED ORDER — SODIUM CHLORIDE 0.9 % IV SOLN: Freq: Once | INTRAVENOUS | Status: AC

## 2022-05-30 MED ORDER — STERILE WATER FOR INJECTION IV SOLN
INTRAVENOUS | Status: DC
  Filled 2022-05-30 (×2): qty 1000
  Filled 2022-05-30: qty 150
  Filled 2022-05-30 (×3): qty 1000
  Filled 2022-05-30: qty 150
  Filled 2022-05-30: qty 1000

## 2022-05-30 MED ORDER — INSULIN ASPART 100 UNIT/ML IV SOLN
10.0000 [IU] | Freq: Once | INTRAVENOUS | Status: AC
  Administered 2022-05-30: 10 [IU] via INTRAVENOUS

## 2022-05-30 MED ORDER — SODIUM CHLORIDE 0.9 % IV BOLUS
2500.0000 mL | Freq: Once | INTRAVENOUS | Status: AC
  Administered 2022-05-30: 2500 mL via INTRAVENOUS

## 2022-05-30 MED ORDER — ALBUTEROL SULFATE (2.5 MG/3ML) 0.083% IN NEBU
2.5000 mg | INHALATION_SOLUTION | Freq: Three times a day (TID) | RESPIRATORY_TRACT | Status: DC
  Filled 2022-05-30: qty 3

## 2022-05-30 NOTE — ED Notes (Addendum)
Dr. Judd Lien aware of K of 6.7 Pt has been on cardiac monitor since arrival.

## 2022-05-30 NOTE — ED Notes (Signed)
Called [facility] to give report and was told they had just been told about the patient and would call me back

## 2022-05-30 NOTE — H&P (Signed)
[facility] History and Physical  Nancy KussmaulSue Marie Catoe ZOX:[id number] DOB: 07/20/1940 DOA: 05/29/2022 PCP: Soundra PilonBrake, Andrew R, FNP  Admitted from: Home Chief Complaint: Nausea, vomiting, weakness  History of Present Illness: Nancy Harrison is a 82 y.o. 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 Truett PernaSherrill. 3/22, started on olaparib 3/28, patient was started on chemotherapy with Bevacizumab 4/6, patient was brought to the ED with 2 days history of right nausea, vomiting, low blood pressure, dizziness and shortness of breath on exertion.  In the ED, patient was tachycardic to 112, blood pressure in low normal range, O2 sat was in 80s requiring up to 4 L oxygen to improve over 90% Initial labs with normal WBC count, normal hemoglobin, normal platelet, normal lactic acid level, sodium low at 127, potassium elevated to 6.7, BUN/creatinine elevated to 65/3.46 Respiratory virus panel unremarkable Blood culture sent Chest x-ray without acute infiltrates EKG with normal sinus rhythm, no significant ST-T wave changes, QTc 441 ms. Patient was started on IV fluid, IV insulin, dextrose, Lokelma, antiemetics Hospitalist service consulted for admission  I received the patient has a caregiver admission from last night. She has been in the hospital for more than 8 hours.  Overnight, afebrile, heart rate in 90s mostly, blood pressure normal range, breathing on 2 L oxygen. Labs with improvement showing sodium 129, potassium 5.4, BUN/creatinine 62/2.9 At the time of my evaluation, patient was lying down in bed.  Not in distress.  Was nauseous and hence did not try breakfast this morning.  Husband at bedside  encouraging.  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   Cataract    Family history of colon cancer    Gastrostomy in place    LUQ - placed 10/16/2021   Hypertension    Ileostomy, has currently    placed 09/2021   SUI (stress urinary incontinence, female)    Thyroid disease    Urinary urgency     Past surgical history: Past Surgical History:  Procedure Laterality Date   BREAST LUMPECTOMY  1999   Chemo and radiation on right breast   IR IMAGING GUIDED PORT INSERTION  11/11/2021   LAPAROSCOPY N/A 10/16/2021   Procedure: LAPAROSCOPIC  DIVERTING OSTOMY, BIOSPY,  G-TUBE PLACEMENT;  Surgeon: Karie SodaGross, Steven, MD;  Location: [facility]S;  Service: General;  Laterality: N/A;   TONSILLECTOMY  1957   TOTAL KNEE ARTHROPLASTY  1999 & 2001    Social History:  reports that she quit smoking about 50 years ago. Her smoking use included cigarettes. She has never used smokeless tobacco. She reports current alcohol use of about 8.0 standard drinks of alcohol per week. She reports that she does not use drugs.  Allergies:  Allergies  Allergen Reactions   Sporanox [Itraconazole] Rash   Sporanox [itraconazole]   Family history:  Family History  Problem Relation Age of Onset   Colon cancer Mother [id number]   Lung cancer Sister    Cancer Maternal Aunt  NOS   Cancer Daughter        appendiceal   Stomach cancer Neg Hx    Rectal cancer Neg Hx    Esophageal cancer Neg Hx      Home Meds: Prior to Admission medications   Medication Sig Start Date End Date Taking? Authorizing Provider  acetaminophen (TYLENOL) 325 MG tablet Take 2 tablets (650 mg total) by mouth every 6 (six) hours as needed for mild pain or fever. 10/22/21   Meuth,  Brooke A, PA-C  diphenoxylate-atropine (LOMOTIL) 2.5-0.025 MG tablet TAKE ONE TABLET BY MOUTH EVERY 12 HOURS AS NEEDED FOR LOOSE STOOLS 02/08/22   Ladene Artist, MD  ibuprofen (ADVIL) 200 MG tablet Take 400 mg by mouth every 6 (six) hours as needed for moderate pain.    [provider]  lisinopril (ZESTRIL) 5 MG tablet Take 5 mg by mouth daily.    [provider]  loperamide (IMODIUM A-D) 2 MG tablet Take 2 tablets (4 mg total) by mouth 4 (four) times daily as needed for diarrhea or loose stools. Patient taking differently: Take 4 mg by mouth in the morning, at noon, and at bedtime. 11/11/21   Ladene Artist, MD  loratadine (CLARITIN) 10 MG tablet Take 10 mg by mouth 2 (two) times daily.    [provider]  magnesium oxide (MAG-OX) 400 (240 Mg) MG tablet TAKE 1 TABLET BY MOUTH 2 TIMES A DAY 05/28/22   Ladene Artist, MD  melatonin 5 MG TABS Take 5 mg by mouth at bedtime.    [provider]  methocarbamol (ROBAXIN) 500 MG tablet Take 1 tablet (500 mg total) by mouth every 8 (eight) hours as needed for muscle spasms. Patient not taking: Reported on 05/20/2022 03/16/22   Ladene Artist, MD  Multiple Vitamin (MULTIVITAMIN PO) Take 1 tablet by mouth daily with breakfast.    [provider]  olaparib ([name]) 150 MG tablet Take 2 tablets (300 mg total) by mouth 2 (two) times daily. Swallow whole. May take with food to decrease nausea and vomiting. 05/03/22   Ladene Artist, MD  ondansetron (ZOFRAN) 8 MG tablet Take 8 mg by mouth every 8 (eight) hours as needed for nausea or vomiting. Patient not taking: Reported on 05/20/2022    Ladene Artist, MD  potassium chloride (MICRO-K) 10 MEQ CR capsule TAKE 1 CAPSULE BY MOUTH TWICE A DAY 04/22/22   Ladene Artist, MD  triamcinolone cream (KENALOG) 0.1 % Apply 1 Application topically daily as needed (rash). Patient not taking: Reported on 05/20/2022    [provider]    Physical Exam: Vitals:    05/30/22 0406 05/30/22 1005 05/30/22 1025 05/30/22 1026  BP:  (!) 90/53 (!) 85/61 (!) 90/53  Pulse:  85    Resp:  17    Temp:  (!) 97.5 F (36.4 C)    TempSrc:  Oral    SpO2:  97%    Weight: 51.7 kg     Height: 4\' 11"  (1.499 m)      Wt Readings from Last 3 Encounters:  05/30/22 51.7 kg  05/20/22 53.5 kg  04/21/22 54 kg   Body mass index is 23.02 kg/m.  General exam: Pleasant, thin built elderly Caucasian female. Skin: No rashes, lesions or ulcers. HEENT: Atraumatic, normocephalic, no obvious bleeding Lungs: Clear to auscultation bilaterally CVS: Regular rate and rhythm, no murmur GI/Abd soft, nontender, nondistended, bowel sound present CNS: Alert, awake, oriented x 3 Psychiatry: Sad affect Extremities: No  pedal edema, no calf tenderness   ------------------------------------------------------------------------------------------------------ Assessment/Plan: Principal Problem:   Acute renal failure  AKI  Acute metabolic acidosis Secondary to postchemotherapy nausea and vomiting as well as use of ACE inhibitors..  Baseline creatinine normal Presented with creatinine elevated to 3.46 and bicarb low at 19.  Gradually improving on IV fluid.  Continue sodium bicarb drip. Continue to monitor labs. Nephro consult appreciated. Recent Labs    12/02/21 1325 12/24/21 0825 01/12/22 0957 02/02/22 1142 02/23/22 1317 03/15/22 1002 04/19/22 1429 05/20/22 1044 05/29/22 2303 05/30/22 0610  BUN 31* 65* 62*  CREATININE 0.58 0.59 0.60 0.51 0.46 0.52 0.54 1.09* 3.46* 2.99*  CO2 19* 17*   Hyperkalemia/hypermagnesemia/hyperphosphatemia Magnesium phosphorus level were all elevated initially.  Probably secondary to renal failure In the ED, patient received IV insulin, IV dextrose, Lokelma.  Potassium level subsequently improved to 5.4 this morning I will repeat 1 dose of Lokelma this morning.  Repeat labs tomorrow. Recent Labs  Lab  05/29/22 2303 05/29/22 2322 05/30/22 0610  K 6.7*  --  5.4*  MG  --  2.9*  --   PHOS  --  6.7*  --    HypOnatremia Low sodium level due to poor oral intake.  Expected to improve with IV fluid Recent Labs  Lab 05/29/22 2303 05/30/22 0610  NA 127* 129*   Acute hypoxic respiratory failure O2 sat low in 80s presentation.  Chest x-ray without infiltrates or edema At this time, there is no evidence of infection or volume overload. Will follow blood culture sent from ED. Continue to monitor oxygen requirement.  Wean down as tolerated.  Essential HTN PTA on lisinopril 5 mg daily which is currently on hold. Continue IV fluid. Continue monitor blood pressure   Primary peritoneal carcinoma with peritoneal carcinomatosis  H/o colonic obstruction s/p diverting ostomy and G-tube in August 2023 Patient is under the care of oncologist Dr. Truett Perna. 3/22, started on olaparib 3/28, patient was started on chemotherapy with Bevacizumab Keep olaparib on hold while she has renal failure Informed Dr. Truett Perna by epic chat  Remote history of right breast cancer  S/p lumpectomy chemo and radiation  Mobility: Encourage ambulation.  Goals of care   Code Status: Full Code    DVT prophylaxis:  heparin injection 5,000 Units Start: 05/30/22 1445   Antimicrobials: None currently Fluid: Sodium bicarb 150 mill per hour Consultants: Oncology Family Communication: Husband at bedside  Dispo: The patient is from: Home              Anticipated d/c is to: Home May need home health.  Diet: Diet Order             DIET SOFT Room service appropriate? Yes; Fluid consistency: Thin  Diet effective now                    ------------------------------------------------------------------------------------- Severity of Illness: The appropriate patient status for this patient is INPATIENT. Inpatient status is judged to be reasonable and necessary in order to provide the required intensity of  service to ensure the patient's safety. The patient's presenting symptoms, physical exam findings, and initial radiographic and laboratory data in the context of their chronic comorbidities is felt to place them at high risk for further clinical deterioration. Furthermore, it is not anticipated that the patient will be medically stable for discharge from the hospital within 2 midnights of admission.   * I certify  that at the point of admission it is my clinical judgment that the patient will require inpatient hospital care spanning beyond 2 midnights from the point of admission due to high intensity of service, high risk for further deterioration and high frequency of surveillance required.* -------------------------------------------------------------------------------------  Labs on Admission:   CBC: Recent Labs  Lab 05/29/22 2303  WBC 8.8  NEUTROABS 7.4  HGB 13.3  HCT 38.8  MCV 103.7*  PLT 298    Basic Metabolic Panel: Recent Labs  Lab 05/29/22 2303 05/29/22 2322 05/30/22 0610  NA 127*  --  129*  K 6.7*  --  5.4*  CL 94*  --  100  CO2 19*  --  17*  GLUCOSE 130*  --  96  BUN 65*  --  62*  CREATININE 3.46*  --  2.99*  CALCIUM 10.6*  --  9.0  MG  --  2.9*  --   PHOS  --  6.7*  --     Liver Function Tests: Recent Labs  Lab 05/29/22 2303  AST 22  ALT 16  ALKPHOS 63  BILITOT 0.7  PROT 8.2*  ALBUMIN 4.9   No results for input(s): "LIPASE", "AMYLASE" in the last 168 hours. No results for input(s): "AMMONIA" in the last 168 hours.  Cardiac Enzymes: No results for input(s): "CKTOTAL", "CKMB", "CKMBINDEX", "TROPONINI" in the last 168 hours.  BNP (last 3 results) Recent Labs    05/29/22 2303  BNP 15.3    ProBNP (last 3 results) No results for input(s): "PROBNP" in the last 8760 hours.  CBG: No results for input(s): "GLUCAP" in the last 168 hours.  Lipase  No results found for: "LIPASE"   Urinalysis No results found for: "COLORURINE", "APPEARANCEUR",  "LABSPEC", "PHURINE", "GLUCOSEU", "HGBUR", "BILIRUBINUR", "KETONESUR", "PROTEINUR", "UROBILINOGEN", "NITRITE", "LEUKOCYTESUR"   Drugs of Abuse  No results found for: "LABOPIA", "COCAINSCRNUR", "LABBENZ", "AMPHETMU", "THCU", "LABBARB"    Radiological Exams on Admission: US RENAL  Result Date: 05/30/2022 CLINICAL DATA:  81 year old female with renal failure. EXAM: RENAL / URINARY TRACT ULTRASOUND COMPLETE COMPARISON:  CT Abdomen and Pelvis 04/19/2022. FINDINGS: Right Kidney: Renal measurements: 7.2 x 4.0 x 3.8 cm = volume: 59 mL. Maintained cortical echogenicity. No right hydronephrosis or solid renal mass. Left kidney size and configuration appears stable since February. Left Kidney: Renal measurements: 9.7 x 5.1 x 4.8 cm = volume: 127 mL. Nephrolithiasis redemonstrated (image 44). No left hydronephrosis. No solid left renal mass. Bladder: Prevoid bladder volume estimated at 221 mL. Patient reported they were unable to void. Unremarkable appearance of the bladder, no urinary debris. Other: None. IMPRESSION: 1. Bladder volume of 221 mL, but patient reported they were unable to void. Query urinary retention. 2. No acute renal finding.  Chronic Left nephrolithiasis. Electronically Signed   By: Odessa Fleming M.D.   On: 05/30/2022 05:50   DG Chest Port 1 View  Result Date: 05/30/2022 CLINICAL DATA:  Patient currently receiving treatment for peritoneal cancer, presenting with dizziness, nausea and vomiting. EXAM: PORTABLE CHEST 1 VIEW COMPARISON:  June 21, 2013 FINDINGS: A right-sided venous Port-A-Cath is seen with its distal tip noted at the junction of the superior vena cava and right atrium. The heart size and mediastinal contours are within normal limits. Low lung volumes are noted. Both lungs are clear. Multiple chronic left-sided rib fractures are seen. There is mild to moderate severity dextroscoliosis of the mid to lower thoracic spine with moderate to marked severity multilevel degenerative changes.  IMPRESSION: Low lung volumes without evidence  of acute or active cardiopulmonary disease. Electronically Signed   By: Aram Candela M.D.   On: 05/30/2022 01:28     Signed, Lorin Glass, MD [facility] 05/30/2022

## 2022-05-30 NOTE — ED Notes (Signed)
Order received by Dr. Judd Lien to d/c second blood culture and 2nd lactic acid.

## 2022-05-30 NOTE — Progress Notes (Signed)
This is an unfortunate 83 year old female with past medical history of hypertension, hypothyroidism, Barrett's esophagus, right breast cancer sp lumpectomy, XRT, and chemotherapy.  She currently has a diagnosis of peritoneal carcinoma which presented as carcinomatosis.  She is sp diverting colostomy with G-tube in place.  She did her last chemotherapy was 05/14/22.  She has been recently started on Olaparib. For the last 2 days she has had nausea with decreased p.o. intake.  She was brought [facility].  At [facility] patient's sodium 127, potassium was 6.7, creatinine 3.46, BUN 65, CO2 19, anion gap normal.  Last creatinine 1.09 on 05/20/2022.  Phosphorus 6.7, magnesium 2.9.  The ER she was given IV insulin and dextrose.  Admission requested  Patient maintained on Lisinopril, KDur and Mag ox. On  ibuprofen PRN Renal ultrasound, IV fluids, strict I/O's, Lokelma ordered.  Patient with no concomitant EKG changes. Patient's Olaparib can cause increased serum creatinine and 34 to 44% per [employer]

## 2022-05-30 NOTE — ED Notes (Signed)
Nausea is better since receiving zofran

## 2022-05-30 NOTE — Progress Notes (Signed)
Patient arrived to [facility] via [facility] from [facility] at 68. GCS 15, vitals stable on 2L [place]. Denies nausea. Skin assessment completed with Selena Batten, RN charge nurse. Ostomy bag intact and emptied. Patient states husband went home for the night to sleep. Admission completed. Education regarding ARF and electrolyte imbalance completed. Educated patient on "call don't fall" and fall contract form signed. Bed in lowest position, safety socks on, call bell in reach. Oliver Barre, RN

## 2022-05-30 NOTE — Consult Note (Addendum)
Renal Service Consult Note [facility]  Nancy Harrison 05/30/2022 Nancy Krabbe, MD Requesting Physician: Dr. Pola Corn  Reason for Consult: Renal failure HPI: The patient is a 82 y.o. year-old w/ PMH as below who presented to ED w/ nausea, vomiting, low BP's, SOB and dizziness. Pt has known primary peritoneal carcinomatosis sp surgical Rx in august 2023 w/ diverting loop ileostomy. Bx showed metastatic poorly differentiated carcinoma. She rec'd chemoRx w/ taxol/ carboplatin sept 23 thru jan 2024. She is under care of Dr Truett Perna. In the last few weeks was started on olaparib and then Bevacizumab. She has several days of N/V and dizziness w/ low BP. In ED HR was 112, BP's soft, WBC nl, Hb nl, LA wnl. K+ 6.7 and creat 3.4 (b/l 1.0).  CXR neg, RV panel negative. Pt was started on IVF's , IV insulin/ dextrose / Ca++ for high K+, anti-emetics. Today creat is down to 2.9, not much UOP, K+ is down to 5.9.  We are asked to see for renal failure.   Pt seen in room, husband at bedside. Not feeling a whole lot better yet. Had LR bolus 500 cc in ED and IVF"s overnight. No abd pain or confusion. She takes lisinopril at home.   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   Cataract    Family history of colon cancer    Gastrostomy in place    LUQ - placed 10/16/2021   Hypertension    Ileostomy, has currently    placed 09/2021   SUI (stress urinary incontinence, female)    Thyroid disease    Urinary urgency    Past Surgical History  Past Surgical History:  Procedure Laterality Date   BREAST LUMPECTOMY  1999   Chemo and radiation on right breast   IR IMAGING GUIDED PORT INSERTION  11/11/2021   LAPAROSCOPY N/A 10/16/2021    Procedure: LAPAROSCOPIC  DIVERTING OSTOMY, BIOSPY,  G-TUBE PLACEMENT;  Surgeon: Karie Soda, MD;  Location: [facility];  Service: General;  Laterality: N/A;   TONSILLECTOMY  1957   TOTAL KNEE ARTHROPLASTY  1999 & 2001   Family History  Family History  Problem Relation Age of Onset   Colon cancer Mother 33   Lung cancer Sister    Cancer Maternal Aunt        NOS   Cancer Daughter        appendiceal   Stomach cancer Neg Hx    Rectal cancer Neg Hx    Esophageal cancer Neg Hx    Social History  reports that she quit smoking about 50 years ago. Her smoking use included cigarettes. She has never used smokeless tobacco. She reports current alcohol use of about 8.0 standard drinks of alcohol per week. She reports that she does not use drugs. Allergies  Allergies  Allergen Reactions   Sporanox [Itraconazole] Rash   Home medications Prior to Admission medications   Medication Sig Start Date End Date Taking? Authorizing Provider  acetaminophen (TYLENOL) 325 MG tablet Take 2 tablets (650 mg total) by mouth every 6 (six) hours as needed for mild pain or fever. 10/22/21  Yes Meuth, Lina Sar, PA-C  diphenoxylate-atropine (LOMOTIL) 2.5-0.025 MG  tablet TAKE ONE TABLET BY MOUTH EVERY 12 HOURS AS NEEDED FOR LOOSE STOOLS Patient taking differently: Take 1 tablet by mouth at bedtime. 02/08/22  Yes Ladene Artist, MD  lisinopril (ZESTRIL) 5 MG tablet Take 5 mg by mouth daily.   Yes [provider]  loperamide (IMODIUM A-D) 2 MG tablet Take 2 tablets (4 mg total) by mouth 4 (four) times daily as needed for diarrhea or loose stools. Patient taking differently: Take 4 mg by mouth in the morning, at noon, and at bedtime. 11/11/21  Yes Ladene Artist, MD  loratadine (CLARITIN) 10 MG tablet Take 10 mg by mouth 2 (two) times daily.   Yes [provider]  magnesium oxide (MAG-OX) 400 (240 Mg) MG tablet TAKE 1 TABLET BY MOUTH 2 TIMES A DAY Patient taking differently: Take 400 mg by mouth 2 (two)  times daily. 05/28/22  Yes Ladene Artist, MD  melatonin 5 MG TABS Take 5 mg by mouth at bedtime.   Yes [provider]  Multiple Vitamin (MULTIVITAMIN PO) Take 1 tablet by mouth daily with breakfast.   Yes [provider]  olaparib ([name]) 150 MG tablet Take 2 tablets (300 mg total) by mouth 2 (two) times daily. Swallow whole. May take with food to decrease nausea and vomiting. 05/03/22  Yes Ladene Artist, MD  potassium chloride (MICRO-K) 10 MEQ CR capsule TAKE 1 CAPSULE BY MOUTH TWICE A DAY 04/22/22  Yes Ladene Artist, MD  methocarbamol (ROBAXIN) 500 MG tablet Take 1 tablet (500 mg total) by mouth every 8 (eight) hours as needed for muscle spasms. Patient not taking: Reported on 05/20/2022 03/16/22   Thornton Papas B, MD     Vitals:   05/30/22 0406 05/30/22 1005 05/30/22 1025 05/30/22 1026  BP:  (!) 90/53 (!) 85/61 (!) 90/53  Pulse:  85    Resp:  17    Temp:  (!) 97.5 F (36.4 C)    TempSrc:  Oral    SpO2:  97%    Weight: 51.7 kg     Height: 4\' 11"  (1.499 m)      Exam Gen alert, no distress, small-framed WF Poor skin turgor Sclera anicteric, throat clear  No jvd or bruits Chest clear bilat to bases, no rales/ wheezing RRR no MRG Abd soft ntnd no mass or ascites +bs, ostomy bag mid abd GU defer MS no joint effusions or deformity Ext no LE or UE edema, no wounds or ulcers Neuro is alert, Ox 3 , nf        Home meds include - lomotil bid prn, lisinopril 5 mg qd, imodium prn, claritin, mg oxide, robaxin, MVI, olaparib, microK 10 bid, prns/ vits/ supps    Na 129  K 5.4  CO2 17  BUn 62  creat 2.9  Ca 9.0   AG 12  AST/ ALT/ tbili wnl     alb 4.9 on admit, Hb 13 on admit   WBC 8K  plt 298  bnp 15    UNa < 10, Ucr 118    UA pend     Renal US - 7.2/ 9.7 cm kidneys w/p hydro or mass  Assessment/ Plan: AKI - b/l creatinine 0.5 in feb 2024.  Creat 3.5 here on admission in setting of recent onset N/V/ dizziness at home and low BP's in ED. Pt takes ACEi at home  and appears sig dry on exam. Urine lytes are c/w prerenal. Suspect AKI due to vol depletion + acei effects.  Hemodynamic hopefully and not ATN. Has not rec'd much IVF yet, will bolus 2.5 L this afternoon and ^bicarb gtt to 150 cc/hr. Serial exam, prob can bolus again this evening if stable. Would avoid all acei/ ARB in the future for this pt at high risk for dehydration w/ ostomy/ cancer/ chemo. Should recover soon. Will follow.  Hyperkalemia - K+ 6.7 on admit, down to 5.4 now after temporizing measures. Renal diet and lokelma until corrected.  Primary peritoneal carcinomatosis - per pmd/ ONC Chronic diarrhea - in ostomy pt, taking round-the-clock imodium per husband      Vinson Moselleob Jacobo Moncrief  MD [facility] 05/30/2022, 1:09 PM  Recent Labs  Lab 05/29/22 2303 05/29/22 2322 05/30/22 0610  HGB 13.3  --   --   ALBUMIN 4.9  --   --   CALCIUM 10.6*  --  9.0  PHOS  --  6.7*  --   CREATININE 3.46*  --  2.99*  K 6.7*  --  5.4*   Inpatient medications:  promethazine       sodium zirconium cyclosilicate  10 g Oral TID    promethazine (PHENERGAN) injection (IM or IVPB) 12.5 mg (05/30/22 1019)   sodium bicarbonate 150 mEq in sterile water 1,150 mL infusion 125 mL/hr at 05/30/22 1040   sodium chloride     ondansetron (ZOFRAN) IV, pneumococcal 20-valent conjugate vaccine, promethazine (PHENERGAN) injection (IM or IVPB), promethazine

## 2022-05-31 DIAGNOSIS — C482 Malignant neoplasm of peritoneum, unspecified: Secondary | ICD-10-CM | POA: Diagnosis not present

## 2022-05-31 DIAGNOSIS — N179 Acute kidney failure, unspecified: Secondary | ICD-10-CM | POA: Diagnosis not present

## 2022-05-31 LAB — BLOOD CULTURE ID PANEL (REFLEXED) - BCID2

## 2022-05-31 LAB — CBC WITH DIFFERENTIAL/PLATELET
Abs Immature Granulocytes: 0.02 10*3/uL (ref 0.00–0.07)
Basophils Absolute: 0.1 10*3/uL (ref 0.0–0.1)
Basophils Relative: 1 %
Eosinophils Absolute: 0.2 10*3/uL (ref 0.0–0.5)
Eosinophils Relative: 3 %
HCT: 27.3 % — ABNORMAL LOW (ref 36.0–46.0)
Hemoglobin: 9.6 g/dL — ABNORMAL LOW (ref 12.0–15.0)
Immature Granulocytes: 0 %
Lymphocytes Relative: 31 %
Lymphs Abs: 1.5 10*3/uL (ref 0.7–4.0)
MCH: 36.2 pg — ABNORMAL HIGH (ref 26.0–34.0)
MCHC: 35.2 g/dL (ref 30.0–36.0)
MCV: 103 fL — ABNORMAL HIGH (ref 80.0–100.0)
Monocytes Absolute: 0.4 10*3/uL (ref 0.1–1.0)
Monocytes Relative: 8 %
Neutro Abs: 2.8 10*3/uL (ref 1.7–7.7)
Neutrophils Relative %: 57 %
Platelets: 185 10*3/uL (ref 150–400)
RBC: 2.65 MIL/uL — ABNORMAL LOW (ref 3.87–5.11)
RDW: 11.9 % (ref 11.5–15.5)
WBC: 4.9 10*3/uL (ref 4.0–10.5)
nRBC: 0 % (ref 0.0–0.2)

## 2022-05-31 LAB — RENAL FUNCTION PANEL
Albumin: 2.6 g/dL — ABNORMAL LOW (ref 3.5–5.0)
Anion gap: 7 (ref 5–15)
BUN: 24 mg/dL — ABNORMAL HIGH (ref 8–23)
CO2: 29 mmol/L (ref 22–32)
Calcium: 7.5 mg/dL — ABNORMAL LOW (ref 8.9–10.3)
Chloride: 100 mmol/L (ref 98–111)
Creatinine, Ser: 0.84 mg/dL (ref 0.44–1.00)
GFR, Estimated: >60 mL/min (ref >60)
Glucose, Bld: 93 mg/dL (ref 70–99)
Phosphorus: 2.5 mg/dL (ref 2.5–4.6)
Potassium: 3.4 mmol/L — ABNORMAL LOW (ref 3.5–5.1)
Sodium: 136 mmol/L (ref 135–145)

## 2022-05-31 LAB — MAGNESIUM: Magnesium: 1.9 mg/dL (ref 1.7–2.4)

## 2022-05-31 LAB — CULTURE, BLOOD (ROUTINE X 2)

## 2022-05-31 NOTE — Progress Notes (Signed)
PHARMACY - PHYSICIAN COMMUNICATION CRITICAL VALUE ALERT - BLOOD CULTURE IDENTIFICATION (BCID)  Nancy Harrison is an 82 y.o. female who presented to [facility] on 05/29/2022 with a chief complaint of nausea, vomiting and weakness  Assessment:  One positive blood culture with staph epidermidis.  Not currently on antibiotics.  Remains afebrile.  Could be contamination  Name of physician (or Provider) Contacted: Dr. Pola Corn  Current antibiotics: None  Changes to prescribed antibiotics recommended: No changes for now   Results for orders placed or performed during the hospital encounter of 05/29/22  Blood Culture ID Panel (Reflexed) (Collected: 05/29/2022 11:22 PM)  Result Value Ref Range   Enterococcus faecalis NOT DETECTED NOT DETECTED   Enterococcus Faecium NOT DETECTED NOT DETECTED   Listeria monocytogenes NOT DETECTED NOT DETECTED   Staphylococcus species DETECTED (A) NOT DETECTED   Staphylococcus aureus (BCID) NOT DETECTED NOT DETECTED   Staphylococcus epidermidis DETECTED (A) NOT DETECTED   Staphylococcus lugdunensis NOT DETECTED NOT DETECTED   Streptococcus species NOT DETECTED NOT DETECTED   Streptococcus agalactiae NOT DETECTED NOT DETECTED   Streptococcus pneumoniae NOT DETECTED NOT DETECTED   Streptococcus pyogenes NOT DETECTED NOT DETECTED   A.calcoaceticus-baumannii NOT DETECTED NOT DETECTED   Bacteroides fragilis NOT DETECTED NOT DETECTED   Enterobacterales NOT DETECTED NOT DETECTED   Enterobacter cloacae complex NOT DETECTED NOT DETECTED   Escherichia coli NOT DETECTED NOT DETECTED   Klebsiella aerogenes NOT DETECTED NOT DETECTED   Klebsiella oxytoca NOT DETECTED NOT DETECTED   Klebsiella pneumoniae NOT DETECTED NOT DETECTED   Proteus species NOT DETECTED NOT DETECTED   Salmonella species NOT DETECTED NOT DETECTED   Serratia marcescens NOT DETECTED NOT DETECTED   Haemophilus influenzae NOT DETECTED NOT DETECTED   Neisseria meningitidis NOT DETECTED NOT DETECTED    Pseudomonas aeruginosa NOT DETECTED NOT DETECTED   Stenotrophomonas maltophilia NOT DETECTED NOT DETECTED   Candida albicans NOT DETECTED NOT DETECTED   Candida auris NOT DETECTED NOT DETECTED   Candida glabrata NOT DETECTED NOT DETECTED   Candida krusei NOT DETECTED NOT DETECTED   Candida parapsilosis NOT DETECTED NOT DETECTED   Candida tropicalis NOT DETECTED NOT DETECTED   Cryptococcus neoformans/gattii NOT DETECTED NOT DETECTED   Methicillin resistance mecA/C NOT DETECTED NOT DETECTED    Mickeal Skinner 05/31/2022  1:57 PM

## 2022-05-31 NOTE — Evaluation (Signed)
Physical Therapy Evaluation Patient Details Name: Nancy Harrison MRN: [id number] DOB: [birth year] Today's Date: 05/31/2022  History of Present Illness  Nancy Harrison is a 82 y.o. female who came to ED on 4/6 with 2 days nausea, vomiting, low blood pressure, dizziness and shortness of breath on exertion. 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 Pt functioning near baseline. Pt able to amb 400' with SPC this date with supervision. Pt reports mild SOB but significantly better than when she came in. Pt with good home set up and support. At this time I don't feel therapy upon d/c is warranted. Acute PT to cont monitor while in hospital.       Recommendations for follow up therapy are one component of a multi-disciplinary discharge planning process, led by the attending physician.  Recommendations may be updated based on patient status, additional functional criteria and insurance authorization.  Follow Up Recommendations       Assistance Recommended at Discharge None  Patient can return home with the following  Assist for transportation;Help with stairs or ramp for entrance    Equipment Recommendations None recommended by PT  Recommendations for Other Services       Functional Status Assessment Patient has had a recent decline in their functional status and demonstrates the ability to make significant improvements in function in a reasonable and predictable amount of time.     Precautions / Restrictions Precautions Precautions: Fall Precaution Comments: chemo port and colostomy Restrictions Weight Bearing Restrictions: No      Mobility  Bed Mobility Overal bed mobility: Independent              General bed mobility comments: HOB slightly elevated    Transfers Overall transfer level: Needs assistance Equipment used: Straight cane Transfers: Sit to/from Stand Sit to Stand: Supervision           General transfer comment: increased time, no LOB    Ambulation/Gait Ambulation/Gait assistance: Supervision Gait Distance (Feet): 400 Feet Assistive device: Straight cane Gait Pattern/deviations: Step-through pattern, Decreased stride length, Decreased stance time - left, Antalgic Gait velocity: dec Gait velocity interpretation: 1.31 - 2.62 ft/sec, indicative of limited community ambulator   General Gait Details: pt with good use of cane, noted L LE antalgia, no LOB, 1 standing rest break  Stairs            Wheelchair Mobility    Modified Rankin (Stroke Patients Only)       Balance Overall balance assessment: Mild deficits observed, not formally tested (needs cane for safe ambulation)                                           Pertinent Vitals/Pain Pain Assessment Pain Assessment: No/denies pain    Home Living Family/patient expects to be discharged to:: Private residence Living Arrangements: Spouse/significant other Available Help at Discharge: Family;Available 24 hours/day (spouse home) Type of Home: House Home Access: Stairs to enter Entrance Stairs-Rails: Right;Left Entrance Stairs-Number of Steps: 6   Home Layout: One level;Laundry or work area in [employer] Equipment: Grab bars - Chartered loss adjuster (2 wheels);Shower seat      Prior Function Prior Level  of Function : Independent/Modified Independent             Mobility Comments: uses straight cane outside of home, no AD in home ADLs Comments: pt reports still driving     Hand Dominance   Dominant Hand: Right    Extremity/Trunk Assessment   Upper Extremity Assessment Upper Extremity Assessment: Overall WFL for tasks assessed    Lower Extremity  Assessment Lower Extremity Assessment: Generalized weakness (pt with noted L antalgia during ambulation)    Cervical / Trunk Assessment Cervical / Trunk Assessment: Normal  Communication   Communication: No difficulties  Cognition Arousal/Alertness: Awake/alert Behavior During Therapy: WFL for tasks assessed/performed Overall Cognitive Status: Within Functional Limits for tasks assessed                                          General Comments General comments (skin integrity, edema, etc.): pt assisted into bathroom, pt independent with tolieting, pericare, and washing hands at sink    Exercises     Assessment/Plan    PT Assessment Patient needs continued PT services  PT Problem List Decreased strength;Decreased activity tolerance;Decreased balance;Decreased mobility       PT Treatment Interventions DME instruction;Gait training;Stair training;Functional mobility training;Therapeutic activities;Therapeutic exercise;Balance training    PT Goals (Current goals can be found in the Care Plan section)  Acute Rehab PT Goals Patient Stated Goal: home this afternoon PT Goal Formulation: With patient Time For Goal Achievement: 06/14/22 Potential to Achieve Goals: Good    Frequency Min 1X/week     Co-evaluation               AM-PAC PT "6 Clicks" Mobility  Outcome Measure Help needed turning from your back to your side while in a flat bed without using bedrails?: None Help needed moving from lying on your back to sitting on the side of a flat bed without using bedrails?: None Help needed moving to and from a bed to a chair (including a wheelchair)?: None Help needed standing up from a chair using your arms (e.g., wheelchair or bedside chair)?: None Help needed to walk in hospital room?: None Help needed climbing 3-5 steps with a railing? : A Little 6 Click Score: 23    End of Session   Activity Tolerance: Patient tolerated treatment well Patient  left: in chair;with call bell/phone within reach Nurse Communication: Mobility status PT Visit Diagnosis: Unsteadiness on feet (R26.81);Muscle weakness (generalized) (M62.81);Difficulty in walking, not elsewhere classified (R26.2)    Time: 9528-4132 PT Time Calculation (min) (ACUTE ONLY): 20 min   Charges:   PT Evaluation $PT Eval Low Complexity: 1 Low          Lewis Shock, PT, DPT Acute Rehabilitation Services Secure chat preferred Office #: [phone]   Iona Hansen 05/31/2022, 1:13 PM

## 2022-05-31 NOTE — Progress Notes (Signed)
IP PROGRESS NOTE  Subjective:   Nancy Harrison is well-known to me with a history of primary peritoneal carcinoma.  She completed a first treatment with bevacizumab on 05/20/2022.  She began olaparib 05/14/2022. She developed increased output of loose stool 4 to 5 days ago.  She had associated nausea.  He presented to the emergency room 05/29/2022.  The creatinine was elevated and her blood pressure was low.  She was admitted for intravenous hydration and further evaluation. She reports feeling better today.  She was seen by nephrology yesterday.  They feel the acute kidney injury was related to an ACE inhibitor volume depletion.  She takes Imodium on a scheduled at home.  Objective: Vital signs in last 24 hours: Blood pressure (!) 135/100, pulse 81, temperature 97.8 F (36.6 C), temperature source Oral, resp. rate 20, height 4\' 11"  (1.499 m), weight 128 lb 12 oz (58.4 kg), SpO2 100 %.  Intake/Output from previous day: 04/07 0701 - 04/08 0700 In: 2651.8 [P.O.:860; I.V.:1741.8; IV Piggyback:50] Out: 1850 [Urine:1650; Stool:200]  Physical Exam:  HEENT: No thrush Lungs: Clear bilaterally Cardiac: Regular rate and rhythm Abdomen: Soft, no apparent ascites, no mass, nontender, right abdomen ileostomy with a small amount of liquid stool Extremities: No leg edema, decreased skin turgor   Portacath/PICC-without erythema  Lab Results: Recent Labs    05/29/22 2303 05/31/22 0317  WBC 8.8 4.9  HGB 13.3 9.6*  HCT 38.8 27.3*  PLT 298 185    BMET Recent Labs    05/30/22 0610 05/30/22 1539 05/31/22 0317  NA 129*  --  136  K 5.4* 4.5 3.4*  CL 100  --  100  CO2 17*  --  29  GLUCOSE 96  --  93  BUN 62*  --  24*  CREATININE 2.99*  --  0.84  CALCIUM 9.0  --  7.5*    Lab Results  Component Value Date   CEA1 2.5 10/18/2021    Studies/Results: US RENAL  Result Date: 05/30/2022 CLINICAL DATA:  82 year old female with renal failure. EXAM: RENAL / URINARY TRACT ULTRASOUND COMPLETE  COMPARISON:  CT Abdomen and Pelvis 04/19/2022. FINDINGS: Right Kidney: Renal measurements: 7.2 x 4.0 x 3.8 cm = volume: 59 mL. Maintained cortical echogenicity. No right hydronephrosis or solid renal mass. Left kidney size and configuration appears stable since February. Left Kidney: Renal measurements: 9.7 x 5.1 x 4.8 cm = volume: 127 mL. Nephrolithiasis redemonstrated (image 44). No left hydronephrosis. No solid left renal mass. Bladder: Prevoid bladder volume estimated at 221 mL. Patient reported they were unable to void. Unremarkable appearance of the bladder, no urinary debris. Other: None. IMPRESSION: 1. Bladder volume of 221 mL, but patient reported they were unable to void. Query urinary retention. 2. No acute renal finding.  Chronic Left nephrolithiasis. Electronically Signed   By: Odessa Fleming M.D.   On: 05/30/2022 05:50   DG Chest Port 1 View  Result Date: 05/30/2022 CLINICAL DATA:  Patient currently receiving treatment for peritoneal cancer, presenting with dizziness, nausea and vomiting. EXAM: PORTABLE CHEST 1 VIEW COMPARISON:  June 21, 2013 FINDINGS: A right-sided venous Port-A-Cath is seen with its distal tip noted at the junction of the superior vena cava and right atrium. The heart size and mediastinal contours are within normal limits. Low lung volumes are noted. Both lungs are clear. Multiple chronic left-sided rib fractures are seen. There is mild to moderate severity dextroscoliosis of the mid to lower thoracic spine with moderate to marked severity multilevel degenerative changes. IMPRESSION:  Low lung volumes without evidence of acute or active cardiopulmonary disease. Electronically Signed   By: Aram Candela M.D.   On: 05/30/2022 01:28    Medications: I have reviewed the patient's current medications.  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  Bone lesions concerning for metastases, right lung nodule Laparoscopy, diverting loop ileostomy, gastrostomy tube placement, peritoneal biopsy 10/16/2021, no evidence of a primary tumor site at the appendix, ovaries, or uterus.  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  CDX2, CK20, and GATA3 negative.  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 Foundation 1-MSS, tumor mutation burden 5, HRD positive-LOH score 34.1% 08/11/2021 CA125 818 CT chest 11/03/2021-10 mm right lower lobe nodule, scattered tiny pulmonary nodules, peritoneal carcinomatosis Cycle 1 Taxol/carboplatin 11/12/2021 Cycle 2 Taxol/carboplatin 12/03/2021 Cycle 3 Taxol/carboplatin 12/24/2021 Bone scan 01/05/2022-negative for metastatic disease Cycle 4 Taxol/carboplatin 01/13/2022 CTs 01/22/2022-reduction in peritoneal carcinomatosis, no evidence of progressive disease stable bilateral lower lobe pulmonary nodules Cycle 5 Taxol/carboplatin 02/03/2022 Cycle 6 Taxol/carboplatin 02/24/2022 CT abdomen/pelvis 04/19/2022-slight decrease in peritoneal carcinomatosis with residual lesions 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   Abdominal distention/pain and diarrhea secondary to #1 3.   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  Lumpectomy and adjuvant CMF x8 followed by radiation 4. Hypertension 5. Hypothyroidism 6. Barrett's esophagus 7. Family history of multiple cancers including appendix, colon, and lung cancer 8.  Hospital admission  11/05/2021 with dehydration/prerenal azotemia secondary to nausea and high output ileostomy 9.  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  Admission 05/29/2022 with dehydration and acute renal injury   Nancy Harrison has a history of primary peritoneal carcinoma.  She was admitted 05/29/2022 with dehydration and acute renal injury.  I suspect the current presentation is related to increased output from the ileostomy.  It is possible she has diarrhea from olaparib.  The renal function has improved. 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  We will consider resuming Olaparib at a reduced dose as an outpatient.  She will need close monitoring of the ileostomy output and renal function while on Olaparib  Recommendations: Hold Olaparib Continue intravenous hydration, advance diet Resume Imodium if she has increased output from the ileostomy Return for outpatient follow-up at the Cancer center as scheduled on 06/09/2022   LOS: 1 day   Thornton Papas, MD   05/31/2022, 7:03 AM

## 2022-05-31 NOTE — Progress Notes (Signed)
PROGRESS NOTE  Nancy Harrison  DOB: 05/06/1940  PCP: Soundra PilonBrake, Andrew R, FNP ZOX:[id number]  DOA: 05/29/2022  LOS: 1 day  Hospital Day: 3  Brief narrative: Nancy Harrison is a 82 y.o. 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 Truett PernaSherrill. 3/22, started on olaparib 3/28, patient was started on chemotherapy with Bevacizumab 4/6, patient was brought to the ED with 2 days history of right nausea, vomiting, low blood pressure, dizziness and shortness of breath on exertion.  In the ED, patient was tachycardic to 112, blood pressure in low normal range, O2 sat was in 80s requiring up to 4 L oxygen to improve over 90% Initial labs with normal WBC count, normal hemoglobin, normal platelet, normal lactic acid level, sodium low at 127, potassium elevated to 6.7, BUN/creatinine elevated to 65/3.46 Respiratory virus panel unremarkable Blood culture sent Chest x-ray without acute infiltrates EKG with normal sinus rhythm, no significant ST-T wave changes, QTc 441 ms. Patient was started on IV fluid, IV insulin, dextrose, Lokelma, antiemetics [facility]  Subjective: Patient was seen and examined this morning.  Pleasant elderly Caucasian female.  Lying on bed.  Not in distress.  Feels better than at presentation.  Still does not have appetite. Labs improved with IV hydration.  Assessment/Plan: AKI  Acute metabolic acidosis Secondary to postchemotherapy nausea and vomiting as well as use of ACE inhibitors.  Baseline creatinine normal. Presented with creatinine elevated to 3.46 and bicarb low at 19.   Both creatinine and bicarb are much improved today with IV hydration.  Nephrology  following. Recent Labs    12/24/21 0825 01/12/22 0957 02/02/22 1142 02/23/22 1317 03/15/22 1002 04/19/22 1429 05/20/22 1044 05/29/22 2303 05/30/22 0610 05/31/22 0317  BUN 18 20 22 21 17 20  31* 65* 62* 24*  CREATININE 0.59 0.60 0.51 0.46 0.52 0.54 1.09* 3.46* 2.99* 0.84  CO2 30 28 26 27 23 28 22  19* 17* 29    Hyperkalemia/hypokalemia hypermagnesemia/hyperphosphatemia Potassium, magnesium phosphorus level were all elevated initially.  Probably secondary to renal failure In the ED, patient received IV insulin, IV dextrose, Lokelma.   Potassium level subsequently improved and is low at 3.4 today.  Defer to nephrology for any replacement Recent Labs  Lab 05/29/22 2303 05/29/22 2322 05/30/22 0610 05/30/22 1539 05/31/22 0317  K 6.7*  --  5.4* 4.5 3.4*  MG  --  2.9*  --   --  1.9  PHOS  --  6.7*  --   --  2.5    HypOnatremia Low sodium level due to poor oral intake.  Improved with IV hydration. Recent Labs  Lab 05/29/22 2303 05/30/22 0610 05/31/22 0317  NA 127* 129* 136    Acute hypoxic respiratory failure O2 sat low in 80s presentation.  Chest x-ray without infiltrates or edema At this time, there is no evidence of infection or volume overload. Will follow blood culture sent from ED. Continue to monitor oxygen requirement.  Wean down as tolerated.  Essential HTN PTA on lisinopril 5 mg daily which is currently on hold.  Blood pressure improving and more in normal range today.   Primary peritoneal carcinoma with peritoneal carcinomatosis  H/o colonic obstruction s/p diverting ostomy and G-tube in August 2023 Patient is under the care  of oncologist Dr. Truett Perna. 3/22, started on olaparib 3/28, patient was started on chemotherapy with Bevacizumab Keep olaparib on hold while she has renal failure Dr. Truett Perna has been notified of admission  Remote history of right breast cancer  S/p lumpectomy chemo and radiation  Mobility: Encourage ambulation.  PT eval  ordered  Goals of care   Code Status: Full Code    DVT prophylaxis:  heparin injection 5,000 Units Start: 05/30/22 2200   Antimicrobials: None currently Fluid: Per nephrology Consultants: Oncology Family Communication: Husband not at bedside today  Dispo: The patient is from: Home              Anticipated d/c is to: Home May need home health.   Scheduled Meds:  Chlorhexidine Gluconate Cloth  6 each Topical Daily   heparin  5,000 Units Subcutaneous Q8H    PRN meds: acetaminophen **OR** acetaminophen, albuterol, hydrALAZINE, ondansetron (ZOFRAN) IV, pneumococcal 20-valent conjugate vaccine, promethazine (PHENERGAN) injection (IM or IVPB), sodium chloride flush   Infusions:   promethazine (PHENERGAN) injection (IM or IVPB) 12.5 mg (05/30/22 1019)   sodium bicarbonate 150 mEq in sterile water 1,150 mL infusion 150 mL/hr at 05/31/22 2957    Diet:  Diet Order             DIET SOFT Room service appropriate? Yes; Fluid consistency: Thin  Diet effective now                   Antimicrobials: Anti-infectives (From admission, onward)    None       Skin assessment:       Nutritional status:  Body mass index is 26 kg/m.          Objective: Vitals:   05/31/22 0414 05/31/22 0924  BP: (!) 135/100 114/67  Pulse: 81 78  Resp: 20 17  Temp: 97.8 F (36.6 C) 98.3 F (36.8 C)  SpO2: 100% 96%    Intake/Output Summary (Last 24 hours) at 05/31/2022 1050 Last data filed at 05/31/2022 0700 Gross per 24 hour  Intake 2451.78 ml  Output 1850 ml  Net 601.78 ml   Filed Weights   05/29/22 2210 05/30/22 0406 05/30/22 2056  Weight: 52.2 kg 51.7 kg 58.4 kg   Weight change: 6.236 kg Body mass index is 26 kg/m.   Physical Exam: General exam: Pleasant, thin built elderly Caucasian female.  Not in distress. Skin: No rashes, lesions or ulcers. HEENT: Atraumatic, normocephalic, no obvious bleeding Lungs: Clear to auscultation bilaterally CVS: Regular rate and  rhythm, no murmur GI/Abd soft, nontender, nondistended, bowel sound present CNS: Alert, awake, oriented x 3 Psychiatry: Sad affect Extremities: No pedal edema, no calf tenderness  Data Review: I have personally reviewed the laboratory data and studies available.  F/u labs  Unresulted Labs (From admission, onward)     Start     Ordered   05/31/22 0500  Basic metabolic panel  Daily,   R      05/30/22 1357   05/31/22 0500  CBC  Daily,   R      05/30/22 1357   05/31/22 0500  Renal function panel  Daily,   R      05/30/22 1147   05/30/22 1149  Urinalysis, Routine w reflex microscopic -Urine, Clean Catch  Add-on,   AD       Question:  Specimen Source  Answer:  Urine, Clean Catch   05/30/22 1148            Total time spent in  review of labs and imaging, patient evaluation, formulation of plan, documentation and communication with family: 45 minutes  Signed, Lorin Glass, MD [facility] 05/31/2022

## 2022-05-31 NOTE — Progress Notes (Signed)
[facility] Progress Note   Subjective:   Seen in room; husband present.  Doing better.   I/Os yest 2.6 / 1.85 UOP and 0.2L stool - net +1.2 for admit.  Cr back to 0.8 this AM.   Objective Vitals:   05/30/22 2045 05/30/22 2056 05/31/22 0414 05/31/22 0924  BP:  112/68 (!) 135/100 114/67  Pulse: 85 86 81 78  Resp: 16 18 20 17   Temp:  99 F (37.2 C) 97.8 F (36.6 C) 98.3 F (36.8 C)  TempSrc:  Oral Oral   SpO2: 99% 99% 100% 96%  Weight:  58.4 kg    Height:  4\' 11"  (1.499 m)     Physical Exam Gen alert, no distress, small-framed WF Poor skin turgor Sclera anicteric, throat clear  No jvd or bruits Chest clear bilat to bases, no rales/ wheezing RRR no MRG Abd soft ntnd no mass or ascites +bs, ostomy bag mid abd MS no joint effusions or deformity Ext no LE or UE edema, no wounds or ulcers Neuro is alert, Ox 3 , nf  Additional Objective Labs: Basic Metabolic Panel: Recent Labs  Lab 05/29/22 2303 05/29/22 2322 05/30/22 0610 05/30/22 1539 05/31/22 0317  NA 127*  --  129*  --  136  K 6.7*  --  5.4* 4.5 3.4*  CL 94*  --  100  --  100  CO2 19*  --  17*  --  29  GLUCOSE 130*  --  96  --  93  BUN 65*  --  62*  --  24*  CREATININE 3.46*  --  2.99*  --  0.84  CALCIUM 10.6*  --  9.0  --  7.5*  PHOS  --  6.7*  --   --  2.5   Liver Function Tests: Recent Labs  Lab 05/29/22 2303 05/31/22 0317  AST 22  --   ALT 16  --   ALKPHOS 63  --   BILITOT 0.7  --   PROT 8.2*  --   ALBUMIN 4.9 2.6*   No results for input(s): "LIPASE", "AMYLASE" in the last 168 hours. CBC: Recent Labs  Lab 05/29/22 2303 05/31/22 0317  WBC 8.8 4.9  NEUTROABS 7.4 2.8  HGB 13.3 9.6*  HCT 38.8 27.3*  MCV 103.7* 103.0*  PLT 298 185   Blood Culture    Component Value Date/Time   SDES  05/29/2022 2322    BLOOD Performed at [facility], [address], [place], [place] [address]    [facility]  05/29/2022 2322    NONE Performed at [facility], [address] [place], [place] [address]    CULT  05/29/2022 2322    NO GROWTH < 24 HOURS Performed at [facility], [address] [place], [place] [address]    REPTSTATUS PENDING 05/29/2022 2322    Cardiac Enzymes: No results for input(s): "CKTOTAL", "CKMB", "CKMBINDEX", "TROPONINI" in the last 168 hours. CBG: No results for input(s): "GLUCAP" in the last 168 hours. Iron Studies: No results for input(s): "IRON", "TIBC", "TRANSFERRIN", "FERRITIN" in the last 72 hours. @lablastinr3 @ Studies/Results: US RENAL  Result Date: 05/30/2022 CLINICAL DATA:  82 year old female with renal failure. EXAM: RENAL / URINARY TRACT ULTRASOUND COMPLETE COMPARISON:  CT Abdomen and Pelvis 04/19/2022. FINDINGS: Right Kidney: Renal measurements: 7.2 x 4.0 x 3.8 cm = volume: 59 mL. Maintained cortical echogenicity. No right hydronephrosis or solid renal mass. Left kidney size and configuration appears stable since February. Left Kidney: Renal  measurements: 9.7 x 5.1 x 4.8 cm = volume: 127 mL. Nephrolithiasis redemonstrated (image 44). No left hydronephrosis. No solid left renal mass. Bladder: Prevoid bladder volume estimated at 221 mL. Patient reported they were unable to void. Unremarkable appearance of the bladder, no urinary debris. Other: None. IMPRESSION: 1. Bladder volume of 221 mL, but patient reported they were unable to void. Query urinary retention. 2. No acute renal finding.  Chronic Left nephrolithiasis. Electronically Signed   By: Odessa Fleming M.D.   On: 05/30/2022 05:50   DG Chest Port 1 View  Result Date: 05/30/2022 CLINICAL DATA:  Patient currently receiving treatment for peritoneal cancer, presenting with dizziness, nausea and vomiting. EXAM: PORTABLE CHEST 1 VIEW COMPARISON:  June 21, 2013 FINDINGS: A right-sided venous Port-A-Cath is seen with its distal tip noted at the junction of the superior vena cava and right atrium. The heart size and mediastinal contours are within normal  limits. Low lung volumes are noted. Both lungs are clear. Multiple chronic left-sided rib fractures are seen. There is mild to moderate severity dextroscoliosis of the mid to lower thoracic spine with moderate to marked severity multilevel degenerative changes. IMPRESSION: Low lung volumes without evidence of acute or active cardiopulmonary disease. Electronically Signed   By: Aram Candela M.D.   On: 05/30/2022 01:28   Medications:  promethazine (PHENERGAN) injection (IM or IVPB) 12.5 mg (05/30/22 1019)   sodium bicarbonate 150 mEq in sterile water 1,150 mL infusion 150 mL/hr at 05/31/22 0651    Chlorhexidine Gluconate Cloth  6 each Topical Daily   heparin  5,000 Units Subcutaneous Q8H    Assessment/ Plan: AKI - b/l creatinine 0.5 in feb 2024.  Creat 3.5 here on admission in setting of recent onset N/V/ dizziness at home and low BP's in ED. Pt takes ACEi at home and appears sig dry on exam. Urine lytes are c/w prerenal. Suspect AKI due to vol depletion + acei effects.  Has improved considerably with additional fluids.  Would avoid all acei/ ARB in the future for this pt at high risk for dehydration w/ ostomy/ cancer/ chemo. Should see additional recovery in the next few days.   Hyperkalemia - K+ 6.7 on admit, now 3.4 after temporizing measures.  No need for renal diet.  Would d/c off KCL supplement and have it checked at her onc f/u in 2 weeks (was taking 10 BID).  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  Please reach out with any concerns.  No need for nephrology f/u.   Estill Bakes MD 05/31/2022, 10:26 AM  [facility] Pager: [phone]

## 2022-05-31 NOTE — Plan of Care (Signed)

## 2022-05-31 NOTE — TOC Initial Note (Signed)
Transition of Care [facility]) - Initial/Assessment Note    Patient Details  Name: Nancy Harrison MRN: [id number] Date of Birth: 1940/05/20  [facility]) CM/SW Contact:    Durenda Guthrie, RN Phone Number: 05/31/2022, 1:28 PM  Clinical Narrative:                  Transition of Care [facility]) Department has reviewed patient and no TOC needs have been identified at this time. We will continue to monitor patient advancement through Interdisciplinary progressions and if new patient needs arise, please place a consult.       Patient Goals and CMS Choice            Expected Discharge Plan and Services                                              Prior Living Arrangements/Services                       Activities of Daily Living Home Assistive Devices/Equipment: Cane (specify quad or straight), Hand-held shower hose, Shower chair with back ADL Screening (condition at time of admission) Patient's cognitive ability adequate to safely complete daily activities?: Yes Is the patient deaf or have difficulty hearing?: No Does the patient have difficulty seeing, even when wearing glasses/contacts?: No Does the patient have difficulty concentrating, remembering, or making decisions?: No Patient able to express need for assistance with ADLs?: Yes Does the patient have difficulty dressing or bathing?: Yes Independently performs ADLs?: No Communication: Independent Dressing (OT): Independent Grooming: Independent Feeding: Independent Bathing: Needs assistance Is this a change from baseline?: Change from baseline, expected to last <3 days Toileting: Needs assistance Is this a change from baseline?: Change from baseline, expected to last <3 days In/Out Bed: Needs assistance Is this a change from baseline?: Change from baseline, expected to last <3 days Walks in Home: Independent with device (comment) (cane) Does the patient have difficulty walking or climbing  stairs?: Yes Weakness of Legs: Both Weakness of Arms/Hands: None  Permission Sought/Granted                  Emotional Assessment              Admission diagnosis:  Hyperkalemia [E87.5] Acute renal failure [N17.9] Acute renal failure, unspecified acute renal failure type [N17.9] Patient Active Problem List   Diagnosis Date Noted   Acute renal failure 05/30/2022   Non-recurrent unilateral inguinal hernia without obstruction or gangrene 04/13/2022   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   Non-recurrent bilateral inguinal hernia without obstruction or gangrene 04/06/2022   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   Genetic testing 12/03/2021   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   Nausea & vomiting 11/06/2021   Hyponatremia 11/06/2021   Family history of colon cancer 11/05/2021   ARF (acute renal failure) 11/05/2021   Irritant contact dermatitis associated with digestive stoma    Gastrostomy tube dysfunction    Irritant contact dermatitis associated with fecal stoma    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 10/17/2021   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 [place] [address] Phone: [phone] Fax: [phone]  [facility] [address] [address] Phone: [phone] Fax: [phone]  [facility] - [place], [place] - [address] [place] [address] Phone: [phone] Fax: [phone]     Social Determinants of Health (SDOH) Social  History: SDOH Screenings   Food Insecurity: No Food Insecurity (05/30/2022)  Housing: Low Risk  (05/30/2022)  Transportation Needs: No Transportation Needs (05/30/2022)  Utilities: Not At Risk (05/30/2022)  Financial Resource Strain: Low Risk  (11/11/2021)  Tobacco Use: Medium Risk (05/30/2022)   SDOH Interventions:     Readmission Risk Interventions     No data to display

## 2022-05-31 NOTE — Plan of Care (Signed)
  Problem: Education: Goal: Knowledge of General Education information will improve Description: Including pain rating scale, medication(s)/side effects and non-pharmacologic comfort measures Outcome: Completed/Met   

## 2022-06-01 ENCOUNTER — Other Ambulatory Visit: Payer: Self-pay | Admitting: *Deleted

## 2022-06-01 DIAGNOSIS — C786 Secondary malignant neoplasm of retroperitoneum and peritoneum: Secondary | ICD-10-CM

## 2022-06-01 DIAGNOSIS — C482 Malignant neoplasm of peritoneum, unspecified: Secondary | ICD-10-CM | POA: Diagnosis not present

## 2022-06-01 DIAGNOSIS — N179 Acute kidney failure, unspecified: Secondary | ICD-10-CM | POA: Diagnosis not present

## 2022-06-01 LAB — BASIC METABOLIC PANEL
Anion gap: 10 (ref 5–15)
BUN: 7 mg/dL — ABNORMAL LOW (ref 8–23)
CO2: 33 mmol/L — ABNORMAL HIGH (ref 22–32)
Calcium: 7.7 mg/dL — ABNORMAL LOW (ref 8.9–10.3)
Chloride: 93 mmol/L — ABNORMAL LOW (ref 98–111)
Creatinine, Ser: 0.64 mg/dL (ref 0.44–1.00)
GFR, Estimated: >60 mL/min (ref >60)
Glucose, Bld: 208 mg/dL — ABNORMAL HIGH (ref 70–99)
Potassium: 2.9 mmol/L — ABNORMAL LOW (ref 3.5–5.1)
Sodium: 136 mmol/L (ref 135–145)

## 2022-06-01 LAB — RENAL FUNCTION PANEL
Albumin: 2.6 g/dL — ABNORMAL LOW (ref 3.5–5.0)
Anion gap: 7 (ref 5–15)
BUN: 9 mg/dL (ref 8–23)
CO2: 36 mmol/L — ABNORMAL HIGH (ref 22–32)
Calcium: 7.4 mg/dL — ABNORMAL LOW (ref 8.9–10.3)
Chloride: 91 mmol/L — ABNORMAL LOW (ref 98–111)
Creatinine, Ser: 0.57 mg/dL (ref 0.44–1.00)
GFR, Estimated: >60 mL/min (ref >60)
Glucose, Bld: 88 mg/dL (ref 70–99)
Phosphorus: 1.7 mg/dL — ABNORMAL LOW (ref 2.5–4.6)
Potassium: 2.4 mmol/L — CL (ref 3.5–5.1)
Sodium: 134 mmol/L — ABNORMAL LOW (ref 135–145)

## 2022-06-01 LAB — CBC
HCT: 27.6 % — ABNORMAL LOW (ref 36.0–46.0)
Hemoglobin: 9.5 g/dL — ABNORMAL LOW (ref 12.0–15.0)
MCH: 36 pg — ABNORMAL HIGH (ref 26.0–34.0)
MCHC: 34.4 g/dL (ref 30.0–36.0)
MCV: 104.5 fL — ABNORMAL HIGH (ref 80.0–100.0)
Platelets: 175 10*3/uL (ref 150–400)
RBC: 2.64 MIL/uL — ABNORMAL LOW (ref 3.87–5.11)
RDW: 11.8 % (ref 11.5–15.5)
WBC: 4.8 10*3/uL (ref 4.0–10.5)
nRBC: 0 % (ref 0.0–0.2)

## 2022-06-01 LAB — CULTURE, BLOOD (ROUTINE X 2)

## 2022-06-01 LAB — MAGNESIUM: Magnesium: 1.5 mg/dL — ABNORMAL LOW (ref 1.7–2.4)

## 2022-06-01 MED ORDER — BOOST PLUS PO LIQD
237.0000 mL | Freq: Three times a day (TID) | ORAL | Status: DC
  Administered 2022-06-01 – 2022-06-02 (×2): 237 mL via ORAL
  Filled 2022-06-01 (×4): qty 237

## 2022-06-01 MED ORDER — POTASSIUM CHLORIDE CRYS ER 20 MEQ PO TBCR
40.0000 meq | EXTENDED_RELEASE_TABLET | Freq: Once | ORAL | Status: AC
  Administered 2022-06-01: 40 meq via ORAL
  Filled 2022-06-01: qty 2

## 2022-06-01 MED ORDER — LOPERAMIDE HCL 2 MG PO CAPS
4.0000 mg | ORAL_CAPSULE | Freq: Three times a day (TID) | ORAL | Status: DC
  Administered 2022-06-01 – 2022-06-02 (×3): 4 mg via ORAL
  Filled 2022-06-01 (×3): qty 2

## 2022-06-01 MED ORDER — SODIUM PHOSPHATES 45 MMOLE/15ML IV SOLN
30.0000 mmol | Freq: Once | INTRAVENOUS | Status: AC
  Administered 2022-06-01: 30 mmol via INTRAVENOUS
  Filled 2022-06-01: qty 10

## 2022-06-01 MED ORDER — MAGNESIUM SULFATE 2 GM/50ML IV SOLN
2.0000 g | Freq: Once | INTRAVENOUS | Status: AC
  Administered 2022-06-01: 2 g via INTRAVENOUS
  Filled 2022-06-01: qty 50

## 2022-06-01 MED ORDER — MIRTAZAPINE 15 MG PO TABS
7.5000 mg | ORAL_TABLET | Freq: Every day | ORAL | Status: DC
  Administered 2022-06-01: 7.5 mg via ORAL
  Filled 2022-06-01: qty 1

## 2022-06-01 MED ORDER — ADULT MULTIVITAMIN W/MINERALS CH
1.0000 | ORAL_TABLET | Freq: Every day | ORAL | Status: DC
  Administered 2022-06-01 – 2022-06-02 (×2): 1 via ORAL
  Filled 2022-06-01 (×2): qty 1

## 2022-06-01 MED ORDER — POTASSIUM CHLORIDE 10 MEQ/100ML IV SOLN
10.0000 meq | INTRAVENOUS | Status: AC
  Administered 2022-06-01 (×4): 10 meq via INTRAVENOUS
  Filled 2022-06-01 (×4): qty 100

## 2022-06-01 NOTE — Progress Notes (Signed)
Mobility Specialist Progress Note   06/01/22 1100  Mobility  Activity Ambulated with assistance in hallway  Level of Assistance Minimal assist, patient does 75% or more  Assistive Device Cane  Distance Ambulated (ft) 210 ft  Activity Response Tolerated well  Mobility Referral Yes  $Mobility charge 1 Mobility   Received in chair having no c/o and agreeable. Pt requiring no physical assist to stand and ambulate in the hallway for the first half but quickly became fatigued and expressed weakness in LE's. Pt needed the railing on while using SPC in opposite hand when ambulating back to the room, x1 LOB upon re-entry of room requiring minA, no incident presented. Returned to chair and encouraged PLB, left call bell in reach and met all needs.    Frederico Hamman Mobility Specialist Please contact via SecureChat or  Rehab office at [phone]

## 2022-06-01 NOTE — Progress Notes (Signed)
PROGRESS NOTE  Nancy Harrison  DOB: [birth year]  PCP: Nancy Pilon, FNP WUJ:[id number]  DOA: 05/29/2022  LOS: 2 days  Hospital Day: 4  Brief narrative: Nancy Harrison is a 82 y.o. 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 Truett Harrison. 3/22, started on olaparib 3/28, patient was started on chemotherapy with Bevacizumab 4/6, patient was brought to the ED with 2 days history of right nausea, vomiting, low blood pressure, dizziness and shortness of breath on exertion.  In the ED, patient was tachycardic to 112, blood pressure in low normal range, O2 sat was in 80s requiring up to 4 L oxygen to improve over 90% Initial labs with normal WBC count, normal hemoglobin, normal platelet, normal lactic acid level, sodium low at 127, potassium elevated to 6.7, BUN/creatinine elevated to 65/3.46 Respiratory virus panel unremarkable Blood culture sent Chest x-ray without acute infiltrates EKG with normal sinus rhythm, no significant ST-T wave changes, QTc 441 ms. Patient was started on IV fluid, IV insulin, dextrose, Lokelma, antiemetics Admitted to [facility] for further management. Nephrology and oncology consultation were obtained.  Subjective: Patient was seen and examined this morning.   Lying on bed.  No new weakness.  Husband at bedside  still has no appetite. Labs this morning with low potassium, magnesium and phosphorus.  True versus error. Replacement given.  Assessment/Plan: AKI  Acute metabolic acidosis Secondary to postchemotherapy nausea and vomiting as well as use of ACE inhibitors.  Baseline creatinine normal. Presented with creatinine elevated to 3.46 and bicarb low at 19.   Both  creatinine and bicarb are much improved today with IV hydration.   Nephrology consult appreciated.  Noted sharp rise in bicarbonate level today.  Taken off IV fluid this morning. Recent Labs    01/12/22 0957 02/02/22 1142 02/23/22 1317 03/15/22 1002 04/19/22 1429 05/20/22 1044 05/29/22 2303 05/30/22 0610 05/31/22 0317 06/01/22 0350  BUN 31* 65* 62* 24* 9  CREATININE 0.60 0.51 0.46 0.52 0.54 1.09* 3.46* 2.99* 0.84 0.57  CO2 19* 17* 29 36*    Hyperkalemia/hypokalemia hypermagnesemia/hyperphosphatemia Potassium, magnesium phosphorus level were all elevated initially.  Probably secondary to renal failure In the ED, patient received IV insulin, IV dextrose, Lokelma.   Labs this morning with low potassium, magnesium and phosphorus level.  Replacement given. Recheck level at 12. Recent Labs  Lab 05/29/22 2303 05/29/22 2322 05/30/22 0610 05/30/22 1539 05/31/22 0317 06/01/22 0350  K 6.7*  --  5.4* 4.5 3.4* 2.4*  MG  --  2.9*  --   --  1.9 1.5*  PHOS  --  6.7*  --   --  2.5 1.7*    HypOnatremia Low sodium level due to poor oral intake.  Improved with IV hydration. Recent Labs  Lab 05/29/22 2303 05/30/22 0610 05/31/22 0317 06/01/22 0350  NA 127* 129* 136 134*    Acute hypoxic respiratory failure O2 sat low in 80s presentation.  Chest x-ray without infiltrates or edema Clinical exam and imaging with no evidence of volume overload, infiltrates or edema. Blood culture sent from ED grew Staph epidermidis in 1 bottle which probably is a contaminant.  Currently stable not on antibiotics. Initially required supplemental oxygen.  Currently not on oxygen  at rest or ambulation.  Essential HTN PTA on lisinopril 5 mg daily which was held because of AKI. I would continue to hold it   Primary peritoneal carcinoma with peritoneal carcinomatosis  H/o colonic obstruction s/p diverting ostomy and G-tube in August 2023 Patient is under the care of  oncologist Dr. Truett Harrison. 3/22, started on olaparib 3/28, patient was started on chemotherapy with Bevacizumab Olaparib remains on hold while she has renal failure Dr. Truett Harrison saw the patient this admission. Patient still has poor appetite, I start her on Remeron 7.5 mg for tonight  Remote history of right breast cancer  S/p lumpectomy chemo and radiation  Mobility: Encourage ambulation.  PT eval obtained.  Able to ambulate.  No follow-up required.  Goals of care   Code Status: Full Code    DVT prophylaxis:  heparin injection 5,000 Units Start: 05/30/22 2200   Antimicrobials: None currently Fluid: Per nephrology Consultants: Oncology Family Communication: Husband at bedside today  Dispo: The patient is from: Home              Anticipated d/c is to: Home this afternoon versus tomorrow depending on labs   Scheduled Meds:  Chlorhexidine Gluconate Cloth  6 each Topical Daily   heparin  5,000 Units Subcutaneous Q8H   mirtazapine  7.5 mg Oral QHS   potassium chloride  40 mEq Oral Once    PRN meds: acetaminophen **OR** acetaminophen, albuterol, hydrALAZINE, ondansetron (ZOFRAN) IV, pneumococcal 20-valent conjugate vaccine, promethazine (PHENERGAN) injection (IM or IVPB), sodium chloride flush   Infusions:   magnesium sulfate bolus IVPB     promethazine (PHENERGAN) injection (IM or IVPB) 12.5 mg (05/30/22 1019)   sodium bicarbonate 150 mEq in sterile water 1,150 mL infusion 150 mL/hr at 06/01/22 0502   sodium phosphate 30 mmol in dextrose 5 % 250 mL infusion 30 mmol (06/01/22 0637)    Diet:  Diet Order             DIET SOFT Room service appropriate? Yes; Fluid consistency: Thin  Diet effective now                   Antimicrobials: Anti-infectives (From admission, onward)    None       Skin assessment:       Nutritional status:  Body mass index is 26 kg/m.          Objective: Vitals:   05/31/22 2007 06/01/22 0505  BP: 131/69 (!) 147/81   Pulse: 75 81  Resp: 20 20  Temp: 98.5 F (36.9 C) 97.9 F (36.6 C)  SpO2: 99% 94%    Intake/Output Summary (Last 24 hours) at 06/01/2022 0955 Last data filed at 06/01/2022 0502 Gross per 24 hour  Intake 3609.71 ml  Output 850 ml  Net 2759.71 ml    Filed Weights   05/29/22 2210 05/30/22 0406 05/30/22 2056  Weight: 52.2 kg 51.7 kg 58.4 kg   Weight change:  Body mass index is 26 kg/m.   Physical Exam: General exam: Pleasant, thin built elderly Caucasian female.  Not in distress. Skin: No rashes, lesions or ulcers. HEENT: Atraumatic, normocephalic, no obvious bleeding Lungs: Clear to auscultation bilaterally CVS: Regular rate and rhythm, no murmur GI/Abd soft, nontender, nondistended, bowel sound present CNS: Alert, awake, oriented x 3 Psychiatry: Mood appropriate Extremities: No pedal edema, no calf tenderness  Data Review: I have personally reviewed the laboratory data and studies available.  F/u labs  [employer] (From admission, onward)     Start  Ordered   06/01/22 1200  Basic metabolic panel  Once-Timed,   TIMED       Question:  Specimen collection method  Answer:  IV Team=IV Team collect   06/01/22 0815   05/31/22 0500  Basic metabolic panel  Daily,   R      05/30/22 1357   05/31/22 0500  CBC  Daily,   R      05/30/22 1357   05/30/22 1149  Urinalysis, Routine w reflex microscopic -Urine, Clean Catch  Add-on,   AD       Question:  Specimen Source  Answer:  Urine, Clean Catch   05/30/22 1148            Total time spent in review of labs and imaging, patient evaluation, formulation of plan, documentation and communication with family: 45 minutes  Signed, Lorin GlassBinaya Gildardo Tickner, MD [facility] 06/01/2022

## 2022-06-01 NOTE — Progress Notes (Signed)
Placed orders and scheduling message for 06/03/22 per Dr. Truett Perna.

## 2022-06-01 NOTE — Progress Notes (Signed)
Initial Nutrition Assessment  DOCUMENTATION CODES:   Not applicable  INTERVENTION:  Continue diet as ordered Boost Plus po TID, each supplement provides 360 kcal and 14 grams of protein MVI with minerals daily Recommend 100mg  thiamine x 5 days to reduce risk of refeeding syndrome  NUTRITION DIAGNOSIS:   Increased nutrient needs related to cancer and cancer related treatments as evidenced by estimated needs.  GOAL:   Patient will meet greater than or equal to 90% of their needs  MONITOR:   PO intake, Supplement acceptance, Labs, Weight trends, I & O's  REASON FOR ASSESSMENT:   Malnutrition Screening Tool    ASSESSMENT:   Pt admitted with n/v/weakness found to have ARF on admission. PMH significant for peritoneal carcinoma with peritoneal carcinomatosis resulting in colonic obstruction, s/p diverting ileostomy + G-tube for venting (09/2021), remote history of breast cancer s/p lumpectomy, chemo and radiation, Barrett's esophagus.  Discharge pending lab stability given pt with multiple electrolyte abnormalities.   Pt sitting up in chair eating lunch at time of visit. She ate 100% of her pudding, about 15% of pot roast and about 40% of her mashed potatoes. This is a typical meal quantity for her. She states that her appetite has been fair but her portion sizes are very minimal.   Dietary recall includes: Breakfast- cereal with peaches or sausage and eggs Lunch- leftovers such as a vegetable soup with little meat Dinner- sometimes eaten out   No further episodes of emesis noted.   She declines significant stool changes via ostomy. Sometimes is thicker or more loose depending on her nutritional intake.   Pt states that her weight has remained stable around 115 lbs without any significant fluctuations. Reviewed weight history. Within the last year, her weight has remained variable between 53-57 kg. Current weight is up to 58.4 kg.   Question whether some electrolyte  abnormalities could be related to refeeding syndrome. Reached out to MD with recommendation for thiamine supplementation.   Medications: imodium, remeron, MVI, IV KCl  Labs: potassium 2.9, BUN 7, phos 1.7, Mg 1.5   NUTRITION - FOCUSED PHYSICAL EXAM:  Flowsheet Row Most Recent Value  Orbital Region No depletion  Upper Arm Region Mild depletion  Thoracic and Lumbar Region No depletion  Buccal Region No depletion  Temple Region No depletion  Clavicle Bone Region Mild depletion  Clavicle and Acromion Bone Region No depletion  Scapular Bone Region No depletion  Dorsal Hand Mild depletion  Patellar Region Mild depletion  Anterior Thigh Region Mild depletion  Posterior Calf Region No depletion  Edema (RD Assessment) None  Hair Reviewed  Eyes Reviewed  Mouth Reviewed  Skin Reviewed  Nails Reviewed       Diet Order:   Diet Order             DIET SOFT Room service appropriate? Yes; Fluid consistency: Thin  Diet effective now                   EDUCATION NEEDS:   Education needs have been addressed  Skin:  Skin Assessment: Reviewed RN Assessment  Last BM:  4/9 ( via ileostomy)  Height:   Ht Readings from Last 1 Encounters:  05/30/22 4\' 11"  (1.499 m)    Weight:   Wt Readings from Last 1 Encounters:  05/30/22 58.4 kg   BMI:  Body mass index is 26 kg/m.  Estimated Nutritional Needs:   Kcal:  1700-1900  Protein:  85-100g  Fluid:  >/=1.7L  Drusilla Kanner, RDN,  LDN Clinical Nutrition

## 2022-06-01 NOTE — Plan of Care (Signed)
  Problem: Health Behavior/Discharge Planning: Goal: Ability to manage health-related needs will improve Outcome: Completed/Met   

## 2022-06-01 NOTE — Progress Notes (Signed)
IP PROGRESS NOTE  Subjective:   Ms. Riesterer reports feeling better.  She would like to go home.  Objective: Vital signs in last 24 hours: Blood pressure 110/63, pulse 88, temperature 98.1 F (36.7 C), temperature source Oral, resp. rate 18, height 4\' 11"  (1.499 m), weight 128 lb 12 oz (58.4 kg), SpO2 98 %.  Intake/Output from previous day: 04/08 0701 - 04/09 0700 In: 3809.7 [P.O.:320; I.V.:3489.7] Out: 850 [Urine:600; Stool:250]  Physical Exam:  HEENT: No thrush Lungs: Clear bilaterally Cardiac: Regular rate and rhythm Abdomen: Soft, no apparent ascites, no mass, nontender, right abdomen ileostomy with good stool  extremities: No leg edema, decreased skin turgor   Portacath/PICC-without erythema  Lab Results: Recent Labs    05/31/22 0317 06/01/22 0350  WBC 4.9 4.8  HGB 9.6* 9.5*  HCT 27.3* 27.6*  PLT 185 175    BMET Recent Labs    05/31/22 0317 06/01/22 0350  NA 136 134*  K 3.4* 2.4*  CL 100 91*  CO2 29 36*  GLUCOSE 93 88  BUN 24* 9  CREATININE 0.84 0.57  CALCIUM 7.5* 7.4*    Lab Results  Component Value Date   CEA1 2.5 10/18/2021    Studies/Results: No results found.  Medications: I have reviewed the patient's current medications.  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  Bone lesions concerning for metastases, right lung nodule Laparoscopy, diverting loop ileostomy, gastrostomy tube placement, peritoneal biopsy 10/16/2021, no evidence of a primary tumor site at the appendix, ovaries, or uterus.  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  CDX2, CK20, and GATA3 negative.  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 Foundation 1-MSS, tumor mutation burden 5, HRD positive-LOH score 34.1% 08/11/2021 CA125 818 CT chest 11/03/2021-10 mm right lower lobe nodule, scattered tiny pulmonary nodules, peritoneal carcinomatosis Cycle 1 Taxol/carboplatin 11/12/2021 Cycle 2 Taxol/carboplatin 12/03/2021 Cycle 3 Taxol/carboplatin 12/24/2021 Bone scan 01/05/2022-negative for metastatic disease Cycle 4 Taxol/carboplatin 01/13/2022 CTs 01/22/2022-reduction in peritoneal carcinomatosis, no evidence of progressive disease stable bilateral lower lobe pulmonary nodules Cycle 5 Taxol/carboplatin 02/03/2022 Cycle 6 Taxol/carboplatin 02/24/2022 CT abdomen/pelvis 04/19/2022-slight decrease in peritoneal carcinomatosis with residual lesions 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   Abdominal distention/pain and diarrhea secondary to #1 3.   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  Lumpectomy and adjuvant CMF x8 followed by radiation 4. Hypertension 5. Hypothyroidism 6. Barrett's esophagus 7. Family history of multiple cancers including appendix, colon, and lung cancer 8.  Hospital admission 11/05/2021 with dehydration/prerenal azotemia secondary to nausea and high output ileostomy 9.  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  Admission 05/29/2022 with dehydration and acute renal injury   Mr. Ignacio has a history of primary peritoneal carcinoma.  She was admitted 05/29/2022 with dehydration and acute renal injury.  I suspect the current presentation is related to increased output from the ileostomy.  It is possible she has diarrhea from olaparib, though she denies diarrhea prior to hospital  admission.  The renal function has improved.  She has multiple electrolyte abnormalities  today. 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  We will consider resuming Olaparib at a reduced dose as an outpatient.  She will need close monitoring of the ileostomy output and renal function while on Olaparib  Recommendations: Hold Olaparib Supplement electrolytes, advance diet Resume scheduled Imodium Return for outpatient follow-up at the Cancer center as scheduled on 06/09/2022   LOS: 2 days   Thornton Papas, MD   06/01/2022, 1:39 PMIP PROGRESS NOTE     06/01/2022, 6:43 AM

## 2022-06-02 DIAGNOSIS — N179 Acute kidney failure, unspecified: Secondary | ICD-10-CM | POA: Diagnosis not present

## 2022-06-02 LAB — CULTURE, BLOOD (ROUTINE X 2)

## 2022-06-02 LAB — BASIC METABOLIC PANEL
Anion gap: 9 (ref 5–15)
BUN: <5 mg/dL — ABNORMAL LOW (ref 8–23)
CO2: 29 mmol/L (ref 22–32)
Calcium: 7.9 mg/dL — ABNORMAL LOW (ref 8.9–10.3)
Chloride: 98 mmol/L (ref 98–111)
Creatinine, Ser: 0.62 mg/dL (ref 0.44–1.00)
GFR, Estimated: >60 mL/min (ref >60)
Glucose, Bld: 78 mg/dL (ref 70–99)
Potassium: 3.5 mmol/L (ref 3.5–5.1)
Sodium: 136 mmol/L (ref 135–145)

## 2022-06-02 LAB — CBC
HCT: 28.8 % — ABNORMAL LOW (ref 36.0–46.0)
Hemoglobin: 9.4 g/dL — ABNORMAL LOW (ref 12.0–15.0)
MCH: 35.2 pg — ABNORMAL HIGH (ref 26.0–34.0)
MCHC: 32.6 g/dL (ref 30.0–36.0)
MCV: 107.9 fL — ABNORMAL HIGH (ref 80.0–100.0)
Platelets: 180 10*3/uL (ref 150–400)
RBC: 2.67 MIL/uL — ABNORMAL LOW (ref 3.87–5.11)
RDW: 11.9 % (ref 11.5–15.5)
WBC: 4.9 10*3/uL (ref 4.0–10.5)
nRBC: 0 % (ref 0.0–0.2)

## 2022-06-02 MED ORDER — VITAMIN B-1 100 MG PO TABS: 100.0000 mg | ORAL_TABLET | Freq: Every day | ORAL | 0 refills | Status: AC

## 2022-06-02 MED ORDER — THIAMINE MONONITRATE 100 MG PO TABS
100.0000 mg | ORAL_TABLET | Freq: Every day | ORAL | Status: DC
  Administered 2022-06-02: 100 mg via ORAL
  Filled 2022-06-02: qty 1

## 2022-06-02 MED ORDER — HEPARIN SOD (PORK) LOCK FLUSH 100 UNIT/ML IV SOLN
500.0000 [IU] | INTRAVENOUS | Status: AC | PRN
  Administered 2022-06-02: 500 [IU]

## 2022-06-02 MED ORDER — BOOST PLUS PO LIQD: 237.0000 mL | Freq: Three times a day (TID) | ORAL | 0 refills | Status: AC

## 2022-06-02 MED ORDER — MIRTAZAPINE 7.5 MG PO TABS: 7.5000 mg | ORAL_TABLET | Freq: Every day | ORAL | 0 refills | Status: DC

## 2022-06-02 NOTE — Progress Notes (Signed)
Chest Port deaccessed by IV team. AVS instruction given. Volunteer accompanied pt to main entrance. Where ride was waiting.

## 2022-06-02 NOTE — Discharge Summary (Signed)
Physician Discharge Summary  Nancy Harrison ZOX:[id number] DOB: February 01, 1941 DOA: 05/29/2022  PCP: Soundra Pilon, FNP  Admit date: 05/29/2022 Discharge date: 06/02/2022  Admitted From: Home Discharge disposition: Home  Recommendations at discharge:  Encourage oral intake at home. Monitor ostomy output.  Use Imodium as needed. Follow-up with oncology as an outpatient.  Brief narrative: Nancy Harrison is a 82 y.o. 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 Truett Perna. 3/22, started on olaparib 3/28, patient was started on chemotherapy with Bevacizumab 4/6, patient was brought to the ED with 2 days history of right nausea, vomiting, low blood pressure, dizziness and shortness of breath on exertion.  In the ED, patient was tachycardic to 112, blood pressure in low normal range, O2 sat was in 80s requiring up to 4 L oxygen to improve over 90% Initial labs with normal WBC count, normal hemoglobin, normal platelet, normal lactic acid level, sodium low at 127, potassium elevated to 6.7, BUN/creatinine elevated to 65/3.46 Respiratory virus panel unremarkable Blood culture sent Chest x-ray without acute infiltrates EKG with normal sinus rhythm, no significant ST-T wave changes, QTc 441 ms. Patient was started on IV fluid, IV insulin, dextrose, Lokelma, antiemetics Admitted to [facility] for further management. Nephrology and oncology consultation were obtained.  Subjective: Patient was seen and examined this morning.  Lying down in bed.  Feels to have more energy.  Appetite is still poor but gradually improving.  Ostomy output has become more soft. Husband at bedside.  Both agreeable for discharge  today.  Hospital course: AKI  Acute metabolic acidosis Secondary to postchemotherapy nausea and vomiting, ostomy output as well as use of ACE inhibitors.  Baseline creatinine normal. Presented with creatinine elevated to 3.46 and bicarb low at 19.   Treated with IV hydration.  Nephrology consult appreciated. Both creatinine and bicarb have much improved Ostomy output has improved with as needed Imodium.  Continue the same at home. Recent Labs    02/23/22 1317 03/15/22 1002 04/19/22 1429 05/20/22 1044 05/29/22 2303 05/30/22 0610 05/31/22 0317 06/01/22 0350 06/01/22 1305 06/02/22 0436  BUN 21 17 20  31* 65* 62* 24* 9 7* <5*  CREATININE 0.46 0.52 0.54 1.09* 3.46* 2.99* 0.84 0.57 0.64 0.62  CO2 27 23 28 22  19* 17* 29 36* 33* 29   Hyperkalemia/hypokalemia Hypermagnesemia/hyperphosphatemia Potassium, magnesium and phosphorus level were all elevated initially.  Probably secondary to renal failure In the ED, patient received IV insulin, IV dextrose, Lokelma.   With improvement in renal failure, potassium level went down and fell as low as 2.4.  Replacement was given. Lab trend as below.   Patient to continue potassium supplement at home as before. She has a follow-up with Dr. Truett Perna tomorrow for labs. Recent Labs  Lab 05/29/22 2322 05/30/22 0610 05/30/22 1539 05/31/22 0317 06/01/22 0350 06/01/22 1305 06/02/22 0436  K  --    < > 4.5 3.4* 2.4* 2.9* 3.5  MG 2.9*  --   --  1.9 1.5*  --   --   PHOS 6.7*  --   --  2.5 1.7*  --   --    < > = values in this interval not displayed.   HypOnatremia Low sodium level due to poor oral intake.  Improved with IV hydration.  Recent Labs  Lab 05/29/22 2303 05/30/22 0610 05/31/22 0317 06/01/22 0350 06/01/22 1305 06/02/22 0436  NA 127* 129* 136 134* 136 136   Acute hypoxic respiratory failure O2 sat low in 80s presentation.  Chest x-ray without infiltrates or edema Clinical exam and imaging with no evidence of volume overload,  infiltrates or edema. Blood culture sent from ED grew Staph epidermidis in 1 bottle which probably is a contaminant.  Currently stable not on antibiotics. Initially required supplemental oxygen.  Currently not on oxygen at rest or ambulation.  Essential HTN PTA on lisinopril 5 mg daily which was held because of AKI. Continue to hold at discharge.   Primary peritoneal carcinoma with peritoneal carcinomatosis  H/o colonic obstruction s/p diverting ostomy and G-tube in August 2023 Patient is under the care of oncologist Dr. Truett Perna. 3/22, started on olaparib 3/28, patient was started on chemotherapy with Bevacizumab Olaparib remains on hold while she has renal failure.  Hold till next evaluation by oncology as an outpatient. Patient still has poor appetite, I started her on Remeron 7.5 mg for tonight. Follow-up with Dr. Truett Perna as an outpatient  Remote history of right breast cancer  S/p lumpectomy chemo and radiation  Mobility: Encourage ambulation.  PT eval obtained.  Able to ambulate.  No follow-up required.  Goals of care   Code Status: Full Code   Wounds:  - Incision - 4 Ports Abdomen Mid;Right Right;Upper;Lateral Right;Mid;Lateral Right;Lower;Lateral (Active)  Placement Date/Time: 10/16/21 1805   Location of Ports: Abdomen  Location Orientation: Mid;Right  Location Orientation: Right;Upper;Lateral  Location Orientation: Right;Mid;Lateral  Location Orientation: Right;Lower;Lateral    Assessments 10/16/2021  8:14 PM 11/07/2021  7:45 PM  Port 1 Dressing Type Adhesive bandage None  Port 1 Dressing Status Clean, Dry, Intact --  Port 2 Dressing Type Gauze (Comment) None  Port 2 Dressing Status Clean, Dry, Intact --  Port 3 Dressing Type Gauze (Comment) None  Port 3 Dressing Status Clean, Dry, Intact --  Port 4 Dressing Type Gauze (Comment) None  Port 4 Dressing Status Old drainage (marked) --     No associated orders.    Discharge Exam:   Vitals:   06/01/22 1654 06/01/22  1952 06/02/22 0536 06/02/22 0959  BP: 103/63 112/67 (!) 145/68 100/62  Pulse: 84 87 84 86  Resp: 16 18  17   Temp: 97.9 F (36.6 C) 98.3 F (36.8 C) 97.8 F (36.6 C) 98.2 F (36.8 C)  TempSrc: Oral Oral Oral   SpO2: 100% 98% 98% 98%  Weight:      Height:        Body mass index is 26 kg/m.  General exam: Pleasant, thin built elderly Caucasian female.  Not in distress. Skin: No rashes, lesions or ulcers. HEENT: Atraumatic, normocephalic, no obvious bleeding Lungs: Clear to auscultation bilaterally CVS: Regular rate and rhythm, no murmur GI/Abd soft, nontender, nondistended, bowel sound present CNS: Alert, awake, oriented x 3 Psychiatry: Mood appropriate Extremities: No pedal edema, no calf tenderness  Follow ups:    Follow-up Information     Soundra Pilon, FNP Follow up.   Specialty: Family Medicine Contact information: [address] [place] [address] [phone]         Ladene Artist, MD Follow up.   Specialty: Oncology Contact information: [address] [place] [address] [phone]                 Discharge Instructions:   Discharge Instructions     Call MD for:  difficulty breathing, headache or visual disturbances   Complete by: As directed    Call MD for:  extreme fatigue   Complete by: As directed    Call MD for:  hives   Complete by: As directed    Call MD for:  persistant dizziness or light-headedness   Complete by: As directed    Call MD for:  persistant nausea and vomiting   Complete by: As directed    Call MD for:  severe uncontrolled pain   Complete by: As directed    Call MD for:  temperature >100.4   Complete by: As directed    Diet general   Complete by: As directed    Discharge instructions   Complete by: As directed    Recommendations at discharge:   Encourage oral intake at home.  Monitor ostomy output.  Use Imodium as needed.  Follow-up with oncology as an outpatient.  General  discharge instructions: Follow with Primary MD Soundra PilonBrake, Andrew R, FNP in 7 days  Please request your PCP  to go over your hospital tests, procedures, radiology results at the follow up. Please get your medicines reviewed and adjusted.  Your PCP may decide to repeat certain labs or tests as needed. Do not drive, operate heavy machinery, perform activities at heights, swimming or participation in water activities or provide baby sitting services if your were admitted for syncope or siezures until you have seen by Primary MD or a Neurologist and advised to do so again. [place] Controlled Substance Reporting System database was reviewed. Do not drive, operate heavy machinery, perform activities at heights, swim, participate in water activities or provide baby-sitting services while on medications for pain, sleep and mood until your outpatient physician has reevaluated you and advised to do so again.  You are strongly recommended to comply with the dose, frequency and duration of prescribed medications. Activity: As tolerated with Full fall precautions use walker/cane & assistance as needed Avoid using any recreational substances like cigarette, tobacco, alcohol, or non-prescribed drug. If you experience worsening of your admission symptoms, develop shortness of breath, life threatening emergency, suicidal or homicidal thoughts you must seek medical attention immediately by calling 911 or calling your MD immediately  if symptoms less severe. You must read complete instructions/literature along with all the possible adverse reactions/side effects for all the medicines you take and that have been prescribed to you. Take any new medicine only after you have completely understood and accepted all the possible adverse reactions/side effects.  Wear Seat belts while driving. You were cared for by a hospitalist during your hospital stay. If you have any questions about your discharge medications or the care you  received while you were in the hospital after you are discharged, you can call the unit and ask to speak with the hospitalist or the covering physician. Once you are discharged, your primary care physician will handle any further medical issues. Please note that NO REFILLS for any discharge medications will be authorized once you are discharged, as it is imperative that you return to your primary care physician (or establish a relationship with a primary care physician if you do not have one).   Increase activity slowly   Complete by: As directed        Discharge Medications:   Allergies as of 06/02/2022       Reactions   Sporanox [itraconazole] Rash        Medication List     STOP taking these medications  lisinopril 5 MG tablet Commonly known as: ZESTRIL   olaparib 150 MG tablet Commonly known as: [name]       TAKE these medications    acetaminophen 325 MG tablet Commonly known as: TYLENOL Take 2 tablets (650 mg total) by mouth every 6 (six) hours as needed for mild pain or fever.   diphenoxylate-atropine 2.5-0.025 MG tablet Commonly known as: LOMOTIL TAKE ONE TABLET BY MOUTH EVERY 12 HOURS AS NEEDED FOR LOOSE STOOLS What changed: See the new instructions.   lactose free nutrition Liqd Take 237 mLs by mouth 3 (three) times daily with meals.   loperamide 2 MG tablet Commonly known as: IMODIUM A-D Take 2 tablets (4 mg total) by mouth 4 (four) times daily as needed for diarrhea or loose stools. What changed: when to take this   loratadine 10 MG tablet Commonly known as: CLARITIN Take 10 mg by mouth 2 (two) times daily.   magnesium oxide 400 (240 Mg) MG tablet Commonly known as: MAG-OX TAKE 1 TABLET BY MOUTH 2 TIMES A DAY   melatonin 5 MG Tabs Take 5 mg by mouth at bedtime.   methocarbamol 500 MG tablet Commonly known as: ROBAXIN Take 1 tablet (500 mg total) by mouth every 8 (eight) hours as needed for muscle spasms.   mirtazapine 7.5 MG  tablet Commonly known as: REMERON Take 1 tablet (7.5 mg total) by mouth at bedtime.   MULTIVITAMIN PO Take 1 tablet by mouth daily with breakfast.   potassium chloride 10 MEQ CR capsule Commonly known as: MICRO-K TAKE 1 CAPSULE BY MOUTH TWICE A DAY   thiamine 100 MG tablet Commonly known as: Vitamin B-1 Take 1 tablet (100 mg total) by mouth daily. Start taking on: June 03, 2022         The results of significant diagnostics from this hospitalization (including imaging, microbiology, ancillary and laboratory) are listed below for reference.    Procedures and Diagnostic Studies:   US RENAL  Result Date: 05/30/2022 CLINICAL DATA:  82 year old female with renal failure. EXAM: RENAL / URINARY TRACT ULTRASOUND COMPLETE COMPARISON:  CT Abdomen and Pelvis 04/19/2022. FINDINGS: Right Kidney: Renal measurements: 7.2 x 4.0 x 3.8 cm = volume: 59 mL. Maintained cortical echogenicity. No right hydronephrosis or solid renal mass. Left kidney size and configuration appears stable since February. Left Kidney: Renal measurements: 9.7 x 5.1 x 4.8 cm = volume: 127 mL. Nephrolithiasis redemonstrated (image 44). No left hydronephrosis. No solid left renal mass. Bladder: Prevoid bladder volume estimated at 221 mL. Patient reported they were unable to void. Unremarkable appearance of the bladder, no urinary debris. Other: None. IMPRESSION: 1. Bladder volume of 221 mL, but patient reported they were unable to void. Query urinary retention. 2. No acute renal finding.  Chronic Left nephrolithiasis. Electronically Signed   By: Odessa Fleming M.D.   On: 05/30/2022 05:50   DG Chest Port 1 View  Result Date: 05/30/2022 CLINICAL DATA:  Patient currently receiving treatment for peritoneal cancer, presenting with dizziness, nausea and vomiting. EXAM: PORTABLE CHEST 1 VIEW COMPARISON:  June 21, 2013 FINDINGS: A right-sided venous Port-A-Cath is seen with its distal tip noted at the junction of the superior vena cava and right  atrium. The heart size and mediastinal contours are within normal limits. Low lung volumes are noted. Both lungs are clear. Multiple chronic left-sided rib fractures are seen. There is mild to moderate severity dextroscoliosis of the mid to lower thoracic spine with moderate to marked severity multilevel degenerative changes. IMPRESSION: Low  lung volumes without evidence of acute or active cardiopulmonary disease. Electronically Signed   By: Aram Candela M.D.   On: 05/30/2022 01:28     Labs:   Basic Metabolic Panel: Recent Labs  Lab 05/29/22 2322 05/30/22 0610 05/30/22 1539 05/31/22 0317 06/01/22 0350 06/01/22 1305 06/02/22 0436  NA  --  129*  --  136 134* 136 136  K  --  5.4*   < > 3.4* 2.4* 2.9* 3.5  CL  --  100  --  100 91* 93* 98  CO2  --  17*  --  29 36* 33* 29  GLUCOSE  --  96  --  93 88 208* 78  BUN  --  62*  --  24* 9 7* <5*  CREATININE  --  2.99*  --  0.84 0.57 0.64 0.62  CALCIUM  --  9.0  --  7.5* 7.4* 7.7* 7.9*  MG 2.9*  --   --  1.9 1.5*  --   --   PHOS 6.7*  --   --  2.5 1.7*  --   --    < > = values in this interval not displayed.   GFR Estimated Creatinine Clearance: 44.4 mL/min (by C-G formula based on SCr of 0.62 mg/dL). Liver Function Tests: Recent Labs  Lab 05/29/22 2303 05/31/22 0317 06/01/22 0350  AST 22  --   --   ALT 16  --   --   ALKPHOS 63  --   --   BILITOT 0.7  --   --   PROT 8.2*  --   --   ALBUMIN 4.9 2.6* 2.6*   No results for input(s): "LIPASE", "AMYLASE" in the last 168 hours. No results for input(s): "AMMONIA" in the last 168 hours. Coagulation profile Recent Labs  Lab 05/29/22 2322  INR 1.0    CBC: Recent Labs  Lab 05/29/22 2303 05/31/22 0317 06/01/22 0350 06/02/22 0436  WBC 8.8 4.9 4.8 4.9  NEUTROABS 7.4 2.8  --   --   HGB 13.3 9.6* 9.5* 9.4*  HCT 38.8 27.3* 27.6* 28.8*  MCV 103.7* 103.0* 104.5* 107.9*  PLT 298 185 175 180   Cardiac Enzymes: No results for input(s): "CKTOTAL", "CKMB", "CKMBINDEX", "TROPONINI"  in the last 168 hours. BNP: Invalid input(s): "POCBNP" CBG: No results for input(s): "GLUCAP" in the last 168 hours. D-Dimer No results for input(s): "DDIMER" in the last 72 hours. Hgb A1c No results for input(s): "HGBA1C" in the last 72 hours. Lipid Profile No results for input(s): "CHOL", "HDL", "LDLCALC", "TRIG", "CHOLHDL", "LDLDIRECT" in the last 72 hours. Thyroid function studies No results for input(s): "TSH", "T4TOTAL", "T3FREE", "THYROIDAB" in the last 72 hours.  Invalid input(s): "FREET3" Anemia work up No results for input(s): "VITAMINB12", "FOLATE", "FERRITIN", "TIBC", "IRON", "RETICCTPCT" in the last 72 hours. Microbiology Recent Results (from the past 240 hour(s))  Culture, blood (Routine x 2)     Status: Abnormal   Collection Time: 05/29/22 11:22 PM   Specimen: BLOOD  Result Value Ref Range Status   Specimen Description   Final    BLOOD Performed at [facility], [address] [place], [place] [address]    Special Requests   Final    NONE Performed at [facility], [address], [place], [place] [address]    Culture  Setup Time   Final    ANAEROBIC BOTTLE ONLY GRAM POSITIVE COCCI IN CLUSTERS CRITICAL RESULT CALLED TO, READ BACK BY AND VERIFIED WITH:  C/  PHARMD J. FRENS 05/31/22 1350 A. LAFRANCE    Culture (A)  Final    STAPHYLOCOCCUS EPIDERMIDIS THE SIGNIFICANCE OF ISOLATING THIS ORGANISM FROM A SINGLE VENIPUNCTURE CANNOT BE PREDICTED WITHOUT FURTHER CLINICAL AND CULTURE CORRELATION. SUSCEPTIBILITIES AVAILABLE ONLY ON REQUEST. Performed at [facility], [address], [place], [place] [address]    Report Status 06/02/2022 FINAL  Final  Blood Culture ID Panel (Reflexed)     Status: Abnormal   Collection Time: 05/29/22 11:22 PM  Result Value Ref Range Status   Enterococcus faecalis NOT DETECTED NOT DETECTED Final   Enterococcus Faecium NOT DETECTED NOT DETECTED Final   Listeria monocytogenes NOT DETECTED  NOT DETECTED Final   Staphylococcus species DETECTED (A) NOT DETECTED Final    Comment: CRITICAL RESULT CALLED TO, READ BACK BY AND VERIFIED WITH:  C/ PHARMD J. FRENS 05/31/22 1350 A. LAFRANCE    Staphylococcus aureus (BCID) NOT DETECTED NOT DETECTED Final   Staphylococcus epidermidis DETECTED (A) NOT DETECTED Final    Comment: CRITICAL RESULT CALLED TO, READ BACK BY AND VERIFIED WITH:  C/ PHARMD J. FRENS 05/31/22 1350 A. LAFRANCE    Staphylococcus lugdunensis NOT DETECTED NOT DETECTED Final   Streptococcus species NOT DETECTED NOT DETECTED Final   Streptococcus agalactiae NOT DETECTED NOT DETECTED Final   Streptococcus pneumoniae NOT DETECTED NOT DETECTED Final   Streptococcus pyogenes NOT DETECTED NOT DETECTED Final   A.calcoaceticus-baumannii NOT DETECTED NOT DETECTED Final   Bacteroides fragilis NOT DETECTED NOT DETECTED Final   Enterobacterales NOT DETECTED NOT DETECTED Final   Enterobacter cloacae complex NOT DETECTED NOT DETECTED Final   Escherichia coli NOT DETECTED NOT DETECTED Final   Klebsiella aerogenes NOT DETECTED NOT DETECTED Final   Klebsiella oxytoca NOT DETECTED NOT DETECTED Final   Klebsiella pneumoniae NOT DETECTED NOT DETECTED Final   Proteus species NOT DETECTED NOT DETECTED Final   Salmonella species NOT DETECTED NOT DETECTED Final   Serratia marcescens NOT DETECTED NOT DETECTED Final   Haemophilus influenzae NOT DETECTED NOT DETECTED Final   Neisseria meningitidis NOT DETECTED NOT DETECTED Final   Pseudomonas aeruginosa NOT DETECTED NOT DETECTED Final   Stenotrophomonas maltophilia NOT DETECTED NOT DETECTED Final   Candida albicans NOT DETECTED NOT DETECTED Final   Candida auris NOT DETECTED NOT DETECTED Final   Candida glabrata NOT DETECTED NOT DETECTED Final   Candida krusei NOT DETECTED NOT DETECTED Final   Candida parapsilosis NOT DETECTED NOT DETECTED Final   Candida tropicalis NOT DETECTED NOT DETECTED Final   Cryptococcus neoformans/gattii NOT  DETECTED NOT DETECTED Final   Methicillin resistance mecA/C NOT DETECTED NOT DETECTED Final    Comment: Performed at [facility], [address] [place], [place] [address]  Resp panel by RT-PCR (RSV, Flu A&B, Covid) Anterior Nasal Swab     Status: None   Collection Time: 05/29/22 11:39 PM   Specimen: Anterior Nasal Swab  Result Value Ref Range Status   SARS Coronavirus 2 by RT PCR NEGATIVE NEGATIVE Final    Comment: (NOTE) SARS-CoV-2 target nucleic acids are NOT DETECTED.  The SARS-CoV-2 RNA is generally detectable in upper respiratory specimens during the acute phase of infection. The lowest concentration of SARS-CoV-2 viral copies this assay can detect is 138 copies/mL. A negative result does not preclude SARS-Cov-2 infection and should not be used as the sole basis for treatment or other patient management decisions. A negative result may occur with  improper specimen collection/handling, submission of specimen other than nasopharyngeal swab, presence of viral mutation(s)  within the areas targeted by this assay, and inadequate number of viral copies(<138 copies/mL). A negative result must be combined with clinical observations, patient history, and epidemiological information. The expected result is Negative.  Fact Sheet for Patients:  BloggerCourse.com  Fact Sheet for Healthcare Providers:  SeriousBroker.it  This test is no t yet approved or cleared by the [place] FDA and  has been authorized for detection and/or diagnosis of SARS-CoV-2 by FDA under an Emergency Use Authorization (EUA). This EUA will remain  in effect (meaning this test can be used) for the duration of the COVID-19 declaration under Section 564(b)(1) of the Act, 21 U.S.C.section 360bbb-3(b)(1), unless the authorization is terminated  or revoked sooner.       Influenza A by PCR NEGATIVE NEGATIVE Final   Influenza B by PCR NEGATIVE NEGATIVE  Final    Comment: (NOTE) The Xpert Xpress SARS-CoV-2/FLU/RSV plus assay is intended as an aid in the diagnosis of influenza from Nasopharyngeal swab specimens and should not be used as a sole basis for treatment. Nasal washings and aspirates are unacceptable for Xpert Xpress SARS-CoV-2/FLU/RSV testing.  Fact Sheet for Patients: BloggerCourse.com  Fact Sheet for Healthcare Providers: SeriousBroker.it  This test is not yet approved or cleared by the [place] FDA and has been authorized for detection and/or diagnosis of SARS-CoV-2 by FDA under an Emergency Use Authorization (EUA). This EUA will remain in effect (meaning this test can be used) for the duration of the COVID-19 declaration under Section 564(b)(1) of the Act, 21 U.S.C. section 360bbb-3(b)(1), unless the authorization is terminated or revoked.     Resp Syncytial Virus by PCR NEGATIVE NEGATIVE Final    Comment: (NOTE) Fact Sheet for Patients: BloggerCourse.com  Fact Sheet for Healthcare Providers: SeriousBroker.it  This test is not yet approved or cleared by the [place] FDA and has been authorized for detection and/or diagnosis of SARS-CoV-2 by FDA under an Emergency Use Authorization (EUA). This EUA will remain in effect (meaning this test can be used) for the duration of the COVID-19 declaration under Section 564(b)(1) of the Act, 21 U.S.C. section 360bbb-3(b)(1), unless the authorization is terminated or revoked.  Performed at [facility], [address] [place], [place] [address]     Time coordinating discharge: 45 minutes  Signed: Melina Schools Sayyid Harewood  [facility] 06/02/2022, 11:29 AM

## 2022-06-02 NOTE — Care Management Important Message (Signed)
Important Message  Patient Details  Name: Nancy Harrison MRN: [id number] Date of Birth: [birth year]   Medicare Important Message Given:  Yes   Patient left prior to IM delivery a copy will bemail to the patient home address.   Jenee Spaugh 06/02/2022, 1:22 PM

## 2022-06-03 ENCOUNTER — Telehealth: Payer: Self-pay | Admitting: *Deleted

## 2022-06-03 ENCOUNTER — Inpatient Hospital Stay: Payer: Medicare HMO

## 2022-06-03 ENCOUNTER — Inpatient Hospital Stay: Payer: Medicare HMO | Attending: Oncology

## 2022-06-03 ENCOUNTER — Other Ambulatory Visit: Payer: Self-pay | Admitting: *Deleted

## 2022-06-03 DIAGNOSIS — D649 Anemia, unspecified: Secondary | ICD-10-CM | POA: Diagnosis not present

## 2022-06-03 DIAGNOSIS — Z5112 Encounter for antineoplastic immunotherapy: Secondary | ICD-10-CM | POA: Diagnosis present

## 2022-06-03 DIAGNOSIS — R519 Headache, unspecified: Secondary | ICD-10-CM | POA: Diagnosis not present

## 2022-06-03 DIAGNOSIS — C481 Malignant neoplasm of specified parts of peritoneum: Secondary | ICD-10-CM | POA: Diagnosis present

## 2022-06-03 DIAGNOSIS — C786 Secondary malignant neoplasm of retroperitoneum and peritoneum: Secondary | ICD-10-CM

## 2022-06-03 DIAGNOSIS — E860 Dehydration: Secondary | ICD-10-CM | POA: Insufficient documentation

## 2022-06-03 DIAGNOSIS — Z79899 Other long term (current) drug therapy: Secondary | ICD-10-CM | POA: Insufficient documentation

## 2022-06-03 DIAGNOSIS — Z932 Ileostomy status: Secondary | ICD-10-CM | POA: Diagnosis not present

## 2022-06-03 DIAGNOSIS — Z933 Colostomy status: Secondary | ICD-10-CM | POA: Diagnosis not present

## 2022-06-03 LAB — BASIC METABOLIC PANEL - CANCER CENTER ONLY
Anion gap: 6 (ref 5–15)
BUN: 8 mg/dL (ref 8–23)
CO2: 29 mmol/L (ref 22–32)
Calcium: 9 mg/dL (ref 8.9–10.3)
Chloride: 104 mmol/L (ref 98–111)
Creatinine: 0.49 mg/dL (ref 0.44–1.00)
GFR, Estimated: >60 mL/min (ref >60)
Glucose, Bld: 143 mg/dL — ABNORMAL HIGH (ref 70–99)
Potassium: 3.6 mmol/L (ref 3.5–5.1)
Sodium: 139 mmol/L (ref 135–145)

## 2022-06-03 LAB — PHOSPHORUS: Phosphorus: 1.8 mg/dL — ABNORMAL LOW (ref 2.5–4.6)

## 2022-06-03 LAB — MAGNESIUM: Magnesium: 1.5 mg/dL — ABNORMAL LOW (ref 1.7–2.4)

## 2022-06-03 MED ORDER — MAGNESIUM SULFATE 2 GM/50ML IV SOLN
2.0000 g | Freq: Once | INTRAVENOUS | Status: AC
  Administered 2022-06-03: 2 g via INTRAVENOUS
  Filled 2022-06-03: qty 50

## 2022-06-03 MED ORDER — SODIUM CHLORIDE 0.9% FLUSH
10.0000 mL | Freq: Once | INTRAVENOUS | Status: AC
  Administered 2022-06-03: 10 mL via INTRAVENOUS

## 2022-06-03 MED ORDER — SODIUM CHLORIDE 0.9 % IV SOLN: INTRAVENOUS | Status: AC

## 2022-06-03 MED ORDER — K PHOS MONO-SOD PHOS DI & MONO 155-852-130 MG PO TABS
500.0000 mg | ORAL_TABLET | Freq: Once | ORAL | Status: AC
  Administered 2022-06-03: 500 mg via ORAL
  Filled 2022-06-03: qty 2

## 2022-06-03 MED ORDER — HEPARIN SOD (PORK) LOCK FLUSH 100 UNIT/ML IV SOLN
500.0000 [IU] | Freq: Once | INTRAVENOUS | Status: AC
  Administered 2022-06-03: 500 [IU] via INTRAVENOUS

## 2022-06-03 NOTE — Patient Instructions (Addendum)
Rehydration, Older Adult  Rehydration is the replacement of fluids, salts, and minerals in the body (electrolytes) that are lost during dehydration. Dehydration is when there is not enough water or other fluids in the body. This happens when you lose more fluids than you take in. People who are age 80 or older have a higher risk of dehydration than younger adults. This is because in older age, the body: Is less able to maintain the right amount of water. Does not respond to temperature changes as well. Does not get a sense of thirst as easily or quickly. Other causes include: Not drinking enough fluids. This can occur when you are ill, when you forget to drink, or when you are doing activities that require a lot of energy, especially in hot weather. Conditions that cause loss of water or other fluids. These include diarrhea, vomiting, sweating, or urinating a lot. Other illnesses, such as fever or infection. Certain medicines, such as those that remove excess fluid from the body (diuretics). Symptoms of mild or moderate dehydration may include thirst, dry lips and mouth, and dizziness. Symptoms of severe dehydration may include increased heart rate, confusion, fainting, and not urinating. In severe cases, you may need to get fluids through an IV at the hospital. For mild or moderate cases, you can usually rehydrate at home by drinking certain fluids as told by your health care provider. What are the risks? Rehydration is usually safe. Taking in too much fluid (overhydration) can be a problem but is rare. Overhydration can cause an imbalance of electrolytes in the body, kidney failure, fluid in the lungs, or a decrease in salt (sodium) levels in the body. Supplies needed: You will need an oral rehydration solution (ORS) if your health care provider tells you to use one. This is a drink to treat dehydration. It can be found in pharmacies and retail stores. How to rehydrate Fluids Follow  instructions from your health care provider about what to drink. The kind of fluid and the amount you should drink depend on your condition. In general, you should choose drinks that you prefer. If told by your health care provider, drink an ORS. Make an ORS by following instructions on the package. Start by drinking small amounts, about  cup (120 mL) every 5-10 minutes. Slowly increase how much you drink until you have taken in the amount recommended by your health care provider. Drink enough clear fluids to keep your urine pale yellow. If you were told to drink an ORS, finish it first, then start slowly drinking other clear fluids. Drink fluids such as: Water. This includes sparkling and flavored water. Drinking only water can lead to having too little sodium in your body (hyponatremia). Follow the advice of your health care provider. Water from ice chips you suck on. Fruit juice with water added to it(diluted). Sports drinks. Hot or cold herbal teas. Broth-based soups. Coffee. Milk or milk products. Food Follow instructions from your health care provider about what to eat while you rehydrate. Your health care provider may recommend that you slowly begin eating regular foods in small amounts. Eat foods that contain a healthy balance of electrolytes, such as bananas, oranges, potatoes, tomatoes, and spinach. Avoid foods that are greasy or contain a lot of sugar. In some cases, you may get nutrition through a feeding tube that is passed through your nose and into your stomach (nasogastric tube, or NG tube). This may be done if you have uncontrolled vomiting or diarrhea. Drinks to avoid  Certain drinks may make dehydration worse. While you rehydrate, avoid drinking alcohol. How to tell if you are recovering from dehydration You may be getting better if: You are urinating more often than before you started rehydrating. Your urine is pale yellow. Your energy level improves. You vomit less  often. You have diarrhea less often. Your appetite improves or returns to normal. You feel less dizzy or light-headed. Your skin tone and color start to look more normal. Follow these instructions at home: Take over-the-counter and prescription medicines only as told by your health care provider. Do not take sodium tablets. Doing this can lead to having too much sodium in your body (hypernatremia). Contact a health care provider if: You continue to have symptoms of mild or moderate dehydration, such as: Thirst. Dry lips. Slightly dry mouth. Dizziness. Dark urine or less urine than usual. Muscle cramps. You continue to vomit or have diarrhea. Get help right away if: You have symptoms of dehydration that get worse. You have a fever. You have a severe headache. You have been vomiting and have problems, such as: Your vomiting gets worse. Your vomit includes blood or green matter (bile). You cannot eat or drink without vomiting. You have problems with urination or bowel movements, such as: Diarrhea that gets worse. Blood in your stool (feces). This may cause stool to look black and tarry. Not urinating, or urinating only a small amount of very dark urine, within 6-8 hours. You have trouble breathing. You have symptoms that get worse with treatment. These symptoms may be an emergency. Get help right away. Call 911. Do not wait to see if the symptoms will go away. Do not drive yourself to the hospital. This information is not intended to replace advice given to you by your health care provider. Make sure you discuss any questions you have with your health care provider. Document Revised: 06/24/2021 Document Reviewed: 06/22/2021 Elsevier Patient Education  2023 [employer].  Hypomagnesemia Hypomagnesemia is a condition in which the level of magnesium in the blood is too low. Magnesium is a mineral that is found in many foods. It is used in many different processes in the body.  Hypomagnesemia can affect every organ in the body. In severe cases, it can cause life-threatening problems. What are the causes? This condition may be caused by: Not getting enough magnesium in your diet or not having enough healthy foods to eat (malnutrition). Problems with magnesium absorption in the intestines. Dehydration. Excessive use of alcohol. Vomiting. Severe or long-term (chronic) diarrhea. Some medicines, including medicines that make you urinate more often (diuretics). Certain diseases, such as kidney disease, diabetes, celiac disease, and overactive thyroid. What are the signs or symptoms? Symptoms of this condition include: Loss of appetite, nausea, and vomiting. Involuntary shaking or trembling of a body part (tremor). Muscle weakness or tingling in the arms and legs. Sudden tightening of muscles (muscle spasms). Confusion. Psychiatric issues, such as: Depression and irritability. Psychosis. A feeling of fluttering of the heart (palpitations). Seizures. These symptoms are more severe if magnesium levels drop suddenly. How is this diagnosed? This condition may be diagnosed based on: Your symptoms and medical history. A physical exam. Blood and urine tests. How is this treated? Treatment depends on the cause and the severity of the condition. It may be treated by: Taking a magnesium supplement. This can be taken in pill form. If the condition is severe, magnesium is usually given through an IV. Making changes to your diet. You may be directed to  eat foods that have a lot of magnesium, such as green leafy vegetables, peas, beans, and nuts. Not drinking alcohol. If you are struggling not to drink, ask your health care provider for help. Follow these instructions at home: Eating and drinking     Make sure that your diet includes foods with magnesium. Foods that have a lot of magnesium in them include: Green leafy vegetables, such as spinach and broccoli. Beans and  peas. Nuts and seeds, such as almonds and sunflower seeds. Whole grains, such as whole grain bread and fortified cereals. Drink fluids that contain salts and minerals (electrolytes), such as sports drinks, when you are active. Do not drink alcohol. General instructions Take over-the-counter and prescription medicines only as told by your health care provider. Take magnesium supplements as directed if your health care provider tells you to take them. Have your magnesium levels monitored as told by your health care provider. Keep all follow-up visits. This is important. Contact a health care provider if: You get worse instead of better. Your symptoms return. Get help right away if: You develop severe muscle weakness. You have trouble breathing. You feel that your heart is racing. These symptoms may represent a serious problem that is an emergency. Do not wait to see if the symptoms will go away. Get medical help right away. Call your local emergency services (911 in the [place] Do not drive yourself to the hospital. Summary Hypomagnesemia is a condition in which the level of magnesium in the blood is too low. Hypomagnesemia can affect every organ in the body. Treatment may include eating more foods that contain magnesium, taking magnesium supplements, and not drinking alcohol. Have your magnesium levels monitored as told by your health care provider. This information is not intended to replace advice given to you by your health care provider. Make sure you discuss any questions you have with your health care provider. Document Revised: 07/08/2020 Document Reviewed: 07/08/2020 Elsevier Patient Education  2023 Elsevier Inc.   Potassium Phosphate; Sodium Phosphate Tablets What is this medication? POTASSIUM PHOSPHATE; SODIUM PHOSPHATE (poe TASS ee um FOS fate; SOE dee um FOS fate) prevents kidney stones caused by too much calcium in the body. It works by making your urine more acidic, which  prevents the build up of calcium. It may also be used to prevent and treat low phosphorus levels in your body. Phosphorus is a mineral that plays an important role in maintaining the health of your bones, muscles, digestive tract, and nervous system. This medication can also help antibiotics for UTIs work better. This medicine may be used for other purposes; ask your health care provider or pharmacist if you have questions. COMMON BRAND NAME(S): Av-Phos, K-Phos M.F., K-Phos Neutral, K-Phos No 2, Phospha 250 Neutral, Phospho-Trin, Uro-Kp-Neutral, Virt-Phos 250 Neutral What should I tell my care team before I take this medication? They need to know if you have any of these conditions: Addison's disease Dehydration Heart disease High levels of phosphate, potassium, or sodium in the blood Kidney disease Kidney stones Low sodium diet An unusual or allergic reaction to phosphorus, potassium, sodium, other medications, foods, dyes, or preservatives Pregnant or trying to get pregnant Breast-feeding How should I use this medication? Take this medication by mouth with a full glass of water. Take it as directed on the label at the same time every day. Keep taking it unless your care team tells you to stop. Talk to your care team about the use of this medication in children. While this  medication may be prescribed for children as young as 4 years for selected conditions, precautions do apply. Overdosage: If you think you have taken too much of this medicine contact a poison control center or emergency room at once. NOTE: This medicine is only for you. Do not share this medicine with others. What if I miss a dose? If you miss a dose, take it as soon as you can. If it is almost time for your next dose, take only that dose. Do not take double or extra doses. What may interact with this medication? Do not take this medication with any of the following: Burosumab Eplerenone Sevelamer This medication may  also interact with the following: Antacids or other products that contain aluminum, magnesium, or calcium Certain medications for blood pressure or heart disease like lisinopril, losartan, quinapril, valsartan Diuretics such as spironolactone, triamterene Iron supplements Magnesium supplements Medications that lower your chance of fighting infection such as cyclosporine, tacrolimus Other products that contain potassium or phosphorus Salt substitutes Vitamin D supplements This list may not describe all possible interactions. Give your health care provider a list of all the medicines, herbs, non-prescription drugs, or dietary supplements you use. Also tell them if you smoke, drink alcohol, or use illegal drugs. Some items may interact with your medicine. What should I watch for while using this medication? Visit your care team for regular checks on your progress. Your care team may order blood work while you are taking this medication. You may pass a kidney stone after starting this medication. Contact your care team if you have new or unusual symptoms. Follow a healthy diet. Taking this medication does not replace the need for a balanced diet. What side effects may I notice from receiving this medication? Side effects that you should report to your care team as soon as possible: Allergic reactions--skin rash, itching, hives, swelling of the face, lips, tongue, or throat High phosphorus level--muscle pain or cramps, bone or joint pain, numbness and tingling around the mouth High potassium level--muscle weakness, fast or irregular heartbeat High sodium level--confusion, increased thirst, muscle weakness, unusual weakness or fatigue, twitching muscles Side effects that usually do not require medical attention (report to your care team if they continue or are bothersome): Bone or joint pain Diarrhea Dizziness Fatigue Headache Nausea Vomiting This list may not describe all possible side  effects. Call your doctor for medical advice about side effects. You may report side effects to FDA at 1-800-FDA-1088. Where should I keep my medication? Keep out of the reach of children. Store at room temperature between 20 and 25 degrees C (68 and 77 degrees F). Protect from light. Throw away any unused medication after the expiration date. NOTE: This sheet is a summary. It may not cover all possible information. If you have questions about this medicine, talk to your doctor, pharmacist, or health care provider.  2023 Elsevier/Gold Standard (2020-09-08 00:00:00)

## 2022-06-03 NOTE — Telephone Encounter (Signed)
Came to office today for lab work to determine if electrolyte replacement is needed. She is waiting for results in lobby. Has following questions: since olaparib has been stopped, does she still need her infusion appointment w/Avastin on 4/17 and should she keep lab/flush/OV that day? She has also had #2 does of the Remeron--helped her sleep, but she has developed blurred vision since taking it.  Her BP med is on hold and BP has being "all over the place". They will continue to monitor for now. Per Dr. Truett Perna: Informed patient that she needs IVF and 2 grams Mg+ today. Pharmacy will obtain phosphate supplement from ED. MD may restart the olaparib at lower dose, so keep all appointments on 06/09/22.

## 2022-06-03 NOTE — Patient Instructions (Signed)

## 2022-06-04 ENCOUNTER — Other Ambulatory Visit: Payer: Self-pay

## 2022-06-04 DIAGNOSIS — Z932 Ileostomy status: Secondary | ICD-10-CM | POA: Diagnosis not present

## 2022-06-09 ENCOUNTER — Inpatient Hospital Stay: Payer: Medicare HMO | Admitting: Oncology

## 2022-06-09 ENCOUNTER — Inpatient Hospital Stay: Payer: Medicare HMO | Admitting: Nutrition

## 2022-06-09 ENCOUNTER — Inpatient Hospital Stay: Payer: Medicare HMO

## 2022-06-09 ENCOUNTER — Encounter: Payer: Self-pay | Admitting: *Deleted

## 2022-06-09 VITALS — BP 145/86 | HR 88 | Temp 98.2°F | Resp 18 | Ht 59.0 in | Wt 124.0 lb

## 2022-06-09 VITALS — BP 152/67 | HR 85

## 2022-06-09 DIAGNOSIS — D649 Anemia, unspecified: Secondary | ICD-10-CM | POA: Diagnosis not present

## 2022-06-09 DIAGNOSIS — Z933 Colostomy status: Secondary | ICD-10-CM | POA: Diagnosis not present

## 2022-06-09 DIAGNOSIS — C481 Malignant neoplasm of specified parts of peritoneum: Secondary | ICD-10-CM

## 2022-06-09 DIAGNOSIS — Z5112 Encounter for antineoplastic immunotherapy: Secondary | ICD-10-CM | POA: Diagnosis not present

## 2022-06-09 DIAGNOSIS — C786 Secondary malignant neoplasm of retroperitoneum and peritoneum: Secondary | ICD-10-CM

## 2022-06-09 DIAGNOSIS — Z79899 Other long term (current) drug therapy: Secondary | ICD-10-CM | POA: Diagnosis not present

## 2022-06-09 DIAGNOSIS — E860 Dehydration: Secondary | ICD-10-CM | POA: Diagnosis not present

## 2022-06-09 DIAGNOSIS — R519 Headache, unspecified: Secondary | ICD-10-CM | POA: Diagnosis not present

## 2022-06-09 LAB — CMP (CANCER CENTER ONLY)
ALT: 17 U/L (ref 0–44)
AST: 20 U/L (ref 15–41)
Albumin: 3.5 g/dL (ref 3.5–5.0)
Alkaline Phosphatase: 46 U/L (ref 38–126)
Anion gap: 5 (ref 5–15)
BUN: 13 mg/dL (ref 8–23)
CO2: 27 mmol/L (ref 22–32)
Calcium: 9.4 mg/dL (ref 8.9–10.3)
Chloride: 109 mmol/L (ref 98–111)
Creatinine: 0.6 mg/dL (ref 0.44–1.00)
GFR, Estimated: >60 mL/min (ref >60)
Glucose, Bld: 122 mg/dL — ABNORMAL HIGH (ref 70–99)
Potassium: 3.9 mmol/L (ref 3.5–5.1)
Sodium: 141 mmol/L (ref 135–145)
Total Bilirubin: 0.3 mg/dL (ref 0.3–1.2)
Total Protein: 6.1 g/dL — ABNORMAL LOW (ref 6.5–8.1)

## 2022-06-09 LAB — CBC WITH DIFFERENTIAL (CANCER CENTER ONLY)
Abs Immature Granulocytes: 0.01 10*3/uL (ref 0.00–0.07)
Basophils Absolute: 0.1 10*3/uL (ref 0.0–0.1)
Basophils Relative: 2 %
Eosinophils Absolute: 0.3 10*3/uL (ref 0.0–0.5)
Eosinophils Relative: 6 %
HCT: 32.6 % — ABNORMAL LOW (ref 36.0–46.0)
Hemoglobin: 10.8 g/dL — ABNORMAL LOW (ref 12.0–15.0)
Immature Granulocytes: 0 %
Lymphocytes Relative: 29 %
Lymphs Abs: 1.2 10*3/uL (ref 0.7–4.0)
MCH: 35.4 pg — ABNORMAL HIGH (ref 26.0–34.0)
MCHC: 33.1 g/dL (ref 30.0–36.0)
MCV: 106.9 fL — ABNORMAL HIGH (ref 80.0–100.0)
Monocytes Absolute: 0.4 10*3/uL (ref 0.1–1.0)
Monocytes Relative: 10 %
Neutro Abs: 2.2 10*3/uL (ref 1.7–7.7)
Neutrophils Relative %: 53 %
Platelet Count: 273 10*3/uL (ref 150–400)
RBC: 3.05 MIL/uL — ABNORMAL LOW (ref 3.87–5.11)
RDW: 12.7 % (ref 11.5–15.5)
WBC Count: 4.1 10*3/uL (ref 4.0–10.5)
nRBC: 0 % (ref 0.0–0.2)

## 2022-06-09 LAB — MAGNESIUM: Magnesium: 1.7 mg/dL (ref 1.7–2.4)

## 2022-06-09 MED ORDER — HEPARIN SOD (PORK) LOCK FLUSH 100 UNIT/ML IV SOLN
500.0000 [IU] | Freq: Once | INTRAVENOUS | Status: AC | PRN
  Administered 2022-06-09: 500 [IU]

## 2022-06-09 MED ORDER — SODIUM CHLORIDE 0.9 % IV SOLN
15.0000 mg/kg | Freq: Once | INTRAVENOUS | Status: AC
  Administered 2022-06-09: 800 mg via INTRAVENOUS
  Filled 2022-06-09: qty 32

## 2022-06-09 MED ORDER — SODIUM CHLORIDE 0.9 % IV SOLN: Freq: Once | INTRAVENOUS | Status: AC

## 2022-06-09 MED ORDER — SODIUM CHLORIDE 0.9% FLUSH
10.0000 mL | INTRAVENOUS | Status: DC | PRN
  Administered 2022-06-09: 10 mL

## 2022-06-09 NOTE — Progress Notes (Signed)
Patient seen by Dr. Truett Perna today  Vitals are within treatment parameters.No intervention needed for BP 145/86  Labs reviewed by Dr. Truett Perna and are within treatment parameters.OK to treat w/urine protein of 05/30/22 (neg) today.  Per physician team, patient is ready for treatment and there are NO modifications to the treatment plan.

## 2022-06-09 NOTE — Progress Notes (Signed)
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   Discharged from hospital on April 10 after admission with dehydration.  Weight April 17 is 124 #. Weight on last visit, Oct 11 was 127#  Labs noted Glucose 122 and Mag 1.7  Patient reports she feels better and she is tolerating her usual diet. 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 She empties the bag 3-4 times daily. She is trying to drink more liquids. She drinks one bottle of Boost Plus daily.   Nutrition diagnosis: Unintentional wt loss, stable.  Intervention: Educated to increase calories and protein in small meals/snacks daily. Aim to drink 64-80 oz fluids daily. Increase oral nutrition supplements to 2 bottles daily. Suggested try CIB mixed with whole milk or Ensure Complete to increase variety. Provided samples and coupons. Provided nutrition fact sheets on snack ideas.  Monitoring, Evaluation, Goals: Tolerate po intake to minimize wt loss and prevent diarrhea.  Next Visit: Follow as needed.

## 2022-06-09 NOTE — Patient Instructions (Signed)
[facility]  Discharge Instructions: Thank you for choosing [facility] to provide your oncology and hematology care.   If you have a lab appointment with the Cancer Center, please go directly to the Cancer Center and check in at the registration area.   Wear comfortable clothing and clothing appropriate for easy access to any Portacath or PICC line.   We strive to give you quality time with your provider. You may need to reschedule your appointment if you arrive late (15 or more minutes).  Arriving late affects you and other patients whose appointments are after yours.  Also, if you miss three or more appointments without notifying the office, you may be dismissed from the clinic at the provider's discretion.      For prescription refill requests, have your pharmacy contact our office and allow 72 hours for refills to be completed.    Today you received the following chemotherapy and/or immunotherapy agents Avastin      To help prevent nausea and vomiting after your treatment, we encourage you to take your nausea medication as directed.  BELOW ARE SYMPTOMS THAT SHOULD BE REPORTED IMMEDIATELY: *FEVER GREATER THAN 100.4 F (38 C) OR HIGHER *CHILLS OR SWEATING *NAUSEA AND VOMITING THAT IS NOT CONTROLLED WITH YOUR NAUSEA MEDICATION *UNUSUAL SHORTNESS OF BREATH *UNUSUAL BRUISING OR BLEEDING *URINARY PROBLEMS (pain or burning when urinating, or frequent urination) *BOWEL PROBLEMS (unusual diarrhea, constipation, pain near the anus) TENDERNESS IN MOUTH AND THROAT WITH OR WITHOUT PRESENCE OF ULCERS (sore throat, sores in mouth, or a toothache) UNUSUAL RASH, SWELLING OR PAIN  UNUSUAL VAGINAL DISCHARGE OR ITCHING   Items with * indicate a potential emergency and should be followed up as soon as possible or go to the Emergency Department if any problems should occur.  Please show the CHEMOTHERAPY ALERT CARD or IMMUNOTHERAPY ALERT CARD at check-in  to the Emergency Department and triage nurse.  Should you have questions after your visit or need to cancel or reschedule your appointment, please contact [facility]  Dept: [phone]  and follow the prompts.  Office hours are 8:00 a.m. to 4:30 p.m. Monday - Friday. Please note that voicemails left after 4:00 p.m. may not be returned until the following business day.  We are closed weekends and major holidays. You have access to a nurse at all times for urgent questions. Please call the main number to the clinic Dept: [phone] and follow the prompts.   For any non-urgent questions, you may also contact your provider using MyChart. We now offer e-Visits for anyone 18 and older to request care online for non-urgent symptoms. For details visit [email or web address].   Also download the MyChart app! Go to the app store, search "MyChart", open the app, select [facility], and log in with your MyChart username and password.   

## 2022-06-09 NOTE — Progress Notes (Signed)
[facility] OFFICE PROGRESS NOTE   Diagnosis: Peritoneal cancer  INTERVAL HISTORY:   Nancy Harrison was discharged in the hospital 06/02/2022 after admission with dehydration.  She reports feeling better.  She empties the ileostomy 3-4 times daily.  The stool is partially formed.  She is eating and drinking.  She reports a frontal headache for the past 2 months.  The headache is not relieved with ibuprofen or Tylenol.  No sinus drainage.  No bleeding or symptom of thrombosis.  Objective:  Vital signs in last 24 hours:  Blood pressure (!) 145/86, pulse 88, temperature 98.2 F (36.8 C), temperature source Oral, resp. rate 18, height  (1.499 m), weight 124 lb (56.2 kg), SpO2 100 %.    HEENT: No thrush, the mucous membranes are moist, no sinus tenderness Resp: Clear bilaterally Cardio: Regular rate and rhythm GI: No hepatomegaly, right abdomen ileostomy with a small amount of liquid drainage, soft Vascular: No leg edema  Portacath/PICC-without erythema  Lab Results:  Lab Results  Component Value Date   WBC 4.1 06/09/2022   HGB 10.8 (L) 06/09/2022   HCT 32.6 (L) 06/09/2022   MCV 106.9 (H) 06/09/2022   PLT 273 06/09/2022   NEUTROABS 2.2 06/09/2022    CMP  Lab Results  Component Value Date   NA 141 06/09/2022   K 3.9 06/09/2022   CL 109 06/09/2022   CO2 27 06/09/2022   GLUCOSE 122 (H) 06/09/2022   BUN 13 06/09/2022   CREATININE 0.60 06/09/2022   CALCIUM 9.4 06/09/2022   PROT 6.1 (L) 06/09/2022   ALBUMIN 3.5 06/09/2022   AST 20 06/09/2022   ALT 17 06/09/2022   ALKPHOS 46 06/09/2022   BILITOT 0.3 06/09/2022   GFRNONAA >60 06/09/2022    Lab Results  Component Value Date   CEA1 2.5 10/18/2021     Medications: I have reviewed the patient's current medications.   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  Bone lesions concerning for metastases, right lung nodule Laparoscopy, diverting loop ileostomy, gastrostomy tube placement, peritoneal biopsy 10/16/2021, no evidence of a primary tumor site at the appendix, ovaries, or uterus.  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  CDX2, CK20, and GATA3 negative.  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 Foundation 1-MSS, tumor mutation burden 5, HRD positive-LOH score 34.1% 08/11/2021 CA125 818 CT chest 11/03/2021-10 mm right lower lobe nodule, scattered tiny pulmonary nodules, peritoneal carcinomatosis Cycle 1 Taxol/carboplatin 11/12/2021 Cycle 2 Taxol/carboplatin 12/03/2021 Cycle 3 Taxol/carboplatin 12/24/2021 Bone scan 01/05/2022-negative for metastatic disease Cycle 4 Taxol/carboplatin 01/13/2022 CTs 01/22/2022-reduction in peritoneal carcinomatosis, no evidence of progressive disease stable bilateral lower lobe pulmonary nodules Cycle 5 Taxol/carboplatin 02/03/2022 Cycle 6 Taxol/carboplatin 02/24/2022 CT abdomen/pelvis 04/19/2022-slight decrease in peritoneal carcinomatosis with 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   Abdominal distention/pain and diarrhea secondary to #1 3.   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  Lumpectomy and adjuvant CMF x8 followed by radiation  4. Hypertension 5. Hypothyroidism 6. Barrett's  esophagus 7. Family history of multiple cancers including appendix, colon, and lung cancer 8.  Hospital admission 11/05/2021 with dehydration/prerenal azotemia secondary to nausea and high output ileostomy 9.  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 Admission 05/29/2022 with dehydration and acute renal injury     Disposition: [name] appears well.  She was admitted last week with dehydration.  The dehydration appears to be related to lack of oral intake and high output from the ileostomy.  The olaparib may have contributed.  She appears to have recovered from the hospital admission.  The plan is to resume treatment with Olaparib at a dose reduction.  She will receive Avastin today.  She will return for an office visit in 1 week.  She continues to push oral hydration and she takes scheduled Imodium/Lomotil.  The etiology of the headache is unclear.  She is seeing her primary provider tomorrow.  I think it is unlikely the headache is related to the peritoneal carcinoma diagnosis.  Thornton Papas, MD  06/09/2022  11:44 AM

## 2022-06-10 DIAGNOSIS — I10 Essential (primary) hypertension: Secondary | ICD-10-CM | POA: Diagnosis not present

## 2022-06-10 DIAGNOSIS — C786 Secondary malignant neoplasm of retroperitoneum and peritoneum: Secondary | ICD-10-CM | POA: Diagnosis not present

## 2022-06-10 DIAGNOSIS — Z09 Encounter for follow-up examination after completed treatment for conditions other than malignant neoplasm: Secondary | ICD-10-CM | POA: Diagnosis not present

## 2022-06-10 DIAGNOSIS — E46 Unspecified protein-calorie malnutrition: Secondary | ICD-10-CM | POA: Diagnosis not present

## 2022-06-11 ENCOUNTER — Other Ambulatory Visit: Payer: Self-pay

## 2022-06-16 DIAGNOSIS — Z1231 Encounter for screening mammogram for malignant neoplasm of breast: Secondary | ICD-10-CM | POA: Diagnosis not present

## 2022-06-18 ENCOUNTER — Inpatient Hospital Stay: Payer: Medicare HMO

## 2022-06-18 ENCOUNTER — Inpatient Hospital Stay: Payer: Medicare HMO | Admitting: Nurse Practitioner

## 2022-06-18 ENCOUNTER — Encounter: Payer: Self-pay | Admitting: Nurse Practitioner

## 2022-06-18 VITALS — BP 160/78 | HR 98 | Temp 98.1°F | Resp 18 | Ht 59.0 in | Wt 124.6 lb

## 2022-06-18 DIAGNOSIS — Z79899 Other long term (current) drug therapy: Secondary | ICD-10-CM | POA: Diagnosis not present

## 2022-06-18 DIAGNOSIS — Z933 Colostomy status: Secondary | ICD-10-CM | POA: Diagnosis not present

## 2022-06-18 DIAGNOSIS — C786 Secondary malignant neoplasm of retroperitoneum and peritoneum: Secondary | ICD-10-CM | POA: Diagnosis not present

## 2022-06-18 DIAGNOSIS — D649 Anemia, unspecified: Secondary | ICD-10-CM | POA: Diagnosis not present

## 2022-06-18 DIAGNOSIS — Z95828 Presence of other vascular implants and grafts: Secondary | ICD-10-CM

## 2022-06-18 DIAGNOSIS — Z5112 Encounter for antineoplastic immunotherapy: Secondary | ICD-10-CM | POA: Diagnosis not present

## 2022-06-18 DIAGNOSIS — C481 Malignant neoplasm of specified parts of peritoneum: Secondary | ICD-10-CM | POA: Diagnosis not present

## 2022-06-18 DIAGNOSIS — R519 Headache, unspecified: Secondary | ICD-10-CM | POA: Diagnosis not present

## 2022-06-18 DIAGNOSIS — E860 Dehydration: Secondary | ICD-10-CM | POA: Diagnosis not present

## 2022-06-18 LAB — CBC WITH DIFFERENTIAL (CANCER CENTER ONLY)
Abs Immature Granulocytes: 0.01 10*3/uL (ref 0.00–0.07)
Basophils Absolute: 0.1 10*3/uL (ref 0.0–0.1)
Basophils Relative: 1 %
Eosinophils Absolute: 0.2 10*3/uL (ref 0.0–0.5)
Eosinophils Relative: 5 %
HCT: 31.5 % — ABNORMAL LOW (ref 36.0–46.0)
Hemoglobin: 10.5 g/dL — ABNORMAL LOW (ref 12.0–15.0)
Immature Granulocytes: 0 %
Lymphocytes Relative: 23 %
Lymphs Abs: 1 10*3/uL (ref 0.7–4.0)
MCH: 35.1 pg — ABNORMAL HIGH (ref 26.0–34.0)
MCHC: 33.3 g/dL (ref 30.0–36.0)
MCV: 105.4 fL — ABNORMAL HIGH (ref 80.0–100.0)
Monocytes Absolute: 0.4 10*3/uL (ref 0.1–1.0)
Monocytes Relative: 9 %
Neutro Abs: 2.8 10*3/uL (ref 1.7–7.7)
Neutrophils Relative %: 62 %
Platelet Count: 197 10*3/uL (ref 150–400)
RBC: 2.99 MIL/uL — ABNORMAL LOW (ref 3.87–5.11)
RDW: 13.2 % (ref 11.5–15.5)
WBC Count: 4.5 10*3/uL (ref 4.0–10.5)
nRBC: 0 % (ref 0.0–0.2)

## 2022-06-18 LAB — CMP (CANCER CENTER ONLY)
ALT: 14 U/L (ref 0–44)
AST: 20 U/L (ref 15–41)
Albumin: 3.6 g/dL (ref 3.5–5.0)
Alkaline Phosphatase: 47 U/L (ref 38–126)
Anion gap: 9 (ref 5–15)
BUN: 16 mg/dL (ref 8–23)
CO2: 27 mmol/L (ref 22–32)
Calcium: 9.3 mg/dL (ref 8.9–10.3)
Chloride: 103 mmol/L (ref 98–111)
Creatinine: 0.77 mg/dL (ref 0.44–1.00)
GFR, Estimated: >60 mL/min (ref >60)
Glucose, Bld: 108 mg/dL — ABNORMAL HIGH (ref 70–99)
Potassium: 4 mmol/L (ref 3.5–5.1)
Sodium: 139 mmol/L (ref 135–145)
Total Bilirubin: 0.4 mg/dL (ref 0.3–1.2)
Total Protein: 6.2 g/dL — ABNORMAL LOW (ref 6.5–8.1)

## 2022-06-18 LAB — MAGNESIUM: Magnesium: 1.8 mg/dL (ref 1.7–2.4)

## 2022-06-18 MED ORDER — HEPARIN SOD (PORK) LOCK FLUSH 100 UNIT/ML IV SOLN
500.0000 [IU] | Freq: Once | INTRAVENOUS | Status: AC
  Administered 2022-06-18: 500 [IU] via INTRAVENOUS

## 2022-06-18 MED ORDER — OLAPARIB 150 MG PO TABS: ORAL_TABLET | ORAL | 0 refills | Status: DC

## 2022-06-18 MED ORDER — SODIUM CHLORIDE 0.9% FLUSH
10.0000 mL | Freq: Once | INTRAVENOUS | Status: AC
  Administered 2022-06-18: 10 mL via INTRAVENOUS

## 2022-06-18 NOTE — Progress Notes (Signed)
[facility] OFFICE PROGRESS NOTE   Diagnosis: Peritoneal cancer  INTERVAL HISTORY:   Nancy Harrison returns as scheduled.  She resumed Olaparib at a reduced dose 06/09/2022.  She completed a cycle of Avastin 06/09/2022.  She denies nausea/vomiting.  No mouth sores.  She is emptying the ostomy bag about 3 times a day.  Stool is not watery.  She takes Imodium and Lomotil.  Headaches better overall.    Objective:  Vital signs in last 24 hours:  Blood pressure (!) 160/78, pulse 98, temperature 98.1 F (36.7 C), temperature source Oral, resp. rate 18, height 4\' 11"  (1.499 m), weight 124 lb 9.6 oz (56.5 kg), SpO2 98 %.    HEENT: No thrush or ulcers. Resp: Lungs clear bilaterally. Cardio: Regular rate and rhythm. GI: Abdomen soft.  No hepatomegaly.  Tender at the left abdomen.  Right abdomen ileostomy, small amount of soft stool in the collection bag. Vascular: Trace edema lower leg/ankle bilaterally. Port-A-Cath without erythema.  Lab Results:  Lab Results  Component Value Date   WBC 4.5 06/18/2022   HGB 10.5 (L) 06/18/2022   HCT 31.5 (L) 06/18/2022   MCV 105.4 (H) 06/18/2022   PLT 197 06/18/2022   NEUTROABS 2.8 06/18/2022    Imaging:  No results found.  Medications: I have reviewed the patient's current medications.  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  Bone lesions concerning for metastases, right lung nodule Laparoscopy, diverting loop ileostomy, gastrostomy tube placement, peritoneal biopsy 10/16/2021, no evidence of a primary tumor site at the appendix, ovaries, or uterus.  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  CDX2, CK20, and GATA3 negative.  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 Foundation 1-MSS, tumor mutation burden 5, HRD positive-LOH score 34.1% 08/11/2021 CA125 818 CT chest 11/03/2021-10 mm right lower lobe nodule, scattered tiny pulmonary nodules, peritoneal carcinomatosis Cycle 1 Taxol/carboplatin 11/12/2021 Cycle 2 Taxol/carboplatin 12/03/2021 Cycle 3 Taxol/carboplatin 12/24/2021 Bone scan 01/05/2022-negative for metastatic disease Cycle 4 Taxol/carboplatin 01/13/2022 CTs 01/22/2022-reduction in peritoneal carcinomatosis, no evidence of progressive disease stable bilateral lower lobe pulmonary nodules Cycle 5 Taxol/carboplatin 02/03/2022 Cycle 6 Taxol/carboplatin 02/24/2022 CT abdomen/pelvis 04/19/2022-slight decrease in peritoneal carcinomatosis with 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   Abdominal distention/pain and diarrhea secondary to #1 3.   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  Lumpectomy and adjuvant CMF x8 followed by radiation 4. Hypertension 5. Hypothyroidism 6. Barrett's esophagus 7. Family history of multiple cancers including appendix, colon, and lung cancer 8.  Hospital admission 11/05/2021 with dehydration/prerenal azotemia secondary to nausea and high output ileostomy 9.  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 Admission 05/29/2022 with dehydration and acute renal injury  Disposition: Nancy Harrison appears stable.  She resumed olaparib at a reduced dose last week.  She continues Avastin  every 3 weeks, last infusion 06/09/2022.  She seems to be tolerating treatment well.  CBC and chemistry panel reviewed.  Stable anemia.  Magnesium is in low normal range.  She will continue oral magnesium.  She will return for lab, follow-up, Avastin as scheduled on 07/01/2022.  We are available to see her sooner if needed.    Lonna Cobb ANP/GNP-BC   06/18/2022  2:11 PM

## 2022-06-21 ENCOUNTER — Other Ambulatory Visit: Payer: Self-pay | Admitting: Oncology

## 2022-06-27 ENCOUNTER — Other Ambulatory Visit: Payer: Self-pay | Admitting: Oncology

## 2022-06-30 ENCOUNTER — Other Ambulatory Visit: Payer: Self-pay | Admitting: Oncology

## 2022-06-30 ENCOUNTER — Other Ambulatory Visit: Payer: Self-pay

## 2022-07-01 ENCOUNTER — Inpatient Hospital Stay: Payer: Medicare HMO

## 2022-07-01 ENCOUNTER — Inpatient Hospital Stay: Payer: Medicare HMO | Attending: Oncology

## 2022-07-01 ENCOUNTER — Encounter: Payer: Self-pay | Admitting: Nurse Practitioner

## 2022-07-01 ENCOUNTER — Inpatient Hospital Stay: Payer: Medicare HMO | Admitting: Nurse Practitioner

## 2022-07-01 VITALS — BP 148/74 | HR 81

## 2022-07-01 VITALS — BP 149/73 | HR 96 | Temp 98.2°F | Resp 18 | Ht 59.0 in | Wt 120.0 lb

## 2022-07-01 DIAGNOSIS — C481 Malignant neoplasm of specified parts of peritoneum: Secondary | ICD-10-CM | POA: Diagnosis not present

## 2022-07-01 DIAGNOSIS — C786 Secondary malignant neoplasm of retroperitoneum and peritoneum: Secondary | ICD-10-CM

## 2022-07-01 DIAGNOSIS — Z5112 Encounter for antineoplastic immunotherapy: Secondary | ICD-10-CM | POA: Insufficient documentation

## 2022-07-01 LAB — CBC WITH DIFFERENTIAL (CANCER CENTER ONLY)
Abs Immature Granulocytes: 0.01 10*3/uL (ref 0.00–0.07)
Basophils Absolute: 0 10*3/uL (ref 0.0–0.1)
Basophils Relative: 1 %
Eosinophils Absolute: 0.2 10*3/uL (ref 0.0–0.5)
Eosinophils Relative: 4 %
HCT: 33.5 % — ABNORMAL LOW (ref 36.0–46.0)
Hemoglobin: 11 g/dL — ABNORMAL LOW (ref 12.0–15.0)
Immature Granulocytes: 0 %
Lymphocytes Relative: 27 %
Lymphs Abs: 1.4 10*3/uL (ref 0.7–4.0)
MCH: 34.6 pg — ABNORMAL HIGH (ref 26.0–34.0)
MCHC: 32.8 g/dL (ref 30.0–36.0)
MCV: 105.3 fL — ABNORMAL HIGH (ref 80.0–100.0)
Monocytes Absolute: 0.6 10*3/uL (ref 0.1–1.0)
Monocytes Relative: 11 %
Neutro Abs: 2.9 10*3/uL (ref 1.7–7.7)
Neutrophils Relative %: 57 %
Platelet Count: 218 10*3/uL (ref 150–400)
RBC: 3.18 MIL/uL — ABNORMAL LOW (ref 3.87–5.11)
RDW: 14.1 % (ref 11.5–15.5)
WBC Count: 5.1 10*3/uL (ref 4.0–10.5)
nRBC: 0 % (ref 0.0–0.2)

## 2022-07-01 LAB — CMP (CANCER CENTER ONLY)
ALT: 11 U/L (ref 0–44)
AST: 19 U/L (ref 15–41)
Albumin: 4.1 g/dL (ref 3.5–5.0)
Alkaline Phosphatase: 40 U/L (ref 38–126)
Anion gap: 9 (ref 5–15)
BUN: 19 mg/dL (ref 8–23)
CO2: 27 mmol/L (ref 22–32)
Calcium: 10 mg/dL (ref 8.9–10.3)
Chloride: 105 mmol/L (ref 98–111)
Creatinine: 0.73 mg/dL (ref 0.44–1.00)
GFR, Estimated: >60 mL/min (ref >60)
Glucose, Bld: 109 mg/dL — ABNORMAL HIGH (ref 70–99)
Potassium: 4 mmol/L (ref 3.5–5.1)
Sodium: 141 mmol/L (ref 135–145)
Total Bilirubin: 0.6 mg/dL (ref 0.3–1.2)
Total Protein: 7.1 g/dL (ref 6.5–8.1)

## 2022-07-01 LAB — TOTAL PROTEIN, URINE DIPSTICK: Protein, ur: NEGATIVE mg/dL

## 2022-07-01 MED ORDER — SODIUM CHLORIDE 0.9% FLUSH
10.0000 mL | INTRAVENOUS | Status: DC | PRN
  Administered 2022-07-01: 10 mL

## 2022-07-01 MED ORDER — HEPARIN SOD (PORK) LOCK FLUSH 100 UNIT/ML IV SOLN
500.0000 [IU] | Freq: Once | INTRAVENOUS | Status: AC | PRN
  Administered 2022-07-01: 500 [IU]

## 2022-07-01 MED ORDER — SODIUM CHLORIDE 0.9 % IV SOLN
15.0000 mg/kg | Freq: Once | INTRAVENOUS | Status: AC
  Administered 2022-07-01: 800 mg via INTRAVENOUS
  Filled 2022-07-01: qty 32

## 2022-07-01 MED ORDER — SODIUM CHLORIDE 0.9 % IV SOLN: Freq: Once | INTRAVENOUS | Status: AC

## 2022-07-01 NOTE — Progress Notes (Signed)
[facility] OFFICE PROGRESS NOTE   Diagnosis: Peritoneal cancer  INTERVAL HISTORY:   Nancy Harrison returns as scheduled.  She continues olaparib.  Avastin is given on a 3-week schedule.  No significant diarrhea.  She continues Imodium.  She is titrating to bowel habits.  No nausea or vomiting.  No rash.  No cough or shortness of breath.  She denies bleeding.  No fever or chills.  Objective:  Vital signs in last 24 hours:  Blood pressure (!) 149/73, pulse 96, temperature 98.2 F (36.8 C), temperature source Oral, resp. rate 18, height 4\' 11"  (1.499 m), weight 120 lb (54.4 kg), SpO2 98 %.    HEENT: No thrush or ulcers. Resp: Lungs clear bilaterally. Cardio: Regular rate and rhythm. GI: No hepatosplenomegaly.  Right abdomen ileostomy. Vascular: Trace lower leg edema bilaterally. Skin: No rash. Port-A-Cath without erythema.  Lab Results:  Lab Results  Component Value Date   WBC 5.1 07/01/2022   HGB 11.0 (L) 07/01/2022   HCT 33.5 (L) 07/01/2022   MCV 105.3 (H) 07/01/2022   PLT 218 07/01/2022   NEUTROABS 2.9 07/01/2022    Imaging:  No results found.  Medications: I have reviewed the patient's current medications.  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  Bone lesions concerning for metastases, right lung nodule Laparoscopy, diverting loop ileostomy, gastrostomy tube placement, peritoneal biopsy 10/16/2021, no evidence of a primary tumor site at the appendix, ovaries, or uterus.  Diffuse peritoneal carcinomatosis Pathology of the lesser curvature of stomach carcinomatosis biopsy-metastatic poorly differentiated carcinoma, CK7, PAX8, WT1, and p53 positive with focal  labeling for p16.  CDX2, CK20, and GATA3 negative.  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 Foundation 1-MSS, tumor mutation burden 5, HRD positive-LOH score 34.1% 08/11/2021 CA125 818 CT chest 11/03/2021-10 mm right lower lobe nodule, scattered tiny pulmonary nodules, peritoneal carcinomatosis Cycle 1 Taxol/carboplatin 11/12/2021 Cycle 2 Taxol/carboplatin 12/03/2021 Cycle 3 Taxol/carboplatin 12/24/2021 Bone scan 01/05/2022-negative for metastatic disease Cycle 4 Taxol/carboplatin 01/13/2022 CTs 01/22/2022-reduction in peritoneal carcinomatosis, no evidence of progressive disease stable bilateral lower lobe pulmonary nodules Cycle 5 Taxol/carboplatin 02/03/2022 Cycle 6 Taxol/carboplatin 02/24/2022 CT abdomen/pelvis 04/19/2022-slight decrease in peritoneal carcinomatosis with 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   Abdominal distention/pain and diarrhea secondary to #1 3.   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  Lumpectomy and adjuvant CMF x8 followed by radiation 4. Hypertension 5. Hypothyroidism 6. Barrett's esophagus 7. Family history of multiple cancers including appendix, colon, and lung cancer 8.  Hospital admission 11/05/2021 with dehydration/prerenal azotemia secondary to nausea and high output ileostomy 9.  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 Admission 05/29/2022 with dehydration and acute renal injury  Disposition: [name] appears stable.  She is tolerating the reduced dose of olaparib well.  No recurrence of diarrhea since resuming.  Plan to continue at the current dose.  She receives bevacizumab every 3 weeks, infusion  today.  CBC and chemistry panel reviewed.  Labs adequate to proceed as above.  She will return for follow-up and treatment  in 3 weeks.  She will contact the office in the interim with any problems.    Lonna Cobb ANP/GNP-BC   07/01/2022  2:11 PM

## 2022-07-01 NOTE — Patient Instructions (Signed)
[facility]  Discharge Instructions: Thank you for choosing [facility] to provide your oncology and hematology care.   If you have a lab appointment with the Cancer Center, please go directly to the Cancer Center and check in at the registration area.   Wear comfortable clothing and clothing appropriate for easy access to any Portacath or PICC line.   We strive to give you quality time with your provider. You may need to reschedule your appointment if you arrive late (15 or more minutes).  Arriving late affects you and other patients whose appointments are after yours.  Also, if you miss three or more appointments without notifying the office, you may be dismissed from the clinic at the provider's discretion.      For prescription refill requests, have your pharmacy contact our office and allow 72 hours for refills to be completed.    Today you received the following chemotherapy and/or immunotherapy agents Avastin      To help prevent nausea and vomiting after your treatment, we encourage you to take your nausea medication as directed.  BELOW ARE SYMPTOMS THAT SHOULD BE REPORTED IMMEDIATELY: *FEVER GREATER THAN 100.4 F (38 C) OR HIGHER *CHILLS OR SWEATING *NAUSEA AND VOMITING THAT IS NOT CONTROLLED WITH YOUR NAUSEA MEDICATION *UNUSUAL SHORTNESS OF BREATH *UNUSUAL BRUISING OR BLEEDING *URINARY PROBLEMS (pain or burning when urinating, or frequent urination) *BOWEL PROBLEMS (unusual diarrhea, constipation, pain near the anus) TENDERNESS IN MOUTH AND THROAT WITH OR WITHOUT PRESENCE OF ULCERS (sore throat, sores in mouth, or a toothache) UNUSUAL RASH, SWELLING OR PAIN  UNUSUAL VAGINAL DISCHARGE OR ITCHING   Items with * indicate a potential emergency and should be followed up as soon as possible or go to the Emergency Department if any problems should occur.  Please show the CHEMOTHERAPY ALERT CARD or IMMUNOTHERAPY ALERT CARD at check-in  to the Emergency Department and triage nurse.  Should you have questions after your visit or need to cancel or reschedule your appointment, please contact [facility]  Dept: [phone]  and follow the prompts.  Office hours are 8:00 a.m. to 4:30 p.m. Monday - Friday. Please note that voicemails left after 4:00 p.m. may not be returned until the following business day.  We are closed weekends and major holidays. You have access to a nurse at all times for urgent questions. Please call the main number to the clinic Dept: [phone] and follow the prompts.   For any non-urgent questions, you may also contact your provider using MyChart. We now offer e-Visits for anyone 18 and older to request care online for non-urgent symptoms. For details visit [email or web address].   Also download the MyChart app! Go to the app store, search "MyChart", open the app, select [facility], and log in with your MyChart username and password.   

## 2022-07-01 NOTE — Progress Notes (Addendum)
Patient seen by Lonna Cobb NP today  Vitals are within treatment parameters.  Labs reviewed by Lonna Cobb NP and are within treatment parameters. The patient has not voided urine yet. Initiated treatment following the collection of a urine sample.  Per physician team, patient is ready for treatment and there are NO modifications to the treatment plan.

## 2022-07-01 NOTE — Patient Instructions (Signed)

## 2022-07-02 ENCOUNTER — Other Ambulatory Visit: Payer: Self-pay

## 2022-07-06 ENCOUNTER — Other Ambulatory Visit: Payer: Self-pay | Admitting: *Deleted

## 2022-07-06 ENCOUNTER — Encounter: Payer: Self-pay | Admitting: Nurse Practitioner

## 2022-07-06 MED ORDER — MIRTAZAPINE 7.5 MG PO TABS: 7.5000 mg | ORAL_TABLET | Freq: Every day | ORAL | 5 refills | Status: DC

## 2022-07-06 MED ORDER — THIAMINE HCL 100 MG PO TABS: 100.0000 mg | ORAL_TABLET | Freq: Every day | ORAL | 5 refills | Status: DC

## 2022-07-06 NOTE — Telephone Encounter (Signed)
Spent 45 minutes going back and forth w/[facility] and [facility] to determine status of script escribed on 06/18/22. Both placed reason for delay on the other. [facility] will re-submit script to [facility] and copy of script was also faxed to [facility] by this RN.

## 2022-07-07 ENCOUNTER — Other Ambulatory Visit: Payer: Self-pay

## 2022-07-09 ENCOUNTER — Other Ambulatory Visit: Payer: Self-pay | Admitting: Nurse Practitioner

## 2022-07-09 ENCOUNTER — Encounter: Payer: Self-pay | Admitting: Oncology

## 2022-07-12 ENCOUNTER — Telehealth: Payer: Self-pay

## 2022-07-12 MED ORDER — OLAPARIB 150 MG PO TABS: ORAL_TABLET | ORAL | 0 refills | Status: DC

## 2022-07-12 NOTE — Telephone Encounter (Signed)
The patient has not yet received her prescribed medication, [name]. I contacted [facility] and spoke with a nurse to address this issue. I explained that despite multiple attempts over the past three weeks, the patient has not yet received her medication. The nurse assured me that she would expedite the order. If the patient does not receive the medication by Thursday, we have been advised to follow up with [facility].

## 2022-07-15 ENCOUNTER — Ambulatory Visit: Payer: Medicare HMO | Admitting: Nurse Practitioner

## 2022-07-15 ENCOUNTER — Other Ambulatory Visit: Payer: Self-pay | Admitting: *Deleted

## 2022-07-15 ENCOUNTER — Other Ambulatory Visit: Payer: Medicare HMO

## 2022-07-15 ENCOUNTER — Ambulatory Visit: Payer: Medicare HMO

## 2022-07-15 MED ORDER — OLAPARIB 150 MG PO TABS: ORAL_TABLET | ORAL | 5 refills | Status: DC

## 2022-07-19 ENCOUNTER — Other Ambulatory Visit: Payer: Self-pay | Admitting: Oncology

## 2022-07-22 ENCOUNTER — Inpatient Hospital Stay: Payer: Medicare HMO

## 2022-07-22 ENCOUNTER — Ambulatory Visit: Payer: Medicare HMO

## 2022-07-22 ENCOUNTER — Other Ambulatory Visit: Payer: Medicare HMO

## 2022-07-22 ENCOUNTER — Ambulatory Visit: Payer: Medicare HMO | Admitting: Nurse Practitioner

## 2022-07-23 ENCOUNTER — Inpatient Hospital Stay: Payer: Medicare HMO

## 2022-07-23 ENCOUNTER — Encounter: Payer: Self-pay | Admitting: Nurse Practitioner

## 2022-07-23 ENCOUNTER — Inpatient Hospital Stay (HOSPITAL_BASED_OUTPATIENT_CLINIC_OR_DEPARTMENT_OTHER): Payer: Medicare HMO | Admitting: Nurse Practitioner

## 2022-07-23 VITALS — BP 150/77 | HR 86 | Resp 18

## 2022-07-23 VITALS — BP 155/82 | HR 92 | Temp 98.2°F | Resp 18 | Wt 120.2 lb

## 2022-07-23 DIAGNOSIS — C481 Malignant neoplasm of specified parts of peritoneum: Secondary | ICD-10-CM

## 2022-07-23 DIAGNOSIS — Z5112 Encounter for antineoplastic immunotherapy: Secondary | ICD-10-CM | POA: Diagnosis not present

## 2022-07-23 LAB — CMP (CANCER CENTER ONLY)
ALT: 13 U/L (ref 0–44)
AST: 20 U/L (ref 15–41)
Albumin: 4.2 g/dL (ref 3.5–5.0)
Alkaline Phosphatase: 42 U/L (ref 38–126)
Anion gap: 7 (ref 5–15)
BUN: 26 mg/dL — ABNORMAL HIGH (ref 8–23)
CO2: 29 mmol/L (ref 22–32)
Calcium: 9.8 mg/dL (ref 8.9–10.3)
Chloride: 105 mmol/L (ref 98–111)
Creatinine: 0.8 mg/dL (ref 0.44–1.00)
GFR, Estimated: >60 mL/min (ref >60)
Glucose, Bld: 114 mg/dL — ABNORMAL HIGH (ref 70–99)
Potassium: 4.5 mmol/L (ref 3.5–5.1)
Sodium: 141 mmol/L (ref 135–145)
Total Bilirubin: 0.6 mg/dL (ref 0.3–1.2)
Total Protein: 6.6 g/dL (ref 6.5–8.1)

## 2022-07-23 LAB — CBC WITH DIFFERENTIAL (CANCER CENTER ONLY)
Abs Immature Granulocytes: 0.02 10*3/uL (ref 0.00–0.07)
Basophils Absolute: 0.1 10*3/uL (ref 0.0–0.1)
Basophils Relative: 1 %
Eosinophils Absolute: 0.1 10*3/uL (ref 0.0–0.5)
Eosinophils Relative: 2 %
HCT: 34.7 % — ABNORMAL LOW (ref 36.0–46.0)
Hemoglobin: 11.4 g/dL — ABNORMAL LOW (ref 12.0–15.0)
Immature Granulocytes: 0 %
Lymphocytes Relative: 19 %
Lymphs Abs: 1.2 10*3/uL (ref 0.7–4.0)
MCH: 34.4 pg — ABNORMAL HIGH (ref 26.0–34.0)
MCHC: 32.9 g/dL (ref 30.0–36.0)
MCV: 104.8 fL — ABNORMAL HIGH (ref 80.0–100.0)
Monocytes Absolute: 0.6 10*3/uL (ref 0.1–1.0)
Monocytes Relative: 8 %
Neutro Abs: 4.7 10*3/uL (ref 1.7–7.7)
Neutrophils Relative %: 70 %
Platelet Count: 216 10*3/uL (ref 150–400)
RBC: 3.31 MIL/uL — ABNORMAL LOW (ref 3.87–5.11)
RDW: 13.9 % (ref 11.5–15.5)
WBC Count: 6.7 10*3/uL (ref 4.0–10.5)
nRBC: 0 % (ref 0.0–0.2)

## 2022-07-23 MED ORDER — HEPARIN SOD (PORK) LOCK FLUSH 100 UNIT/ML IV SOLN
500.0000 [IU] | Freq: Once | INTRAVENOUS | Status: AC | PRN
  Administered 2022-07-23: 500 [IU]

## 2022-07-23 MED ORDER — SODIUM CHLORIDE 0.9 % IV SOLN: INTRAVENOUS | Status: DC

## 2022-07-23 MED ORDER — SODIUM CHLORIDE 0.9 % IV SOLN
15.0000 mg/kg | Freq: Once | INTRAVENOUS | Status: AC
  Administered 2022-07-23: 800 mg via INTRAVENOUS
  Filled 2022-07-23: qty 32

## 2022-07-23 MED ORDER — SODIUM CHLORIDE 0.9% FLUSH
10.0000 mL | INTRAVENOUS | Status: DC | PRN
  Administered 2022-07-23: 10 mL

## 2022-07-23 MED ORDER — NYSTATIN 100000 UNIT/GM EX POWD
1.0000 | Freq: Three times a day (TID) | CUTANEOUS | 0 refills | Status: DC
Start: 2022-07-23 — End: 2022-12-14

## 2022-07-23 NOTE — Patient Instructions (Signed)
[facility]   Discharge Instructions: Thank you for choosing [facility] to provide your oncology and hematology care.   If you have a lab appointment with the Cancer Center, please go directly to the Cancer Center and check in at the registration area.   Wear comfortable clothing and clothing appropriate for easy access to any Portacath or PICC line.   We strive to give you quality time with your provider. You may need to reschedule your appointment if you arrive late (15 or more minutes).  Arriving late affects you and other patients whose appointments are after yours.  Also, if you miss three or more appointments without notifying the office, you may be dismissed from the clinic at the provider's discretion.      For prescription refill requests, have your pharmacy contact our office and allow 72 hours for refills to be completed.    Today you received the following chemotherapy and/or immunotherapy agents Bevacizumab-bvzr (ZIRABEV).      To help prevent nausea and vomiting after your treatment, we encourage you to take your nausea medication as directed.  BELOW ARE SYMPTOMS THAT SHOULD BE REPORTED IMMEDIATELY: *FEVER GREATER THAN 100.4 F (38 C) OR HIGHER *CHILLS OR SWEATING *NAUSEA AND VOMITING THAT IS NOT CONTROLLED WITH YOUR NAUSEA MEDICATION *UNUSUAL SHORTNESS OF BREATH *UNUSUAL BRUISING OR BLEEDING *URINARY PROBLEMS (pain or burning when urinating, or frequent urination) *BOWEL PROBLEMS (unusual diarrhea, constipation, pain near the anus) TENDERNESS IN MOUTH AND THROAT WITH OR WITHOUT PRESENCE OF ULCERS (sore throat, sores in mouth, or a toothache) UNUSUAL RASH, SWELLING OR PAIN  UNUSUAL VAGINAL DISCHARGE OR ITCHING   Items with * indicate a potential emergency and should be followed up as soon as possible or go to the Emergency Department if any problems should occur.  Please show the CHEMOTHERAPY ALERT CARD or IMMUNOTHERAPY  ALERT CARD at check-in to the Emergency Department and triage nurse.  Should you have questions after your visit or need to cancel or reschedule your appointment, please contact [facility]  Dept: [phone]  and follow the prompts.  Office hours are 8:00 a.m. to 4:30 p.m. Monday - Friday. Please note that voicemails left after 4:00 p.m. may not be returned until the following business day.  We are closed weekends and major holidays. You have access to a nurse at all times for urgent questions. Please call the main number to the clinic Dept: [phone] and follow the prompts.   For any non-urgent questions, you may also contact your provider using MyChart. We now offer e-Visits for anyone 23 and older to request care online for non-urgent symptoms. For details visit [email or web address].   Also download the MyChart app! Go to the app store, search "MyChart", open the app, select , and log in with your MyChart username and password.  Bevacizumab Injection What is this medication? BEVACIZUMAB (be va SIZ yoo mab) treats some types of cancer. It works by blocking a protein that causes cancer cells to grow and multiply. This helps to slow or stop the spread of cancer cells. It is a monoclonal antibody. This medicine may be used for other purposes; ask your health care provider or pharmacist if you have questions. COMMON BRAND NAME(S): Alymsys, Avastin, MVASI, Omer Jack What should I tell my care team before I take this medication? They need to know if you have any of these conditions: Blood clots Coughing up blood Having or recent surgery Heart  failure High blood pressure History of a connection between 2 or more body parts that do not usually connect (fistula) History of a tear in your stomach or intestines Protein in your urine An unusual or allergic reaction to bevacizumab, other medications, foods, dyes, or preservatives Pregnant or trying to  get pregnant Breast-feeding How should I use this medication? This medication is injected into a vein. It is given by your care team in a hospital or clinic setting. Talk to your care team the use of this medication in children. Special care may be needed. Overdosage: If you think you have taken too much of this medicine contact a poison control center or emergency room at once. NOTE: This medicine is only for you. Do not share this medicine with others. What if I miss a dose? Keep appointments for follow-up doses. It is important not to miss your dose. Call your care team if you are unable to keep an appointment. What may interact with this medication? Interactions are not expected. This list may not describe all possible interactions. Give your health care provider a list of all the medicines, herbs, non-prescription drugs, or dietary supplements you use. Also tell them if you smoke, drink alcohol, or use illegal drugs. Some items may interact with your medicine. What should I watch for while using this medication? Your condition will be monitored carefully while you are receiving this medication. You may need blood work while taking this medication. This medication may make you feel generally unwell. This is not uncommon as chemotherapy can affect healthy cells as well as cancer cells. Report any side effects. Continue your course of treatment even though you feel ill unless your care team tells you to stop. This medication may increase your risk to bruise or bleed. Call your care team if you notice any unusual bleeding. Before having surgery, talk to your care team to make sure it is ok. This medication can increase the risk of poor healing of your surgical site or wound. You will need to stop this medication for 28 days before surgery. After surgery, wait at least 28 days before restarting this medication. Make sure the surgical site or wound is healed enough before restarting this medication.  Talk to your care team if questions. Talk to your care team if you may be pregnant. Serious birth defects can occur if you take this medication during pregnancy and for 6 months after the last dose. Contraception is recommended while taking this medication and for 6 months after the last dose. Your care team can help you find the option that works for you. Do not breastfeed while taking this medication and for 6 months after the last dose. This medication can cause infertility. Talk to your care team if you are concerned about your fertility. What side effects may I notice from receiving this medication? Side effects that you should report to your care team as soon as possible: Allergic reactions--skin rash, itching, hives, swelling of the face, lips, tongue, or throat Bleeding--bloody or black, tar-like stools, vomiting blood or brown material that looks like coffee grounds, red or dark brown urine, small red or purple spots on skin, unusual bruising or bleeding Blood clot--pain, swelling, or warmth in the leg, shortness of breath, chest pain Heart attack--pain or tightness in the chest, shoulders, arms, or jaw, nausea, shortness of breath, cold or clammy skin, feeling faint or lightheaded Heart failure--shortness of breath, swelling of the ankles, feet, or hands, sudden weight gain, unusual weakness or  fatigue Increase in blood pressure Infection--fever, chills, cough, sore throat, wounds that don't heal, pain or trouble when passing urine, general feeling of discomfort or being unwell Infusion reactions--chest pain, shortness of breath or trouble breathing, feeling faint or lightheaded Kidney injury--decrease in the amount of urine, swelling of the ankles, hands, or feet Stomach pain that is severe, does not go away, or gets worse Stroke--sudden numbness or weakness of the face, arm, or leg, trouble speaking, confusion, trouble walking, loss of balance or coordination, dizziness, severe headache,  change in vision Sudden and severe headache, confusion, change in vision, seizures, which may be signs of posterior reversible encephalopathy syndrome (PRES) Side effects that usually do not require medical attention (report to your care team if they continue or are bothersome): Back pain Change in taste Diarrhea Dry skin Increased tears Nosebleed This list may not describe all possible side effects. Call your doctor for medical advice about side effects. You may report side effects to FDA at 1-800-FDA-1088. Where should I keep my medication? This medication is given in a hospital or clinic. It will not be stored at home. NOTE: This sheet is a summary. It may not cover all possible information. If you have questions about this medicine, talk to your doctor, pharmacist, or health care provider.  2024 Elsevier/Gold Standard (2021-06-26 00:00:00)

## 2022-07-23 NOTE — Progress Notes (Signed)
Patient seen by Lisa Thomas NP today  Vitals are within treatment parameters.  Labs reviewed by Lisa Thomas NP and are within treatment parameters.  Per physician team, patient is ready for treatment and there are NO modifications to the treatment plan.     

## 2022-07-23 NOTE — Progress Notes (Signed)
[facility] OFFICE PROGRESS NOTE   Diagnosis: Peritoneal cancer  INTERVAL HISTORY:   Nancy Harrison returns as scheduled.  She continues olaparib.  She receives Avastin every 3 weeks.  She denies bleeding.  No significant diarrhea.  She is decreasing use of Imodium and Lomotil as tolerated.  No nausea or vomiting.  No mouth sores.  No abdominal pain.  Appetite is described as "fair".  She has a rash at the left groin.  Objective:  Vital signs in last 24 hours:  Blood pressure (!) 155/82, pulse 92, temperature 98.2 F (36.8 C), temperature source Oral, resp. rate 18, weight 120 lb 3.2 oz (54.5 kg), SpO2 99 %.    HEENT: No thrush or ulcers. Resp: Lungs clear bilaterally. Cardio: Regular rate and rhythm. GI: No hepatosplenomegaly.  No mass.  Right abdomen ileostomy. Vascular: Trace bilateral ankle edema. Skin: Well-demarcated erythematous rash left groin. Port-A-Cath without erythema.  Lab Results:  Lab Results  Component Value Date   WBC 6.7 07/23/2022   HGB 11.4 (L) 07/23/2022   HCT 34.7 (L) 07/23/2022   MCV 104.8 (H) 07/23/2022   PLT 216 07/23/2022   NEUTROABS 4.7 07/23/2022    Imaging:  No results found.  Medications: I have reviewed the patient's current medications.  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  Bone lesions concerning for metastases, right lung nodule Laparoscopy, diverting loop ileostomy, gastrostomy tube placement, peritoneal biopsy 10/16/2021, no evidence of a primary tumor site at the appendix, ovaries, or uterus.  Diffuse peritoneal carcinomatosis Pathology of the lesser curvature of stomach carcinomatosis biopsy-metastatic poorly  differentiated carcinoma, CK7, PAX8, WT1, and p53 positive with focal labeling for p16.  CDX2, CK20, and GATA3 negative.  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 Foundation 1-MSS, tumor mutation burden 5, HRD positive-LOH score 34.1% 08/11/2021 CA125 818 CT chest 11/03/2021-10 mm right lower lobe nodule, scattered tiny pulmonary nodules, peritoneal carcinomatosis Cycle 1 Taxol/carboplatin 11/12/2021 Cycle 2 Taxol/carboplatin 12/03/2021 Cycle 3 Taxol/carboplatin 12/24/2021 Bone scan 01/05/2022-negative for metastatic disease Cycle 4 Taxol/carboplatin 01/13/2022 CTs 01/22/2022-reduction in peritoneal carcinomatosis, no evidence of progressive disease stable bilateral lower lobe pulmonary nodules Cycle 5 Taxol/carboplatin 02/03/2022 Cycle 6 Taxol/carboplatin 02/24/2022 CT abdomen/pelvis 04/19/2022-slight decrease in peritoneal carcinomatosis with 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   Abdominal distention/pain and diarrhea secondary to #1 3.   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  Lumpectomy and adjuvant CMF x8 followed by radiation 4. Hypertension 5. Hypothyroidism 6. Barrett's esophagus 7. Family history of multiple cancers including appendix, colon, and lung cancer 8.  Hospital admission 11/05/2021 with dehydration/prerenal azotemia secondary to nausea and high output ileostomy 9.  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 Admission 05/29/2022 with dehydration and acute renal injury  Disposition: Nancy Harrison appears stable.  She continues olaparib and Avastin.  She is tolerating treatment well.  We will follow-up on the CA125 from  today.  CBC reviewed.  Counts adequate to proceed with treatment today as scheduled.  She likely has a yeast rash at the left groin.  Prescription for nystatin powder sent to her pharmacy.  She will return for follow-up in 3 weeks.  We are available to see her sooner if needed.    Lonna Cobb ANP/GNP-BC   07/23/2022  2:00 PM

## 2022-07-26 DIAGNOSIS — Z932 Ileostomy status: Secondary | ICD-10-CM | POA: Diagnosis not present

## 2022-07-27 LAB — CA 125: Cancer Antigen (CA) 125: 14.7 U/mL (ref 0.0–38.1)

## 2022-07-29 ENCOUNTER — Encounter: Payer: Self-pay | Admitting: Nurse Practitioner

## 2022-07-29 ENCOUNTER — Other Ambulatory Visit: Payer: Self-pay

## 2022-07-29 DIAGNOSIS — C481 Malignant neoplasm of specified parts of peritoneum: Secondary | ICD-10-CM

## 2022-08-03 ENCOUNTER — Encounter: Payer: Self-pay | Admitting: Oncology

## 2022-08-05 ENCOUNTER — Other Ambulatory Visit: Payer: Self-pay

## 2022-08-08 ENCOUNTER — Other Ambulatory Visit: Payer: Self-pay | Admitting: Oncology

## 2022-08-11 ENCOUNTER — Ambulatory Visit (HOSPITAL_BASED_OUTPATIENT_CLINIC_OR_DEPARTMENT_OTHER): Payer: Medicare HMO | Attending: Oncology | Admitting: Physical Therapy

## 2022-08-11 ENCOUNTER — Other Ambulatory Visit: Payer: Self-pay

## 2022-08-11 ENCOUNTER — Encounter (HOSPITAL_BASED_OUTPATIENT_CLINIC_OR_DEPARTMENT_OTHER): Payer: Self-pay | Admitting: Physical Therapy

## 2022-08-11 DIAGNOSIS — M6281 Muscle weakness (generalized): Secondary | ICD-10-CM | POA: Insufficient documentation

## 2022-08-11 DIAGNOSIS — C481 Malignant neoplasm of specified parts of peritoneum: Secondary | ICD-10-CM | POA: Diagnosis not present

## 2022-08-11 DIAGNOSIS — R262 Difficulty in walking, not elsewhere classified: Secondary | ICD-10-CM | POA: Insufficient documentation

## 2022-08-11 NOTE — Therapy (Signed)
OUTPATIENT PHYSICAL THERAPY LOWER EXTREMITY EVALUATION   Patient Name: Nancy Harrison MRN: [id number] DOB:Feb 10, 1941, 82 y.o., female Today's Date: 08/11/2022  END OF SESSION:  PT End of Session - 08/11/22 1311     Visit Number 1    Number of Visits 18    Date for PT Re-Evaluation 11/09/22    Authorization Type [employer] MCR    PT Start Time 1015    PT Stop Time 1100    PT Time Calculation (min) 45 min    Activity Tolerance Patient tolerated treatment well    Behavior During Therapy 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   Cataract    Family history of colon cancer    Gastrostomy in place [facility])    LUQ - placed 10/16/2021   Hypertension    Ileostomy, has currently (HCC)    placed 09/2021   SUI (stress urinary incontinence, female)    Thyroid disease    Urinary urgency    Past Surgical History:  Procedure Laterality Date   BREAST LUMPECTOMY  1999   Chemo and radiation on right breast   IR IMAGING GUIDED PORT INSERTION  11/11/2021   LAPAROSCOPY N/A 10/16/2021   Procedure: LAPAROSCOPIC  DIVERTING OSTOMY, BIOSPY,  G-TUBE PLACEMENT;  Surgeon: Karie Soda, MD;  Location: [facility];  Service: General;  Laterality: N/A;   TONSILLECTOMY  1957   TOTAL KNEE ARTHROPLASTY  1999 & 2001   Patient Active Problem List   Diagnosis Date Noted   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   Acute renal failure (HCC) 05/30/2022   Non-recurrent unilateral inguinal hernia without obstruction or gangrene 04/13/2022   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   Non-recurrent bilateral inguinal hernia without obstruction or gangrene 04/06/2022   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   Genetic testing 12/03/2021   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   Nausea & vomiting  11/06/2021   Hyponatremia 11/06/2021   Family history of colon cancer 11/05/2021   ARF (acute renal failure) (HCC) 11/05/2021   Irritant contact dermatitis associated with digestive stoma    Gastrostomy tube dysfunction (HCC)    Irritant contact dermatitis associated with fecal stoma    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 10/17/2021   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) 10/17/2021   Ileostomy care (HCC) 10/17/2021   Colonic mass 10/15/2021   History of breast cancer 12/30/2011   Leukopenia 04/09/2011   Anxiety state 04/06/2007   Esophageal reflux 04/06/2007   Essential hypertension 04/06/2007   Hyperlipidemia 04/06/2007   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 Pt states the muscle are very fatigued- even when walking into clinic from parking lot. She has trouble with ADL due to strength deficits in legs. Pt has R arm and low back pain now due to less use of  legs and arm overcompensating. Pt states she has to use walls to walk at home but uses a cane to walk outside due to lack of balance. She cannot carry items due to balance and strength deficits. Pt can walk about 10 mins before feeling like she needs to sit down. Pt is able to bathe, dress, and cook for self. Pt is currently still driving. Pt denies pelvic pain. Uses her walker inside of her bathroom due to grab bars. Pt has bilateral lower leg swelling,  Pt denies legs giving way, NT, dizziness/fainting.    PERTINENT HISTORY: Barrett's esophagus, breast ca, HTN, thyroid  disease, stage IV extraovarian primary peritoneal carcinoma, laparascopic loop ileostomy 09/2021  Has had PT in the past for balance/falls   PAIN:  Are you having pain? No: NPRS scale: 010 Pain location: low back and R arm Pain description: dull achey, back is sharp Aggravating factors: lifting carrying Relieving factors: N/A  PRECAUTIONS: None  WEIGHT BEARING RESTRICTIONS: No  FALLS:  Has patient fallen in last 6 months? No  LIVING ENVIRONMENT: Lives with: lives with their spouse Lives in: House/apartment, has basement  (usually goes down there 1x/month)  Stairs: yes , 6 steps to enter with step to pattern  Has following equipment at home: Single point cane, walker, shower grab bars   OCCUPATION: N/A Leisure: reading, computer games   PLOF: Independent with basic ADLs  PATIENT GOALS: getting her strength back,    OBJECTIVE:   DIAGNOSTIC FINDINGS: no relevant MSK imaging  PATIENT SURVEYS:  FOTO 41 52 @ DC 8 pts MCII  COGNITION: Overall cognitive status: Within functional limits for tasks assessed     SENSATION: WFL   POSTURE: rounded shoulders, forward head, and increased thoracic kyphosis   LOWER EXTREMITY ROM: WFL for tasks assessed; ileostomy limits hip flexion and ext ROM, stiffness noted with ER, IR, and knee ext due to HS length in seated  LOWER EXTREMITY MMT:  MMT Right eval Left eval  Hip flexion 4-/5 4/5  Hip extension    Hip abduction 4/5 4/5  Hip adduction 4/5 4/5  Hip internal rotation    Hip external rotation    Knee flexion 4-/5 4-/5  Knee extension 4/5 4/5  Ankle dorsiflexion    Ankle plantarflexion 4/5 4/5  Ankle inversion    Ankle eversion     (Blank rows = not tested)  FUNCTIONAL TESTS:  5 times sit to stand: 15.2s  Timed up and go (TUG): 14.3s  4 stage balance: passed NBOS, semi tandem, and tandem with R in rear, unable to perform SLS >2s  GAIT: Distance walked: 24ft Assistive device utilized: Single point cane Level  of assistance: Complete Independence Comments: shortened step length, shuffling gait, low foot clearance   TODAY'S TREATMENT:                                                                                                                              DATE:  6/19   Exercises - Sit to Stand  - 3 x daily - 7 x weekly - 1 sets - 5 reps - Side Stepping with Resistance at Thighs and Counter Support  - 2 x daily - 7 x weekly - 1 sets - 4 reps - Standing March with Counter Support  - 2 x daily - 7 x weekly - 1 sets - 20 reps   PATIENT EDUCATION:  Education details: MOI, diagnosis, prognosis, anatomy, exercise progression, DOMS expectations, envelope of function, HEP, POC  Person educated: Patient Education method: Explanation, Demonstration, Tactile cues, Verbal cues, and Handouts Education comprehension: verbalized understanding, returned demonstration, verbal cues required, and tactile cues required  HOME EXERCISE PROGRAM: Access Code: [email or web address] URL: [email or web address] Date: 08/11/2022 Prepared by: Zebedee Iba   ASSESSMENT:  CLINICAL IMPRESSION: Patient is a 82 y.o. female who was seen today for physical therapy evaluation and treatment for c/c of general weakness and deconditioning following hospitalization for acute renal disease. Pt's pain is minimally sensitive and irritable with movement. Pt limited with transfers and gait due to weakness and muscle endurance deficits. Pt also presents as a falls risk as demonstrated by gait speed and LE power deficits. Plan to continue with resistance exercise based therapy at future sessions in order to improve mobility, balance, endurance, and strength. Pt would benefit from continued skilled therapy in order to reach goals and maximize functional  strength and endurance for prevention of further functional decline and falls.    OBJECTIVE IMPAIRMENTS: Abnormal gait, decreased activity tolerance, decreased balance, decreased endurance,  decreased knowledge of use of DME, decreased mobility, difficulty walking, decreased ROM, decreased strength, hypomobility, impaired flexibility, improper body mechanics, postural dysfunction, and pain.    ACTIVITY LIMITATIONS: carrying, lifting, bending, standing, squatting, stairs, transfers, bed mobility, and locomotion level   PARTICIPATION LIMITATIONS: meal prep, cleaning, laundry, driving, shopping, and community activity and exercise   PERSONAL FACTORS: Age, Fitness, Time since onset of injury/illness/exacerbation, and 1-2 comorbidities:    are also affecting patient's functional outcome.    REHAB POTENTIAL: Fair     CLINICAL DECISION MAKING: Evolving/moderate complexity   EVALUATION COMPLEXITY: Moderate     GOALS:     SHORT TERM GOALS: Target date: 09/22/2022    Pt will become independent with HEP in order to demonstrate synthesis of PT education.    Goal status: INITIAL   2.  Pt will be able to perform 5XSTS in under 12s  in order to demonstrate functional improvement above the cut off score for adults.   Goal status: INITIAL   3.  Pt will be able to demonstrate ability to perform gait with upright posture and AD  in order to demonstrate functional improvement in LE function for safety with community ambulation    Goal status: INITIAL       LONG TERM GOALS: Target date: 11/03/2022      Pt will be able to demonstrate TUG in under 10 sec in order to demonstrate functional improvement in LE function, strength, balance, and mobility for safety with community ambulation.   Goal status: INITIAL   Pt will decrease 5XSTS by at least 3 seconds in order to demonstrate clinically significant improvement in LE strength    Goal status: INITIAL   3.  Pt will be able to demonstrate/report ability to walk >10 mins in order to demonstrate functional improvement and tolerance to exercise and community mobility.    Goal status: INITIAL   4.  Pt will score >/= 57 on FOTO to  demonstrate improvement in perceived mobility and function.    Goal status: INITIAL        PLAN:   PT FREQUENCY: 1-2x/week   PT DURATION: 12 weeks   PLANNED INTERVENTIONS: Therapeutic exercises, Therapeutic activity, Neuromuscular re-education, Balance training, Gait training, Patient/Family education, Self Care, Joint mobilization, Joint manipulation, Stair training, Prosthetic training, DME instructions, Aquatic Therapy, Dry Needling, Electrical stimulation, Spinal manipulation, Spinal mobilization, Moist heat, scar mobilization, Splintting, Taping, Vasopneumatic device, Traction, Ultrasound, Ionotophoresis 4mg /ml Dexamethasone, Manual therapy, and Re-evaluation   PLAN FOR NEXT SESSION: LE exercise for transfers/transfer mechanics, consider resistance circuits, standing balance exercise  Zebedee Iba, PT 08/11/2022, 2:27 PM

## 2022-08-12 ENCOUNTER — Other Ambulatory Visit: Payer: Self-pay

## 2022-08-13 ENCOUNTER — Inpatient Hospital Stay: Payer: Medicare HMO | Attending: Oncology

## 2022-08-13 ENCOUNTER — Inpatient Hospital Stay: Payer: Medicare HMO

## 2022-08-13 ENCOUNTER — Ambulatory Visit (HOSPITAL_BASED_OUTPATIENT_CLINIC_OR_DEPARTMENT_OTHER): Payer: Medicare HMO | Admitting: Physical Therapy

## 2022-08-13 ENCOUNTER — Inpatient Hospital Stay: Payer: Medicare HMO | Admitting: Oncology

## 2022-08-13 ENCOUNTER — Encounter: Payer: Self-pay | Admitting: Oncology

## 2022-08-13 ENCOUNTER — Encounter (HOSPITAL_BASED_OUTPATIENT_CLINIC_OR_DEPARTMENT_OTHER): Payer: Self-pay | Admitting: Physical Therapy

## 2022-08-13 ENCOUNTER — Encounter: Payer: Self-pay | Admitting: *Deleted

## 2022-08-13 VITALS — BP 138/81 | HR 98 | Temp 98.1°F | Resp 18 | Ht 59.0 in | Wt 117.5 lb

## 2022-08-13 VITALS — BP 149/77 | HR 80 | Temp 98.2°F

## 2022-08-13 DIAGNOSIS — Z5112 Encounter for antineoplastic immunotherapy: Secondary | ICD-10-CM | POA: Insufficient documentation

## 2022-08-13 DIAGNOSIS — M6281 Muscle weakness (generalized): Secondary | ICD-10-CM

## 2022-08-13 DIAGNOSIS — R262 Difficulty in walking, not elsewhere classified: Secondary | ICD-10-CM | POA: Diagnosis not present

## 2022-08-13 DIAGNOSIS — C9000 Multiple myeloma not having achieved remission: Secondary | ICD-10-CM

## 2022-08-13 DIAGNOSIS — C481 Malignant neoplasm of specified parts of peritoneum: Secondary | ICD-10-CM

## 2022-08-13 LAB — BASIC METABOLIC PANEL - CANCER CENTER ONLY
Anion gap: 6 (ref 5–15)
BUN: 21 mg/dL (ref 8–23)
CO2: 29 mmol/L (ref 22–32)
Calcium: 9.6 mg/dL (ref 8.9–10.3)
Chloride: 105 mmol/L (ref 98–111)
Creatinine: 0.76 mg/dL (ref 0.44–1.00)
GFR, Estimated: >60 mL/min (ref >60)
Glucose, Bld: 146 mg/dL — ABNORMAL HIGH (ref 70–99)
Potassium: 4 mmol/L (ref 3.5–5.1)
Sodium: 140 mmol/L (ref 135–145)

## 2022-08-13 LAB — CBC WITH DIFFERENTIAL (CANCER CENTER ONLY)
Abs Immature Granulocytes: 0.01 10*3/uL (ref 0.00–0.07)
Basophils Absolute: 0 10*3/uL (ref 0.0–0.1)
Basophils Relative: 1 %
Eosinophils Absolute: 0.2 10*3/uL (ref 0.0–0.5)
Eosinophils Relative: 5 %
HCT: 35.1 % — ABNORMAL LOW (ref 36.0–46.0)
Hemoglobin: 11.4 g/dL — ABNORMAL LOW (ref 12.0–15.0)
Immature Granulocytes: 0 %
Lymphocytes Relative: 27 %
Lymphs Abs: 1.1 10*3/uL (ref 0.7–4.0)
MCH: 34.5 pg — ABNORMAL HIGH (ref 26.0–34.0)
MCHC: 32.5 g/dL (ref 30.0–36.0)
MCV: 106.4 fL — ABNORMAL HIGH (ref 80.0–100.0)
Monocytes Absolute: 0.4 10*3/uL (ref 0.1–1.0)
Monocytes Relative: 10 %
Neutro Abs: 2.3 10*3/uL (ref 1.7–7.7)
Neutrophils Relative %: 57 %
Platelet Count: 210 10*3/uL (ref 150–400)
RBC: 3.3 MIL/uL — ABNORMAL LOW (ref 3.87–5.11)
RDW: 13.7 % (ref 11.5–15.5)
WBC Count: 4.1 10*3/uL (ref 4.0–10.5)
nRBC: 0 % (ref 0.0–0.2)

## 2022-08-13 LAB — TOTAL PROTEIN, URINE DIPSTICK: Protein, ur: NEGATIVE mg/dL

## 2022-08-13 MED ORDER — SODIUM CHLORIDE 0.9 % IV SOLN
15.0000 mg/kg | Freq: Once | INTRAVENOUS | Status: AC
  Administered 2022-08-13: 800 mg via INTRAVENOUS
  Filled 2022-08-13: qty 32

## 2022-08-13 MED ORDER — HEPARIN SOD (PORK) LOCK FLUSH 100 UNIT/ML IV SOLN: 500.0000 [IU] | Freq: Once | INTRAVENOUS | Status: DC | PRN

## 2022-08-13 MED ORDER — SODIUM CHLORIDE 0.9 % IV SOLN: INTRAVENOUS | Status: DC

## 2022-08-13 MED ORDER — SODIUM CHLORIDE 0.9% FLUSH: 10.0000 mL | INTRAVENOUS | Status: DC | PRN

## 2022-08-13 NOTE — Progress Notes (Signed)
[facility] OFFICE PROGRESS NOTE   Diagnosis: Peritoneal carcinoma  INTERVAL HISTORY:   Mr. State returns as scheduled.  She continues olaparib.  She was last treated with bevacizumab on 07/23/2022.  No new complaint.  The stool is semiformed.  She takes Imodium and Lomotil as needed.  She empties the ostomy bag approximately 3 times daily.  Objective:  Vital signs in last 24 hours:  Blood pressure 138/81, pulse 98, temperature 98.1 F (36.7 C), temperature source Oral, resp. rate 18, height 4\' 11"  (1.499 m), weight 117 lb 8 oz (53.3 kg), SpO2 98 %.    HEENT: No thrush or ulcers Resp: Lungs clear bilaterally Cardio: Regular rate and rhythm GI: No mass, nontender, right abdomen ileostomy, no apparent ascites Vascular: No leg edema   Portacath/PICC-without erythema  Lab Results:  Lab Results  Component Value Date   WBC 4.1 08/13/2022   HGB 11.4 (L) 08/13/2022   HCT 35.1 (L) 08/13/2022   MCV 106.4 (H) 08/13/2022   PLT 210 08/13/2022   NEUTROABS 2.3 08/13/2022    CMP  Lab Results  Component Value Date   NA 141 07/23/2022   K 4.5 07/23/2022   CL 105 07/23/2022   CO2 29 07/23/2022   GLUCOSE 114 (H) 07/23/2022   BUN 26 (H) 07/23/2022   CREATININE 0.80 07/23/2022   CALCIUM 9.8 07/23/2022   PROT 6.6 07/23/2022   ALBUMIN 4.2 07/23/2022   AST 20 07/23/2022   ALT 13 07/23/2022   ALKPHOS 42 07/23/2022   BILITOT 0.6 07/23/2022   GFRNONAA >60 07/23/2022    Lab Results  Component Value Date   CEA1 2.5 10/18/2021    Lab Results  Component Value Date   INR 1.0 05/29/2022   LABPROT 13.0 05/29/2022    Imaging:  No results found.  Medications: I have reviewed the patient's current medications.   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  Bone lesions concerning for metastases, right lung nodule Laparoscopy, diverting loop ileostomy, gastrostomy tube placement, peritoneal biopsy 10/16/2021, no evidence of a primary tumor site at the appendix, ovaries, or uterus.  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  CDX2, CK20, and GATA3 negative.  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 Foundation 1-MSS, tumor mutation burden 5, HRD positive-LOH score 34.1% 08/11/2021 CA125 818 CT chest 11/03/2021-10 mm right lower lobe nodule, scattered tiny pulmonary nodules, peritoneal carcinomatosis Cycle 1 Taxol/carboplatin 11/12/2021 Cycle 2 Taxol/carboplatin 12/03/2021 Cycle 3 Taxol/carboplatin 12/24/2021 Bone scan 01/05/2022-negative for metastatic disease Cycle 4 Taxol/carboplatin 01/13/2022 CTs 01/22/2022-reduction in peritoneal carcinomatosis, no evidence of progressive disease stable bilateral lower lobe pulmonary nodules Cycle 5 Taxol/carboplatin 02/03/2022 Cycle 6 Taxol/carboplatin 02/24/2022 CT abdomen/pelvis 04/19/2022-slight decrease in peritoneal carcinomatosis with 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   Abdominal distention/pain and diarrhea secondary to #1 3.   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  Lumpectomy and adjuvant CMF x8 followed by radiation 4. Hypertension 5. Hypothyroidism 6. Barrett's esophagus  7. Family history of multiple cancers including appendix, colon,  and lung cancer 8.  Hospital admission 11/05/2021 with dehydration/prerenal azotemia secondary to nausea and high output ileostomy 9.  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 Admission 05/29/2022 with dehydration and acute renal injury    Disposition: Ms. Allaire appears stable.  She is tolerating the bevacizumab and Olaparib well.  She will complete another treatment with bevacizumab today.  She will undergo a restaging CT Abdo/pelvis prior to an office visit in 3 weeks.  Thornton Papas, MD  08/13/2022  10:53 AM

## 2022-08-13 NOTE — Therapy (Signed)
OUTPATIENT PHYSICAL THERAPY LOWER EXTREMITY TREATMENT   Patient Name: Trenita Hulme MRN: [id number] DOB:1940-11-12, 82 y.o., female Today's Date: 08/13/2022  END OF SESSION:  PT End of Session - 08/13/22 1501     Visit Number 2    Number of Visits 18    Date for PT Re-Evaluation 11/09/22    Authorization Type [employer] MCR    Progress Note Due on Visit 10    PT Start Time 1352    PT Stop Time 1431    PT Time Calculation (min) 39 min    Activity Tolerance Patient tolerated treatment well    Behavior During Therapy [facility] for tasks assessed/performed              Past Medical History:  Diagnosis Date   Arthritis    Barrett's esophagus    Path 10/2001   Breast cancer (HCC) 01/01/2011   Cancer (HCC) 1999   rt breast   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   Cataract    Family history of colon cancer    Gastrostomy in place [facility])    LUQ - placed 10/16/2021   Hypertension    Ileostomy, has currently (HCC)    placed 09/2021   SUI (stress urinary incontinence, female)    Thyroid disease    Urinary urgency    Past Surgical History:  Procedure Laterality Date   BREAST LUMPECTOMY  1999   Chemo and radiation on right breast   IR IMAGING GUIDED PORT INSERTION  11/11/2021   LAPAROSCOPY N/A 10/16/2021   Procedure: LAPAROSCOPIC  DIVERTING OSTOMY, BIOSPY,  G-TUBE PLACEMENT;  Surgeon: Karie Soda, MD;  Location: [facility]S;  Service: General;  Laterality: N/A;   TONSILLECTOMY  1957   TOTAL KNEE ARTHROPLASTY  1999 & 2001   Patient Active Problem List   Diagnosis Date Noted   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   Acute renal failure (HCC) 05/30/2022   Non-recurrent unilateral inguinal hernia without obstruction or gangrene 04/13/2022   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   Non-recurrent bilateral inguinal hernia without obstruction or gangrene 04/06/2022   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   Genetic testing 12/03/2021   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   Nausea & vomiting 11/06/2021   Hyponatremia 11/06/2021   Family history of colon cancer 11/05/2021   ARF (acute renal failure) (HCC) 11/05/2021   Irritant contact dermatitis associated with digestive stoma    Gastrostomy tube dysfunction (HCC)    Irritant contact dermatitis associated with fecal stoma    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 10/17/2021   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) 10/17/2021   Ileostomy care (HCC) 10/17/2021   Colonic mass 10/15/2021   History of breast cancer 12/30/2011   Leukopenia 04/09/2011   Anxiety state 04/06/2007   Esophageal reflux 04/06/2007   Essential hypertension 04/06/2007   Hyperlipidemia 04/06/2007   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    PCP: Soundra Pilon, FNP   REFERRING PROVIDER:  Ladene Artist, MD     REFERRING DIAG:  C48.1 (ICD-10-CM) - Extraovarian primary peritoneal carcinoma (HCC)      THERAPY DIAG:  Difficulty walking  Muscle weakness (generalized)  Rationale for Evaluation and Treatment: Rehabilitation  ONSET DATE: 05/29/22  SUBJECTIVE:   SUBJECTIVE STATEMENT:  Nothing new going on, no falls or close calls    PERTINENT HISTORY: Barrett's esophagus, breast ca, HTN, thyroid disease, stage IV extraovarian primary peritoneal carcinoma, laparascopic loop ileostomy 09/2021  Has had PT in the past for balance/falls  PAIN:  Are you having pain? Yes: NPRS scale: 1/10 Pain location: L low back Pain description: aching  Aggravating factors: "when I move my leg the wrong way it makes it hurt in my back"  (demonstrates marching) Relieving factors: not doing the motion that hurts   PRECAUTIONS: None  WEIGHT BEARING RESTRICTIONS: No  FALLS:  Has patient fallen in last 6 months? No  LIVING ENVIRONMENT: Lives with: lives with their spouse Lives in: House/apartment, has basement  (usually goes down there 1x/month)  Stairs: yes , 6 steps to enter with step to  pattern  Has following equipment at home: Single point cane, walker, shower grab bars   OCCUPATION: N/A Leisure: reading, computer games   PLOF: Independent with basic ADLs  PATIENT GOALS: getting her strength back,    OBJECTIVE:   DIAGNOSTIC FINDINGS: no relevant MSK imaging  PATIENT SURVEYS:  FOTO 41 52 @ DC 8 pts MCII  COGNITION: Overall cognitive status: Within functional limits for tasks assessed     SENSATION: WFL   POSTURE: rounded shoulders, forward head, and increased thoracic kyphosis   LOWER EXTREMITY ROM: WFL for tasks assessed; ileostomy limits hip flexion and ext ROM, stiffness noted with ER, IR, and knee ext due to HS length in seated  LOWER EXTREMITY MMT:  MMT Right eval Left eval  Hip flexion 4-/5 4/5  Hip extension    Hip abduction 4/5 4/5  Hip adduction 4/5 4/5  Hip internal rotation    Hip external rotation    Knee flexion 4-/5 4-/5  Knee extension 4/5 4/5  Ankle dorsiflexion    Ankle plantarflexion 4/5 4/5  Ankle inversion    Ankle eversion     (Blank rows = not tested)  FUNCTIONAL TESTS:  5 times sit to stand: 15.2s  Timed up and go (TUG): 14.3s  4 stage balance: passed NBOS, semi tandem, and tandem with R in rear, unable to perform SLS >2s  GAIT: Distance walked: 43ft Assistive device utilized: Single point cane Level of assistance: Complete Independence Comments: shortened step length, shuffling gait, low foot clearance   TODAY'S TREATMENT:                                                                                                                              DATE:   08/13/22  TherEx  Nustep L4x6 minutes BLEs only STS 6# x10 LAQs 2.5# x10 B Standing hip ABD 2.5# x10 B Gait training with 2.5# each LE, 10ft no device cues for foot clearance   NMR  Tandem stance solid surface 3x30 seconds B  SLS with one foot on 4 inch box 3x30 seconds B  Narrow BOS solid surface 2x30 seconds     6/19   Exercises - Sit to  Stand  - 3 x daily - 7 x weekly - 1 sets - 5 reps - Side Stepping with Resistance at Thighs and Counter Support  - 2 x daily - 7 x  weekly - 1 sets - 4 reps - Standing March with Counter Support  - 2 x daily - 7 x weekly - 1 sets - 20 reps   PATIENT EDUCATION:  Education details: MOI, diagnosis, prognosis, anatomy, exercise progression, DOMS expectations, envelope of function, HEP, POC  Person educated: Patient Education method: Explanation, Demonstration, Tactile cues, Verbal cues, and Handouts Education comprehension: verbalized understanding, returned demonstration, verbal cues required, and tactile cues required  HOME EXERCISE PROGRAM: Access Code: [email or web address] URL: [email or web address] Date: 08/11/2022 Prepared by: Zebedee Iba   ASSESSMENT:  CLINICAL IMPRESSION:  Ailsa arrives today doing well. Progressed strength and balance training as per POC, easily fatigued and needed multiple rest breaks during session. Balance in particular was very difficult. Will continue efforts.    OBJECTIVE IMPAIRMENTS: Abnormal gait, decreased activity tolerance, decreased balance, decreased endurance, decreased knowledge of use of DME, decreased mobility, difficulty walking, decreased ROM, decreased strength, hypomobility, impaired flexibility, improper body mechanics, postural dysfunction, and pain.    ACTIVITY LIMITATIONS: carrying, lifting, bending, standing, squatting, stairs, transfers, bed mobility, and locomotion level   PARTICIPATION LIMITATIONS: meal prep, cleaning, laundry, driving, shopping, and community activity and exercise   PERSONAL FACTORS: Age, Fitness, Time since onset of injury/illness/exacerbation, and 1-2 comorbidities:    are also affecting patient's functional outcome.    REHAB POTENTIAL: Fair     CLINICAL DECISION MAKING: Evolving/moderate complexity   EVALUATION COMPLEXITY: Moderate     GOALS:     SHORT TERM GOALS: Target date: 09/22/2022    Pt will  become independent with HEP in order to demonstrate synthesis of PT education.    Goal status: INITIAL   2.  Pt will be able to perform 5XSTS in under 12s  in order to demonstrate functional improvement above the cut off score for adults.   Goal status: INITIAL   3.  Pt will be able to demonstrate ability to perform gait with upright posture and AD  in order to demonstrate functional improvement in LE function for safety with community ambulation    Goal status: INITIAL       LONG TERM GOALS: Target date: 11/03/2022      Pt will be able to demonstrate TUG in under 10 sec in order to demonstrate functional improvement in LE function, strength, balance, and mobility for safety with community ambulation.   Goal status: INITIAL   Pt will decrease 5XSTS by at least 3 seconds in order to demonstrate clinically significant improvement in LE strength    Goal status: INITIAL   3.  Pt will be able to demonstrate/report ability to walk >10 mins in order to demonstrate functional improvement and tolerance to exercise and community mobility.    Goal status: INITIAL   4.  Pt will score >/= 57 on FOTO to demonstrate improvement in perceived mobility and function.    Goal status: INITIAL        PLAN:   PT FREQUENCY: 1-2x/week   PT DURATION: 12 weeks   PLANNED INTERVENTIONS: Therapeutic exercises, Therapeutic activity, Neuromuscular re-education, Balance training, Gait training, Patient/Family education, Self Care, Joint mobilization, Joint manipulation, Stair training, Prosthetic training, DME instructions, Aquatic Therapy, Dry Needling, Electrical stimulation, Spinal manipulation, Spinal mobilization, Moist heat, scar mobilization, Splintting, Taping, Vasopneumatic device, Traction, Ultrasound, Ionotophoresis 4mg /ml Dexamethasone, Manual therapy, and Re-evaluation   PLAN FOR NEXT SESSION: LE exercise for transfers/transfer mechanics, consider resistance circuits, standing balance  exercise, functional activity tolerance   Nedra Hai, PT, DPT 08/13/22 3:04 PM

## 2022-08-13 NOTE — Patient Instructions (Signed)
[facility]   Discharge Instructions: Thank you for choosing [facility] to provide your oncology and hematology care.   If you have a lab appointment with the Cancer Center, please go directly to the Cancer Center and check in at the registration area.   Wear comfortable clothing and clothing appropriate for easy access to any Portacath or PICC line.   We strive to give you quality time with your provider. You may need to reschedule your appointment if you arrive late (15 or more minutes).  Arriving late affects you and other patients whose appointments are after yours.  Also, if you miss three or more appointments without notifying the office, you may be dismissed from the clinic at the provider's discretion.      For prescription refill requests, have your pharmacy contact our office and allow 72 hours for refills to be completed.    Today you received the following chemotherapy and/or immunotherapy agents Zirabev.      To help prevent nausea and vomiting after your treatment, we encourage you to take your nausea medication as directed.  BELOW ARE SYMPTOMS THAT SHOULD BE REPORTED IMMEDIATELY: *FEVER GREATER THAN 100.4 F (38 C) OR HIGHER *CHILLS OR SWEATING *NAUSEA AND VOMITING THAT IS NOT CONTROLLED WITH YOUR NAUSEA MEDICATION *UNUSUAL SHORTNESS OF BREATH *UNUSUAL BRUISING OR BLEEDING *URINARY PROBLEMS (pain or burning when urinating, or frequent urination) *BOWEL PROBLEMS (unusual diarrhea, constipation, pain near the anus) TENDERNESS IN MOUTH AND THROAT WITH OR WITHOUT PRESENCE OF ULCERS (sore throat, sores in mouth, or a toothache) UNUSUAL RASH, SWELLING OR PAIN  UNUSUAL VAGINAL DISCHARGE OR ITCHING   Items with * indicate a potential emergency and should be followed up as soon as possible or go to the Emergency Department if any problems should occur.  Please show the CHEMOTHERAPY ALERT CARD or IMMUNOTHERAPY ALERT CARD at  check-in to the Emergency Department and triage nurse.  Should you have questions after your visit or need to cancel or reschedule your appointment, please contact [place] [facility]  Dept: [phone]  and follow the prompts.  Office hours are 8:00 a.m. to 4:30 p.m. Monday - Friday. Please note that voicemails left after 4:00 p.m. may not be returned until the following business day.  We are closed weekends and major holidays. You have access to a nurse at all times for urgent questions. Please call the main number to the clinic Dept: [phone] and follow the prompts.   For any non-urgent questions, you may also contact your provider using MyChart. We now offer e-Visits for anyone 22 and older to request care online for non-urgent symptoms. For details visit [email or web address].   Also download the MyChart app! Go to the app store, search "MyChart", open the app, select [facility], and log in with your MyChart username and password.  Bevacizumab Injection What is this medication? BEVACIZUMAB (be va SIZ yoo mab) treats some types of cancer. It works by blocking a protein that causes cancer cells to grow and multiply. This helps to slow or stop the spread of cancer cells. It is a monoclonal antibody. This medicine may be used for other purposes; ask your health care provider or pharmacist if you have questions. COMMON BRAND NAME(S): Alymsys, Avastin, MVASI, Omer Jack What should I tell my care team before I take this medication? They need to know if you have any of these conditions: Blood clots Coughing up blood Having or recent surgery Heart failure  High blood pressure History of a connection between 2 or more body parts that do not usually connect (fistula) History of a tear in your stomach or intestines Protein in your urine An unusual or allergic reaction to bevacizumab, other medications, foods, dyes, or preservatives Pregnant or trying to get  pregnant Breast-feeding How should I use this medication? This medication is injected into a vein. It is given by your care team in a hospital or clinic setting. Talk to your care team the use of this medication in children. Special care may be needed. Overdosage: If you think you have taken too much of this medicine contact a poison control center or emergency room at once. NOTE: This medicine is only for you. Do not share this medicine with others. What if I miss a dose? Keep appointments for follow-up doses. It is important not to miss your dose. Call your care team if you are unable to keep an appointment. What may interact with this medication? Interactions are not expected. This list may not describe all possible interactions. Give your health care provider a list of all the medicines, herbs, non-prescription drugs, or dietary supplements you use. Also tell them if you smoke, drink alcohol, or use illegal drugs. Some items may interact with your medicine. What should I watch for while using this medication? Your condition will be monitored carefully while you are receiving this medication. You may need blood work while taking this medication. This medication may make you feel generally unwell. This is not uncommon as chemotherapy can affect healthy cells as well as cancer cells. Report any side effects. Continue your course of treatment even though you feel ill unless your care team tells you to stop. This medication may increase your risk to bruise or bleed. Call your care team if you notice any unusual bleeding. Before having surgery, talk to your care team to make sure it is ok. This medication can increase the risk of poor healing of your surgical site or wound. You will need to stop this medication for 28 days before surgery. After surgery, wait at least 28 days before restarting this medication. Make sure the surgical site or wound is healed enough before restarting this medication. Talk  to your care team if questions. Talk to your care team if you may be pregnant. Serious birth defects can occur if you take this medication during pregnancy and for 6 months after the last dose. Contraception is recommended while taking this medication and for 6 months after the last dose. Your care team can help you find the option that works for you. Do not breastfeed while taking this medication and for 6 months after the last dose. This medication can cause infertility. Talk to your care team if you are concerned about your fertility. What side effects may I notice from receiving this medication? Side effects that you should report to your care team as soon as possible: Allergic reactions--skin rash, itching, hives, swelling of the face, lips, tongue, or throat Bleeding--bloody or black, tar-like stools, vomiting blood or brown material that looks like coffee grounds, red or dark brown urine, small red or purple spots on skin, unusual bruising or bleeding Blood clot--pain, swelling, or warmth in the leg, shortness of breath, chest pain Heart attack--pain or tightness in the chest, shoulders, arms, or jaw, nausea, shortness of breath, cold or clammy skin, feeling faint or lightheaded Heart failure--shortness of breath, swelling of the ankles, feet, or hands, sudden weight gain, unusual weakness or fatigue   Increase in blood pressure Infection--fever, chills, cough, sore throat, wounds that don't heal, pain or trouble when passing urine, general feeling of discomfort or being unwell Infusion reactions--chest pain, shortness of breath or trouble breathing, feeling faint or lightheaded Kidney injury--decrease in the amount of urine, swelling of the ankles, hands, or feet Stomach pain that is severe, does not go away, or gets worse Stroke--sudden numbness or weakness of the face, arm, or leg, trouble speaking, confusion, trouble walking, loss of balance or coordination, dizziness, severe headache,  change in vision Sudden and severe headache, confusion, change in vision, seizures, which may be signs of posterior reversible encephalopathy syndrome (PRES) Side effects that usually do not require medical attention (report to your care team if they continue or are bothersome): Back pain Change in taste Diarrhea Dry skin Increased tears Nosebleed This list may not describe all possible side effects. Call your doctor for medical advice about side effects. You may report side effects to FDA at 1-800-FDA-1088. Where should I keep my medication? This medication is given in a hospital or clinic. It will not be stored at home. NOTE: This sheet is a summary. It may not cover all possible information. If you have questions about this medicine, talk to your doctor, pharmacist, or health care provider.  2024 Elsevier/Gold Standard (2021-06-26 00:00:00)

## 2022-08-13 NOTE — Progress Notes (Signed)
Patient seen by Dr. Truett Perna today  Vitals are within treatment parameters.No intervention for BP 138/81  Labs reviewed by Dr. Truett Perna and are within treatment parameters.Add on BMP is not needed to treat today-it is for future CT scan  Per physician team, patient is ready for treatment and there are NO modifications to the treatment plan.

## 2022-08-14 ENCOUNTER — Other Ambulatory Visit: Payer: Self-pay

## 2022-08-15 ENCOUNTER — Other Ambulatory Visit: Payer: Self-pay

## 2022-08-16 ENCOUNTER — Encounter (HOSPITAL_BASED_OUTPATIENT_CLINIC_OR_DEPARTMENT_OTHER): Payer: Self-pay

## 2022-08-16 ENCOUNTER — Ambulatory Visit (HOSPITAL_BASED_OUTPATIENT_CLINIC_OR_DEPARTMENT_OTHER): Payer: Medicare HMO

## 2022-08-16 DIAGNOSIS — M6281 Muscle weakness (generalized): Secondary | ICD-10-CM | POA: Diagnosis not present

## 2022-08-16 DIAGNOSIS — R262 Difficulty in walking, not elsewhere classified: Secondary | ICD-10-CM | POA: Diagnosis not present

## 2022-08-16 DIAGNOSIS — C481 Malignant neoplasm of specified parts of peritoneum: Secondary | ICD-10-CM | POA: Diagnosis not present

## 2022-08-16 NOTE — Therapy (Signed)
OUTPATIENT PHYSICAL THERAPY LOWER EXTREMITY TREATMENT   Patient Name: Nancy Harrison MRN: [id number] DOB:May 17, 1940, 82 y.o., female Today's Date: 08/16/2022  END OF SESSION:  PT End of Session - 08/16/22 1558     Visit Number 3    Number of Visits 18    Date for PT Re-Evaluation 11/09/22    Authorization Type [employer] MCR    Progress Note Due on Visit 10    PT Start Time 1516    PT Stop Time 1554    PT Time Calculation (min) 38 min    Activity Tolerance Patient tolerated treatment well    Behavior During Therapy [facility] for tasks assessed/performed               Past Medical History:  Diagnosis Date   Arthritis    Barrett's esophagus    Path 10/2001   Breast cancer (HCC) 01/01/2011   Cancer (HCC) 1999   rt breast   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   Cataract    Family history of colon cancer    Gastrostomy in place [facility])    LUQ - placed 10/16/2021   Hypertension    Ileostomy, has currently (HCC)    placed 09/2021   SUI (stress urinary incontinence, female)    Thyroid disease    Urinary urgency    Past Surgical History:  Procedure Laterality Date   BREAST LUMPECTOMY  1999   Chemo and radiation on right breast   IR IMAGING GUIDED PORT INSERTION  11/11/2021   LAPAROSCOPY N/A 10/16/2021   Procedure: LAPAROSCOPIC  DIVERTING OSTOMY, BIOSPY,  G-TUBE PLACEMENT;  Surgeon: Karie Soda, MD;  Location: [facility];  Service: General;  Laterality: N/A;   TONSILLECTOMY  1957   TOTAL KNEE ARTHROPLASTY  1999 & 2001   Patient Active Problem List   Diagnosis Date Noted   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   Acute renal failure (HCC) 05/30/2022   Non-recurrent unilateral inguinal hernia without obstruction or gangrene 04/13/2022   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   Non-recurrent bilateral inguinal hernia without obstruction or gangrene 04/06/2022   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   Genetic testing 12/03/2021   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   Nausea & vomiting 11/06/2021   Hyponatremia 11/06/2021   Family history of colon cancer 11/05/2021   ARF (acute renal failure) (HCC) 11/05/2021   Irritant contact dermatitis associated with digestive stoma    Gastrostomy tube dysfunction (HCC)    Irritant contact dermatitis associated with fecal stoma    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 10/17/2021   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) 10/17/2021   Ileostomy care (HCC) 10/17/2021   Colonic mass 10/15/2021   History of breast cancer 12/30/2011   Leukopenia 04/09/2011   Anxiety state 04/06/2007   Esophageal reflux 04/06/2007   Essential hypertension 04/06/2007   Hyperlipidemia 04/06/2007   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 5/10 pain level in L side of low back. 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 Yes: NPRS scale: 5/10 Pain location: L low back Pain description: aching  Aggravating factors: "when I move my leg the wrong way it makes it hurt in my back"  (demonstrates marching) Relieving factors: not doing the motion that hurts   PRECAUTIONS: None  WEIGHT BEARING RESTRICTIONS: No  FALLS:  Has patient fallen in last 6 months? No  LIVING ENVIRONMENT: Lives with: lives with their spouse Lives in: House/apartment, has basement  (usually goes down there 1x/month)  Stairs: yes  , 6 steps to enter with step to pattern  Has following equipment at home: Single point cane, walker, shower grab bars   OCCUPATION: N/A Leisure: reading, computer games   PLOF: Independent with basic ADLs  PATIENT GOALS: getting her strength back,    OBJECTIVE:   DIAGNOSTIC FINDINGS: no relevant MSK imaging  PATIENT SURVEYS:  FOTO 41 52 @ DC 8 pts MCII  COGNITION: Overall cognitive status: Within functional limits for tasks assessed     SENSATION: WFL   POSTURE: rounded shoulders, forward head, and increased thoracic kyphosis   LOWER EXTREMITY ROM: WFL for tasks assessed; ileostomy limits hip flexion and ext ROM, stiffness noted with ER, IR, and knee ext due to HS length in seated  LOWER EXTREMITY MMT:  MMT Right eval Left eval  Hip flexion 4-/5 4/5  Hip extension    Hip abduction 4/5 4/5  Hip adduction 4/5 4/5  Hip internal rotation    Hip external rotation    Knee flexion 4-/5 4-/5  Knee extension 4/5 4/5  Ankle dorsiflexion    Ankle plantarflexion 4/5 4/5  Ankle inversion    Ankle eversion     (Blank rows = not tested)  FUNCTIONAL TESTS:  5 times sit to stand: 15.2s  Timed up and go (TUG): 14.3s  4 stage balance: passed NBOS, semi tandem, and tandem with R in rear, unable to perform SLS >2s  GAIT: Distance walked: 59ft Assistive device utilized: Single point cane Level of assistance: Complete Independence Comments: shortened step length, shuffling gait, low foot clearance   TODAY'S TREATMENT:                                                                                                                              DATE:   08/16/22  TherEx  Nustep L4x6 minutes BLEs only L stretch at rail- 20sec x3 Heel raises 2x10 Partial squats 2x10 STS x10 Seated QL stretch 20sec x3 to R LAQs 2.5# 2x10 B Standing hip ABD 2.5# x10 B Gait training full hall x1   08/13/22  TherEx  Nustep L4x6 minutes BLEs only STS 6# x10 LAQs 2.5# x10  B Standing hip ABD 2.5# x10 B Gait training with 2.5# each LE, 27ft no device cues for foot clearance   NMR  Tandem stance solid surface 3x30 seconds B  SLS with one foot on 4 inch box  3x30 seconds B  Narrow BOS solid surface 2x30 seconds      PATIENT EDUCATION:  Education details: MOI, diagnosis, prognosis, anatomy, exercise progression, DOMS expectations, envelope of function, HEP, POC  Person educated: Patient Education method: Explanation, Demonstration, Tactile cues, Verbal cues, and Handouts Education comprehension: verbalized understanding, returned demonstration, verbal cues required, and tactile cues required  HOME EXERCISE PROGRAM: Access Code: [email or web address] URL: [email or web address] Date: 08/11/2022 Prepared by: Zebedee Iba   ASSESSMENT:  CLINICAL IMPRESSION:  Pt reported L sided low back tightness and pain with standing marches. Pt felt relief with L stretch at railing. Instructed her how to perform this at home and provided printout. She fatigues quickly with standing exercises, so alternated standing/seated exercises as necessary. Will continue to progress as tolerated and monitor LBP.   OBJECTIVE IMPAIRMENTS: Abnormal gait, decreased activity tolerance, decreased balance, decreased endurance, decreased knowledge of use of DME, decreased mobility, difficulty walking, decreased ROM, decreased strength, hypomobility, impaired flexibility, improper body mechanics, postural dysfunction, and pain.    ACTIVITY LIMITATIONS: carrying, lifting, bending, standing, squatting, stairs, transfers, bed mobility, and locomotion level   PARTICIPATION LIMITATIONS: meal prep, cleaning, laundry, driving, shopping, and community activity and exercise   PERSONAL FACTORS: Age, Fitness, Time since onset of injury/illness/exacerbation, and 1-2 comorbidities:    are also affecting patient's functional outcome.    REHAB POTENTIAL: Fair     CLINICAL DECISION MAKING:  Evolving/moderate complexity   EVALUATION COMPLEXITY: Moderate     GOALS:     SHORT TERM GOALS: Target date: 09/22/2022    Pt will become independent with HEP in order to demonstrate synthesis of PT education.    Goal status: INITIAL   2.  Pt will be able to perform 5XSTS in under 12s  in order to demonstrate functional improvement above the cut off score for adults.   Goal status: INITIAL   3.  Pt will be able to demonstrate ability to perform gait with upright posture and AD  in order to demonstrate functional improvement in LE function for safety with community ambulation    Goal status: INITIAL       LONG TERM GOALS: Target date: 11/03/2022      Pt will be able to demonstrate TUG in under 10 sec in order to demonstrate functional improvement in LE function, strength, balance, and mobility for safety with community ambulation.   Goal status: INITIAL   Pt will decrease 5XSTS by at least 3 seconds in order to demonstrate clinically significant improvement in LE strength    Goal status: INITIAL   3.  Pt will be able to demonstrate/report ability to walk >10 mins in order to demonstrate functional improvement and tolerance to exercise and community mobility.    Goal status: INITIAL   4.  Pt will score >/= 57 on FOTO to demonstrate improvement in perceived mobility and function.    Goal status: INITIAL        PLAN:   PT FREQUENCY: 1-2x/week   PT DURATION: 12 weeks   PLANNED INTERVENTIONS: Therapeutic exercises, Therapeutic activity, Neuromuscular re-education, Balance training, Gait training, Patient/Family education, Self Care, Joint mobilization, Joint manipulation, Stair training, Prosthetic training, DME instructions, Aquatic Therapy, Dry Needling, Electrical stimulation, Spinal manipulation, Spinal mobilization, Moist heat, scar mobilization, Splintting, Taping, Vasopneumatic device, Traction, Ultrasound, Ionotophoresis 4mg /ml Dexamethasone, Manual therapy, and  Re-evaluation   PLAN FOR NEXT SESSION: LE exercise for transfers/transfer mechanics, consider resistance circuits, standing balance exercise, functional activity tolerance   Riki Altes, PTA  08/16/22 3:59 PM

## 2022-08-17 ENCOUNTER — Other Ambulatory Visit: Payer: Self-pay | Admitting: Oncology

## 2022-08-19 ENCOUNTER — Ambulatory Visit (HOSPITAL_BASED_OUTPATIENT_CLINIC_OR_DEPARTMENT_OTHER): Payer: Medicare HMO

## 2022-08-19 ENCOUNTER — Encounter (HOSPITAL_BASED_OUTPATIENT_CLINIC_OR_DEPARTMENT_OTHER): Payer: Self-pay

## 2022-08-19 DIAGNOSIS — C481 Malignant neoplasm of specified parts of peritoneum: Secondary | ICD-10-CM | POA: Diagnosis not present

## 2022-08-19 DIAGNOSIS — R262 Difficulty in walking, not elsewhere classified: Secondary | ICD-10-CM

## 2022-08-19 DIAGNOSIS — M6281 Muscle weakness (generalized): Secondary | ICD-10-CM | POA: Diagnosis not present

## 2022-08-19 NOTE — Therapy (Signed)
OUTPATIENT PHYSICAL THERAPY LOWER EXTREMITY TREATMENT   Patient Name: Nancy Harrison MRN: [id number] DOB:1940-12-14, 82 y.o., female Today's Date: 08/19/2022  END OF SESSION:  PT End of Session - 08/19/22 1443     Visit Number 4    Number of Visits 18    Date for PT Re-Evaluation 11/09/22    Authorization Type [employer] MCR    Progress Note Due on Visit 10    PT Start Time 1435    PT Stop Time 1515    PT Time Calculation (min) 40 min    Equipment Utilized During Treatment Gait belt    Activity Tolerance Patient tolerated treatment well    Behavior During Therapy WFL for tasks assessed/performed                Past Medical History:  Diagnosis Date   Arthritis    Barrett's esophagus    Path 10/2001   Breast cancer (HCC) 01/01/2011   Cancer (HCC) 1999   rt breast   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   Cataract    Family history of colon cancer    Gastrostomy in place [facility])    LUQ - placed 10/16/2021   Hypertension    Ileostomy, has currently (HCC)    placed 09/2021   SUI (stress urinary incontinence, female)    Thyroid disease    Urinary urgency    Past Surgical History:  Procedure Laterality Date   BREAST LUMPECTOMY  1999   Chemo and radiation on right breast   IR IMAGING GUIDED PORT INSERTION  11/11/2021   LAPAROSCOPY N/A 10/16/2021   Procedure: LAPAROSCOPIC  DIVERTING OSTOMY, BIOSPY,  G-TUBE PLACEMENT;  Surgeon: Karie Soda, MD;  Location: [facility];  Service: General;  Laterality: N/A;   TONSILLECTOMY  1957   TOTAL KNEE ARTHROPLASTY  1999 & 2001   Patient Active Problem List   Diagnosis Date Noted   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   Acute renal failure (HCC) 05/30/2022   Non-recurrent unilateral inguinal hernia without obstruction or gangrene 04/13/2022   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   Non-recurrent bilateral inguinal hernia without obstruction or gangrene 04/06/2022   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   Genetic testing 12/03/2021    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   Nausea & vomiting 11/06/2021   Hyponatremia 11/06/2021   Family history of colon cancer 11/05/2021   ARF (acute renal failure) (HCC) 11/05/2021   Irritant contact dermatitis associated with digestive stoma    Gastrostomy tube dysfunction (HCC)    Irritant contact dermatitis associated with fecal stoma    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 10/17/2021   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) 10/17/2021   Ileostomy care (HCC) 10/17/2021   Colonic mass 10/15/2021   History of breast cancer 12/30/2011   Leukopenia 04/09/2011   Anxiety state 04/06/2007   Esophageal reflux 04/06/2007   Essential hypertension 04/06/2007   Hyperlipidemia 04/06/2007   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 0/10 pain level in back at entry. Only mild soreness after last session.    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 No: NPRS scale: 0/10 Pain location: L low back Pain description: aching  Aggravating factors: "when I move my leg the wrong way it makes it hurt in my back"  (demonstrates marching) Relieving factors: not doing the motion that hurts   PRECAUTIONS: None  WEIGHT BEARING RESTRICTIONS: No  FALLS:  Has patient fallen in last 6 months? No  LIVING ENVIRONMENT: Lives with: lives with their spouse Lives in: House/apartment, has  basement  (usually goes down there 1x/month)  Stairs: yes , 6 steps to enter with step to pattern  Has following equipment at home: Single point cane, walker, shower grab bars   OCCUPATION: N/A Leisure: reading, computer games   PLOF: Independent with basic ADLs  PATIENT GOALS: getting her strength back,    OBJECTIVE:   DIAGNOSTIC FINDINGS: no relevant MSK imaging  PATIENT SURVEYS:  FOTO 41 52 @ DC 8 pts MCII  COGNITION: Overall cognitive status: Within functional limits for tasks assessed     SENSATION: WFL   POSTURE: rounded shoulders, forward head, and increased thoracic kyphosis   LOWER EXTREMITY ROM: WFL for tasks assessed; ileostomy limits hip flexion and ext ROM, stiffness noted with ER, IR, and knee ext due to HS length in seated  LOWER EXTREMITY MMT:  MMT Right eval Left eval  Hip flexion 4-/5 4/5  Hip extension    Hip abduction 4/5 4/5  Hip adduction 4/5 4/5  Hip internal rotation    Hip external rotation    Knee flexion 4-/5 4-/5  Knee extension 4/5 4/5  Ankle dorsiflexion    Ankle plantarflexion 4/5 4/5  Ankle inversion    Ankle eversion     (Blank rows = not tested)  FUNCTIONAL TESTS:  5 times sit to stand: 15.2s  Timed up and go (TUG): 14.3s  4 stage balance: passed NBOS, semi tandem, and tandem with R in rear, unable to perform SLS >2s  GAIT: Distance walked: 17ft Assistive device utilized: Single point cane Level of assistance: Complete Independence Comments: shortened step length, shuffling gait, low foot clearance   TODAY'S TREATMENT:                                                                                                                              DATE:    08/19/22  TherEx  Nustep L4x6 minutes BLEs only L stretch at rail- 20sec x3 Heel raises 2x10 Partial squats 2x10 LAQs 2.5# 2x10 B Standing hip ABD 2.5# x10 B Gait training full hall x1 with SPC, x38ft without cane Gait with head turns/nods with SPC Hip  hikes 2x15 for R hip drop (floor level) FT on airex x30sec FA with EC on airex x30s FT on airexwith head turns/nods x30s ea  08/16/22  TherEx  Nustep L4x6 minutes BLEs only L stretch at rail- 20sec x3 Heel  raises 2x10 Partial squats 2x10 STS x10 Seated QL stretch 20sec x3 to R LAQs 2.5# 2x10 B Standing hip ABD 2.5# x10 B Gait training full hall x1  08/13/22  TherEx  Nustep L4x6 minutes BLEs only STS 6# x10 LAQs 2.5# x10 B Standing hip ABD 2.5# x10 B Gait training with 2.5# each LE, 21ft no device cues for foot clearance   NMR  Tandem stance solid surface 3x30 seconds B  SLS with one foot on 4 inch box 3x30 seconds B  Narrow BOS solid surface 2x30 seconds      PATIENT EDUCATION:  Education details: MOI, diagnosis, prognosis, anatomy, exercise progression, DOMS expectations, envelope of function, HEP, POC  Person educated: Patient Education method: Explanation, Demonstration, Tactile cues, Verbal cues, and Handouts Education comprehension: verbalized understanding, returned demonstration, verbal cues required, and tactile cues required  HOME EXERCISE PROGRAM: Access Code: [email or web address] URL: [email or web address] Date: 08/11/2022 Prepared by: Zebedee Iba   ASSESSMENT:  CLINICAL IMPRESSION:  Overall good tolerance for exercise. Able to complete standing marches, though did have some left sided discomfort in low back. This was relieved slightly by L stretch at railing. She is challenged by gait without SPC and demonstrates severe R hip drop. Overall good stability noted with balance challenges, though SBA required. Pt will benefit from continue PT to work on strengthening and improve functional capacity.    OBJECTIVE IMPAIRMENTS: Abnormal gait, decreased activity tolerance, decreased balance, decreased endurance, decreased knowledge of use of DME, decreased mobility, difficulty walking, decreased ROM, decreased strength, hypomobility, impaired flexibility,  improper body mechanics, postural dysfunction, and pain.    ACTIVITY LIMITATIONS: carrying, lifting, bending, standing, squatting, stairs, transfers, bed mobility, and locomotion level   PARTICIPATION LIMITATIONS: meal prep, cleaning, laundry, driving, shopping, and community activity and exercise   PERSONAL FACTORS: Age, Fitness, Time since onset of injury/illness/exacerbation, and 1-2 comorbidities:    are also affecting patient's functional outcome.    REHAB POTENTIAL: Fair     CLINICAL DECISION MAKING: Evolving/moderate complexity   EVALUATION COMPLEXITY: Moderate     GOALS:     SHORT TERM GOALS: Target date: 09/22/2022    Pt will become independent with HEP in order to demonstrate synthesis of PT education.    Goal status: INITIAL   2.  Pt will be able to perform 5XSTS in under 12s  in order to demonstrate functional improvement above the cut off score for adults.   Goal status: INITIAL   3.  Pt will be able to demonstrate ability to perform gait with upright posture and AD  in order to demonstrate functional improvement in LE function for safety with community ambulation    Goal status: INITIAL       LONG TERM GOALS: Target date: 11/03/2022      Pt will be able to demonstrate TUG in under 10 sec in order to demonstrate functional improvement in LE function, strength, balance, and mobility for safety with community ambulation.   Goal status: INITIAL   Pt will decrease 5XSTS by at least 3 seconds in order to demonstrate clinically significant improvement in LE strength    Goal status: INITIAL   3.  Pt will be able to demonstrate/report ability to walk >10 mins in order to demonstrate functional improvement and tolerance to exercise and community mobility.    Goal status: INITIAL   4.  Pt will score >/= 57 on FOTO to demonstrate improvement in perceived mobility and function.    Goal status: INITIAL  PLAN:   PT FREQUENCY: 1-2x/week   PT DURATION: 12  weeks   PLANNED INTERVENTIONS: Therapeutic exercises, Therapeutic activity, Neuromuscular re-education, Balance training, Gait training, Patient/Family education, Self Care, Joint mobilization, Joint manipulation, Stair training, Prosthetic training, DME instructions, Aquatic Therapy, Dry Needling, Electrical stimulation, Spinal manipulation, Spinal mobilization, Moist heat, scar mobilization, Splintting, Taping, Vasopneumatic device, Traction, Ultrasound, Ionotophoresis 4mg /ml Dexamethasone, Manual therapy, and Re-evaluation   PLAN FOR NEXT SESSION: LE exercise for transfers/transfer mechanics, consider resistance circuits, standing balance exercise, functional activity tolerance   Riki Altes, PTA  08/19/22 4:46 PM

## 2022-08-20 ENCOUNTER — Telehealth: Payer: Self-pay

## 2022-08-20 NOTE — Telephone Encounter (Signed)
Please send a fax with a prescription for patient ostomy supplies to [place].

## 2022-08-24 ENCOUNTER — Ambulatory Visit (HOSPITAL_BASED_OUTPATIENT_CLINIC_OR_DEPARTMENT_OTHER): Payer: Medicare HMO | Attending: Oncology | Admitting: Physical Therapy

## 2022-08-24 ENCOUNTER — Encounter (HOSPITAL_BASED_OUTPATIENT_CLINIC_OR_DEPARTMENT_OTHER): Payer: Self-pay | Admitting: Physical Therapy

## 2022-08-24 DIAGNOSIS — M6281 Muscle weakness (generalized): Secondary | ICD-10-CM | POA: Insufficient documentation

## 2022-08-24 DIAGNOSIS — R262 Difficulty in walking, not elsewhere classified: Secondary | ICD-10-CM | POA: Insufficient documentation

## 2022-08-24 NOTE — Therapy (Signed)
OUTPATIENT PHYSICAL THERAPY LOWER EXTREMITY TREATMENT   Patient Name: Nancy Harrison MRN: [id number] DOB:1940-12-14, 82 y.o., female Today's Date: 08/24/2022  END OF SESSION:  PT End of Session - 08/24/22 0850     Visit Number 5    Number of Visits 18    Date for PT Re-Evaluation 11/09/22    Authorization Type [employer] MCR    Progress Note Due on Visit 10    PT Start Time 0845    PT Stop Time 0925    PT Time Calculation (min) 40 min    Equipment Utilized During Treatment Gait belt    Activity Tolerance Patient tolerated treatment well    Behavior During Therapy WFL for tasks assessed/performed                 Past Medical History:  Diagnosis Date   Arthritis    Barrett's esophagus    Path 10/2001   Breast cancer (HCC) 01/01/2011   Cancer (HCC) 1999   rt breast   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   Cataract    Family history of colon cancer    Gastrostomy in place [facility])    LUQ - placed 10/16/2021   Hypertension    Ileostomy, has currently (HCC)    placed 09/2021   SUI (stress urinary incontinence, female)    Thyroid disease    Urinary urgency    Past Surgical History:  Procedure Laterality Date   BREAST LUMPECTOMY  1999   Chemo and radiation on right breast   IR IMAGING GUIDED PORT INSERTION  11/11/2021   LAPAROSCOPY N/A 10/16/2021   Procedure: LAPAROSCOPIC  DIVERTING OSTOMY, BIOSPY,  G-TUBE PLACEMENT;  Surgeon: Karie Soda, MD;  Location: [facility];  Service: General;  Laterality: N/A;   TONSILLECTOMY  1957   TOTAL KNEE ARTHROPLASTY  1999 & 2001   Patient Active Problem List   Diagnosis Date Noted   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   Acute renal failure (HCC) 05/30/2022   Non-recurrent unilateral inguinal hernia without obstruction or gangrene 04/13/2022   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   Non-recurrent bilateral inguinal hernia without obstruction or gangrene 04/06/2022   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   Genetic testing 12/03/2021    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   Nausea & vomiting 11/06/2021   Hyponatremia 11/06/2021   Family history of colon cancer 11/05/2021   ARF (acute renal failure) (HCC) 11/05/2021   Irritant contact dermatitis associated with digestive stoma    Gastrostomy tube dysfunction (HCC)    Irritant contact dermatitis associated with fecal stoma    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 10/17/2021   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) 10/17/2021   Ileostomy care (HCC) 10/17/2021   Colonic mass 10/15/2021   History of breast cancer 12/30/2011   Leukopenia 04/09/2011   Anxiety state 04/06/2007   Esophageal reflux 04/06/2007   Essential hypertension 04/06/2007   Hyperlipidemia 04/06/2007   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 No issues otherwise. She feels her low back but no  increase in pain.    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 No: NPRS scale: 0/10 Pain location: L low back Pain description: aching  Aggravating factors: "when I move my leg the wrong way it makes it hurt in my back"  (demonstrates marching) Relieving factors: not doing the motion that hurts   PRECAUTIONS: None  WEIGHT BEARING RESTRICTIONS: No  FALLS:  Has patient fallen in last 6 months? No  LIVING  ENVIRONMENT: Lives with: lives with their spouse Lives in: House/apartment, has basement  (usually goes down there 1x/month)  Stairs: yes , 6 steps to enter with step to pattern  Has following equipment at home: Single point cane, walker, shower grab bars   OCCUPATION: N/A Leisure: reading, computer games   PLOF: Independent with basic ADLs  PATIENT GOALS: getting her strength back,    OBJECTIVE:   DIAGNOSTIC FINDINGS: no relevant MSK imaging  PATIENT SURVEYS:  FOTO 41 52 @ DC 8 pts MCII  COGNITION: Overall cognitive status: Within functional limits for tasks assessed     SENSATION: WFL   POSTURE: rounded shoulders, forward head, and increased thoracic kyphosis   LOWER EXTREMITY ROM: WFL for tasks assessed; ileostomy limits hip flexion and ext ROM, stiffness noted with ER, IR, and knee ext due to HS length in seated  LOWER EXTREMITY MMT:  MMT Right eval Left eval  Hip flexion 4-/5 4/5  Hip extension    Hip abduction 4/5 4/5  Hip adduction 4/5 4/5  Hip internal rotation    Hip external rotation    Knee flexion 4-/5 4-/5  Knee extension 4/5 4/5  Ankle dorsiflexion    Ankle plantarflexion 4/5 4/5  Ankle inversion    Ankle eversion     (Blank rows = not tested)  FUNCTIONAL TESTS:  5 times sit to stand: 15.2s  Timed up and go (TUG): 14.3s  4 stage balance: passed NBOS, semi tandem, and tandem with R in rear, unable to perform SLS >2s  GAIT: Distance walked: 23ft Assistive device utilized: Single point cane Level of assistance: Complete Independence Comments: shortened step length, shuffling gait, low foot clearance   TODAY'S TREATMENT:                                                                                                                              DATE:   7/2   Nustep L4x8 minutes UE and LE  L stretch at rail- 20sec x3 (with R SB)  Sidestepping and retro walk at rail YTB at knees 3x laps  Heel raises 2x10 STS YTB at knees 3x8 4"  box step ups 2x10 Feet together with EC on airex 30s 3x Airex march 2x20 Gait with head turns down rail 3x laps at 70 BPM   08/19/22  TherEx  Nustep  L4x6 minutes BLEs only L stretch at rail- 20sec x3 Heel raises 2x10 Partial squats 2x10 LAQs 2.5# 2x10 B Standing hip ABD 2.5# x10 B Gait training full hall x1 with SPC, x45ft without cane Gait with head turns/nods with SPC Hip hikes 2x15 for R hip drop (floor level) FT on airex x30sec FA with EC on airex x30s FT on airexwith head turns/nods x30s ea  08/16/22  TherEx  Nustep L4x6 minutes BLEs only L stretch at rail- 20sec x3 Heel raises 2x10 Partial squats 2x10 STS x10 Seated QL stretch 20sec x3 to R LAQs 2.5# 2x10 B Standing hip ABD 2.5# x10 B Gait training full hall x1  08/13/22  TherEx  Nustep L4x6 minutes BLEs only STS 6# x10 LAQs 2.5# x10 B Standing hip ABD 2.5# x10 B Gait training with 2.5# each LE, 34ft no device cues for foot clearance   NMR  Tandem stance solid surface 3x30 seconds B  SLS with one foot on 4 inch box 3x30 seconds B  Narrow BOS solid surface 2x30 seconds      PATIENT EDUCATION:  Education details: anatomy, exercise progression, DOMS expectations, envelope of function, HEP, POC  Person educated: Patient Education method: Explanation, Demonstration, Tactile cues, Verbal cues, and Handouts Education comprehension: verbalized understanding, returned demonstration, verbal cues required, and tactile cues required  HOME EXERCISE PROGRAM: Access Code: [email or web address] URL: [email or web address] Date: 08/11/2022 Prepared by: Zebedee Iba   ASSESSMENT:  CLINICAL IMPRESSION:  Pt able to progress with LE strength, endurance, and dynamic balance at today's session. Pt still requires regular seated rest breaks due to increase in fatigue and muscle weakness but able to recovery within 30s-89min between sets. Pt with more SL instability and poor foot clearance with step ups without VC  for hip flexion. Pt advised to start walking program at home in addition to HEP. HEP kept at 5 exercises to promote compliance. Plan to continue with general capacity as tolerated. Could consider circuits as tolerated. Pt will benefit from continue PT to work on strengthening and improve functional capacity.    OBJECTIVE IMPAIRMENTS: Abnormal gait, decreased activity tolerance, decreased balance, decreased endurance, decreased knowledge of use of DME, decreased mobility, difficulty walking, decreased ROM, decreased strength, hypomobility, impaired flexibility, improper body mechanics, postural dysfunction, and pain.    ACTIVITY LIMITATIONS: carrying, lifting, bending, standing, squatting, stairs, transfers, bed mobility, and locomotion level   PARTICIPATION LIMITATIONS: meal prep, cleaning, laundry, driving, shopping, and community activity and exercise   PERSONAL FACTORS: Age, Fitness, Time since onset of injury/illness/exacerbation, and 1-2 comorbidities:    are also affecting patient's functional outcome.    REHAB POTENTIAL: Fair     CLINICAL DECISION MAKING: Evolving/moderate complexity   EVALUATION COMPLEXITY: Moderate     GOALS:     SHORT TERM GOALS: Target date: 09/22/2022    Pt will become independent with HEP in order to demonstrate synthesis of PT education.    Goal status: INITIAL   2.  Pt will be able to perform 5XSTS in under 12s  in order to demonstrate functional improvement above the cut off score for adults.   Goal status: INITIAL   3.  Pt will be able to demonstrate ability to perform gait with upright posture and AD  in order to demonstrate functional improvement in LE function for safety with community ambulation    Goal status: INITIAL       LONG TERM GOALS: Target date: 11/03/2022      Pt will be able to demonstrate TUG in  under 10 sec in order to demonstrate functional improvement in LE function, strength, balance, and mobility for safety with community  ambulation.   Goal status: INITIAL   Pt will decrease 5XSTS by at least 3 seconds in order to demonstrate clinically significant improvement in LE strength    Goal status: INITIAL   3.  Pt will be able to demonstrate/report ability to walk >10 mins in order to demonstrate functional improvement and tolerance to exercise and community mobility.    Goal status: INITIAL   4.  Pt will score >/= 57 on FOTO to demonstrate improvement in perceived mobility and function.    Goal status: INITIAL        PLAN:   PT FREQUENCY: 1-2x/week   PT DURATION: 12 weeks   PLANNED INTERVENTIONS: Therapeutic exercises, Therapeutic activity, Neuromuscular re-education, Balance training, Gait training, Patient/Family education, Self Care, Joint mobilization, Joint manipulation, Stair training, Prosthetic training, DME instructions, Aquatic Therapy, Dry Needling, Electrical stimulation, Spinal manipulation, Spinal mobilization, Moist heat, scar mobilization, Splintting, Taping, Vasopneumatic device, Traction, Ultrasound, Ionotophoresis 4mg /ml Dexamethasone, Manual therapy, and Re-evaluation   PLAN FOR NEXT SESSION: LE exercise for transfers/transfer mechanics, consider resistance circuits, standing balance exercise, functional activity tolerance   Zebedee Iba PT, DPT 08/24/22 9:31 AM

## 2022-08-25 DIAGNOSIS — Z932 Ileostomy status: Secondary | ICD-10-CM | POA: Diagnosis not present

## 2022-08-31 ENCOUNTER — Encounter (HOSPITAL_BASED_OUTPATIENT_CLINIC_OR_DEPARTMENT_OTHER): Payer: Self-pay | Admitting: Physical Therapy

## 2022-08-31 ENCOUNTER — Ambulatory Visit (HOSPITAL_BASED_OUTPATIENT_CLINIC_OR_DEPARTMENT_OTHER): Payer: Medicare HMO | Admitting: Physical Therapy

## 2022-08-31 DIAGNOSIS — R262 Difficulty in walking, not elsewhere classified: Secondary | ICD-10-CM | POA: Diagnosis not present

## 2022-08-31 DIAGNOSIS — M6281 Muscle weakness (generalized): Secondary | ICD-10-CM

## 2022-08-31 NOTE — Therapy (Signed)
OUTPATIENT PHYSICAL THERAPY LOWER EXTREMITY TREATMENT   Patient Name: Nancy Harrison MRN: [id number] DOB:Sep 17, 1940, 82 y.o., female Today's Date: 08/31/2022  END OF SESSION:  PT End of Session - 08/31/22 1530     Visit Number 6    Number of Visits 18    Date for PT Re-Evaluation 11/09/22    Authorization Type [employer] MCR    Progress Note Due on Visit 10    PT Start Time 1530    PT Stop Time 1610    PT Time Calculation (min) 40 min    Equipment Utilized During Treatment Gait belt    Activity Tolerance Patient tolerated treatment well    Behavior During Therapy WFL for tasks assessed/performed                 Past Medical History:  Diagnosis Date   Arthritis    Barrett's esophagus    Path 10/2001   Breast cancer (HCC) 01/01/2011   Cancer (HCC) 1999   rt breast   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   Cataract    Family history of colon cancer    Gastrostomy in place [facility])    LUQ - placed 10/16/2021   Hypertension    Ileostomy, has currently (HCC)    placed 09/2021   SUI (stress urinary incontinence, female)    Thyroid disease    Urinary urgency    Past Surgical History:  Procedure Laterality Date   BREAST LUMPECTOMY  1999   Chemo and radiation on right breast   IR IMAGING GUIDED PORT INSERTION  11/11/2021   LAPAROSCOPY N/A 10/16/2021   Procedure: LAPAROSCOPIC  DIVERTING OSTOMY, BIOSPY,  G-TUBE PLACEMENT;  Surgeon: Karie Soda, MD;  Location: [facility];  Service: General;  Laterality: N/A;   TONSILLECTOMY  1957   TOTAL KNEE ARTHROPLASTY  1999 & 2001   Patient Active Problem List   Diagnosis Date Noted   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   Acute renal failure (HCC) 05/30/2022   Non-recurrent unilateral inguinal hernia without obstruction or gangrene 04/13/2022   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   Non-recurrent bilateral inguinal hernia without obstruction or gangrene 04/06/2022   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   Genetic testing 12/03/2021    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   Nausea & vomiting 11/06/2021   Hyponatremia 11/06/2021   Family history of colon cancer 11/05/2021   ARF (acute renal failure) (HCC) 11/05/2021   Irritant contact dermatitis associated with digestive stoma    Gastrostomy tube dysfunction (HCC)    Irritant contact dermatitis associated with fecal stoma    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 10/17/2021   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) 10/17/2021   Ileostomy care (HCC) 10/17/2021   Colonic mass 10/15/2021   History of breast cancer 12/30/2011   Leukopenia 04/09/2011   Anxiety state 04/06/2007   Esophageal reflux 04/06/2007   Essential hypertension 04/06/2007   Hyperlipidemia 04/06/2007   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 Mildly sore after last session with no adverse response.  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 Yes: NPRS scale: 1/10 Pain location: R hip Pain description: aching  Aggravating factors: "when I move my leg the wrong way it makes it hurt in my back"  (demonstrates marching) Relieving factors: not doing the motion that hurts   PRECAUTIONS: None  WEIGHT BEARING RESTRICTIONS: No  FALLS:  Has patient fallen in last 6 months? No  LIVING ENVIRONMENT: Lives  with: lives with their spouse Lives in: House/apartment, has basement  (usually goes down there 1x/month)  Stairs: yes , 6 steps to enter with step to pattern  Has following equipment at home: Single point cane, walker, shower grab bars   OCCUPATION: N/A Leisure: reading, computer games   PLOF: Independent with basic ADLs  PATIENT GOALS: getting her strength back,    OBJECTIVE:   DIAGNOSTIC FINDINGS: no relevant MSK imaging  PATIENT SURVEYS:  FOTO 41 52 @ DC 8 pts MCII  COGNITION: Overall cognitive status: Within functional limits for tasks assessed     SENSATION: WFL   POSTURE: rounded shoulders, forward head, and increased thoracic kyphosis   LOWER EXTREMITY ROM: WFL for tasks assessed; ileostomy limits hip flexion and ext ROM, stiffness noted with ER, IR, and knee ext due to HS length in seated  LOWER EXTREMITY MMT:  MMT Right eval Left eval  Hip flexion 4-/5 4/5  Hip extension    Hip abduction 4/5 4/5  Hip adduction 4/5 4/5  Hip internal rotation    Hip external rotation    Knee flexion 4-/5 4-/5  Knee extension 4/5 4/5  Ankle dorsiflexion    Ankle plantarflexion 4/5 4/5  Ankle inversion    Ankle eversion     (Blank rows = not tested)  FUNCTIONAL TESTS:  5 times sit to stand: 15.2s  Timed up and go (TUG): 14.3s  4 stage balance: passed NBOS, semi tandem, and tandem with R in rear, unable to perform SLS >2s  GAIT: Distance walked: 69ft Assistive device utilized: Single point cane Level of assistance: Complete Independence Comments: shortened step length, shuffling gait, low foot clearance   TODAY'S TREATMENT:                                                                                                                              DATE:   7/9   Nustep L4x8 minutes UE and LE Circuit: 8x STS, 15 HR, 5lb suitcase carry at rail 2x laps; 4 rounds; 45s-1 min rest between  4" box step ups 2x10 with single UE finger touch (last set 8x  without) Feet together with EC on airex 30s 3x Airex march 2x20 (no UE)  7/2   Nustep L4x8 minutes UE and LE  L stretch at rail- 20sec x3 (with R SB)  Sidestepping and retro walk at  rail YTB at knees 3x laps  Heel raises 2x10 STS YTB at knees 3x8 4" box step ups 2x10 Feet together with EC on airex 30s 3x Airex march 2x20 Gait with head turns down rail 3x laps at 70 BPM   08/19/22  TherEx  Nustep L4x6 minutes BLEs only L stretch at rail- 20sec x3 Heel raises 2x10 Partial squats 2x10 LAQs 2.5# 2x10 B Standing hip ABD 2.5# x10 B Gait training full hall x1 with SPC, x37ft without cane Gait with head turns/nods with SPC Hip hikes 2x15 for R hip drop (floor level) FT on airex x30sec FA with EC on airex x30s FT on airexwith head turns/nods x30s ea  08/16/22  TherEx  Nustep L4x6 minutes BLEs only L stretch at rail- 20sec x3 Heel raises 2x10 Partial squats 2x10 STS x10 Seated QL stretch 20sec x3 to R LAQs 2.5# 2x10 B Standing hip ABD 2.5# x10 B Gait training full hall x1  08/13/22  TherEx  Nustep L4x6 minutes BLEs only STS 6# x10 LAQs 2.5# x10 B Standing hip ABD 2.5# x10 B Gait training with 2.5# each LE, 95ft no device cues for foot clearance   NMR  Tandem stance solid surface 3x30 seconds B  SLS with one foot on 4 inch box 3x30 seconds B  Narrow BOS solid surface 2x30 seconds      PATIENT EDUCATION:  Education details: anatomy, exercise progression, DOMS expectations, envelope of function, HEP, POC  Person educated: Patient Education method: Explanation, Demonstration, Tactile cues, Verbal cues, and Handouts Education comprehension: verbalized understanding, returned demonstration, verbal cues required, and tactile cues required  HOME EXERCISE PROGRAM: Access Code: [email or web address] URL: [email or web address] Date: 08/11/2022 Prepared by: Zebedee Iba   ASSESSMENT:  CLINICAL IMPRESSION:  Pt able to tolerate circuit type exercise at  today's session with regular rest intervals. RPE kept at 5 at highest during session. Pt did have expected shortness of breath and exercise induced fatigue. Pt able to return to baseline within 45s-28min seated rest breaks. Pt able to perform step up exercise today with better foot clearance and less UE assist. Pt is progressing well with increased intensity and volume of loading. Plan to continue with general capacity, endurance, and strength as tolerated. No pain or discomfort noted during session. Pt will benefit from continue PT to work on strengthening and improve functional capacity.    OBJECTIVE IMPAIRMENTS: Abnormal gait, decreased activity tolerance, decreased balance, decreased endurance, decreased knowledge of use of DME, decreased mobility, difficulty walking, decreased ROM, decreased strength, hypomobility, impaired flexibility, improper body mechanics, postural dysfunction, and pain.    ACTIVITY LIMITATIONS: carrying, lifting, bending, standing, squatting, stairs, transfers, bed mobility, and locomotion level   PARTICIPATION LIMITATIONS: meal prep, cleaning, laundry, driving, shopping, and community activity and exercise   PERSONAL FACTORS: Age, Fitness, Time since onset of injury/illness/exacerbation, and 1-2 comorbidities:    are also affecting patient's functional outcome.    REHAB POTENTIAL: Fair     CLINICAL DECISION MAKING: Evolving/moderate complexity   EVALUATION COMPLEXITY: Moderate     GOALS:     SHORT TERM GOALS: Target date: 09/22/2022    Pt will become independent with HEP in order to demonstrate synthesis of PT education.    Goal status: INITIAL   2.  Pt will be able to perform 5XSTS in under 12s  in order to demonstrate functional improvement above the cut off score for adults.   Goal status: INITIAL   3.  Pt will be able to demonstrate ability to perform gait  with upright posture and AD  in order to demonstrate functional improvement in LE function for safety  with community ambulation    Goal status: INITIAL       LONG TERM GOALS: Target date: 11/03/2022      Pt will be able to demonstrate TUG in under 10 sec in order to demonstrate functional improvement in LE function, strength, balance, and mobility for safety with community ambulation.   Goal status: INITIAL   Pt will decrease 5XSTS by at least 3 seconds in order to demonstrate clinically significant improvement in LE strength    Goal status: INITIAL   3.  Pt will be able to demonstrate/report ability to walk >10 mins in order to demonstrate functional improvement and tolerance to exercise and community mobility.    Goal status: INITIAL   4.  Pt will score >/= 57 on FOTO to demonstrate improvement in perceived mobility and function.    Goal status: INITIAL        PLAN:   PT FREQUENCY: 1-2x/week   PT DURATION: 12 weeks   PLANNED INTERVENTIONS: Therapeutic exercises, Therapeutic activity, Neuromuscular re-education, Balance training, Gait training, Patient/Family education, Self Care, Joint mobilization, Joint manipulation, Stair training, Prosthetic training, DME instructions, Aquatic Therapy, Dry Needling, Electrical stimulation, Spinal manipulation, Spinal mobilization, Moist heat, scar mobilization, Splintting, Taping, Vasopneumatic device, Traction, Ultrasound, Ionotophoresis 4mg /ml Dexamethasone, Manual therapy, and Re-evaluation   PLAN FOR NEXT SESSION: LE exercise for transfers/transfer mechanics, consider resistance circuits, standing balance exercise, functional activity tolerance   Zebedee Iba PT, DPT 08/31/22 5:49 PM

## 2022-09-02 ENCOUNTER — Ambulatory Visit (HOSPITAL_BASED_OUTPATIENT_CLINIC_OR_DEPARTMENT_OTHER): Payer: Medicare HMO | Admitting: Physical Therapy

## 2022-09-02 ENCOUNTER — Other Ambulatory Visit: Payer: Self-pay | Admitting: *Deleted

## 2022-09-02 ENCOUNTER — Encounter: Payer: Self-pay | Admitting: *Deleted

## 2022-09-02 ENCOUNTER — Inpatient Hospital Stay: Payer: Medicare HMO | Admitting: Nurse Practitioner

## 2022-09-02 ENCOUNTER — Inpatient Hospital Stay: Payer: Medicare HMO | Attending: Oncology

## 2022-09-02 ENCOUNTER — Encounter (HOSPITAL_BASED_OUTPATIENT_CLINIC_OR_DEPARTMENT_OTHER): Payer: Self-pay | Admitting: Physical Therapy

## 2022-09-02 ENCOUNTER — Encounter: Payer: Self-pay | Admitting: Nurse Practitioner

## 2022-09-02 ENCOUNTER — Inpatient Hospital Stay: Payer: Medicare HMO

## 2022-09-02 VITALS — BP 146/83 | HR 81

## 2022-09-02 DIAGNOSIS — C481 Malignant neoplasm of specified parts of peritoneum: Secondary | ICD-10-CM | POA: Diagnosis not present

## 2022-09-02 DIAGNOSIS — Z5112 Encounter for antineoplastic immunotherapy: Secondary | ICD-10-CM | POA: Insufficient documentation

## 2022-09-02 DIAGNOSIS — R262 Difficulty in walking, not elsewhere classified: Secondary | ICD-10-CM | POA: Diagnosis not present

## 2022-09-02 DIAGNOSIS — M6281 Muscle weakness (generalized): Secondary | ICD-10-CM | POA: Diagnosis not present

## 2022-09-02 LAB — CMP (CANCER CENTER ONLY)
ALT: 18 U/L (ref 0–44)
AST: 23 U/L (ref 15–41)
Albumin: 4.3 g/dL (ref 3.5–5.0)
Alkaline Phosphatase: 44 U/L (ref 38–126)
Anion gap: 8 (ref 5–15)
BUN: 25 mg/dL — ABNORMAL HIGH (ref 8–23)
CO2: 26 mmol/L (ref 22–32)
Calcium: 10 mg/dL (ref 8.9–10.3)
Chloride: 107 mmol/L (ref 98–111)
Creatinine: 0.81 mg/dL (ref 0.44–1.00)
GFR, Estimated: >60 mL/min (ref >60)
Glucose, Bld: 108 mg/dL — ABNORMAL HIGH (ref 70–99)
Potassium: 3.8 mmol/L (ref 3.5–5.1)
Sodium: 141 mmol/L (ref 135–145)
Total Bilirubin: 0.5 mg/dL (ref 0.3–1.2)
Total Protein: 6.8 g/dL (ref 6.5–8.1)

## 2022-09-02 LAB — CBC WITH DIFFERENTIAL (CANCER CENTER ONLY)
Abs Immature Granulocytes: 0.01 10*3/uL (ref 0.00–0.07)
Basophils Absolute: 0.1 10*3/uL (ref 0.0–0.1)
Basophils Relative: 1 %
Eosinophils Absolute: 0.3 10*3/uL (ref 0.0–0.5)
Eosinophils Relative: 7 %
HCT: 37.5 % (ref 36.0–46.0)
Hemoglobin: 12.2 g/dL (ref 12.0–15.0)
Immature Granulocytes: 0 %
Lymphocytes Relative: 27 %
Lymphs Abs: 1.1 10*3/uL (ref 0.7–4.0)
MCH: 34.5 pg — ABNORMAL HIGH (ref 26.0–34.0)
MCHC: 32.5 g/dL (ref 30.0–36.0)
MCV: 105.9 fL — ABNORMAL HIGH (ref 80.0–100.0)
Monocytes Absolute: 0.4 10*3/uL (ref 0.1–1.0)
Monocytes Relative: 9 %
Neutro Abs: 2.3 10*3/uL (ref 1.7–7.7)
Neutrophils Relative %: 56 %
Platelet Count: 198 10*3/uL (ref 150–400)
RBC: 3.54 MIL/uL — ABNORMAL LOW (ref 3.87–5.11)
RDW: 13.4 % (ref 11.5–15.5)
WBC Count: 4.2 10*3/uL (ref 4.0–10.5)
nRBC: 0 % (ref 0.0–0.2)

## 2022-09-02 LAB — TOTAL PROTEIN, URINE DIPSTICK: Protein, ur: NEGATIVE mg/dL

## 2022-09-02 MED ORDER — SODIUM CHLORIDE 0.9 % IV SOLN
15.0000 mg/kg | Freq: Once | INTRAVENOUS | Status: AC
  Administered 2022-09-02: 800 mg via INTRAVENOUS
  Filled 2022-09-02: qty 32

## 2022-09-02 MED ORDER — SODIUM CHLORIDE 0.9% FLUSH
10.0000 mL | INTRAVENOUS | Status: DC | PRN
  Administered 2022-09-02: 10 mL

## 2022-09-02 MED ORDER — SODIUM CHLORIDE 0.9 % IV SOLN: INTRAVENOUS | Status: DC

## 2022-09-02 MED ORDER — HEPARIN SOD (PORK) LOCK FLUSH 100 UNIT/ML IV SOLN
500.0000 [IU] | Freq: Once | INTRAVENOUS | Status: AC | PRN
  Administered 2022-09-02: 500 [IU]

## 2022-09-02 NOTE — Patient Instructions (Signed)
[facility]   Discharge Instructions: Thank you for choosing [facility] to provide your oncology and hematology care.   If you have a lab appointment with the Cancer Center, please go directly to the Cancer Center and check in at the registration area.   Wear comfortable clothing and clothing appropriate for easy access to any Portacath or PICC line.   We strive to give you quality time with your provider. You may need to reschedule your appointment if you arrive late (15 or more minutes).  Arriving late affects you and other patients whose appointments are after yours.  Also, if you miss three or more appointments without notifying the office, you may be dismissed from the clinic at the provider's discretion.      For prescription refill requests, have your pharmacy contact our office and allow 72 hours for refills to be completed.    Today you received the following chemotherapy and/or immunotherapy agents Avastin.      To help prevent nausea and vomiting after your treatment, we encourage you to take your nausea medication as directed.  BELOW ARE SYMPTOMS THAT SHOULD BE REPORTED IMMEDIATELY: *FEVER GREATER THAN 100.4 F (38 C) OR HIGHER *CHILLS OR SWEATING *NAUSEA AND VOMITING THAT IS NOT CONTROLLED WITH YOUR NAUSEA MEDICATION *UNUSUAL SHORTNESS OF BREATH *UNUSUAL BRUISING OR BLEEDING *URINARY PROBLEMS (pain or burning when urinating, or frequent urination) *BOWEL PROBLEMS (unusual diarrhea, constipation, pain near the anus) TENDERNESS IN MOUTH AND THROAT WITH OR WITHOUT PRESENCE OF ULCERS (sore throat, sores in mouth, or a toothache) UNUSUAL RASH, SWELLING OR PAIN  UNUSUAL VAGINAL DISCHARGE OR ITCHING   Items with * indicate a potential emergency and should be followed up as soon as possible or go to the Emergency Department if any problems should occur.  Please show the CHEMOTHERAPY ALERT CARD or IMMUNOTHERAPY ALERT CARD at  check-in to the Emergency Department and triage nurse.  Should you have questions after your visit or need to cancel or reschedule your appointment, please contact [facility]  Dept: [phone]  and follow the prompts.  Office hours are 8:00 a.m. to 4:30 p.m. Monday - Friday. Please note that voicemails left after 4:00 p.m. may not be returned until the following business day.  We are closed weekends and major holidays. You have access to a nurse at all times for urgent questions. Please call the main number to the clinic Dept: [phone] and follow the prompts.   For any non-urgent questions, you may also contact your provider using MyChart. We now offer e-Visits for anyone 71 and older to request care online for non-urgent symptoms. For details visit [email or web address].   Also download the MyChart app! Go to the app store, search "MyChart", open the app, select [facility], and log in with your MyChart username and password.  Bevacizumab Injection What is this medication? BEVACIZUMAB (be va SIZ yoo mab) treats some types of cancer. It works by blocking a protein that causes cancer cells to grow and multiply. This helps to slow or stop the spread of cancer cells. It is a monoclonal antibody. This medicine may be used for other purposes; ask your health care provider or pharmacist if you have questions. COMMON BRAND NAME(S): Alymsys, Avastin, MVASI, Omer Jack What should I tell my care team before I take this medication? They need to know if you have any of these conditions: Blood clots Coughing up blood Having or recent surgery Heart failure  High blood pressure History of a connection between 2 or more body parts that do not usually connect (fistula) History of a tear in your stomach or intestines Protein in your urine An unusual or allergic reaction to bevacizumab, other medications, foods, dyes, or preservatives Pregnant or trying to get  pregnant Breast-feeding How should I use this medication? This medication is injected into a vein. It is given by your care team in a hospital or clinic setting. Talk to your care team the use of this medication in children. Special care may be needed. Overdosage: If you think you have taken too much of this medicine contact a poison control center or emergency room at once. NOTE: This medicine is only for you. Do not share this medicine with others. What if I miss a dose? Keep appointments for follow-up doses. It is important not to miss your dose. Call your care team if you are unable to keep an appointment. What may interact with this medication? Interactions are not expected. This list may not describe all possible interactions. Give your health care provider a list of all the medicines, herbs, non-prescription drugs, or dietary supplements you use. Also tell them if you smoke, drink alcohol, or use illegal drugs. Some items may interact with your medicine. What should I watch for while using this medication? Your condition will be monitored carefully while you are receiving this medication. You may need blood work while taking this medication. This medication may make you feel generally unwell. This is not uncommon as chemotherapy can affect healthy cells as well as cancer cells. Report any side effects. Continue your course of treatment even though you feel ill unless your care team tells you to stop. This medication may increase your risk to bruise or bleed. Call your care team if you notice any unusual bleeding. Before having surgery, talk to your care team to make sure it is ok. This medication can increase the risk of poor healing of your surgical site or wound. You will need to stop this medication for 28 days before surgery. After surgery, wait at least 28 days before restarting this medication. Make sure the surgical site or wound is healed enough before restarting this medication. Talk  to your care team if questions. Talk to your care team if you may be pregnant. Serious birth defects can occur if you take this medication during pregnancy and for 6 months after the last dose. Contraception is recommended while taking this medication and for 6 months after the last dose. Your care team can help you find the option that works for you. Do not breastfeed while taking this medication and for 6 months after the last dose. This medication can cause infertility. Talk to your care team if you are concerned about your fertility. What side effects may I notice from receiving this medication? Side effects that you should report to your care team as soon as possible: Allergic reactions--skin rash, itching, hives, swelling of the face, lips, tongue, or throat Bleeding--bloody or black, tar-like stools, vomiting blood or brown material that looks like coffee grounds, red or dark brown urine, small red or purple spots on skin, unusual bruising or bleeding Blood clot--pain, swelling, or warmth in the leg, shortness of breath, chest pain Heart attack--pain or tightness in the chest, shoulders, arms, or jaw, nausea, shortness of breath, cold or clammy skin, feeling faint or lightheaded Heart failure--shortness of breath, swelling of the ankles, feet, or hands, sudden weight gain, unusual weakness or fatigue  Increase in blood pressure Infection--fever, chills, cough, sore throat, wounds that don't heal, pain or trouble when passing urine, general feeling of discomfort or being unwell Infusion reactions--chest pain, shortness of breath or trouble breathing, feeling faint or lightheaded Kidney injury--decrease in the amount of urine, swelling of the ankles, hands, or feet Stomach pain that is severe, does not go away, or gets worse Stroke--sudden numbness or weakness of the face, arm, or leg, trouble speaking, confusion, trouble walking, loss of balance or coordination, dizziness, severe headache,  change in vision Sudden and severe headache, confusion, change in vision, seizures, which may be signs of posterior reversible encephalopathy syndrome (PRES) Side effects that usually do not require medical attention (report to your care team if they continue or are bothersome): Back pain Change in taste Diarrhea Dry skin Increased tears Nosebleed This list may not describe all possible side effects. Call your doctor for medical advice about side effects. You may report side effects to FDA at 1-800-FDA-1088. Where should I keep my medication? This medication is given in a hospital or clinic. It will not be stored at home. NOTE: This sheet is a summary. It may not cover all possible information. If you have questions about this medicine, talk to your doctor, pharmacist, or health care provider.  2024 Elsevier/Gold Standard (2021-06-26 00:00:00)

## 2022-09-02 NOTE — Progress Notes (Signed)
[facility] OFFICE PROGRESS NOTE   Diagnosis: Peritoneal carcinoma  INTERVAL HISTORY:   Nancy Harrison returns as scheduled.  She continues olaparib.  She was last treated with bevacizumab 08/13/2022.  She has had more loose stools the past few days, 3 or 4.  Typically she has 2.  No nausea or vomiting.  Stable groin rash.  No new rash.  No cough or shortness of breath.  Objective:  Vital signs in last 24 hours:  Blood pressure (!) 160/74, pulse 85, temperature 98.1 F (36.7 C), temperature source Temporal, resp. rate 18, weight 116 lb 12.8 oz (53 kg), SpO2 98%.    HEENT: No thrush or ulcers. Resp: Lungs clear bilaterally. Cardio: Regular rate and rhythm. GI: Abdomen soft and nontender.  No hepatosplenomegaly.  Right abdomen ileostomy. Vascular: Trace bilateral lower ankle edema. Skin: No significant rash in the groin regions. Port-A-Cath without erythema.  Lab Results:  Lab Results  Component Value Date   WBC 4.2 09/02/2022   HGB 12.2 09/02/2022   HCT 37.5 09/02/2022   MCV 105.9 (H) 09/02/2022   PLT 198 09/02/2022   NEUTROABS 2.3 09/02/2022    Imaging:  No results found.  Medications: I have reviewed the patient's current medications.  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  Bone lesions concerning for metastases, right lung nodule Laparoscopy, diverting loop ileostomy, gastrostomy tube placement, peritoneal biopsy 10/16/2021, no evidence of a primary tumor site at the appendix, ovaries, or uterus.  Diffuse peritoneal carcinomatosis Pathology of the lesser curvature of stomach carcinomatosis biopsy-metastatic poorly differentiated carcinoma, CK7, PAX8, WT1, and  p53 positive with focal labeling for p16.  CDX2, CK20, and GATA3 negative.  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 Foundation 1-MSS, tumor mutation burden 5, HRD positive-LOH score 34.1% 08/11/2021 CA125 818 CT chest 11/03/2021-10 mm right lower lobe nodule, scattered tiny pulmonary nodules, peritoneal carcinomatosis Cycle 1 Taxol/carboplatin 11/12/2021 Cycle 2 Taxol/carboplatin 12/03/2021 Cycle 3 Taxol/carboplatin 12/24/2021 Bone scan 01/05/2022-negative for metastatic disease Cycle 4 Taxol/carboplatin 01/13/2022 CTs 01/22/2022-reduction in peritoneal carcinomatosis, no evidence of progressive disease stable bilateral lower lobe pulmonary nodules Cycle 5 Taxol/carboplatin 02/03/2022 Cycle 6 Taxol/carboplatin 02/24/2022 CT abdomen/pelvis 04/19/2022-slight decrease in peritoneal carcinomatosis with 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   Abdominal distention/pain and diarrhea secondary to #1 3.   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  Lumpectomy and adjuvant CMF x8 followed by radiation 4. Hypertension 5. Hypothyroidism 6. Barrett's esophagus 7. Family history of multiple cancers including appendix, colon, and lung cancer 8.  Hospital admission 11/05/2021 with dehydration/prerenal azotemia secondary to nausea and high output ileostomy 9.  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 Admission 05/29/2022 with dehydration and acute renal injury      Disposition: Nancy Harrison appears stable.  She is currently being treated with olaparib and bevacizumab.  She has noted a mild increase in loose stools for the past few days.  She will continue  Imodium/Lomotil as needed and understands to contact the office if the diarrhea is not controlled.  Plan to  proceed with bevacizumab today as scheduled.  CTs prior to next office visit.  CBC and chemistry panel reviewed.  Labs adequate to proceed as above.  She will return for follow-up as scheduled in 3 weeks.  She will contact the office in the interim as outlined above or with any other problems.  Lonna Cobb ANP/GNP-BC   09/02/2022  8:54 AM

## 2022-09-02 NOTE — Therapy (Signed)
OUTPATIENT PHYSICAL THERAPY LOWER EXTREMITY TREATMENT   Patient Name: Nancy Harrison MRN: [id number] DOB:Jul 12, 1940, 82 y.o., female Today's Date: 09/02/2022  END OF SESSION:  PT End of Session - 09/02/22 1407     Visit Number 7    Number of Visits 18    Date for PT Re-Evaluation 11/09/22    Authorization Type [employer] MCR    Progress Note Due on Visit 10    PT Start Time 1404    PT Stop Time 1442    PT Time Calculation (min) 38 min    Equipment Utilized During Treatment Gait belt    Activity Tolerance Patient tolerated treatment well    Behavior During Therapy WFL for tasks assessed/performed                 Past Medical History:  Diagnosis Date   Arthritis    Barrett's esophagus    Path 10/2001   Breast cancer (HCC) 01/01/2011   Cancer (HCC) 1999   rt breast   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   Cataract    Family history of colon cancer    Gastrostomy in place [facility])    LUQ - placed 10/16/2021   Hypertension    Ileostomy, has currently (HCC)    placed 09/2021   SUI (stress urinary incontinence, female)    Thyroid disease    Urinary urgency    Past Surgical History:  Procedure Laterality Date   BREAST LUMPECTOMY  1999   Chemo and radiation on right breast   IR IMAGING GUIDED PORT INSERTION  11/11/2021   LAPAROSCOPY N/A 10/16/2021   Procedure: LAPAROSCOPIC  DIVERTING OSTOMY, BIOSPY,  G-TUBE PLACEMENT;  Surgeon: Karie Soda, MD;  Location: [facility];  Service: General;  Laterality: N/A;   TONSILLECTOMY  1957   TOTAL KNEE ARTHROPLASTY  1999 & 2001   Patient Active Problem List   Diagnosis Date Noted   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   Acute renal failure (HCC) 05/30/2022   Non-recurrent unilateral inguinal hernia without obstruction or gangrene 04/13/2022   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   Non-recurrent bilateral inguinal hernia without obstruction or gangrene 04/06/2022   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   Genetic testing 12/03/2021    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   Nausea & vomiting 11/06/2021   Hyponatremia 11/06/2021   Family history of colon cancer 11/05/2021   ARF (acute renal failure) (HCC) 11/05/2021   Irritant contact dermatitis associated with digestive stoma    Gastrostomy tube dysfunction (HCC)    Irritant contact dermatitis associated with fecal stoma    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 10/17/2021   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) 10/17/2021   Ileostomy care (HCC) 10/17/2021   Colonic mass 10/15/2021   History of breast cancer 12/30/2011   Leukopenia 04/09/2011   Anxiety state 04/06/2007   Esophageal reflux 04/06/2007   Essential hypertension 04/06/2007   Hyperlipidemia 04/06/2007   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 Pt notes no pain today. R hip pain goes away if she stands up straight.    PERTINENT HISTORY: Barrett's esophagus,  breast ca, HTN, thyroid disease, stage IV extraovarian primary peritoneal carcinoma, laparascopic loop ileostomy 09/2021  Has had PT in the past for balance/falls   PAIN:  Are you having pain? No: NPRS scale: 0/10 Pain location: R hip Pain description: aching  Aggravating factors: "when I move my leg the wrong way it makes it hurt in my back"  (demonstrates marching) Relieving factors: not doing the motion that hurts   PRECAUTIONS: None  WEIGHT BEARING RESTRICTIONS: No  FALLS:  Has patient fallen in last 6 months? No  LIVING ENVIRONMENT: Lives with: lives with their spouse Lives in:  House/apartment, has basement  (usually goes down there 1x/month)  Stairs: yes , 6 steps to enter with step to pattern  Has following equipment at home: Single point cane, walker, shower grab bars   OCCUPATION: N/A Leisure: reading, computer games   PLOF: Independent with basic ADLs  PATIENT GOALS: getting her strength back,    OBJECTIVE:   DIAGNOSTIC FINDINGS: no relevant MSK imaging  PATIENT SURVEYS:  FOTO 41 52 @ DC 8 pts MCII  COGNITION: Overall cognitive status: Within functional limits for tasks assessed     SENSATION: WFL   POSTURE: rounded shoulders, forward head, and increased thoracic kyphosis   LOWER EXTREMITY ROM: WFL for tasks assessed; ileostomy limits hip flexion and ext ROM, stiffness noted with ER, IR, and knee ext due to HS length in seated  LOWER EXTREMITY MMT:  MMT Right eval Left eval  Hip flexion 4-/5 4/5  Hip extension    Hip abduction 4/5 4/5  Hip adduction 4/5 4/5  Hip internal rotation    Hip external rotation    Knee flexion 4-/5 4-/5  Knee extension 4/5 4/5  Ankle dorsiflexion    Ankle plantarflexion 4/5 4/5  Ankle inversion    Ankle eversion     (Blank rows = not tested)  FUNCTIONAL TESTS:  5 times sit to stand: 15.2s  Timed up and go (TUG): 14.3s  4 stage balance: passed NBOS, semi tandem, and tandem with R in rear, unable to perform SLS >2s  GAIT: Distance walked: 88ft Assistive device utilized: Single point cane Level of assistance: Complete Independence Comments: shortened step length, shuffling gait, low foot clearance   TODAY'S TREATMENT:                                                                                                                              DATE:   7/11  Nustep L4x8 minutes UE and LE  Circuit: 8x STS, 1x lap sidestep, 5lb suitcase carry at rail 1x laps; 4 rounds; 45s-1 min rest between (RPE 5-6)   6" box step up taps 3x 20s  (single UE) Feet together with EC on airex beam 30s 3x Tandem  reach outside BOS 10x each stance position (at rail) SBA  7/9   Nustep L4x8 minutes UE and LE Circuit: 8x STS, 15 HR, 5lb suitcase carry at rail 2x laps; 4  rounds; 45s-1 min rest between  4" box step ups 2x10 with single UE finger touch (last set 8x without) Feet together with EC on airex 30s 3x Airex march 2x20 (no UE)  7/2   Nustep L4x8 minutes UE and LE  L stretch at rail- 20sec x3 (with R SB)  Sidestepping and retro walk at rail YTB at knees 3x laps  Heel raises 2x10 STS YTB at knees 3x8 4" box step ups 2x10 Feet together with EC on airex 30s 3x Airex march 2x20 Gait with head turns down rail 3x laps at 70 BPM   08/19/22  TherEx  Nustep L4x6 minutes BLEs only L stretch at rail- 20sec x3 Heel raises 2x10 Partial squats 2x10 LAQs 2.5# 2x10 B Standing hip ABD 2.5# x10 B Gait training full hall x1 with SPC, x64ft without cane Gait with head turns/nods with SPC Hip hikes 2x15 for R hip drop (floor level) FT on airex x30sec FA with EC on airex x30s FT on airexwith head turns/nods x30s ea  08/16/22  TherEx  Nustep L4x6 minutes BLEs only L stretch at rail- 20sec x3 Heel raises 2x10 Partial squats 2x10 STS x10 Seated QL stretch 20sec x3 to R LAQs 2.5# 2x10 B Standing hip ABD 2.5# x10 B Gait training full hall x1  08/13/22  TherEx  Nustep L4x6 minutes BLEs only STS 6# x10 LAQs 2.5# x10 B Standing hip ABD 2.5# x10 B Gait training with 2.5# each LE, 48ft no device cues for foot clearance   NMR  Tandem stance solid surface 3x30 seconds B  SLS with one foot on 4 inch box 3x30 seconds B  Narrow BOS solid surface 2x30 seconds      PATIENT EDUCATION:  Education details: anatomy, exercise progression, DOMS expectations, envelope of function, HEP, POC  Person educated: Patient Education method: Explanation, Demonstration, Tactile cues, Verbal cues, and Handouts Education comprehension: verbalized understanding, returned demonstration, verbal cues  required, and tactile cues required  HOME EXERCISE PROGRAM: Access Code: [email or web address] URL: [email or web address] Date: 08/11/2022 Prepared by: Zebedee Iba   ASSESSMENT:  CLINICAL IMPRESSION: Pt able to progress intensity of circuits today to RPE 5-6 without pain or SOB. Pt also able to increased difficulty of balance exercise to include mechanics of head-hips relationship when reaching outside of BOS. Pt continues to make progress each session. Consider reduction of UE assist at future sessions. Plan to continue with general capacity, endurance, and strength as tolerated. No pain or discomfort noted during session. Pt will benefit from continue PT to work on strengthening and improve functional capacity.    OBJECTIVE IMPAIRMENTS: Abnormal gait, decreased activity tolerance, decreased balance, decreased endurance, decreased knowledge of use of DME, decreased mobility, difficulty walking, decreased ROM, decreased strength, hypomobility, impaired flexibility, improper body mechanics, postural dysfunction, and pain.    ACTIVITY LIMITATIONS: carrying, lifting, bending, standing, squatting, stairs, transfers, bed mobility, and locomotion level   PARTICIPATION LIMITATIONS: meal prep, cleaning, laundry, driving, shopping, and community activity and exercise   PERSONAL FACTORS: Age, Fitness, Time since onset of injury/illness/exacerbation, and 1-2 comorbidities:    are also affecting patient's functional outcome.    REHAB POTENTIAL: Fair     CLINICAL DECISION MAKING: Evolving/moderate complexity   EVALUATION COMPLEXITY: Moderate     GOALS:     SHORT TERM GOALS: Target date: 09/22/2022    Pt will become independent with HEP in order to demonstrate synthesis of PT education.    Goal status: INITIAL   2.  Pt will be able to perform  5XSTS in under 12s  in order to demonstrate functional improvement above the cut off score for adults.   Goal status: INITIAL   3.  Pt will be able to  demonstrate ability to perform gait with upright posture and AD  in order to demonstrate functional improvement in LE function for safety with community ambulation    Goal status: INITIAL       LONG TERM GOALS: Target date: 11/03/2022      Pt will be able to demonstrate TUG in under 10 sec in order to demonstrate functional improvement in LE function, strength, balance, and mobility for safety with community ambulation.   Goal status: INITIAL   Pt will decrease 5XSTS by at least 3 seconds in order to demonstrate clinically significant improvement in LE strength    Goal status: INITIAL   3.  Pt will be able to demonstrate/report ability to walk >10 mins in order to demonstrate functional improvement and tolerance to exercise and community mobility.    Goal status: INITIAL   4.  Pt will score >/= 57 on FOTO to demonstrate improvement in perceived mobility and function.    Goal status: INITIAL        PLAN:   PT FREQUENCY: 1-2x/week   PT DURATION: 12 weeks   PLANNED INTERVENTIONS: Therapeutic exercises, Therapeutic activity, Neuromuscular re-education, Balance training, Gait training, Patient/Family education, Self Care, Joint mobilization, Joint manipulation, Stair training, Prosthetic training, DME instructions, Aquatic Therapy, Dry Needling, Electrical stimulation, Spinal manipulation, Spinal mobilization, Moist heat, scar mobilization, Splintting, Taping, Vasopneumatic device, Traction, Ultrasound, Ionotophoresis 4mg /ml Dexamethasone, Manual therapy, and Re-evaluation   PLAN FOR NEXT SESSION: LE exercise for transfers/transfer mechanics, consider resistance circuits, standing balance exercise, functional activity tolerance   Zebedee Iba PT, DPT 09/02/22 2:51 PM

## 2022-09-02 NOTE — Progress Notes (Signed)
Patient seen by Lisa Thomas NP today  Vitals are within treatment parameters.  Labs reviewed by Lisa Thomas NP and are within treatment parameters.  Per physician team, patient is ready for treatment and there are NO modifications to the treatment plan.     

## 2022-09-02 NOTE — Progress Notes (Signed)
PATIENT NAVIGATOR PROGRESS NOTE  Name: Nancy Harrison Date: 09/02/2022 MRN: [id number]  DOB: [birth year]   Reason for visit:  Change in skin on ostomy  Comments:  During visit with Lonna Cobb NP, [name] had question regarding skin mid way on stoma that looked different. She reports normal output and no skin issues other than one area about 1/3 inch mid way up on stoma that is a "natural skin color" not the bright red as rest of stoma.  Inbasket message sent to ostomy nurse and she responded that "that can be epithelial tissue over the mucosal tissue and it can be normal, if there are no issues with stoma function, pouching system and the area is not bleeding it should be okay"  Message relayed to [name] and all questions answered to her satisfaction.      Time spent counseling/coordinating care: 45-60 minutes

## 2022-09-04 LAB — CA 125: Cancer Antigen (CA) 125: 19.5 U/mL (ref 0.0–38.1)

## 2022-09-05 ENCOUNTER — Other Ambulatory Visit: Payer: Self-pay | Admitting: Oncology

## 2022-09-06 ENCOUNTER — Encounter: Payer: Self-pay | Admitting: Oncology

## 2022-09-06 ENCOUNTER — Inpatient Hospital Stay: Payer: Medicare HMO

## 2022-09-06 ENCOUNTER — Ambulatory Visit (HOSPITAL_BASED_OUTPATIENT_CLINIC_OR_DEPARTMENT_OTHER)
Admission: RE | Admit: 2022-09-06 | Discharge: 2022-09-06 | Disposition: A | Discharge status: Home / Self Care | Payer: Medicare HMO | Source: Ambulatory Visit | Attending: Oncology | Admitting: Oncology

## 2022-09-06 DIAGNOSIS — C482 Malignant neoplasm of peritoneum, unspecified: Secondary | ICD-10-CM | POA: Diagnosis not present

## 2022-09-06 DIAGNOSIS — C481 Malignant neoplasm of specified parts of peritoneum: Secondary | ICD-10-CM | POA: Diagnosis not present

## 2022-09-06 MED ORDER — IOHEXOL 300 MG/ML  SOLN
100.0000 mL | Freq: Once | INTRAMUSCULAR | Status: AC | PRN
  Administered 2022-09-06: 75 mL via INTRAVENOUS

## 2022-09-06 NOTE — Patient Instructions (Signed)

## 2022-09-07 ENCOUNTER — Ambulatory Visit (HOSPITAL_BASED_OUTPATIENT_CLINIC_OR_DEPARTMENT_OTHER): Payer: Medicare HMO

## 2022-09-07 ENCOUNTER — Encounter (HOSPITAL_BASED_OUTPATIENT_CLINIC_OR_DEPARTMENT_OTHER): Payer: Self-pay

## 2022-09-07 DIAGNOSIS — M6281 Muscle weakness (generalized): Secondary | ICD-10-CM | POA: Diagnosis not present

## 2022-09-07 DIAGNOSIS — R262 Difficulty in walking, not elsewhere classified: Secondary | ICD-10-CM | POA: Diagnosis not present

## 2022-09-07 NOTE — Therapy (Signed)
OUTPATIENT PHYSICAL THERAPY LOWER EXTREMITY TREATMENT   Patient Name: Binnie Droessler MRN: [id number] DOB:1940/03/21, 82 y.o., female Today's Date: 09/07/2022  END OF SESSION:  PT End of Session - 09/07/22 1455     Visit Number 8    Number of Visits 18    Date for PT Re-Evaluation 11/09/22    Authorization Type [employer] MCR    Progress Note Due on Visit 10    PT Start Time 1435    PT Stop Time 1515    PT Time Calculation (min) 40 min    Equipment Utilized During Treatment Gait belt    Activity Tolerance Patient tolerated treatment well    Behavior During Therapy WFL for tasks assessed/performed                  Past Medical History:  Diagnosis Date   Arthritis    Barrett's esophagus    Path 10/2001   Breast cancer (HCC) 01/01/2011   Cancer (HCC) 1999   rt breast   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   Cataract    Family history of colon cancer    Gastrostomy in place [facility])    LUQ - placed 10/16/2021   Hypertension    Ileostomy, has currently (HCC)    placed 09/2021   SUI (stress urinary incontinence, female)    Thyroid disease    Urinary urgency    Past Surgical History:  Procedure Laterality Date   BREAST LUMPECTOMY  1999   Chemo and radiation on right breast   IR IMAGING GUIDED PORT INSERTION  11/11/2021   LAPAROSCOPY N/A 10/16/2021   Procedure: LAPAROSCOPIC  DIVERTING OSTOMY, BIOSPY,  G-TUBE PLACEMENT;  Surgeon: Karie Soda, MD;  Location: [facility];  Service: General;  Laterality: N/A;   TONSILLECTOMY  1957   TOTAL KNEE ARTHROPLASTY  1999 & 2001   Patient Active Problem List   Diagnosis Date Noted   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   Acute renal failure (HCC) 05/30/2022   Non-recurrent unilateral inguinal hernia without obstruction or gangrene 04/13/2022   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   Non-recurrent bilateral inguinal hernia without obstruction or gangrene 04/06/2022   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   Genetic testing  12/03/2021   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   Nausea & vomiting 11/06/2021   Hyponatremia 11/06/2021   Family history of colon cancer 11/05/2021   ARF (acute renal failure) (HCC) 11/05/2021   Irritant contact dermatitis associated with digestive stoma    Gastrostomy tube dysfunction (HCC)    Irritant contact dermatitis associated with fecal stoma    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 10/17/2021   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) 10/17/2021   Ileostomy care (HCC) 10/17/2021   Colonic mass 10/15/2021   History of breast cancer 12/30/2011   Leukopenia 04/09/2011   Anxiety state 04/06/2007   Esophageal reflux 04/06/2007   Essential hypertension 04/06/2007   Hyperlipidemia 04/06/2007   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 Pt notes no pain today. R hip pain goes away if she stands up straight.    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 No: NPRS scale: 0/10 Pain location: R hip Pain description: aching  Aggravating factors: "when I move my leg the wrong way it makes it hurt in my back"  (demonstrates marching) Relieving factors: not doing the motion that hurts   PRECAUTIONS: None  WEIGHT BEARING RESTRICTIONS: No  FALLS:  Has patient fallen in last 6 months? No  LIVING ENVIRONMENT: Lives with: lives with their  spouse Lives in: House/apartment, has basement  (usually goes down there 1x/month)  Stairs: yes , 6 steps to enter with step to pattern  Has following equipment at home: Single point cane, walker, shower grab bars   OCCUPATION: N/A Leisure: reading, computer games   PLOF: Independent with basic ADLs  PATIENT GOALS: getting her strength back,    OBJECTIVE:   DIAGNOSTIC FINDINGS: no relevant MSK imaging  PATIENT SURVEYS:  FOTO 41 52 @ DC 8 pts MCII  COGNITION: Overall cognitive status: Within functional limits for tasks assessed     SENSATION: WFL   POSTURE: rounded shoulders, forward head, and increased thoracic kyphosis   LOWER EXTREMITY ROM: WFL for tasks assessed; ileostomy limits hip flexion and ext ROM, stiffness noted with ER, IR, and knee ext due to HS length in seated  LOWER EXTREMITY MMT:  MMT Right eval Left eval  Hip flexion 4-/5 4/5  Hip extension    Hip abduction 4/5 4/5  Hip adduction 4/5 4/5  Hip internal rotation    Hip external rotation    Knee flexion 4-/5 4-/5  Knee extension 4/5 4/5  Ankle dorsiflexion    Ankle plantarflexion 4/5 4/5  Ankle inversion    Ankle eversion     (Blank rows = not tested)  FUNCTIONAL TESTS:  5 times sit to stand: 15.2s  Timed up and go (TUG): 14.3s  4 stage balance: passed NBOS, semi tandem, and tandem with R in rear, unable to perform SLS >2s  GAIT: Distance walked: 40ft Assistive device utilized: Single point cane Level of assistance: Complete Independence Comments: shortened step length, shuffling gait, low foot clearance   TODAY'S TREATMENT:                                                                                                                              DATE:   7/16  Nustep L4x8 minutes UE and LE  Circuit: 8x STS, 1x lap sidestep, 5lb suitcase carry at rail 1x laps; 4 rounds; 45s-1 min rest between (RPE 4-4.5)   6" box step up taps 3x 20s  (single UE) Tried with minimal rail  support Feet together with EC on airex pad 30s 3x Tandem reach outside BOS 10x each stance position (CGA- no rail use, but stood next to rail) Gait in hall without cane (light CGA)  7/11  Nustep  L4x8 minutes UE and LE  Circuit: 8x STS, 1x lap sidestep, 5lb suitcase carry at rail 1x laps; 4 rounds; 45s-1 min rest between (RPE 4-6)  6" box step up taps 3x 20s  (single UE) Feet together with EC on airex beam 30s 3x Tandem reach outside BOS 10x each stance position (at rail) SBA  7/9   Nustep L4x8 minutes UE and LE Circuit: 8x STS, 15 HR, 5lb suitcase carry at rail 2x laps; 4 rounds; 45s-1 min rest between  4" box step ups 2x10 with single UE finger touch (last set 8x without) Feet together with EC on airex 30s 3x Airex march 2x20 (no UE)  7/2   Nustep L4x8 minutes UE and LE  L stretch at rail- 20sec x3 (with R SB)  Sidestepping and retro walk at rail YTB at knees 3x laps  Heel raises 2x10 STS YTB at knees 3x8 4" box step ups 2x10 Feet together with EC on airex 30s 3x Airex march 2x20 Gait with head turns down rail 3x laps at 70 BPM   08/19/22  TherEx  Nustep L4x6 minutes BLEs only L stretch at rail- 20sec x3 Heel raises 2x10 Partial squats 2x10 LAQs 2.5# 2x10 B Standing hip ABD 2.5# x10 B Gait training full hall x1 with SPC, x46ft without cane Gait with head turns/nods with SPC Hip hikes 2x15 for R hip drop (floor level) FT on airex x30sec FA with EC on airex x30s FT on airexwith head turns/nods x30s ea  08/16/22  TherEx  Nustep L4x6 minutes BLEs only L stretch at rail- 20sec x3 Heel raises 2x10 Partial squats 2x10 STS x10 Seated QL stretch 20sec x3 to R LAQs 2.5# 2x10 B Standing hip ABD 2.5# x10 B Gait training full hall x1  08/13/22  TherEx  Nustep L4x6 minutes BLEs only STS 6# x10 LAQs 2.5# x10 B Standing hip ABD 2.5# x10 B Gait training with 2.5# each LE, 32ft no device cues for foot clearance   NMR  Tandem stance solid surface 3x30  seconds B  SLS with one foot on 4 inch box 3x30 seconds B  Narrow BOS solid surface 2x30 seconds      PATIENT EDUCATION:  Education details: anatomy, exercise progression, DOMS expectations, envelope of function, HEP, POC  Person educated: Patient Education method: Explanation, Demonstration, Tactile cues, Verbal cues, and Handouts Education comprehension: verbalized understanding, returned demonstration, verbal cues required, and tactile cues required  HOME EXERCISE PROGRAM: Access Code: [email or web address] URL: [email or web address] Date: 08/11/2022 Prepared by: Zebedee Iba  Updated 7/16  ASSESSMENT:  CLINICAL IMPRESSION: Pt demonstrates lower RPE with circuit training today. She was able to ambulate without cane in hall (~50ft ea way)  but light CGA utilized as a precaution. She continues to demonstrate severe hip drop with gait. Updated HEP to include balance exercises at counter and hip abduction/hip hikes.    OBJECTIVE IMPAIRMENTS: Abnormal gait, decreased activity tolerance, decreased balance, decreased endurance, decreased knowledge of use of DME, decreased mobility, difficulty walking, decreased ROM, decreased strength, hypomobility, impaired flexibility, improper body mechanics, postural dysfunction, and pain.    ACTIVITY LIMITATIONS: carrying, lifting, bending, standing, squatting, stairs, transfers, bed mobility, and locomotion level   PARTICIPATION LIMITATIONS: meal prep, cleaning, laundry, driving, shopping, and community activity and exercise   PERSONAL FACTORS: Age, Fitness, Time since onset of injury/illness/exacerbation, and 1-2 comorbidities:    are also affecting patient's functional outcome.    REHAB POTENTIAL: Fair     CLINICAL DECISION MAKING: Evolving/moderate complexity   EVALUATION  COMPLEXITY: Moderate     GOALS:     SHORT TERM GOALS: Target date: 09/22/2022    Pt will become independent with HEP in order to demonstrate synthesis of PT  education.    Goal status: INITIAL   2.  Pt will be able to perform 5XSTS in under 12s  in order to demonstrate functional improvement above the cut off score for adults.   Goal status: INITIAL   3.  Pt will be able to demonstrate ability to perform gait with upright posture and AD  in order to demonstrate functional improvement in LE function for safety with community ambulation    Goal status: INITIAL       LONG TERM GOALS: Target date: 11/03/2022      Pt will be able to demonstrate TUG in under 10 sec in order to demonstrate functional improvement in LE function, strength, balance, and mobility for safety with community ambulation.   Goal status: INITIAL   Pt will decrease 5XSTS by at least 3 seconds in order to demonstrate clinically significant improvement in LE strength    Goal status: INITIAL   3.  Pt will be able to demonstrate/report ability to walk >10 mins in order to demonstrate functional improvement and tolerance to exercise and community mobility.    Goal status: INITIAL   4.  Pt will score >/= 57 on FOTO to demonstrate improvement in perceived mobility and function.    Goal status: INITIAL        PLAN:   PT FREQUENCY: 1-2x/week   PT DURATION: 12 weeks   PLANNED INTERVENTIONS: Therapeutic exercises, Therapeutic activity, Neuromuscular re-education, Balance training, Gait training, Patient/Family education, Self Care, Joint mobilization, Joint manipulation, Stair training, Prosthetic training, DME instructions, Aquatic Therapy, Dry Needling, Electrical stimulation, Spinal manipulation, Spinal mobilization, Moist heat, scar mobilization, Splintting, Taping, Vasopneumatic device, Traction, Ultrasound, Ionotophoresis 4mg /ml Dexamethasone, Manual therapy, and Re-evaluation   PLAN FOR NEXT SESSION: LE exercise for transfers/transfer mechanics, consider resistance circuits, standing balance exercise, functional activity tolerance   Riki Altes, PTA  09/07/22  4:55 PM

## 2022-09-08 ENCOUNTER — Inpatient Hospital Stay: Payer: Medicare HMO

## 2022-09-08 ENCOUNTER — Other Ambulatory Visit: Payer: Self-pay

## 2022-09-09 ENCOUNTER — Encounter (HOSPITAL_BASED_OUTPATIENT_CLINIC_OR_DEPARTMENT_OTHER): Payer: Self-pay | Admitting: Physical Therapy

## 2022-09-09 ENCOUNTER — Ambulatory Visit (HOSPITAL_BASED_OUTPATIENT_CLINIC_OR_DEPARTMENT_OTHER): Payer: Medicare HMO | Admitting: Physical Therapy

## 2022-09-09 DIAGNOSIS — M6281 Muscle weakness (generalized): Secondary | ICD-10-CM

## 2022-09-09 DIAGNOSIS — R262 Difficulty in walking, not elsewhere classified: Secondary | ICD-10-CM

## 2022-09-09 NOTE — Therapy (Signed)
OUTPATIENT PHYSICAL THERAPY LOWER EXTREMITY TREATMENT  Progress Note Reporting Period 6/19 to 7/18  See note below for Objective Data and Assessment of Progress/Goals.       Patient Name: Nancy Harrison MRN: [id number] DOB:03-23-1940, 82 y.o., female Today's Date: 09/09/2022  END OF SESSION:  PT End of Session - 09/09/22 1401     Visit Number 9    Number of Visits 18    Date for PT Re-Evaluation 11/09/22    Authorization Type [employer] MCR    Progress Note Due on Visit 10    PT Start Time 1400    PT Stop Time 1440    PT Time Calculation (min) 40 min    Equipment Utilized During Treatment Gait belt    Activity Tolerance Patient tolerated treatment well    Behavior During Therapy WFL for tasks assessed/performed                  Past Medical History:  Diagnosis Date   Arthritis    Barrett's esophagus    Path 10/2001   Breast cancer (HCC) 01/01/2011   Cancer (HCC) 1999   rt breast   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   Cataract    Family history of colon cancer    Gastrostomy in place [facility])    LUQ - placed 10/16/2021   Hypertension    Ileostomy, has currently (HCC)    placed 09/2021   SUI (stress urinary incontinence, female)    Thyroid disease    Urinary urgency    Past Surgical History:  Procedure Laterality Date   BREAST LUMPECTOMY  1999   Chemo and radiation on right breast   IR IMAGING GUIDED PORT INSERTION  11/11/2021   LAPAROSCOPY N/A 10/16/2021   Procedure: LAPAROSCOPIC  DIVERTING OSTOMY, BIOSPY,  G-TUBE PLACEMENT;  Surgeon: Karie Soda, MD;  Location: [facility];  Service: General;  Laterality: N/A;   TONSILLECTOMY  1957   TOTAL KNEE ARTHROPLASTY  1999 & 2001   Patient Active Problem List   Diagnosis Date Noted   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   Acute renal failure (HCC) 05/30/2022   Non-recurrent unilateral inguinal hernia without obstruction or gangrene 04/13/2022   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   Non-recurrent bilateral  inguinal hernia without obstruction or gangrene 04/06/2022   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   Genetic testing 12/03/2021   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   Nausea & vomiting 11/06/2021   Hyponatremia 11/06/2021   Family history of colon cancer 11/05/2021   ARF (acute renal failure) (HCC) 11/05/2021   Irritant contact dermatitis associated with digestive stoma    Gastrostomy tube dysfunction (HCC)    Irritant contact dermatitis associated with fecal stoma    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 10/17/2021   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) 10/17/2021   Ileostomy care (HCC) 10/17/2021   Colonic mass 10/15/2021   History of breast cancer 12/30/2011   Leukopenia 04/09/2011   Anxiety state 04/06/2007   Esophageal reflux 04/06/2007   Essential hypertension 04/06/2007   Hyperlipidemia 04/06/2007   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 Pt is still fatigued with standing for longer periods.    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 No: NPRS scale: 0/10 Pain location: R hip Pain description: aching  Aggravating factors: "when I move my leg the wrong way it makes it hurt in my back"  (demonstrates marching) Relieving factors: not doing the motion that hurts   PRECAUTIONS: None  WEIGHT BEARING RESTRICTIONS: No  FALLS:  Has  patient fallen in last 6 months? No  LIVING ENVIRONMENT: Lives with: lives with their spouse Lives in: House/apartment, has basement  (usually goes down there 1x/month)  Stairs: yes , 6 steps to enter with step to pattern  Has following equipment at home: Single point cane, walker, shower grab bars   OCCUPATION: N/A Leisure: reading, computer games   PLOF: Independent with basic ADLs  PATIENT GOALS: getting her strength back,    OBJECTIVE:   DIAGNOSTIC FINDINGS: no relevant MSK imaging  PATIENT SURVEYS:  FOTO 41 52 @ DC 8 pts MCII  FOTO 7/18 47   LOWER EXTREMITY MMT:  MMT Right 7/18 Left 7/18  Hip flexion 4/5 4/5  Hip extension    Hip abduction 4/5 4/5  Hip adduction 4/5 4/5  Hip internal rotation    Hip external rotation    Knee flexion 4-/5 4-/5  Knee extension 4/5 4/5  Ankle dorsiflexion    Ankle plantarflexion 4/5 4/5  Ankle inversion    Ankle eversion     (Blank rows = not tested)  FUNCTIONAL TESTS:  5 times sit to stand: 15.2s  Timed up and go (TUG): 14.3s  7/18 5XSTS 14.5s   7/18 TUG 12.9s  4 stage balance: passed NBOS, semi tandem, and tandem with R in rear, unable to perform SLS >2s  GAIT: Distance walked: 39ft Assistive device utilized: Single point cane Level of assistance: Complete Independence Comments: shortened step length, shuffling gait, low foot clearance   TODAY'S TREATMENT:                                                                                                                              DATE:   7/18  Nustep L4x8 minutes UE and LE  All standing exercise (only sit as needed due to back discomfort, working on standing tolerance)   Seated QL stretch 30s 3x  GTB row 4x10 Tandem reach outside BOS 20x each stance position  Mini squat with back of chair 3x8 Standing HR 2x20 Sidestepping at rail YTB at knees 3x      7/16  Nustep L4x8 minutes UE and LE  Circuit: 8x STS, 1x lap sidestep, 5lb suitcase carry at  rail 1x laps; 4 rounds; 45s-1 min rest between (RPE 4-4.5)   6" box step up taps 3x 20s  (single UE) Tried with minimal rail  support Feet together with EC on airex pad 30s 3x Tandem reach outside BOS 10x each stance position (CGA- no rail use, but stood next to rail) Gait in hall without cane (light CGA)  7/11  Nustep L4x8 minutes UE and LE  Circuit: 8x STS, 1x lap sidestep, 5lb suitcase carry at rail 1x laps; 4 rounds; 45s-1 min rest between (RPE 4-6)  6" box step up taps 3x 20s  (single UE) Feet together with EC on airex beam 30s 3x Tandem reach outside BOS 10x each stance position (at rail) SBA  7/9   Nustep L4x8 minutes UE and LE Circuit: 8x STS, 15 HR, 5lb suitcase carry at rail 2x laps; 4 rounds; 45s-1 min rest between  4" box step ups 2x10 with single UE finger touch (last set 8x without) Feet together with EC on airex 30s 3x Airex march 2x20 (no UE)  7/2   Nustep L4x8 minutes UE and LE  L stretch at rail- 20sec x3 (with R SB)  Sidestepping and retro walk at rail YTB at knees 3x laps  Heel raises 2x10 STS YTB at knees 3x8 4" box step ups 2x10 Feet together with EC on airex 30s 3x Airex march 2x20 Gait with head turns down rail 3x laps at 70 BPM    PATIENT EDUCATION:  Education details: anatomy, exercise progression, DOMS expectations, envelope of function, HEP, POC  Person educated: Patient Education method: Explanation, Demonstration, Tactile cues, Verbal cues, and Handouts Education comprehension: verbalized understanding, returned demonstration, verbal cues required, and tactile cues required  HOME EXERCISE PROGRAM: Access Code: [email or web address] URL: [email or web address] Date: 08/11/2022 Prepared by: Zebedee Iba  Updated 7/16  ASSESSMENT:  CLINICAL IMPRESSION: Pt has made subjective and objective improvements as demonstrated by strength, functional tests, and FOTO measure. Pt session focused on increasing standing tolerance as pt continues  to most limited with standing fatigue. However, pt does have increase in lumbar discomfort and muscular fatigue. Pt is making steady improvements with improement in gait speed and LE strength. Plan to continue with functional endurance, balance, and strength as tolerated. Pt would benefit from continued skilled therapy in order to reach goals and maximize functional LE strength and endurance for prevention of further functional decline.    OBJECTIVE IMPAIRMENTS: Abnormal gait, decreased activity tolerance, decreased balance, decreased endurance, decreased knowledge of use of DME, decreased mobility, difficulty walking, decreased ROM, decreased strength, hypomobility, impaired flexibility, improper body mechanics, postural dysfunction, and pain.    ACTIVITY LIMITATIONS: carrying, lifting, bending, standing, squatting, stairs, transfers, bed mobility, and locomotion level   PARTICIPATION LIMITATIONS: meal prep, cleaning, laundry, driving, shopping, and community activity and exercise   PERSONAL FACTORS: Age, Fitness, Time since onset of injury/illness/exacerbation, and 1-2 comorbidities:    are also affecting patient's functional outcome.    REHAB POTENTIAL: Fair     CLINICAL DECISION MAKING: Evolving/moderate complexity   EVALUATION COMPLEXITY: Moderate     GOALS:     SHORT TERM GOALS: Target date: 09/22/2022    Pt will become independent with HEP in order to demonstrate synthesis of PT education.    Goal status: met   2.  Pt will be able to perform 5XSTS in under 12s  in order to demonstrate functional improvement above the cut off score for adults.   Goal status: ongoing   3.  Pt will be able to demonstrate ability to perform gait with upright posture and AD  in order to demonstrate functional improvement in LE function for safety with community ambulation  Goal status: met       LONG TERM GOALS: Target date: 11/03/2022      Pt will be able to demonstrate TUG in under 10 sec  in order to demonstrate functional improvement in LE function, strength, balance, and mobility for safety with community ambulation.   Goal status: ongoing   Pt will decrease 5XSTS by at least 3 seconds in order to demonstrate clinically significant improvement in LE strength    Goal status: ongoing   3.  Pt will be able to demonstrate/report ability to walk >10 mins in order to demonstrate functional improvement and tolerance to exercise and community mobility.    Goal status: partially met   4.  Pt will score >/= 57 on FOTO to demonstrate improvement in perceived mobility and function.    Goal status: ongoing        PLAN:   PT FREQUENCY: 1-2x/week   PT DURATION: 12 weeks   PLANNED INTERVENTIONS: Therapeutic exercises, Therapeutic activity, Neuromuscular re-education, Balance training, Gait training, Patient/Family education, Self Care, Joint mobilization, Joint manipulation, Stair training, Prosthetic training, DME instructions, Aquatic Therapy, Dry Needling, Electrical stimulation, Spinal manipulation, Spinal mobilization, Moist heat, scar mobilization, Splintting, Taping, Vasopneumatic device, Traction, Ultrasound, Ionotophoresis 4mg /ml Dexamethasone, Manual therapy, and Re-evaluation   PLAN FOR NEXT SESSION: LE exercise for transfers/transfer mechanics, consider resistance circuits, standing balance exercise, functional activity tolerance   Riki Altes, PTA  09/09/22 2:45 PM

## 2022-09-13 ENCOUNTER — Encounter (HOSPITAL_BASED_OUTPATIENT_CLINIC_OR_DEPARTMENT_OTHER): Payer: Self-pay

## 2022-09-13 ENCOUNTER — Ambulatory Visit (HOSPITAL_BASED_OUTPATIENT_CLINIC_OR_DEPARTMENT_OTHER): Payer: Medicare HMO

## 2022-09-13 DIAGNOSIS — M6281 Muscle weakness (generalized): Secondary | ICD-10-CM | POA: Diagnosis not present

## 2022-09-13 DIAGNOSIS — R262 Difficulty in walking, not elsewhere classified: Secondary | ICD-10-CM | POA: Diagnosis not present

## 2022-09-13 NOTE — Therapy (Signed)
OUTPATIENT PHYSICAL THERAPY LOWER EXTREMITY TREATMENT     Patient Name: Nancy Harrison MRN: [id number] DOB:04-01-1940, 82 y.o., female Today's Date: 09/13/2022  END OF SESSION:  PT End of Session - 09/13/22 1534     Visit Number 10    Number of Visits 18    Date for PT Re-Evaluation 11/09/22    Authorization Type [employer] MCR    Progress Note Due on Visit 19    PT Start Time 1432    PT Stop Time 1510    PT Time Calculation (min) 38 min    Equipment Utilized During Treatment Gait belt    Activity Tolerance Patient tolerated treatment well    Behavior During Therapy WFL for tasks assessed/performed                   Past Medical History:  Diagnosis Date   Arthritis    Barrett's esophagus    Path 10/2001   Breast cancer (HCC) 01/01/2011   Cancer (HCC) 1999   rt breast   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   Cataract    Family history of colon cancer    Gastrostomy in place [facility])    LUQ - placed 10/16/2021   Hypertension    Ileostomy, has currently (HCC)    placed 09/2021   SUI (stress urinary incontinence, female)    Thyroid disease    Urinary urgency    Past Surgical History:  Procedure Laterality Date   BREAST LUMPECTOMY  1999   Chemo and radiation on right breast   IR IMAGING GUIDED PORT INSERTION  11/11/2021   LAPAROSCOPY N/A 10/16/2021   Procedure: LAPAROSCOPIC  DIVERTING OSTOMY, BIOSPY,  G-TUBE PLACEMENT;  Surgeon: Karie Soda, MD;  Location: [facility];  Service: General;  Laterality: N/A;   TONSILLECTOMY  1957   TOTAL KNEE ARTHROPLASTY  1999 & 2001   Patient Active Problem List   Diagnosis Date Noted   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   Acute renal failure (HCC) 05/30/2022   Non-recurrent unilateral inguinal hernia without obstruction or gangrene 04/13/2022   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   Non-recurrent bilateral inguinal hernia without obstruction or gangrene 04/06/2022   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   Genetic testing  12/03/2021   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   Nausea & vomiting 11/06/2021   Hyponatremia 11/06/2021   Family history of colon cancer 11/05/2021   ARF (acute renal failure) (HCC) 11/05/2021   Irritant contact dermatitis associated with digestive stoma    Gastrostomy tube dysfunction (HCC)    Irritant contact dermatitis associated with fecal stoma    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 10/17/2021   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) 10/17/2021   Ileostomy care (HCC) 10/17/2021   Colonic mass 10/15/2021   History of breast cancer 12/30/2011   Leukopenia 04/09/2011   Anxiety state 04/06/2007   Esophageal reflux 04/06/2007   Essential hypertension 04/06/2007   Hyperlipidemia 04/06/2007   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 "I'm tired." Mild L sided back pain.   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 No: NPRS scale: 0/10 Pain location: R hip Pain description: aching  Aggravating factors: "when I move my leg the wrong way it makes it hurt in my back"  (demonstrates marching) Relieving factors: not doing the motion that hurts   PRECAUTIONS: None  WEIGHT BEARING RESTRICTIONS: No  FALLS:  Has patient fallen in last 6 months? No  LIVING ENVIRONMENT: Lives with: lives with their  spouse Lives in: House/apartment, has basement  (usually goes down there 1x/month)  Stairs: yes , 6 steps to enter with step to pattern  Has following equipment at home: Single point cane, walker, shower grab bars   OCCUPATION: N/A Leisure: reading, computer games   PLOF: Independent with basic ADLs  PATIENT GOALS: getting her strength back,    OBJECTIVE:   DIAGNOSTIC FINDINGS: no relevant MSK imaging  PATIENT SURVEYS:  FOTO 41 52 @ DC 8 pts MCII  FOTO 7/18 47   LOWER EXTREMITY MMT:  MMT Right 7/18 Left 7/18  Hip flexion 4/5 4/5  Hip extension    Hip abduction 4/5 4/5  Hip adduction 4/5 4/5  Hip internal rotation    Hip external rotation    Knee flexion 4-/5 4-/5  Knee extension 4/5 4/5  Ankle dorsiflexion    Ankle plantarflexion 4/5 4/5  Ankle inversion    Ankle eversion     (Blank rows = not tested)  FUNCTIONAL TESTS:  5 times sit to stand: 15.2s  Timed up and go (TUG): 14.3s  7/18 5XSTS 14.5s   7/18 TUG 12.9s  4 stage balance: passed NBOS, semi tandem, and tandem with R in rear, unable to perform SLS >2s  GAIT: Distance walked: 35ft Assistive device utilized: Single point cane Level of assistance: Complete Independence Comments: shortened step length, shuffling gait, low foot clearance   TODAY'S TREATMENT:                                                                                                                              DATE:   7/22  Nustep L4x8 minutes UE and LE  All standing exercise (only sit as needed due to back discomfort, working on standing tolerance)   Tandem reach outside BOS 30sec ea position NBOS with head nods/turns x30 sec ea-standing on airex Mini squat at rail 2x10 Standing HR 2x20 Standing march x20 Gait with SPC 237 ft Sidestepping at rail YTB at knees 3x  Step ups 4" L LE 2x10, 6" RLE 2x10  7/18  Nustep L4x8 minutes UE and LE  All standing exercise (only sit as needed due to back discomfort, working on  standing tolerance)   Seated QL stretch 30s 3x  GTB row 4x10 Tandem reach outside BOS 20x each stance position  Mini squat with back of chair  3x8 Standing HR 2x20 Sidestepping at rail YTB at knees 3x      7/16  Nustep L4x8 minutes UE and LE  Circuit: 8x STS, 1x lap sidestep, 5lb suitcase carry at rail 1x laps; 4 rounds; 45s-1 min rest between (RPE 4-4.5)   6" box step up taps 3x 20s  (single UE) Tried with minimal rail support Feet together with EC on airex pad 30s 3x Tandem reach outside BOS 10x each stance position (CGA- no rail use, but stood next to rail) Gait in hall without cane (light CGA)  7/11  Nustep L4x8 minutes UE and LE  Circuit: 8x STS, 1x lap sidestep, 5lb suitcase carry at rail 1x laps; 4 rounds; 45s-1 min rest between (RPE 4-6)  6" box step up taps 3x 20s  (single UE) Feet together with EC on airex beam 30s 3x Tandem reach outside BOS 10x each stance position (at rail) SBA     PATIENT EDUCATION:  Education details: anatomy, exercise progression, DOMS expectations, envelope of function, HEP, POC  Person educated: Patient Education method: Explanation, Demonstration, Tactile cues, Verbal cues, and Handouts Education comprehension: verbalized understanding, returned demonstration, verbal cues required, and tactile cues required  HOME EXERCISE PROGRAM: Access Code: [email or web address] URL: [email or web address] Date: 08/11/2022 Prepared by: Zebedee Iba  Updated 7/16  ASSESSMENT:  CLINICAL IMPRESSION: Continued to work on building tolerance for standing activity. Pt denies significant improvement with ambulation endurance since starting PT. She was able to walk in clinic for 223ft with SPC. Does demonstrate hip flexion and dorsiflexion deficits with gait. Will continue to work on Scientist, clinical (histocompatibility and immunogenetics) and improving performance with functional tasks.   OBJECTIVE IMPAIRMENTS: Abnormal gait, decreased activity tolerance, decreased balance, decreased  endurance, decreased knowledge of use of DME, decreased mobility, difficulty walking, decreased ROM, decreased strength, hypomobility, impaired flexibility, improper body mechanics, postural dysfunction, and pain.    ACTIVITY LIMITATIONS: carrying, lifting, bending, standing, squatting, stairs, transfers, bed mobility, and locomotion level   PARTICIPATION LIMITATIONS: meal prep, cleaning, laundry, driving, shopping, and community activity and exercise   PERSONAL FACTORS: Age, Fitness, Time since onset of injury/illness/exacerbation, and 1-2 comorbidities:    are also affecting patient's functional outcome.    REHAB POTENTIAL: Fair     CLINICAL DECISION MAKING: Evolving/moderate complexity   EVALUATION COMPLEXITY: Moderate     GOALS:     SHORT TERM GOALS: Target date: 09/22/2022    Pt will become independent with HEP in order to demonstrate synthesis of PT education.    Goal status: met   2.  Pt will be able to perform 5XSTS in under 12s  in order to demonstrate functional improvement above the cut off score for adults.   Goal status: ongoing   3.  Pt will be able to demonstrate ability to perform gait with upright posture and AD  in order to demonstrate functional improvement in LE function for safety with community ambulation    Goal status: met       LONG TERM GOALS: Target date: 11/03/2022      Pt will be able to demonstrate TUG in under 10 sec in order to demonstrate functional improvement in LE function, strength, balance, and mobility for safety with community ambulation.   Goal status: ongoing   Pt will decrease 5XSTS by at least 3 seconds in order to demonstrate clinically significant improvement in LE strength    Goal status: ongoing   3.  Pt will be able to demonstrate/report ability to walk >10 mins in order to demonstrate  functional improvement and tolerance to exercise and community mobility.    Goal status: partially met   4.  Pt will score >/= 57 on FOTO  to demonstrate improvement in perceived mobility and function.    Goal status: ongoing        PLAN:   PT FREQUENCY: 1-2x/week   PT DURATION: 12 weeks   PLANNED INTERVENTIONS: Therapeutic exercises, Therapeutic activity, Neuromuscular re-education, Balance training, Gait training, Patient/Family education, Self Care, Joint mobilization, Joint manipulation, Stair training, Prosthetic training, DME instructions, Aquatic Therapy, Dry Needling, Electrical stimulation, Spinal manipulation, Spinal mobilization, Moist heat, scar mobilization, Splintting, Taping, Vasopneumatic device, Traction, Ultrasound, Ionotophoresis 4mg /ml Dexamethasone, Manual therapy, and Re-evaluation   PLAN FOR NEXT SESSION: LE exercise for transfers/transfer mechanics, consider resistance circuits, standing balance exercise, functional activity tolerance   Riki Altes, PTA  09/13/22 5:05 PM

## 2022-09-14 ENCOUNTER — Encounter (HOSPITAL_BASED_OUTPATIENT_CLINIC_OR_DEPARTMENT_OTHER): Payer: Medicare HMO | Admitting: Physical Therapy

## 2022-09-15 ENCOUNTER — Ambulatory Visit (HOSPITAL_BASED_OUTPATIENT_CLINIC_OR_DEPARTMENT_OTHER): Payer: Medicare HMO

## 2022-09-15 ENCOUNTER — Encounter (HOSPITAL_BASED_OUTPATIENT_CLINIC_OR_DEPARTMENT_OTHER): Payer: Self-pay

## 2022-09-15 DIAGNOSIS — Z932 Ileostomy status: Secondary | ICD-10-CM | POA: Diagnosis not present

## 2022-09-15 DIAGNOSIS — R262 Difficulty in walking, not elsewhere classified: Secondary | ICD-10-CM | POA: Diagnosis not present

## 2022-09-15 DIAGNOSIS — M6281 Muscle weakness (generalized): Secondary | ICD-10-CM

## 2022-09-15 NOTE — Therapy (Signed)
OUTPATIENT PHYSICAL THERAPY LOWER EXTREMITY TREATMENT     Patient Name: Nancy Harrison MRN: [id number] DOB:1940/12/06, 82 y.o., female Today's Date: 09/15/2022  END OF SESSION:  PT End of Session - 09/15/22 1528     Visit Number 11    Number of Visits 18    Date for PT Re-Evaluation 11/09/22    Authorization Type [employer] MCR    Progress Note Due on Visit 19    PT Start Time 1431    PT Stop Time 1515    PT Time Calculation (min) 44 min    Equipment Utilized During Treatment Gait belt    Activity Tolerance Patient tolerated treatment well    Behavior During Therapy WFL for tasks assessed/performed                    Past Medical History:  Diagnosis Date   Arthritis    Barrett's esophagus    Path 10/2001   Breast cancer (HCC) 01/01/2011   Cancer (HCC) 1999   rt breast   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   Cataract    Family history of colon cancer    Gastrostomy in place [facility])    LUQ - placed 10/16/2021   Hypertension    Ileostomy, has currently (HCC)    placed 09/2021   SUI (stress urinary incontinence, female)    Thyroid disease    Urinary urgency    Past Surgical History:  Procedure Laterality Date   BREAST LUMPECTOMY  1999   Chemo and radiation on right breast   IR IMAGING GUIDED PORT INSERTION  11/11/2021   LAPAROSCOPY N/A 10/16/2021   Procedure: LAPAROSCOPIC  DIVERTING OSTOMY, BIOSPY,  G-TUBE PLACEMENT;  Surgeon: Karie Soda, MD;  Location: [facility];  Service: General;  Laterality: N/A;   TONSILLECTOMY  1957   TOTAL KNEE ARTHROPLASTY  1999 & 2001   Patient Active Problem List   Diagnosis Date Noted   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   Acute renal failure (HCC) 05/30/2022   Non-recurrent unilateral inguinal hernia without obstruction or gangrene 04/13/2022   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   Non-recurrent bilateral inguinal hernia without obstruction or gangrene 04/06/2022   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   Genetic testing  12/03/2021   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   Nausea & vomiting 11/06/2021   Hyponatremia 11/06/2021   Family history of colon cancer 11/05/2021   ARF (acute renal failure) (HCC) 11/05/2021   Irritant contact dermatitis associated with digestive stoma    Gastrostomy tube dysfunction (HCC)    Irritant contact dermatitis associated with fecal stoma    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 10/17/2021   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) 10/17/2021   Ileostomy care (HCC) 10/17/2021   Colonic mass 10/15/2021   History of breast cancer 12/30/2011   Leukopenia 04/09/2011   Anxiety state 04/06/2007   Esophageal reflux 04/06/2007   Essential hypertension 04/06/2007   Hyperlipidemia 04/06/2007   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 "I'm tired." Mild L sided back pain.  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 No: NPRS scale: 0/10 Pain location: R hip Pain description: aching  Aggravating factors: "when I move my leg the wrong way it makes it hurt in my back"  (demonstrates marching) Relieving factors: not doing the motion that hurts   PRECAUTIONS: None  WEIGHT BEARING RESTRICTIONS: No  FALLS:  Has patient fallen in last 6 months? No  LIVING ENVIRONMENT: Lives with: lives with their  spouse Lives in: House/apartment, has basement  (usually goes down there 1x/month)  Stairs: yes , 6 steps to enter with step to pattern  Has following equipment at home: Single point cane, walker, shower grab bars   OCCUPATION: N/A Leisure: reading, computer games   PLOF: Independent with basic ADLs  PATIENT GOALS: getting her strength back,    OBJECTIVE:   DIAGNOSTIC FINDINGS: no relevant MSK imaging  PATIENT SURVEYS:  FOTO 41 52 @ DC 8 pts MCII  FOTO 7/18 47   LOWER EXTREMITY MMT:  MMT Right 7/18 Left 7/18  Hip flexion 4/5 4/5  Hip extension    Hip abduction 4/5 4/5  Hip adduction 4/5 4/5  Hip internal rotation    Hip external rotation    Knee flexion 4-/5 4-/5  Knee extension 4/5 4/5  Ankle dorsiflexion    Ankle plantarflexion 4/5 4/5  Ankle inversion    Ankle eversion     (Blank rows = not tested)  FUNCTIONAL TESTS:  5 times sit to stand: 15.2s  Timed up and go (TUG): 14.3s  7/18 5XSTS 14.5s   7/18 TUG 12.9s  4 stage balance: passed NBOS, semi tandem, and tandem with R in rear, unable to perform SLS >2s  GAIT: Distance walked: 56ft Assistive device utilized: Single point cane Level of assistance: Complete Independence Comments: shortened step length, shuffling gait, low foot clearance   TODAY'S TREATMENT:                                                                                                                              DATE:   7/24  Nustep L4x8 minutes UE and LE  All standing exercise (only sit as needed due to back discomfort, working on standing tolerance)   Tandem reach outside BOS 30sec ea position NBOS with head nods/turns 2x30 sec ea-standing on airex Mini squat at rail 2x10 Standing HR 2x20 Standing march 2x20 Gait with SPC 237 ft Sidestepping at rail YTB at knees 3x  Step ups 4" L LE 2x10, 6" RLE 2x10  Sit to stands from chair- 2x10 7/22  Nustep L4x8 minutes UE and LE  All standing exercise (only sit as needed  due to back discomfort, working on standing tolerance)   Tandem reach outside BOS 30sec ea position NBOS with head nods/turns x30 sec ea-standing on airex Mini  squat at rail 2x10 Standing HR 2x20 Standing march x20 Gait with SPC 237 ft Sidestepping at rail YTB at knees 3x  Step ups 4" L LE 2x10, 6" RLE 2x10  7/18  Nustep L4x8 minutes UE and LE  All standing exercise (only sit as needed due to back discomfort, working on standing tolerance)   Seated QL stretch 30s 3x  GTB row 4x10 Tandem reach outside BOS 20x each stance position  Mini squat with back of chair 3x8 Standing HR 2x20 Sidestepping at rail YTB at knees 3x      7/16  Nustep L4x8 minutes UE and LE  Circuit: 8x STS, 1x lap sidestep, 5lb suitcase carry at rail 1x laps; 4 rounds; 45s-1 min rest between (RPE 4-4.5)   6" box step up taps 3x 20s  (single UE) Tried with minimal rail support Feet together with EC on airex pad 30s 3x Tandem reach outside BOS 10x each stance position (CGA- no rail use, but stood next to rail) Gait in hall without cane (light CGA)  7/11  Nustep L4x8 minutes UE and LE  Circuit: 8x STS, 1x lap sidestep, 5lb suitcase carry at rail 1x laps; 4 rounds; 45s-1 min rest between (RPE 4-6)  6" box step up taps 3x 20s  (single UE) Feet together with EC on airex beam 30s 3x Tandem reach outside BOS 10x each stance position (at rail) SBA     PATIENT EDUCATION:  Education details: anatomy, exercise progression, DOMS expectations, envelope of function, HEP, POC  Person educated: Patient Education method: Explanation, Demonstration, Tactile cues, Verbal cues, and Handouts Education comprehension: verbalized understanding, returned demonstration, verbal cues required, and tactile cues required  HOME EXERCISE PROGRAM: Access Code: [email or web address] URL: [email or web address] Date: 08/11/2022 Prepared by: Zebedee Iba  Updated 7/16  ASSESSMENT:  CLINICAL IMPRESSION: Pt with  increasing endurance for activity. Able to complete 2x10 sit to stands from chair level without difficulty or c/o pain. She was able to again ambulate 269ft in clinic with [facility] with improved pattern. Will continue to work on stability, endurance, and functional strength. Pt reports she feels her balance has improved since IE.   OBJECTIVE IMPAIRMENTS: Abnormal gait, decreased activity tolerance, decreased balance, decreased endurance, decreased knowledge of use of DME, decreased mobility, difficulty walking, decreased ROM, decreased strength, hypomobility, impaired flexibility, improper body mechanics, postural dysfunction, and pain.    ACTIVITY LIMITATIONS: carrying, lifting, bending, standing, squatting, stairs, transfers, bed mobility, and locomotion level   PARTICIPATION LIMITATIONS: meal prep, cleaning, laundry, driving, shopping, and community activity and exercise   PERSONAL FACTORS: Age, Fitness, Time since onset of injury/illness/exacerbation, and 1-2 comorbidities:    are also affecting patient's functional outcome.    REHAB POTENTIAL: Fair     CLINICAL DECISION MAKING: Evolving/moderate complexity   EVALUATION COMPLEXITY: Moderate     GOALS:     SHORT TERM GOALS: Target date: 09/22/2022    Pt will become independent with HEP in order to demonstrate synthesis of PT education.    Goal status: met   2.  Pt will be able to perform 5XSTS in under 12s  in order to demonstrate functional improvement above the cut off score for adults.   Goal status: ongoing   3.  Pt will be able to demonstrate ability to perform gait with upright posture and AD  in order to demonstrate functional improvement in LE function for safety with community ambulation    Goal status: met       LONG TERM GOALS: Target date:  11/03/2022      Pt will be able to demonstrate TUG in under 10 sec in order to demonstrate functional improvement in LE function, strength, balance, and mobility for safety with  community ambulation.   Goal status: ongoing   Pt will decrease 5XSTS by at least 3 seconds in order to demonstrate clinically significant improvement in LE strength    Goal status: ongoing   3.  Pt will be able to demonstrate/report ability to walk >10 mins in order to demonstrate functional improvement and tolerance to exercise and community mobility.    Goal status: partially met   4.  Pt will score >/= 57 on FOTO to demonstrate improvement in perceived mobility and function.    Goal status: ongoing        PLAN:   PT FREQUENCY: 1-2x/week   PT DURATION: 12 weeks   PLANNED INTERVENTIONS: Therapeutic exercises, Therapeutic activity, Neuromuscular re-education, Balance training, Gait training, Patient/Family education, Self Care, Joint mobilization, Joint manipulation, Stair training, Prosthetic training, DME instructions, Aquatic Therapy, Dry Needling, Electrical stimulation, Spinal manipulation, Spinal mobilization, Moist heat, scar mobilization, Splintting, Taping, Vasopneumatic device, Traction, Ultrasound, Ionotophoresis 4mg /ml Dexamethasone, Manual therapy, and Re-evaluation   PLAN FOR NEXT SESSION: LE exercise for transfers/transfer mechanics, consider resistance circuits, standing balance exercise, functional activity tolerance   Riki Altes, PTA  09/15/22 3:30 PM

## 2022-09-16 ENCOUNTER — Encounter (HOSPITAL_BASED_OUTPATIENT_CLINIC_OR_DEPARTMENT_OTHER): Payer: Medicare HMO | Admitting: Physical Therapy

## 2022-09-19 ENCOUNTER — Other Ambulatory Visit: Payer: Self-pay | Admitting: Oncology

## 2022-09-21 ENCOUNTER — Encounter (HOSPITAL_BASED_OUTPATIENT_CLINIC_OR_DEPARTMENT_OTHER): Payer: Self-pay

## 2022-09-21 ENCOUNTER — Ambulatory Visit (HOSPITAL_BASED_OUTPATIENT_CLINIC_OR_DEPARTMENT_OTHER): Payer: Medicare HMO

## 2022-09-21 DIAGNOSIS — R262 Difficulty in walking, not elsewhere classified: Secondary | ICD-10-CM | POA: Diagnosis not present

## 2022-09-21 DIAGNOSIS — M6281 Muscle weakness (generalized): Secondary | ICD-10-CM

## 2022-09-21 NOTE — Therapy (Signed)
OUTPATIENT PHYSICAL THERAPY LOWER EXTREMITY TREATMENT     Patient Name: Nancy Harrison MRN: [id number] DOB:[birth year], 82 y.o., female Today's Date: 09/21/2022  END OF SESSION:  PT End of Session - 09/21/22 1439     Visit Number 12    Number of Visits 18    Date for PT Re-Evaluation 11/09/22    Authorization Type [employer] MCR    Progress Note Due on Visit 19    PT Start Time 1431    PT Stop Time 1516    PT Time Calculation (min) 45 min    Activity Tolerance Patient tolerated treatment well    Behavior During Therapy [facility] for tasks assessed/performed                     Past Medical History:  Diagnosis Date   Arthritis    Barrett's esophagus    Path 10/2001   Breast cancer (HCC) 01/01/2011   Cancer (HCC) 1999   rt breast   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   Cataract    Family history of colon cancer    Gastrostomy in place [facility])    LUQ - placed 10/16/2021   Hypertension    Ileostomy, has currently (HCC)    placed 09/2021   SUI (stress urinary incontinence, female)    Thyroid disease    Urinary urgency    Past Surgical History:  Procedure Laterality Date   BREAST LUMPECTOMY  1999   Chemo and radiation on right breast   IR IMAGING GUIDED PORT INSERTION  11/11/2021   LAPAROSCOPY N/A 10/16/2021   Procedure: LAPAROSCOPIC  DIVERTING OSTOMY, BIOSPY,  G-TUBE PLACEMENT;  Surgeon: Karie Soda, MD;  Location: [facility];  Service: General;  Laterality: N/A;   TONSILLECTOMY  1957   TOTAL KNEE ARTHROPLASTY  1999 & 2001   Patient Active Problem List   Diagnosis Date Noted   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   Acute renal failure (HCC) 05/30/2022   Non-recurrent unilateral inguinal hernia without obstruction or gangrene 04/13/2022   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   Non-recurrent bilateral inguinal hernia without obstruction or gangrene 04/06/2022   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   Genetic testing 12/03/2021   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   Nausea & vomiting 11/06/2021   Hyponatremia 11/06/2021   Family history of colon cancer 11/05/2021   ARF (acute renal failure) (HCC) 11/05/2021   Irritant contact dermatitis associated with digestive stoma    Gastrostomy tube dysfunction (HCC)    Irritant contact dermatitis associated with fecal stoma    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 10/17/2021   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) 10/17/2021   Ileostomy care (HCC) 10/17/2021   Colonic mass 10/15/2021   History of breast cancer 12/30/2011   Leukopenia 04/09/2011   Anxiety state 04/06/2007   Esophageal reflux 04/06/2007   Essential hypertension 04/06/2007   Hyperlipidemia 04/06/2007   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 No: NPRS scale: 0/10 Pain location: R hip Pain description: aching  Aggravating factors: "when I move my leg the wrong way it makes it hurt in my back"  (demonstrates marching) Relieving factors: not doing the motion that hurts   PRECAUTIONS: None  WEIGHT BEARING RESTRICTIONS: No  FALLS:  Has patient fallen in last 6 months? No  LIVING ENVIRONMENT: Lives with: lives with their spouse Lives in: House/apartment, has basement  (usually goes down there 1x/month)  Stairs: yes , 6 steps to enter with  step to pattern  Has following equipment at home: Single point cane, walker, shower grab bars   OCCUPATION: N/A Leisure: reading, computer games   PLOF: Independent with basic ADLs  PATIENT GOALS: getting her strength back,    OBJECTIVE:   DIAGNOSTIC FINDINGS: no relevant MSK imaging  PATIENT SURVEYS:  FOTO 41 52 @ DC 8 pts MCII  FOTO 7/18 47 FOTO 7/30: 42   LOWER EXTREMITY MMT:  MMT Right 7/18 Left 7/18  Hip flexion 4/5 4/5  Hip extension    Hip abduction 4/5 4/5  Hip adduction 4/5 4/5  Hip internal rotation    Hip external rotation    Knee flexion 4-/5 4-/5  Knee extension 4/5 4/5  Ankle dorsiflexion    Ankle plantarflexion 4/5 4/5  Ankle inversion    Ankle eversion     (Blank rows = not tested)  FUNCTIONAL TESTS:  5 times sit to stand: 15.2s  Timed up and go (TUG): 14.3s  7/18 5XSTS 14.5s   7/18 TUG 12.9s  4 stage balance: passed NBOS, semi tandem, and tandem with R in rear, unable to perform SLS >2s  GAIT: Distance walked: 44ft Assistive device utilized: Single point cane Level of assistance: Complete Independence Comments: shortened step length, shuffling gait, low foot clearance   TODAY'S TREATMENT:                                                                                                                              DATE:   7/30  Nustep L4x8 minutes UE and LE  All standing exercise (only sit as needed due to back discomfort, working on standing tolerance)   NBOS with head nods/turns 2x30 sec ea-standing on airex FT on airex with cone reach across body x10ea SBA Mini squat at rail 2x10 Standing HR 2x15 Standing march 2x20 Gait with SPC 237 ft Sidestepping at rail YTB at knees 3x  Step ups 4" L LE 2x10, 6" RLE 2x10  Sit to stands from chair- 2x10   7/24  Nustep L4x8 minutes UE and LE  All standing exercise (only sit as needed due to back discomfort, working on standing tolerance)   Tandem reach outside BOS 30sec ea  position NBOS with head nods/turns 2x30 sec ea-standing on airex Mini squat at rail 2x10 Standing HR 2x20 Standing march 2x20 Gait with SPC 237 ft  Sidestepping at rail YTB at knees 3x  Step ups 4" L LE 2x10, 6" RLE 2x10  Sit to stands from chair- 2x10 7/22  Nustep L4x8 minutes UE and LE  All standing exercise (only sit as needed due to back discomfort, working on standing tolerance)   Tandem reach outside BOS 30sec ea position NBOS with head nods/turns x30 sec ea-standing on airex Mini squat at rail 2x10 Standing HR 2x20 Standing march x20 Gait with SPC 237 ft Sidestepping at rail YTB at knees 3x  Step ups 4" L LE 2x10, 6" RLE 2x10  7/18  Nustep L4x8 minutes UE and LE  All standing exercise (only sit as needed due to back discomfort, working on standing tolerance)   Seated QL stretch 30s 3x  GTB row 4x10 Tandem reach outside BOS 20x each stance position  Mini squat with back of chair 3x8 Standing HR 2x20 Sidestepping at rail YTB at knees 3x   PATIENT EDUCATION:  Education details: anatomy, exercise progression, DOMS expectations, envelope of function, HEP, POC  Person educated: Patient Education method: Explanation, Demonstration, Tactile cues, Verbal cues, and Handouts Education comprehension: verbalized understanding, returned demonstration, verbal cues required, and tactile cues required  HOME EXERCISE PROGRAM: Access Code: [email or web address] URL: [email or web address] Date: 08/11/2022 Prepared by: Zebedee Iba  Updated 7/16  ASSESSMENT:  CLINICAL IMPRESSION: Pt continues to demonstrate improved endurance for activity. She does continue to ambulate with decreased hip flexion and shortened step length. Mild difficulty initiating sit to stands from chair today, though she was able to complete all repetitions. Pt will continue to benefit from strengthening, balance, and endurance based exercises.   OBJECTIVE IMPAIRMENTS: Abnormal gait, decreased activity  tolerance, decreased balance, decreased endurance, decreased knowledge of use of DME, decreased mobility, difficulty walking, decreased ROM, decreased strength, hypomobility, impaired flexibility, improper body mechanics, postural dysfunction, and pain.    ACTIVITY LIMITATIONS: carrying, lifting, bending, standing, squatting, stairs, transfers, bed mobility, and locomotion level   PARTICIPATION LIMITATIONS: meal prep, cleaning, laundry, driving, shopping, and community activity and exercise   PERSONAL FACTORS: Age, Fitness, Time since onset of injury/illness/exacerbation, and 1-2 comorbidities:    are also affecting patient's functional outcome.    REHAB POTENTIAL: Fair     CLINICAL DECISION MAKING: Evolving/moderate complexity   EVALUATION COMPLEXITY: Moderate     GOALS:     SHORT TERM GOALS: Target date: 09/22/2022    Pt will become independent with HEP in order to demonstrate synthesis of PT education.    Goal status: met   2.  Pt will be able to perform 5XSTS in under 12s  in order to demonstrate functional improvement above the cut off score for adults.   Goal status: ongoing   3.  Pt will be able to demonstrate ability to perform gait with upright posture and AD  in order to demonstrate functional improvement in LE function for safety with community ambulation    Goal status: met       LONG TERM GOALS: Target date: 11/03/2022      Pt will be able to demonstrate TUG in under 10 sec in order to demonstrate functional improvement in LE function, strength, balance, and mobility for safety with community ambulation.   Goal status: ongoing   Pt will decrease 5XSTS by at least 3 seconds in order to demonstrate clinically significant improvement in LE strength    Goal status: ongoing   3.  Pt will be able to demonstrate/report ability to walk >10 mins in order to demonstrate functional  improvement and tolerance to exercise and community mobility.    Goal status: partially  met   4.  Pt will score >/= 57 on FOTO to demonstrate improvement in perceived mobility and function.    Goal status: ongoing        PLAN:   PT FREQUENCY: 1-2x/week   PT DURATION: 12 weeks   PLANNED INTERVENTIONS: Therapeutic exercises, Therapeutic activity, Neuromuscular re-education, Balance training, Gait training, Patient/Family education, Self Care, Joint mobilization, Joint manipulation, Stair training, Prosthetic training, DME instructions, Aquatic Therapy, Dry Needling, Electrical stimulation, Spinal manipulation, Spinal mobilization, Moist heat, scar mobilization, Splintting, Taping, Vasopneumatic device, Traction, Ultrasound, Ionotophoresis 4mg /ml Dexamethasone, Manual therapy, and Re-evaluation   PLAN FOR NEXT SESSION: LE exercise for transfers/transfer mechanics, consider resistance circuits, standing balance exercise, functional activity tolerance   Riki Altes, PTA  09/21/22 4:05 PM

## 2022-09-23 ENCOUNTER — Ambulatory Visit (HOSPITAL_BASED_OUTPATIENT_CLINIC_OR_DEPARTMENT_OTHER): Payer: Medicare HMO | Attending: Oncology | Admitting: Physical Therapy

## 2022-09-23 ENCOUNTER — Encounter (HOSPITAL_BASED_OUTPATIENT_CLINIC_OR_DEPARTMENT_OTHER): Payer: Self-pay | Admitting: Physical Therapy

## 2022-09-23 DIAGNOSIS — R262 Difficulty in walking, not elsewhere classified: Secondary | ICD-10-CM | POA: Diagnosis not present

## 2022-09-23 DIAGNOSIS — M6281 Muscle weakness (generalized): Secondary | ICD-10-CM | POA: Diagnosis not present

## 2022-09-23 NOTE — Therapy (Signed)
OUTPATIENT PHYSICAL THERAPY LOWER EXTREMITY TREATMENT     Patient Name: Nancy Harrison MRN: [id number] DOB:May 03, 1940, 82 y.o., female Today's Date: 09/23/2022  END OF SESSION:  PT End of Session - 09/23/22 1450     Visit Number 13    Number of Visits 18    Date for PT Re-Evaluation 11/09/22    Authorization Type [employer] MCR    Progress Note Due on Visit 19    PT Start Time 1446    PT Stop Time 1518    PT Time Calculation (min) 32 min    Activity Tolerance Patient tolerated treatment well    Behavior During Therapy [facility] for tasks assessed/performed                     Past Medical History:  Diagnosis Date   Arthritis    Barrett's esophagus    Path 10/2001   Breast cancer (HCC) 01/01/2011   Cancer (HCC) 1999   rt breast   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   Cataract    Family history of colon cancer    Gastrostomy in place [facility])    LUQ - placed 10/16/2021   Hypertension    Ileostomy, has currently (HCC)    placed 09/2021   SUI (stress urinary incontinence, female)    Thyroid disease    Urinary urgency    Past Surgical History:  Procedure Laterality Date   BREAST LUMPECTOMY  1999   Chemo and radiation on right breast   IR IMAGING GUIDED PORT INSERTION  11/11/2021   LAPAROSCOPY N/A 10/16/2021   Procedure: LAPAROSCOPIC  DIVERTING OSTOMY, BIOSPY,  G-TUBE PLACEMENT;  Surgeon: Karie Soda, MD;  Location: [facility];  Service: General;  Laterality: N/A;   TONSILLECTOMY  1957   TOTAL KNEE ARTHROPLASTY  1999 & 2001   Patient Active Problem List   Diagnosis Date Noted   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   Acute renal failure (HCC) 05/30/2022   Non-recurrent unilateral inguinal hernia without obstruction or gangrene 04/13/2022   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   Non-recurrent bilateral inguinal hernia without obstruction or gangrene 04/06/2022   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   Genetic testing 12/03/2021   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   Nausea & vomiting 11/06/2021   Hyponatremia 11/06/2021   Family history of colon cancer 11/05/2021   ARF (acute renal failure) (HCC) 11/05/2021   Irritant contact dermatitis associated with digestive stoma    Gastrostomy tube dysfunction (HCC)    Irritant contact dermatitis associated with fecal stoma    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 10/17/2021   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) 10/17/2021   Ileostomy care (HCC) 10/17/2021   Colonic mass 10/15/2021   History of breast cancer 12/30/2011   Leukopenia 04/09/2011   Anxiety state 04/06/2007   Esophageal reflux 04/06/2007   Essential hypertension 04/06/2007   Hyperlipidemia 04/06/2007   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 Pt feels she is able to get out of chair easier now and walk further.   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 No: NPRS scale: 0/10 Pain location: R hip Pain description: aching  Aggravating factors: "when I move my leg the wrong way it makes it hurt in my back"  (demonstrates marching) Relieving factors: not doing the motion that hurts   PRECAUTIONS: None  WEIGHT BEARING RESTRICTIONS: No  FALLS:  Has patient fallen in last 6 months? No  LIVING ENVIRONMENT: Lives with: lives with their spouse Lives in: House/apartment, has basement  (usually goes  down there 1x/month)  Stairs: yes , 6 steps to enter with step to pattern  Has following equipment at home: Single point cane, walker, shower grab bars   OCCUPATION: N/A Leisure: reading, computer games   PLOF: Independent with basic ADLs  PATIENT GOALS: getting her strength back,    OBJECTIVE:   DIAGNOSTIC FINDINGS: no relevant MSK imaging  PATIENT SURVEYS:  FOTO 41 52 @ DC 8 pts MCII  FOTO 7/18 47 FOTO 7/30: 42   LOWER EXTREMITY MMT:  MMT Right 7/18 Left 7/18  Hip flexion 4/5 4/5  Hip extension    Hip abduction 4/5 4/5  Hip adduction 4/5 4/5  Hip internal rotation    Hip external rotation    Knee flexion 4-/5 4-/5  Knee extension 4/5 4/5  Ankle dorsiflexion    Ankle plantarflexion 4/5 4/5  Ankle inversion    Ankle eversion     (Blank rows = not tested)  FUNCTIONAL TESTS:  5 times sit to stand: 15.2s  Timed up and go (TUG): 14.3s  7/18 5XSTS 14.5s   7/18 TUG 12.9s  4 stage balance: passed NBOS, semi tandem, and tandem with R in rear, unable to perform SLS >2s  GAIT: Distance walked: 56ft Assistive device utilized: Single point cane Level of assistance: Complete Independence Comments: shortened step length, shuffling gait, low foot clearance   TODAY'S TREATMENT:                                                                                                                              DATE:   8/1  Nustep L4x8 minutes UE and LE  White leg press 70lbs 4x4 YTB paloff 2x10 each side 6" box step ups 2x10 each HR/TR 20x  7/30  Nustep L4x8 minutes UE and LE  All standing exercise (only sit as needed due to back discomfort, working on standing tolerance)   NBOS with head nods/turns 2x30 sec ea-standing on airex FT on airex with cone reach across body x10ea SBA Mini squat at rail 2x10 Standing HR 2x15 Standing march 2x20 Gait with SPC 237 ft Sidestepping at rail YTB at knees 3x  Step ups 4" L LE 2x10, 6" RLE 2x10  Sit to stands from  chair- 2x10  7/24  Nustep L4x8 minutes UE and LE  All standing exercise (only sit as needed due  to back discomfort, working on standing tolerance)   Tandem reach outside BOS 30sec ea position NBOS with head nods/turns 2x30 sec ea-standing on airex Mini squat at rail 2x10 Standing HR 2x20 Standing march 2x20 Gait with SPC 237 ft Sidestepping at rail YTB at knees 3x  Step ups 4" L LE 2x10, 6" RLE 2x10  Sit to stands from chair- 2x10  PATIENT EDUCATION:  Education details: anatomy, exercise progression, DOMS expectations, envelope of function, HEP, POC  Person educated: Patient Education method: Explanation, Demonstration, Tactile cues, Verbal cues, and Handouts Education comprehension: verbalized understanding, returned demonstration, verbal cues required, and tactile cues required  HOME EXERCISE PROGRAM: Access Code: [email or web address] URL: [email or web address] Date: 08/11/2022 Prepared by: Zebedee Iba  Updated 7/16  ASSESSMENT:  CLINICAL IMPRESSION: Patient exercise session progressed in intensity with focus on lower extremity strength.  High intensity low rep exercise required significant rest breaks between sets with patient reported significant fatigue following.  No pain noted to the right hip or low back during.  However, patient does appear to be more unstable with single-leg step up exercise due to muscle weakness and fatigue.  Patient required longer seated rest breaks between exercises.  Plan to assess for response at next session with continued focus on lower extremity strength and balance.  Patient does report she does have improved ambulation endurance at this time.  Pt will continue to benefit from strengthening, balance, and endurance based exercises to prevent further functional decline.  OBJECTIVE IMPAIRMENTS: Abnormal gait, decreased activity tolerance, decreased balance, decreased endurance, decreased knowledge of use of DME, decreased mobility, difficulty  walking, decreased ROM, decreased strength, hypomobility, impaired flexibility, improper body mechanics, postural dysfunction, and pain.    ACTIVITY LIMITATIONS: carrying, lifting, bending, standing, squatting, stairs, transfers, bed mobility, and locomotion level   PARTICIPATION LIMITATIONS: meal prep, cleaning, laundry, driving, shopping, and community activity and exercise   PERSONAL FACTORS: Age, Fitness, Time since onset of injury/illness/exacerbation, and 1-2 comorbidities:    are also affecting patient's functional outcome.    REHAB POTENTIAL: Fair     CLINICAL DECISION MAKING: Evolving/moderate complexity   EVALUATION COMPLEXITY: Moderate     GOALS:     SHORT TERM GOALS: Target date: 09/22/2022    Pt will become independent with HEP in order to demonstrate synthesis of PT education.    Goal status: met   2.  Pt will be able to perform 5XSTS in under 12s  in order to demonstrate functional improvement above the cut off score for adults.   Goal status: ongoing   3.  Pt will be able to demonstrate ability to perform gait with upright posture and AD  in order to demonstrate functional improvement in LE function for safety with community ambulation    Goal status: met       LONG TERM GOALS: Target date: 11/03/2022      Pt will be able to demonstrate TUG in under 10 sec in order to demonstrate functional improvement in LE function, strength, balance, and mobility for safety with community ambulation.   Goal status: ongoing   Pt will decrease 5XSTS by at least 3 seconds in order to demonstrate clinically significant improvement in LE strength    Goal status: ongoing   3.  Pt will be able to demonstrate/report ability to walk >10 mins in order to demonstrate functional improvement and tolerance to exercise and community mobility.    Goal status: MET   4.  Pt will score >/= 57 on FOTO to demonstrate improvement  in perceived mobility and function.    Goal status:  ongoing        PLAN:   PT FREQUENCY: 1-2x/week   PT DURATION: 12 weeks   PLANNED INTERVENTIONS: Therapeutic exercises, Therapeutic activity, Neuromuscular re-education, Balance training, Gait training, Patient/Family education, Self Care, Joint mobilization, Joint manipulation, Stair training, Prosthetic training, DME instructions, Aquatic Therapy, Dry Needling, Electrical stimulation, Spinal manipulation, Spinal mobilization, Moist heat, scar mobilization, Splintting, Taping, Vasopneumatic device, Traction, Ultrasound, Ionotophoresis 4mg /ml Dexamethasone, Manual therapy, and Re-evaluation   PLAN FOR NEXT SESSION: LE exercise for transfers/transfer mechanics, consider resistance circuits, standing balance exercise, functional activity tolerance   Zebedee Iba PT, DPT 09/23/22 3:27 PM

## 2022-09-24 ENCOUNTER — Ambulatory Visit: Payer: Medicare HMO

## 2022-09-24 ENCOUNTER — Ambulatory Visit: Payer: Medicare HMO | Admitting: Oncology

## 2022-09-24 ENCOUNTER — Inpatient Hospital Stay: Payer: Medicare HMO

## 2022-09-24 ENCOUNTER — Inpatient Hospital Stay: Payer: Medicare HMO | Admitting: Oncology

## 2022-09-24 ENCOUNTER — Encounter: Payer: Self-pay | Admitting: *Deleted

## 2022-09-24 ENCOUNTER — Inpatient Hospital Stay: Payer: Medicare HMO | Attending: Oncology

## 2022-09-24 ENCOUNTER — Other Ambulatory Visit: Payer: Medicare HMO

## 2022-09-24 VITALS — BP 143/77 | HR 92 | Temp 98.2°F | Resp 18 | Ht 59.0 in | Wt 116.2 lb

## 2022-09-24 VITALS — BP 147/71 | HR 81 | Resp 18

## 2022-09-24 DIAGNOSIS — Z5112 Encounter for antineoplastic immunotherapy: Secondary | ICD-10-CM | POA: Diagnosis not present

## 2022-09-24 DIAGNOSIS — C481 Malignant neoplasm of specified parts of peritoneum: Secondary | ICD-10-CM

## 2022-09-24 DIAGNOSIS — Z95828 Presence of other vascular implants and grafts: Secondary | ICD-10-CM

## 2022-09-24 LAB — CMP (CANCER CENTER ONLY)
ALT: 24 U/L (ref 0–44)
AST: 27 U/L (ref 15–41)
Albumin: 4.2 g/dL (ref 3.5–5.0)
Alkaline Phosphatase: 47 U/L (ref 38–126)
Anion gap: 9 (ref 5–15)
BUN: 24 mg/dL — ABNORMAL HIGH (ref 8–23)
CO2: 27 mmol/L (ref 22–32)
Calcium: 9.9 mg/dL (ref 8.9–10.3)
Chloride: 107 mmol/L (ref 98–111)
Creatinine: 0.81 mg/dL (ref 0.44–1.00)
GFR, Estimated: >60 mL/min (ref >60)
Glucose, Bld: 103 mg/dL — ABNORMAL HIGH (ref 70–99)
Potassium: 3.8 mmol/L (ref 3.5–5.1)
Sodium: 143 mmol/L (ref 135–145)
Total Bilirubin: 0.4 mg/dL (ref 0.3–1.2)
Total Protein: 7 g/dL (ref 6.5–8.1)

## 2022-09-24 LAB — CBC WITH DIFFERENTIAL (CANCER CENTER ONLY)
Abs Immature Granulocytes: 0.01 10*3/uL (ref 0.00–0.07)
Basophils Absolute: 0.1 10*3/uL (ref 0.0–0.1)
Basophils Relative: 1 %
Eosinophils Absolute: 0.2 10*3/uL (ref 0.0–0.5)
Eosinophils Relative: 4 %
HCT: 40.8 % (ref 36.0–46.0)
Hemoglobin: 13.2 g/dL (ref 12.0–15.0)
Immature Granulocytes: 0 %
Lymphocytes Relative: 23 %
Lymphs Abs: 1 10*3/uL (ref 0.7–4.0)
MCH: 34.5 pg — ABNORMAL HIGH (ref 26.0–34.0)
MCHC: 32.4 g/dL (ref 30.0–36.0)
MCV: 106.5 fL — ABNORMAL HIGH (ref 80.0–100.0)
Monocytes Absolute: 0.4 10*3/uL (ref 0.1–1.0)
Monocytes Relative: 8 %
Neutro Abs: 2.8 10*3/uL (ref 1.7–7.7)
Neutrophils Relative %: 64 %
Platelet Count: 192 10*3/uL (ref 150–400)
RBC: 3.83 MIL/uL — ABNORMAL LOW (ref 3.87–5.11)
RDW: 13.9 % (ref 11.5–15.5)
WBC Count: 4.4 10*3/uL (ref 4.0–10.5)
nRBC: 0 % (ref 0.0–0.2)

## 2022-09-24 MED ORDER — HEPARIN SOD (PORK) LOCK FLUSH 100 UNIT/ML IV SOLN
500.0000 [IU] | Freq: Once | INTRAVENOUS | Status: AC | PRN
  Administered 2022-09-24: 500 [IU]

## 2022-09-24 MED ORDER — SODIUM CHLORIDE 0.9 % IV SOLN: Freq: Once | INTRAVENOUS | Status: AC

## 2022-09-24 MED ORDER — SODIUM CHLORIDE 0.9 % IV SOLN
15.0000 mg/kg | Freq: Once | INTRAVENOUS | Status: AC
  Administered 2022-09-24: 800 mg via INTRAVENOUS
  Filled 2022-09-24: qty 32

## 2022-09-24 MED ORDER — SODIUM CHLORIDE 0.9% FLUSH
10.0000 mL | INTRAVENOUS | Status: DC | PRN
  Administered 2022-09-24: 10 mL

## 2022-09-24 MED ORDER — ALTEPLASE 2 MG IJ SOLR
2.0000 mg | Freq: Once | INTRAMUSCULAR | Status: AC
  Administered 2022-09-24: 2 mg
  Filled 2022-09-24: qty 2

## 2022-09-24 NOTE — Progress Notes (Signed)
Nancy Harrison OFFICE PROGRESS NOTE   Diagnosis: Peritoneal carcinoma  INTERVAL HISTORY:   Nancy Harrison returns as scheduled.  She completed another treatment with bevacizumab on 09/02/2022.  She continues olaparib.  No symptom of thrombosis.  She has chronic left lower back and leg pain.  The colostomy is functioning well.  Objective:  Vital signs in last 24 hours:  Blood pressure (!) 143/77, pulse 92, temperature 98.2 F (36.8 C), temperature source Oral, resp. rate 18, height 4\' 11"  (1.499 m), weight 116 lb 3.2 oz (52.7 kg), SpO2 96%.    HEENT: No thrush or ulcers Resp: Lungs clear bilaterally Cardio: Regular rate and rhythm GI: No hepatosplenomegaly, right abdomen ileostomy, no mass, nontender Vascular: No leg edema    Portacath/PICC-without erythema  Lab Results:  Lab Results  Component Value Date   WBC 4.4 09/24/2022   HGB 13.2 09/24/2022   HCT 40.8 09/24/2022   MCV 106.5 (H) 09/24/2022   PLT 192 09/24/2022   NEUTROABS 2.8 09/24/2022    CMP  Lab Results  Component Value Date   NA 141 09/02/2022   K 3.8 09/02/2022   CL 107 09/02/2022   CO2 26 09/02/2022   GLUCOSE 108 (H) 09/02/2022   BUN 25 (H) 09/02/2022   CREATININE 0.81 09/02/2022   CALCIUM 10.0 09/02/2022   PROT 6.8 09/02/2022   ALBUMIN 4.3 09/02/2022   AST 23 09/02/2022   ALT 18 09/02/2022   ALKPHOS 44 09/02/2022   BILITOT 0.5 09/02/2022   GFRNONAA >60 09/02/2022    Lab Results  Component Value Date   CEA1 2.5 10/18/2021     Medications: I have reviewed the patient's current medications.   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  Bone lesions concerning for metastases, right lung  nodule Laparoscopy, diverting loop ileostomy, gastrostomy tube placement, peritoneal biopsy 10/16/2021, no evidence of a primary tumor site at the appendix, ovaries, or uterus.  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  CDX2, CK20, and GATA3 negative.  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 Foundation 1-MSS, tumor mutation burden 5, HRD positive-LOH score 34.1% 08/11/2021 CA125 818 CT chest 11/03/2021-10 mm right lower lobe nodule, scattered tiny pulmonary nodules, peritoneal carcinomatosis Cycle 1 Taxol/carboplatin 11/12/2021 Cycle 2 Taxol/carboplatin 12/03/2021 Cycle 3 Taxol/carboplatin 12/24/2021 Bone scan 01/05/2022-negative for metastatic disease Cycle 4 Taxol/carboplatin 01/13/2022 CTs 01/22/2022-reduction in peritoneal carcinomatosis, no evidence of progressive disease stable bilateral lower lobe pulmonary nodules Cycle 5 Taxol/carboplatin 02/03/2022 Cycle 6 Taxol/carboplatin 02/24/2022 CT abdomen/pelvis 04/19/2022-slight decrease in peritoneal carcinomatosis with residual lesions 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   Abdominal distention/pain and diarrhea secondary to #1 3.   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  Lumpectomy and adjuvant CMF x8 followed by radiation 4. Hypertension 5. Hypothyroidism 6. Barrett's esophagus 7. Family history of multiple cancers including  appendix, colon, and lung cancer 8.  Hospital  admission 11/05/2021 with dehydration/prerenal azotemia secondary to nausea and high output ileostomy 9.  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 Admission 05/29/2022 with dehydration and acute renal injury        Disposition: Nancy Harrison appears stable.  I reviewed the restaging CT findings and images with her.  The CA125 is normal.  She is tolerating the olaparib and bevacizumab well.  There is no clinical or radiologic evidence of disease progression.  The plan is to continue olaparib and bevacizumab.  She will complete another treatment with bevacizumab today.  She will return for an office visit and bevacizumab in 3 weeks.  Thornton Papas, MD  09/24/2022  10:41 AM

## 2022-09-24 NOTE — Progress Notes (Signed)
Patient seen by Dr. Truett Perna today  Vitals are within treatment parameters.OK to proceed w/BP 143/77  Labs reviewed by Dr. Truett Perna and are within treatment parameters.Per Dr. Truett Perna: OK to proceed without urine protein results. Patient has been reminded again to bring sample with her for next tx  Per physician team, patient is ready for treatment and there are NO modifications to the treatment plan.

## 2022-09-24 NOTE — Patient Instructions (Signed)
[facility]   Discharge Instructions: Thank you for choosing [facility] to provide your oncology and hematology care.   If you have a lab appointment with the Cancer Center, please go directly to the Cancer Center and check in at the registration area.   Wear comfortable clothing and clothing appropriate for easy access to any Portacath or PICC line.   We strive to give you quality time with your provider. You may need to reschedule your appointment if you arrive late (15 or more minutes).  Arriving late affects you and other patients whose appointments are after yours.  Also, if you miss three or more appointments without notifying the office, you may be dismissed from the clinic at the provider's discretion.      For prescription refill requests, have your pharmacy contact our office and allow 72 hours for refills to be completed.    Today you received the following chemotherapy and/or immunotherapy agents Bevacizumab-bvzr (ZIRABEV).      To help prevent nausea and vomiting after your treatment, we encourage you to take your nausea medication as directed.  BELOW ARE SYMPTOMS THAT SHOULD BE REPORTED IMMEDIATELY: *FEVER GREATER THAN 100.4 F (38 C) OR HIGHER *CHILLS OR SWEATING *NAUSEA AND VOMITING THAT IS NOT CONTROLLED WITH YOUR NAUSEA MEDICATION *UNUSUAL SHORTNESS OF BREATH *UNUSUAL BRUISING OR BLEEDING *URINARY PROBLEMS (pain or burning when urinating, or frequent urination) *BOWEL PROBLEMS (unusual diarrhea, constipation, pain near the anus) TENDERNESS IN MOUTH AND THROAT WITH OR WITHOUT PRESENCE OF ULCERS (sore throat, sores in mouth, or a toothache) UNUSUAL RASH, SWELLING OR PAIN  UNUSUAL VAGINAL DISCHARGE OR ITCHING   Items with * indicate a potential emergency and should be followed up as soon as possible or go to the Emergency Department if any problems should occur.  Please show the CHEMOTHERAPY ALERT CARD or IMMUNOTHERAPY  ALERT CARD at check-in to the Emergency Department and triage nurse.  Should you have questions after your visit or need to cancel or reschedule your appointment, please contact [facility]  Dept: [phone]  and follow the prompts.  Office hours are 8:00 a.m. to 4:30 p.m. Monday - Friday. Please note that voicemails left after 4:00 p.m. may not be returned until the following business day.  We are closed weekends and major holidays. You have access to a nurse at all times for urgent questions. Please call the main number to the clinic Dept: [phone] and follow the prompts.   For any non-urgent questions, you may also contact your provider using MyChart. We now offer e-Visits for anyone 23 and older to request care online for non-urgent symptoms. For details visit [email or web address].   Also download the MyChart app! Go to the app store, search "MyChart", open the app, select , and log in with your MyChart username and password.  Bevacizumab Injection What is this medication? BEVACIZUMAB (be va SIZ yoo mab) treats some types of cancer. It works by blocking a protein that causes cancer cells to grow and multiply. This helps to slow or stop the spread of cancer cells. It is a monoclonal antibody. This medicine may be used for other purposes; ask your health care provider or pharmacist if you have questions. COMMON BRAND NAME(S): Alymsys, Avastin, MVASI, Omer Jack What should I tell my care team before I take this medication? They need to know if you have any of these conditions: Blood clots Coughing up blood Having or recent surgery Heart  failure High blood pressure History of a connection between 2 or more body parts that do not usually connect (fistula) History of a tear in your stomach or intestines Protein in your urine An unusual or allergic reaction to bevacizumab, other medications, foods, dyes, or preservatives Pregnant or trying to  get pregnant Breast-feeding How should I use this medication? This medication is injected into a vein. It is given by your care team in a hospital or clinic setting. Talk to your care team the use of this medication in children. Special care may be needed. Overdosage: If you think you have taken too much of this medicine contact a poison control center or emergency room at once. NOTE: This medicine is only for you. Do not share this medicine with others. What if I miss a dose? Keep appointments for follow-up doses. It is important not to miss your dose. Call your care team if you are unable to keep an appointment. What may interact with this medication? Interactions are not expected. This list may not describe all possible interactions. Give your health care provider a list of all the medicines, herbs, non-prescription drugs, or dietary supplements you use. Also tell them if you smoke, drink alcohol, or use illegal drugs. Some items may interact with your medicine. What should I watch for while using this medication? Your condition will be monitored carefully while you are receiving this medication. You may need blood work while taking this medication. This medication may make you feel generally unwell. This is not uncommon as chemotherapy can affect healthy cells as well as cancer cells. Report any side effects. Continue your course of treatment even though you feel ill unless your care team tells you to stop. This medication may increase your risk to bruise or bleed. Call your care team if you notice any unusual bleeding. Before having surgery, talk to your care team to make sure it is ok. This medication can increase the risk of poor healing of your surgical site or wound. You will need to stop this medication for 28 days before surgery. After surgery, wait at least 28 days before restarting this medication. Make sure the surgical site or wound is healed enough before restarting this medication.  Talk to your care team if questions. Talk to your care team if you may be pregnant. Serious birth defects can occur if you take this medication during pregnancy and for 6 months after the last dose. Contraception is recommended while taking this medication and for 6 months after the last dose. Your care team can help you find the option that works for you. Do not breastfeed while taking this medication and for 6 months after the last dose. This medication can cause infertility. Talk to your care team if you are concerned about your fertility. What side effects may I notice from receiving this medication? Side effects that you should report to your care team as soon as possible: Allergic reactions--skin rash, itching, hives, swelling of the face, lips, tongue, or throat Bleeding--bloody or black, tar-like stools, vomiting blood or brown material that looks like coffee grounds, red or dark brown urine, small red or purple spots on skin, unusual bruising or bleeding Blood clot--pain, swelling, or warmth in the leg, shortness of breath, chest pain Heart attack--pain or tightness in the chest, shoulders, arms, or jaw, nausea, shortness of breath, cold or clammy skin, feeling faint or lightheaded Heart failure--shortness of breath, swelling of the ankles, feet, or hands, sudden weight gain, unusual weakness or  fatigue Increase in blood pressure Infection--fever, chills, cough, sore throat, wounds that don't heal, pain or trouble when passing urine, general feeling of discomfort or being unwell Infusion reactions--chest pain, shortness of breath or trouble breathing, feeling faint or lightheaded Kidney injury--decrease in the amount of urine, swelling of the ankles, hands, or feet Stomach pain that is severe, does not go away, or gets worse Stroke--sudden numbness or weakness of the face, arm, or leg, trouble speaking, confusion, trouble walking, loss of balance or coordination, dizziness, severe headache,  change in vision Sudden and severe headache, confusion, change in vision, seizures, which may be signs of posterior reversible encephalopathy syndrome (PRES) Side effects that usually do not require medical attention (report to your care team if they continue or are bothersome): Back pain Change in taste Diarrhea Dry skin Increased tears Nosebleed This list may not describe all possible side effects. Call your doctor for medical advice about side effects. You may report side effects to FDA at 1-800-FDA-1088. Where should I keep my medication? This medication is given in a hospital or clinic. It will not be stored at home. NOTE: This sheet is a summary. It may not cover all possible information. If you have questions about this medicine, talk to your doctor, pharmacist, or health care provider.  2024 Elsevier/Gold Standard (2021-06-26 00:00:00)

## 2022-09-29 ENCOUNTER — Other Ambulatory Visit: Payer: Self-pay

## 2022-10-01 ENCOUNTER — Other Ambulatory Visit: Payer: Self-pay

## 2022-10-10 ENCOUNTER — Other Ambulatory Visit: Payer: Self-pay | Admitting: Oncology

## 2022-10-10 DIAGNOSIS — C481 Malignant neoplasm of specified parts of peritoneum: Secondary | ICD-10-CM

## 2022-10-15 ENCOUNTER — Inpatient Hospital Stay: Payer: Medicare HMO

## 2022-10-15 ENCOUNTER — Inpatient Hospital Stay: Payer: Medicare HMO | Admitting: Oncology

## 2022-10-15 VITALS — BP 140/78 | HR 87 | Temp 98.2°F | Resp 18 | Ht 59.0 in | Wt 119.0 lb

## 2022-10-15 VITALS — BP 139/73 | HR 88 | Temp 97.7°F | Resp 18

## 2022-10-15 DIAGNOSIS — C481 Malignant neoplasm of specified parts of peritoneum: Secondary | ICD-10-CM

## 2022-10-15 DIAGNOSIS — Z5112 Encounter for antineoplastic immunotherapy: Secondary | ICD-10-CM | POA: Diagnosis not present

## 2022-10-15 DIAGNOSIS — Z95828 Presence of other vascular implants and grafts: Secondary | ICD-10-CM

## 2022-10-15 LAB — CBC WITH DIFFERENTIAL (CANCER CENTER ONLY)
Abs Immature Granulocytes: 0.01 10*3/uL (ref 0.00–0.07)
Basophils Absolute: 0.1 10*3/uL (ref 0.0–0.1)
Basophils Relative: 1 %
Eosinophils Absolute: 0.2 10*3/uL (ref 0.0–0.5)
Eosinophils Relative: 3 %
HCT: 38.3 % (ref 36.0–46.0)
Hemoglobin: 12.6 g/dL (ref 12.0–15.0)
Immature Granulocytes: 0 %
Lymphocytes Relative: 21 %
Lymphs Abs: 1.1 10*3/uL (ref 0.7–4.0)
MCH: 34.3 pg — ABNORMAL HIGH (ref 26.0–34.0)
MCHC: 32.9 g/dL (ref 30.0–36.0)
MCV: 104.4 fL — ABNORMAL HIGH (ref 80.0–100.0)
Monocytes Absolute: 0.4 10*3/uL (ref 0.1–1.0)
Monocytes Relative: 9 %
Neutro Abs: 3.4 10*3/uL (ref 1.7–7.7)
Neutrophils Relative %: 66 %
Platelet Count: 203 10*3/uL (ref 150–400)
RBC: 3.67 MIL/uL — ABNORMAL LOW (ref 3.87–5.11)
RDW: 14 % (ref 11.5–15.5)
WBC Count: 5.1 10*3/uL (ref 4.0–10.5)
nRBC: 0 % (ref 0.0–0.2)

## 2022-10-15 LAB — TOTAL PROTEIN, URINE DIPSTICK: Protein, ur: NEGATIVE mg/dL

## 2022-10-15 MED ORDER — ALTEPLASE 2 MG IJ SOLR
2.0000 mg | Freq: Once | INTRAMUSCULAR | Status: AC
  Administered 2022-10-15: 2 mg
  Filled 2022-10-15: qty 2

## 2022-10-15 MED ORDER — SODIUM CHLORIDE 0.9 % IV SOLN
15.0000 mg/kg | Freq: Once | INTRAVENOUS | Status: AC
  Administered 2022-10-15: 800 mg via INTRAVENOUS
  Filled 2022-10-15: qty 32

## 2022-10-15 MED ORDER — SODIUM CHLORIDE 0.9 % IV SOLN: Freq: Once | INTRAVENOUS | Status: AC

## 2022-10-15 MED ORDER — SODIUM CHLORIDE 0.9% FLUSH
10.0000 mL | INTRAVENOUS | Status: DC | PRN
  Administered 2022-10-15: 10 mL

## 2022-10-15 MED ORDER — HEPARIN SOD (PORK) LOCK FLUSH 100 UNIT/ML IV SOLN
500.0000 [IU] | Freq: Once | INTRAVENOUS | Status: AC | PRN
  Administered 2022-10-15: 500 [IU]

## 2022-10-15 NOTE — Patient Instructions (Signed)
[facility]   Discharge Instructions: Thank you for choosing [facility] to provide your oncology and hematology care.   If you have a lab appointment with the Cancer Center, please go directly to the Cancer Center and check in at the registration area.   Wear comfortable clothing and clothing appropriate for easy access to any Portacath or PICC line.   We strive to give you quality time with your provider. You may need to reschedule your appointment if you arrive late (15 or more minutes).  Arriving late affects you and other patients whose appointments are after yours.  Also, if you miss three or more appointments without notifying the office, you may be dismissed from the clinic at the provider's discretion.      For prescription refill requests, have your pharmacy contact our office and allow 72 hours for refills to be completed.    Today you received the following chemotherapy and/or immunotherapy agents Bevacizumab-bvzr (ZIRABEV).      To help prevent nausea and vomiting after your treatment, we encourage you to take your nausea medication as directed.  BELOW ARE SYMPTOMS THAT SHOULD BE REPORTED IMMEDIATELY: *FEVER GREATER THAN 100.4 F (38 C) OR HIGHER *CHILLS OR SWEATING *NAUSEA AND VOMITING THAT IS NOT CONTROLLED WITH YOUR NAUSEA MEDICATION *UNUSUAL SHORTNESS OF BREATH *UNUSUAL BRUISING OR BLEEDING *URINARY PROBLEMS (pain or burning when urinating, or frequent urination) *BOWEL PROBLEMS (unusual diarrhea, constipation, pain near the anus) TENDERNESS IN MOUTH AND THROAT WITH OR WITHOUT PRESENCE OF ULCERS (sore throat, sores in mouth, or a toothache) UNUSUAL RASH, SWELLING OR PAIN  UNUSUAL VAGINAL DISCHARGE OR ITCHING   Items with * indicate a potential emergency and should be followed up as soon as possible or go to the Emergency Department if any problems should occur.  Please show the CHEMOTHERAPY ALERT CARD or IMMUNOTHERAPY  ALERT CARD at check-in to the Emergency Department and triage nurse.  Should you have questions after your visit or need to cancel or reschedule your appointment, please contact [facility]  Dept: [phone]  and follow the prompts.  Office hours are 8:00 a.m. to 4:30 p.m. Monday - Friday. Please note that voicemails left after 4:00 p.m. may not be returned until the following business day.  We are closed weekends and major holidays. You have access to a nurse at all times for urgent questions. Please call the main number to the clinic Dept: [phone] and follow the prompts.   For any non-urgent questions, you may also contact your provider using MyChart. We now offer e-Visits for anyone 23 and older to request care online for non-urgent symptoms. For details visit [email or web address].   Also download the MyChart app! Go to the app store, search "MyChart", open the app, select , and log in with your MyChart username and password.  Bevacizumab Injection What is this medication? BEVACIZUMAB (be va SIZ yoo mab) treats some types of cancer. It works by blocking a protein that causes cancer cells to grow and multiply. This helps to slow or stop the spread of cancer cells. It is a monoclonal antibody. This medicine may be used for other purposes; ask your health care provider or pharmacist if you have questions. COMMON BRAND NAME(S): Alymsys, Avastin, MVASI, Omer Jack What should I tell my care team before I take this medication? They need to know if you have any of these conditions: Blood clots Coughing up blood Having or recent surgery Heart  failure High blood pressure History of a connection between 2 or more body parts that do not usually connect (fistula) History of a tear in your stomach or intestines Protein in your urine An unusual or allergic reaction to bevacizumab, other medications, foods, dyes, or preservatives Pregnant or trying to  get pregnant Breast-feeding How should I use this medication? This medication is injected into a vein. It is given by your care team in a hospital or clinic setting. Talk to your care team the use of this medication in children. Special care may be needed. Overdosage: If you think you have taken too much of this medicine contact a poison control center or emergency room at once. NOTE: This medicine is only for you. Do not share this medicine with others. What if I miss a dose? Keep appointments for follow-up doses. It is important not to miss your dose. Call your care team if you are unable to keep an appointment. What may interact with this medication? Interactions are not expected. This list may not describe all possible interactions. Give your health care provider a list of all the medicines, herbs, non-prescription drugs, or dietary supplements you use. Also tell them if you smoke, drink alcohol, or use illegal drugs. Some items may interact with your medicine. What should I watch for while using this medication? Your condition will be monitored carefully while you are receiving this medication. You may need blood work while taking this medication. This medication may make you feel generally unwell. This is not uncommon as chemotherapy can affect healthy cells as well as cancer cells. Report any side effects. Continue your course of treatment even though you feel ill unless your care team tells you to stop. This medication may increase your risk to bruise or bleed. Call your care team if you notice any unusual bleeding. Before having surgery, talk to your care team to make sure it is ok. This medication can increase the risk of poor healing of your surgical site or wound. You will need to stop this medication for 28 days before surgery. After surgery, wait at least 28 days before restarting this medication. Make sure the surgical site or wound is healed enough before restarting this medication.  Talk to your care team if questions. Talk to your care team if you may be pregnant. Serious birth defects can occur if you take this medication during pregnancy and for 6 months after the last dose. Contraception is recommended while taking this medication and for 6 months after the last dose. Your care team can help you find the option that works for you. Do not breastfeed while taking this medication and for 6 months after the last dose. This medication can cause infertility. Talk to your care team if you are concerned about your fertility. What side effects may I notice from receiving this medication? Side effects that you should report to your care team as soon as possible: Allergic reactions--skin rash, itching, hives, swelling of the face, lips, tongue, or throat Bleeding--bloody or black, tar-like stools, vomiting blood or brown material that looks like coffee grounds, red or dark brown urine, small red or purple spots on skin, unusual bruising or bleeding Blood clot--pain, swelling, or warmth in the leg, shortness of breath, chest pain Heart attack--pain or tightness in the chest, shoulders, arms, or jaw, nausea, shortness of breath, cold or clammy skin, feeling faint or lightheaded Heart failure--shortness of breath, swelling of the ankles, feet, or hands, sudden weight gain, unusual weakness or  fatigue Increase in blood pressure Infection--fever, chills, cough, sore throat, wounds that don't heal, pain or trouble when passing urine, general feeling of discomfort or being unwell Infusion reactions--chest pain, shortness of breath or trouble breathing, feeling faint or lightheaded Kidney injury--decrease in the amount of urine, swelling of the ankles, hands, or feet Stomach pain that is severe, does not go away, or gets worse Stroke--sudden numbness or weakness of the face, arm, or leg, trouble speaking, confusion, trouble walking, loss of balance or coordination, dizziness, severe headache,  change in vision Sudden and severe headache, confusion, change in vision, seizures, which may be signs of posterior reversible encephalopathy syndrome (PRES) Side effects that usually do not require medical attention (report to your care team if they continue or are bothersome): Back pain Change in taste Diarrhea Dry skin Increased tears Nosebleed This list may not describe all possible side effects. Call your doctor for medical advice about side effects. You may report side effects to FDA at 1-800-FDA-1088. Where should I keep my medication? This medication is given in a hospital or clinic. It will not be stored at home. NOTE: This sheet is a summary. It may not cover all possible information. If you have questions about this medicine, talk to your doctor, pharmacist, or health care provider.  2024 Elsevier/Gold Standard (2021-06-26 00:00:00)

## 2022-10-15 NOTE — Progress Notes (Signed)
[facility] OFFICE PROGRESS NOTE   Diagnosis: Peritoneal carcinoma  INTERVAL HISTORY:   Nancy Harrison returns as scheduled.  She continues olaparib.  She was last treated with bevacizumab on 09/24/2022.  No bleeding or symptom of thrombosis.  No new complaint.  The stool is variably formed.  She takes Imodium and Lomotil several times per day.  Objective:  Vital signs in last 24 hours:  Blood pressure (!) 140/78, pulse 87, temperature 98.2 F (36.8 C), temperature source Oral, resp. rate 18, height 4\' 11"  (1.499 m), weight 119 lb (54 kg), SpO2 100%.    HEENT: No thrush or ulcers Resp: Lungs clear bilaterally Cardio: Regular rate and rhythm GI: Right abdomen ostomy with semiformed brown stool, no hepatosplenomegaly, no apparent ascites Vascular: No leg edema  Portacath/PICC-without erythema  Lab Results:  Lab Results  Component Value Date   WBC 4.4 09/24/2022   HGB 13.2 09/24/2022   HCT 40.8 09/24/2022   MCV 106.5 (H) 09/24/2022   PLT 192 09/24/2022   NEUTROABS 2.8 09/24/2022    CMP  Lab Results  Component Value Date   NA 143 09/24/2022   K 3.8 09/24/2022   CL 107 09/24/2022   CO2 27 09/24/2022   GLUCOSE 103 (H) 09/24/2022   BUN 24 (H) 09/24/2022   CREATININE 0.81 09/24/2022   CALCIUM 9.9 09/24/2022   PROT 7.0 09/24/2022   ALBUMIN 4.2 09/24/2022   AST 27 09/24/2022   ALT 24 09/24/2022   ALKPHOS 47 09/24/2022   BILITOT 0.4 09/24/2022   GFRNONAA >60 09/24/2022    Lab Results  Component Value Date   CEA1 2.5 10/18/2021    Lab Results  Component Value Date   INR 1.0 05/29/2022   LABPROT 13.0 05/29/2022    Imaging:  No results found.  Medications: I have reviewed the patient's current medications.   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  Bone lesions concerning for metastases, right lung nodule Laparoscopy, diverting loop ileostomy, gastrostomy tube placement, peritoneal biopsy 10/16/2021, no evidence of a primary tumor site at the appendix, ovaries, or uterus.  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  CDX2, CK20, and GATA3 negative.  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 Foundation 1-MSS, tumor mutation burden 5, HRD positive-LOH score 34.1% 08/11/2021 CA125 818 CT chest 11/03/2021-10 mm right lower lobe nodule, scattered tiny pulmonary nodules, peritoneal carcinomatosis Cycle 1 Taxol/carboplatin 11/12/2021 Cycle 2 Taxol/carboplatin 12/03/2021 Cycle 3 Taxol/carboplatin 12/24/2021 Bone scan 01/05/2022-negative for metastatic disease Cycle 4 Taxol/carboplatin 01/13/2022 CTs 01/22/2022-reduction in peritoneal carcinomatosis, no evidence of progressive disease stable bilateral lower lobe pulmonary nodules Cycle 5 Taxol/carboplatin 02/03/2022 Cycle 6 Taxol/carboplatin 02/24/2022 CT abdomen/pelvis 04/19/2022-slight decrease in peritoneal carcinomatosis with residual lesions 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   Abdominal distention/pain and diarrhea secondary to #1 3.   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  Lumpectomy  and  adjuvant CMF x8 followed by radiation 4. Hypertension 5. Hypothyroidism 6. Barrett's esophagus 7. Family history of multiple cancers including appendix, colon, and lung cancer 8.  Hospital admission 11/05/2021 with dehydration/prerenal azotemia secondary to nausea and high output ileostomy 9.  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 Admission 05/29/2022 with dehydration and acute renal injury         Disposition: Mr Falter appears stable.  She is tolerating the bevacizumab and Olaparib well.  She will complete another treatment with bevacizumab today.  We will follow-up on the CA125 from today. Mr Bowerman will return for an office visit and bevacizumab in 3 weeks.  We will plan for a restaging CT abdomen/pelvis in November.  Thornton Papas, MD  10/15/2022  9:25 AM

## 2022-10-16 LAB — CA 125: Cancer Antigen (CA) 125: 24.2 U/mL (ref 0.0–38.1)

## 2022-10-17 ENCOUNTER — Other Ambulatory Visit: Payer: Self-pay | Admitting: Oncology

## 2022-10-19 ENCOUNTER — Other Ambulatory Visit: Payer: Self-pay

## 2022-10-20 ENCOUNTER — Encounter: Payer: Self-pay | Admitting: *Deleted

## 2022-10-20 ENCOUNTER — Other Ambulatory Visit: Payer: Self-pay | Admitting: Oncology

## 2022-10-27 DIAGNOSIS — Z932 Ileostomy status: Secondary | ICD-10-CM | POA: Diagnosis not present

## 2022-10-29 ENCOUNTER — Other Ambulatory Visit: Payer: Self-pay

## 2022-11-04 ENCOUNTER — Ambulatory Visit (HOSPITAL_BASED_OUTPATIENT_CLINIC_OR_DEPARTMENT_OTHER): Payer: Medicare HMO | Attending: Oncology

## 2022-11-04 ENCOUNTER — Encounter (HOSPITAL_BASED_OUTPATIENT_CLINIC_OR_DEPARTMENT_OTHER): Payer: Self-pay

## 2022-11-04 DIAGNOSIS — M6281 Muscle weakness (generalized): Secondary | ICD-10-CM | POA: Diagnosis not present

## 2022-11-04 DIAGNOSIS — R262 Difficulty in walking, not elsewhere classified: Secondary | ICD-10-CM | POA: Insufficient documentation

## 2022-11-04 NOTE — Therapy (Signed)
OUTPATIENT PHYSICAL THERAPY LOWER EXTREMITY TREATMENT     Patient Name: Jora Logan MRN: [id number] DOB:1940-08-26, 82 y.o., female Today's Date: 11/04/2022  END OF SESSION:  PT End of Session - 11/04/22 1519     Visit Number 14    Number of Visits 18    Date for PT Re-Evaluation 11/09/22    Authorization Type [employer] MCR    Progress Note Due on Visit 19    PT Start Time 1435    PT Stop Time 1507    PT Time Calculation (min) 32 min    Equipment Utilized During Treatment Gait belt    Activity Tolerance Patient tolerated treatment well    Behavior During Therapy WFL for tasks assessed/performed                      Past Medical History:  Diagnosis Date   Arthritis    Barrett's esophagus    Path 10/2001   Breast cancer (HCC) 01/01/2011   Cancer (HCC) 1999   rt breast   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   Cataract    Family history of colon cancer    Gastrostomy in place [facility])    LUQ - placed 10/16/2021   Hypertension    Ileostomy, has currently (HCC)    placed 09/2021   SUI (stress urinary incontinence, female)    Thyroid disease    Urinary urgency    Past Surgical History:  Procedure Laterality Date   BREAST LUMPECTOMY  1999   Chemo and radiation on right breast   IR IMAGING GUIDED PORT INSERTION  11/11/2021   LAPAROSCOPY N/A 10/16/2021   Procedure: LAPAROSCOPIC  DIVERTING OSTOMY, BIOSPY,  G-TUBE PLACEMENT;  Surgeon: Karie Soda, MD;  Location: [facility];  Service: General;  Laterality: N/A;   TONSILLECTOMY  1957   TOTAL KNEE ARTHROPLASTY  1999 & 2001   Patient Active Problem List   Diagnosis Date Noted   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   Acute renal failure (HCC) 05/30/2022   Non-recurrent unilateral inguinal hernia without obstruction or gangrene 04/13/2022   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   Non-recurrent bilateral inguinal hernia without obstruction or gangrene 04/06/2022   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   Genetic  testing 12/03/2021   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   Nausea & vomiting 11/06/2021   Hyponatremia 11/06/2021   Family history of colon cancer 11/05/2021   ARF (acute renal failure) (HCC) 11/05/2021   Irritant contact dermatitis associated with digestive stoma    Gastrostomy tube dysfunction (HCC)    Irritant contact dermatitis associated with fecal stoma    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 10/17/2021   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) 10/17/2021   Ileostomy care (HCC) 10/17/2021   Colonic mass 10/15/2021   History of breast cancer 12/30/2011   Leukopenia 04/09/2011   Anxiety state 04/06/2007   Esophageal reflux 04/06/2007   Essential hypertension 04/06/2007   Hyperlipidemia 04/06/2007   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 She reports she has not bene keeping up with exercises and feels weaker.  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 No: NPRS scale: 0/10 Pain location: R hip Pain description: aching  Aggravating factors: "when I move my leg the wrong way it makes it hurt in my back"  (demonstrates marching) Relieving factors: not doing the motion that hurts   PRECAUTIONS: None  WEIGHT BEARING RESTRICTIONS: No  FALLS:  Has patient fallen in last 6 months? No  LIVING ENVIRONMENT: Lives with: lives with  their spouse Lives in: House/apartment, has basement  (usually goes down there 1x/month)  Stairs: yes , 6 steps to enter with step to pattern  Has following equipment at home: Single point cane, walker, shower grab bars   OCCUPATION: N/A Leisure: reading, computer games   PLOF: Independent with basic ADLs  PATIENT GOALS: getting her strength back,    OBJECTIVE:   DIAGNOSTIC FINDINGS: no relevant MSK imaging  PATIENT SURVEYS:  FOTO 41 52 @ DC 8 pts MCII  FOTO 7/18 47 FOTO 7/30: 42   LOWER EXTREMITY MMT:  MMT Right 7/18 Left 7/18  Hip flexion 4/5 4/5  Hip extension    Hip abduction 4/5 4/5  Hip adduction 4/5 4/5  Hip internal rotation    Hip external rotation    Knee flexion 4-/5 4-/5  Knee extension 4/5 4/5  Ankle dorsiflexion    Ankle plantarflexion 4/5 4/5  Ankle inversion    Ankle eversion     (Blank rows = not tested)  FUNCTIONAL TESTS:  5 times sit to stand: 15.2s  Timed up and go (TUG): 14.3s  7/18 5XSTS 14.5s   7/18 TUG 12.9s  4 stage balance: passed NBOS, semi tandem, and tandem with R in rear, unable to perform SLS >2s  GAIT: Distance walked: 57ft Assistive device utilized: Single point cane Level of assistance: Complete Independence Comments: shortened step length, shuffling gait, low foot clearance   TODAY'S TREATMENT:                                                                                                                              DATE:   9/12  Nustep L5x8 minutes UE and LE  6" box step ups x10 each HR/TR 20x Mini squat at rail 2x10 Gait with SPC 1 lap x2 (276ft)  8/1  Nustep L4x8 minutes UE and LE  White leg press 70lbs 4x4 YTB paloff 2x10 each side 6" box step ups 2x10 each HR/TR 20x  7/30  Nustep L4x8 minutes UE and LE  All standing exercise (only sit as needed due to back discomfort, working on standing tolerance)   NBOS with head nods/turns 2x30 sec ea-standing on airex FT on airex with cone reach  across body x10ea SBA Mini squat at rail 2x10 Standing HR 2x15 Standing march 2x20 Gait with SPC 237 ft Sidestepping at rail  YTB at knees 3x  Step ups 4" L LE 2x10, 6" RLE 2x10  Sit to stands from chair- 2x10  7/24  Nustep L4x8 minutes UE and LE  All standing exercise (only sit as needed due to back discomfort, working on standing tolerance)   Tandem reach outside BOS 30sec ea position NBOS with head nods/turns 2x30 sec ea-standing on airex Mini squat at rail 2x10 Standing HR 2x20 Standing march 2x20 Gait with SPC 237 ft Sidestepping at rail YTB at knees 3x  Step ups 4" L LE 2x10, 6" RLE 2x10  Sit to stands from chair- 2x10  PATIENT EDUCATION:  Education details: anatomy, exercise progression, DOMS expectations, envelope of function, HEP, POC  Person educated: Patient Education method: Explanation, Demonstration, Tactile cues, Verbal cues, and Handouts Education comprehension: verbalized understanding, returned demonstration, verbal cues required, and tactile cues required  HOME EXERCISE PROGRAM: Access Code: [email or web address] URL: [email or web address] Date: 08/11/2022 Prepared by: Zebedee Iba  Updated 7/16  ASSESSMENT:  CLINICAL IMPRESSION: Continued to work on building LE strength as pt has decreased with her activity level. Pt able to ambulate for 2 laps around clinic with seated rest break in between, though fatigued after this. Will continue to progress as tolerated.   OBJECTIVE IMPAIRMENTS: Abnormal gait, decreased activity tolerance, decreased balance, decreased endurance, decreased knowledge of use of DME, decreased mobility, difficulty walking, decreased ROM, decreased strength, hypomobility, impaired flexibility, improper body mechanics, postural dysfunction, and pain.    ACTIVITY LIMITATIONS: carrying, lifting, bending, standing, squatting, stairs, transfers, bed mobility, and locomotion level   PARTICIPATION LIMITATIONS: meal prep, cleaning, laundry,  driving, shopping, and community activity and exercise   PERSONAL FACTORS: Age, Fitness, Time since onset of injury/illness/exacerbation, and 1-2 comorbidities:    are also affecting patient's functional outcome.    REHAB POTENTIAL: Fair     CLINICAL DECISION MAKING: Evolving/moderate complexity   EVALUATION COMPLEXITY: Moderate     GOALS:     SHORT TERM GOALS: Target date: 09/22/2022    Pt will become independent with HEP in order to demonstrate synthesis of PT education.    Goal status: met   2.  Pt will be able to perform 5XSTS in under 12s  in order to demonstrate functional improvement above the cut off score for adults.   Goal status: ongoing   3.  Pt will be able to demonstrate ability to perform gait with upright posture and AD  in order to demonstrate functional improvement in LE function for safety with community ambulation    Goal status: met       LONG TERM GOALS: Target date: 11/03/2022      Pt will be able to demonstrate TUG in under 10 sec in order to demonstrate functional improvement in LE function, strength, balance, and mobility for safety with community ambulation.   Goal status: ongoing   Pt will decrease 5XSTS by at least 3 seconds in order to demonstrate clinically significant improvement in LE strength    Goal status: ongoing   3.  Pt will be able to demonstrate/report ability to walk >10 mins in order to demonstrate functional improvement and tolerance to exercise and community mobility.    Goal status: MET   4.  Pt will score >/= 57 on FOTO to demonstrate improvement in perceived mobility and function.    Goal status: ongoing        PLAN:   PT FREQUENCY: 1-2x/week   PT DURATION: 12 weeks   PLANNED INTERVENTIONS: Therapeutic exercises, Therapeutic activity, Neuromuscular re-education,  Balance training, Gait training, Patient/Family education, Self Care, Joint mobilization, Joint manipulation, Stair training, Prosthetic training, DME  instructions, Aquatic Therapy, Dry Needling, Electrical stimulation, Spinal manipulation, Spinal mobilization, Moist heat, scar mobilization, Splintting, Taping, Vasopneumatic device, Traction, Ultrasound, Ionotophoresis 4mg /ml Dexamethasone, Manual therapy, and Re-evaluation   PLAN FOR NEXT SESSION: LE exercise for transfers/transfer mechanics, consider resistance circuits, standing balance exercise, functional activity tolerance   Riki Altes, PTA  11/04/22 3:40 PM

## 2022-11-05 ENCOUNTER — Inpatient Hospital Stay: Payer: Medicare HMO | Attending: Oncology

## 2022-11-05 ENCOUNTER — Inpatient Hospital Stay: Payer: Medicare HMO

## 2022-11-05 ENCOUNTER — Other Ambulatory Visit: Payer: Self-pay | Admitting: *Deleted

## 2022-11-05 ENCOUNTER — Encounter: Payer: Self-pay | Admitting: Nurse Practitioner

## 2022-11-05 ENCOUNTER — Inpatient Hospital Stay (HOSPITAL_BASED_OUTPATIENT_CLINIC_OR_DEPARTMENT_OTHER): Payer: Medicare HMO | Admitting: Nurse Practitioner

## 2022-11-05 VITALS — BP 148/62 | HR 80 | Resp 18

## 2022-11-05 VITALS — BP 136/85 | HR 93 | Temp 98.1°F | Resp 18 | Ht 59.0 in | Wt 119.0 lb

## 2022-11-05 DIAGNOSIS — C481 Malignant neoplasm of specified parts of peritoneum: Secondary | ICD-10-CM | POA: Diagnosis not present

## 2022-11-05 DIAGNOSIS — I10 Essential (primary) hypertension: Secondary | ICD-10-CM | POA: Diagnosis not present

## 2022-11-05 DIAGNOSIS — Z5112 Encounter for antineoplastic immunotherapy: Secondary | ICD-10-CM | POA: Diagnosis not present

## 2022-11-05 LAB — CMP (CANCER CENTER ONLY)
ALT: 16 U/L (ref 0–44)
AST: 24 U/L (ref 15–41)
Albumin: 3.9 g/dL (ref 3.5–5.0)
Alkaline Phosphatase: 49 U/L (ref 38–126)
Anion gap: 8 (ref 5–15)
BUN: 20 mg/dL (ref 8–23)
CO2: 27 mmol/L (ref 22–32)
Calcium: 9.4 mg/dL (ref 8.9–10.3)
Chloride: 106 mmol/L (ref 98–111)
Creatinine: 0.72 mg/dL (ref 0.44–1.00)
GFR, Estimated: >60 mL/min (ref >60)
Glucose, Bld: 104 mg/dL — ABNORMAL HIGH (ref 70–99)
Potassium: 3.7 mmol/L (ref 3.5–5.1)
Sodium: 141 mmol/L (ref 135–145)
Total Bilirubin: 0.5 mg/dL (ref 0.3–1.2)
Total Protein: 6.7 g/dL (ref 6.5–8.1)

## 2022-11-05 LAB — TOTAL PROTEIN, URINE DIPSTICK: Protein, ur: NEGATIVE mg/dL

## 2022-11-05 LAB — CBC WITH DIFFERENTIAL (CANCER CENTER ONLY)
Abs Immature Granulocytes: 0.01 10*3/uL (ref 0.00–0.07)
Basophils Absolute: 0.1 10*3/uL (ref 0.0–0.1)
Basophils Relative: 2 %
Eosinophils Absolute: 0.2 10*3/uL (ref 0.0–0.5)
Eosinophils Relative: 5 %
HCT: 37.9 % (ref 36.0–46.0)
Hemoglobin: 12.3 g/dL (ref 12.0–15.0)
Immature Granulocytes: 0 %
Lymphocytes Relative: 27 %
Lymphs Abs: 1.2 10*3/uL (ref 0.7–4.0)
MCH: 34 pg (ref 26.0–34.0)
MCHC: 32.5 g/dL (ref 30.0–36.0)
MCV: 104.7 fL — ABNORMAL HIGH (ref 80.0–100.0)
Monocytes Absolute: 0.4 10*3/uL (ref 0.1–1.0)
Monocytes Relative: 9 %
Neutro Abs: 2.5 10*3/uL (ref 1.7–7.7)
Neutrophils Relative %: 57 %
Platelet Count: 211 10*3/uL (ref 150–400)
RBC: 3.62 MIL/uL — ABNORMAL LOW (ref 3.87–5.11)
RDW: 14.5 % (ref 11.5–15.5)
WBC Count: 4.3 10*3/uL (ref 4.0–10.5)
nRBC: 0 % (ref 0.0–0.2)

## 2022-11-05 LAB — MAGNESIUM: Magnesium: 1.8 mg/dL (ref 1.7–2.4)

## 2022-11-05 MED ORDER — SODIUM CHLORIDE 0.9 % IV SOLN
15.0000 mg/kg | Freq: Once | INTRAVENOUS | Status: AC
  Administered 2022-11-05: 800 mg via INTRAVENOUS
  Filled 2022-11-05: qty 16

## 2022-11-05 MED ORDER — HEPARIN SOD (PORK) LOCK FLUSH 100 UNIT/ML IV SOLN
500.0000 [IU] | Freq: Once | INTRAVENOUS | Status: AC | PRN
  Administered 2022-11-05: 500 [IU]

## 2022-11-05 MED ORDER — SODIUM CHLORIDE 0.9% FLUSH
10.0000 mL | INTRAVENOUS | Status: DC | PRN
  Administered 2022-11-05: 10 mL

## 2022-11-05 MED ORDER — OLAPARIB 150 MG PO TABS: ORAL_TABLET | ORAL | 2 refills | Status: DC

## 2022-11-05 MED ORDER — SODIUM CHLORIDE 0.9 % IV SOLN: Freq: Once | INTRAVENOUS | Status: AC

## 2022-11-05 NOTE — Patient Instructions (Signed)
[facility]   Discharge Instructions: Thank you for choosing [facility] to provide your oncology and hematology care.   If you have a lab appointment with the Cancer Center, please go directly to the Cancer Center and check in at the registration area.   Wear comfortable clothing and clothing appropriate for easy access to any Portacath or PICC line.   We strive to give you quality time with your provider. You may need to reschedule your appointment if you arrive late (15 or more minutes).  Arriving late affects you and other patients whose appointments are after yours.  Also, if you miss three or more appointments without notifying the office, you may be dismissed from the clinic at the provider's discretion.      For prescription refill requests, have your pharmacy contact our office and allow 72 hours for refills to be completed.    Today you received the following chemotherapy and/or immunotherapy agents Bevacizumab-bvzr (ZIRABEV).      To help prevent nausea and vomiting after your treatment, we encourage you to take your nausea medication as directed.  BELOW ARE SYMPTOMS THAT SHOULD BE REPORTED IMMEDIATELY: *FEVER GREATER THAN 100.4 F (38 C) OR HIGHER *CHILLS OR SWEATING *NAUSEA AND VOMITING THAT IS NOT CONTROLLED WITH YOUR NAUSEA MEDICATION *UNUSUAL SHORTNESS OF BREATH *UNUSUAL BRUISING OR BLEEDING *URINARY PROBLEMS (pain or burning when urinating, or frequent urination) *BOWEL PROBLEMS (unusual diarrhea, constipation, pain near the anus) TENDERNESS IN MOUTH AND THROAT WITH OR WITHOUT PRESENCE OF ULCERS (sore throat, sores in mouth, or a toothache) UNUSUAL RASH, SWELLING OR PAIN  UNUSUAL VAGINAL DISCHARGE OR ITCHING   Items with * indicate a potential emergency and should be followed up as soon as possible or go to the Emergency Department if any problems should occur.  Please show the CHEMOTHERAPY ALERT CARD or IMMUNOTHERAPY  ALERT CARD at check-in to the Emergency Department and triage nurse.  Should you have questions after your visit or need to cancel or reschedule your appointment, please contact [facility]  Dept: [phone]  and follow the prompts.  Office hours are 8:00 a.m. to 4:30 p.m. Monday - Friday. Please note that voicemails left after 4:00 p.m. may not be returned until the following business day.  We are closed weekends and major holidays. You have access to a nurse at all times for urgent questions. Please call the main number to the clinic Dept: [phone] and follow the prompts.   For any non-urgent questions, you may also contact your provider using MyChart. We now offer e-Visits for anyone 63 and older to request care online for non-urgent symptoms. For details visit [email or web address].   Also download the MyChart app! Go to the app store, search "MyChart", open the app, select [place], and log in with your MyChart username and password.  Bevacizumab Injection What is this medication? BEVACIZUMAB (be va SIZ yoo mab) treats some types of cancer. It works by blocking a protein that causes cancer cells to grow and multiply. This helps to slow or stop the spread of cancer cells. It is a monoclonal antibody. This medicine may be used for other purposes; ask your health care provider or pharmacist if you have questions. COMMON BRAND NAME(S): Alymsys, Avastin, MVASI, Omer Jack What should I tell my care team before I take this medication? They need to know if you have any of these conditions: Blood clots Coughing up blood Having or recent surgery Heart  failure High blood pressure History of a connection between 2 or more body parts that do not usually connect (fistula) History of a tear in your stomach or intestines Protein in your urine An unusual or allergic reaction to bevacizumab, other medications, foods, dyes, or preservatives Pregnant or trying to  get pregnant Breast-feeding How should I use this medication? This medication is injected into a vein. It is given by your care team in a hospital or clinic setting. Talk to your care team the use of this medication in children. Special care may be needed. Overdosage: If you think you have taken too much of this medicine contact a poison control center or emergency room at once. NOTE: This medicine is only for you. Do not share this medicine with others. What if I miss a dose? Keep appointments for follow-up doses. It is important not to miss your dose. Call your care team if you are unable to keep an appointment. What may interact with this medication? Interactions are not expected. This list may not describe all possible interactions. Give your health care provider a list of all the medicines, herbs, non-prescription drugs, or dietary supplements you use. Also tell them if you smoke, drink alcohol, or use illegal drugs. Some items may interact with your medicine. What should I watch for while using this medication? Your condition will be monitored carefully while you are receiving this medication. You may need blood work while taking this medication. This medication may make you feel generally unwell. This is not uncommon as chemotherapy can affect healthy cells as well as cancer cells. Report any side effects. Continue your course of treatment even though you feel ill unless your care team tells you to stop. This medication may increase your risk to bruise or bleed. Call your care team if you notice any unusual bleeding. Before having surgery, talk to your care team to make sure it is ok. This medication can increase the risk of poor healing of your surgical site or wound. You will need to stop this medication for 28 days before surgery. After surgery, wait at least 28 days before restarting this medication. Make sure the surgical site or wound is healed enough before restarting this medication.  Talk to your care team if questions. Talk to your care team if you may be pregnant. Serious birth defects can occur if you take this medication during pregnancy and for 6 months after the last dose. Contraception is recommended while taking this medication and for 6 months after the last dose. Your care team can help you find the option that works for you. Do not breastfeed while taking this medication and for 6 months after the last dose. This medication can cause infertility. Talk to your care team if you are concerned about your fertility. What side effects may I notice from receiving this medication? Side effects that you should report to your care team as soon as possible: Allergic reactions--skin rash, itching, hives, swelling of the face, lips, tongue, or throat Bleeding--bloody or black, tar-like stools, vomiting blood or brown material that looks like coffee grounds, red or dark brown urine, small red or purple spots on skin, unusual bruising or bleeding Blood clot--pain, swelling, or warmth in the leg, shortness of breath, chest pain Heart attack--pain or tightness in the chest, shoulders, arms, or jaw, nausea, shortness of breath, cold or clammy skin, feeling faint or lightheaded Heart failure--shortness of breath, swelling of the ankles, feet, or hands, sudden weight gain, unusual weakness or  fatigue Increase in blood pressure Infection--fever, chills, cough, sore throat, wounds that don't heal, pain or trouble when passing urine, general feeling of discomfort or being unwell Infusion reactions--chest pain, shortness of breath or trouble breathing, feeling faint or lightheaded Kidney injury--decrease in the amount of urine, swelling of the ankles, hands, or feet Stomach pain that is severe, does not go away, or gets worse Stroke--sudden numbness or weakness of the face, arm, or leg, trouble speaking, confusion, trouble walking, loss of balance or coordination, dizziness, severe headache,  change in vision Sudden and severe headache, confusion, change in vision, seizures, which may be signs of posterior reversible encephalopathy syndrome (PRES) Side effects that usually do not require medical attention (report to your care team if they continue or are bothersome): Back pain Change in taste Diarrhea Dry skin Increased tears Nosebleed This list may not describe all possible side effects. Call your doctor for medical advice about side effects. You may report side effects to FDA at 1-800-FDA-1088. Where should I keep my medication? This medication is given in a hospital or clinic. It will not be stored at home. NOTE: This sheet is a summary. It may not cover all possible information. If you have questions about this medicine, talk to your doctor, pharmacist, or health care provider.  2024 Elsevier/Gold Standard (2021-06-26 00:00:00)

## 2022-11-05 NOTE — Progress Notes (Signed)
[facility] OFFICE PROGRESS NOTE   Diagnosis: Peritoneal carcinoma  INTERVAL HISTORY:   Ms. Light returns as scheduled.  She completed another treatment with bevacizumab 10/15/2022.  She continues olaparib.  She denies nausea/vomiting.  No mouth sores.  No significant diarrhea.  No bleeding.  No abdominal pain.  No cough or shortness of breath.  Appetite is described as "fair".  Objective:  Vital signs in last 24 hours:  Blood pressure 136/85, pulse 93, temperature 98.1 F (36.7 C), temperature source Oral, resp. rate 18, height 4\' 11"  (1.499 m), weight 119 lb (54 kg), SpO2 96%.    HEENT: No thrush or ulcers. Resp: Lungs clear bilaterally. Cardio: Regular rate and rhythm. GI: No hepatosplenomegaly.  Right abdomen ostomy with semiformed stool.. Vascular: No leg edema. Skin: No rash. Port-A-Cath without erythema.  Lab Results:  Lab Results  Component Value Date   WBC 5.1 10/15/2022   HGB 12.6 10/15/2022   HCT 38.3 10/15/2022   MCV 104.4 (H) 10/15/2022   PLT 203 10/15/2022   NEUTROABS 3.4 10/15/2022    Imaging:  No results found.  Medications: I have reviewed the patient's current medications.  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  Bone lesions concerning for metastases, right lung nodule Laparoscopy, diverting loop ileostomy, gastrostomy tube placement, peritoneal biopsy 10/16/2021, no evidence of a primary tumor site at the appendix, ovaries, or uterus.  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   CDX2, CK20, and GATA3 negative.  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 Foundation 1-MSS, tumor mutation burden 5, HRD positive-LOH score 34.1% 08/11/2021 CA125 818 CT chest 11/03/2021-10 mm right lower lobe nodule, scattered tiny pulmonary nodules, peritoneal carcinomatosis Cycle 1 Taxol/carboplatin 11/12/2021 Cycle 2 Taxol/carboplatin 12/03/2021 Cycle 3 Taxol/carboplatin 12/24/2021 Bone scan 01/05/2022-negative for metastatic disease Cycle 4 Taxol/carboplatin 01/13/2022 CTs 01/22/2022-reduction in peritoneal carcinomatosis, no evidence of progressive disease stable bilateral lower lobe pulmonary nodules Cycle 5 Taxol/carboplatin 02/03/2022 Cycle 6 Taxol/carboplatin 02/24/2022 CT abdomen/pelvis 04/19/2022-slight decrease in peritoneal carcinomatosis with residual lesions 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   Abdominal distention/pain and diarrhea secondary to #1 3.   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  Lumpectomy and adjuvant CMF x8 followed by radiation 4. Hypertension 5. Hypothyroidism 6. Barrett's esophagus 7. Family history of multiple cancers including appendix, colon, and lung cancer 8.  Hospital admission 11/05/2021 with dehydration/prerenal azotemia secondary to nausea and high output ileostomy 9.  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 Admission 05/29/2022 with dehydration and acute renal injury    Disposition: Ms. Deveau appears stable.  She continues olaparib and bevacizumab.  She is  tolerating treatment well.  There is no clinical evidence of disease progression.  Plan to continue the same.  CBC and chemistry panel reviewed.  Labs adequate to proceed as above.  Urine negative for protein.  She  will return for follow-up and treatment in 3 weeks.  Lonna Cobb ANP/GNP-BC   11/05/2022  9:36 AM

## 2022-11-05 NOTE — Progress Notes (Signed)
Request for refill for [name] from [facility]. Refill approved and escribed to [facility] and faxed hardcopy to [facility] [phone].

## 2022-11-05 NOTE — Progress Notes (Signed)
Patient seen by Lisa Thomas NP today  Vitals are within treatment parameters.  Labs reviewed by Lisa Thomas NP and are within treatment parameters.  Per physician team, patient is ready for treatment and there are NO modifications to the treatment plan.     

## 2022-11-08 ENCOUNTER — Ambulatory Visit (HOSPITAL_COMMUNITY)
Admission: RE | Admit: 2022-11-08 | Discharge: 2022-11-08 | Disposition: A | Discharge status: Home / Self Care | Payer: Medicare HMO | Source: Ambulatory Visit | Attending: Plastic Surgery | Admitting: Plastic Surgery

## 2022-11-08 DIAGNOSIS — K435 Parastomal hernia without obstruction or  gangrene: Secondary | ICD-10-CM | POA: Insufficient documentation

## 2022-11-08 DIAGNOSIS — K4020 Bilateral inguinal hernia, without obstruction or gangrene, not specified as recurrent: Secondary | ICD-10-CM

## 2022-11-08 DIAGNOSIS — C786 Secondary malignant neoplasm of retroperitoneum and peritoneum: Secondary | ICD-10-CM | POA: Insufficient documentation

## 2022-11-08 DIAGNOSIS — K9419 Other complications of enterostomy: Secondary | ICD-10-CM | POA: Diagnosis not present

## 2022-11-08 DIAGNOSIS — Z432 Encounter for attention to ileostomy: Secondary | ICD-10-CM | POA: Diagnosis not present

## 2022-11-08 DIAGNOSIS — Z853 Personal history of malignant neoplasm of breast: Secondary | ICD-10-CM | POA: Insufficient documentation

## 2022-11-08 DIAGNOSIS — L24B3 Irritant contact dermatitis related to fecal or urinary stoma or fistula: Secondary | ICD-10-CM

## 2022-11-08 NOTE — Progress Notes (Signed)
[facility]   Reason for visit:  RLQ Ileostomy  recent increase in leaks.   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   Cataract    Family history of colon cancer    Gastrostomy in place [facility])    LUQ - placed 10/16/2021   Hypertension    Ileostomy, has currently (HCC)    placed 09/2021   SUI (stress urinary incontinence, female)    Thyroid disease    Urinary urgency    Family History  Problem Relation Age of Onset   Colon cancer Mother 62   Lung cancer Sister    Cancer Maternal Aunt        NOS   Cancer Daughter        appendiceal   Stomach cancer Neg Hx    Rectal cancer Neg Hx    Esophageal cancer Neg Hx    Allergies  Allergen Reactions   Sporanox [Itraconazole] Rash   Current Outpatient Medications  Medication Sig Dispense Refill Last Dose   acetaminophen (TYLENOL) 325 MG tablet Take 2 tablets (650 mg total) by mouth every 6 (six) hours as needed for mild pain or fever.      amLODipine (NORVASC) 2.5 MG tablet Take 2.5 mg by mouth daily.      diphenoxylate-atropine (LOMOTIL) 2.5-0.025 MG tablet TAKE ONE TABLET BY MOUTH EVERY 12 HOURS AS NEEDED FOR LOOSE STOOLS 60 tablet 1    fluticasone (FLONASE) 50 MCG/ACT nasal spray Place 2 sprays into both nostrils 2 (two) times daily.      ibuprofen (ADVIL) 200 MG tablet Take 400 mg by mouth 2 (two) times daily as needed.      lactose free nutrition (BOOST PLUS) LIQD Take 237 mLs by mouth 3 (three) times daily with meals. (Patient taking differently: Take 237 mLs by mouth daily.)  0    loperamide (IMODIUM A-D) 2 MG tablet Take 2 tablets (4 mg total) by mouth 4 (four) times daily as needed for diarrhea or loose stools. (Patient taking differently: Take 4 mg by mouth in the morning, at noon, and at bedtime.) 60 tablet 2     loratadine (CLARITIN) 10 MG tablet Take 10 mg by mouth 2 (two) times daily.      magnesium oxide (MAG-OX) 400 (240 Mg) MG tablet TAKE 1 TABLET BY MOUTH 2 TIMES A DAY 60 tablet 2    methocarbamol (ROBAXIN) 500 MG tablet Take 1 tablet (500 mg total) by mouth every 8 (eight) hours as needed for muscle spasms. (Patient not taking: Reported on 05/20/2022) 30 tablet 1    mirtazapine (REMERON) 7.5 MG tablet Take 1 tablet (7.5 mg total) by mouth at bedtime. 30 tablet 5    Multiple Vitamin (MULTIVITAMIN PO) Take 1 tablet by mouth daily with breakfast.      nystatin (MYCOSTATIN/NYSTOP) powder Apply 1 Application topically 3 (three) times daily. 30 g 0    olaparib ([name]) 150 MG tablet 1 tablet Orally Twice a day 60 tablet 2    ondansetron (ZOFRAN) 4 MG tablet Take 4 mg by mouth as needed. (Patient not taking: Reported on 06/09/2022)      potassium chloride (MICRO-K) 10 MEQ CR capsule TAKE 1 CAPSULE BY MOUTH 2 TIMES A DAY 60 capsule 1    thiamine (  VITAMIN B1) 100 MG tablet Take 1 tablet (100 mg total) by mouth daily. 30 tablet 5    No current facility-administered medications for this encounter.   Facility-Administered Medications Ordered in Other Encounters  Medication Dose Route Frequency Provider Last Rate Last Admin   0.9 %  sodium chloride infusion   Intravenous Continuous Rana Snare, NP       0.9 %  sodium chloride infusion   Intravenous Once Ladene Artist, MD       dexamethasone (DECADRON) 10 mg in sodium chloride 0.9 % 50 mL IVPB  10 mg Intravenous Once Ladene Artist, MD       ROS  Review of Systems  Constitutional:  Positive for fatigue.  Gastrointestinal:        RLQ ileostomy Parastomal hernia  Musculoskeletal:  Positive for gait problem.       Uses cane  All other systems reviewed and are negative.  Vital signs:  BP (!) 170/87   Pulse 100   Temp 97.6 F (36.4 C)   Resp 17   SpO2 96%  Exam:  Physical Exam Vitals reviewed.  Constitutional:      Appearance: Normal  appearance.  Abdominal:     Palpations: Abdomen is soft.     Hernia: A hernia is present.  Skin:    General: Skin is warm.     Findings: Rash present.  Neurological:     Mental Status: She is alert and oriented to person, place, and time.  Psychiatric:        Mood and Affect: Mood normal.        Behavior: Behavior normal.     Stoma type/location:  RLQ ileostomy Stomal assessment/size:  1 1/2" flush stoma, herniated mucosa present.  Parastomal bulge/hernia Peristomal assessment: Hernia creates rounded pouching surface. Creasing below stoma causing appliance to leak.    barrier ring and 1 piece flat in place today.   Treatment options for stomal/peristomal skin: will place in coloplast 1 piece convex.  COmplains of adhesive backing peeling and irritating her skin.  This pouch is tapeless and has loops for ostomy belt for additional support.  Output: soft tan stool.   Ostomy pouching: 1pc. Convex with tapeless back.  Due to irregular contour of abdomen, we will use an Eakin seal to promote adhesion.  WE will Implement 1 piece convex with ostomy belt.  The belt I have is too large.  Spouse orders from [employer] and will request smaller belt if this pouch system works well.  Education provided:  see above  given 3 additional pouches and seals.      Impression/dx  Parastomal hernia colostomy Discussion  Switched to 1 piece convex with tapeless backing to improve skin.  Recommend belt.   Plan  See back as needed.  Spouse to order from [employer]    Visit time: 45 minutes.   Maple Hudson FNP-BC

## 2022-11-09 ENCOUNTER — Other Ambulatory Visit: Payer: Self-pay

## 2022-11-10 ENCOUNTER — Ambulatory Visit (HOSPITAL_BASED_OUTPATIENT_CLINIC_OR_DEPARTMENT_OTHER): Payer: Medicare HMO | Admitting: Physical Therapy

## 2022-11-10 ENCOUNTER — Encounter (HOSPITAL_BASED_OUTPATIENT_CLINIC_OR_DEPARTMENT_OTHER): Payer: Self-pay | Admitting: Physical Therapy

## 2022-11-10 DIAGNOSIS — R262 Difficulty in walking, not elsewhere classified: Secondary | ICD-10-CM

## 2022-11-10 DIAGNOSIS — M6281 Muscle weakness (generalized): Secondary | ICD-10-CM

## 2022-11-10 NOTE — Therapy (Signed)
OUTPATIENT PHYSICAL THERAPY LOWER EXTREMITY TREATMENT  RE-EVALUATION   Progress Note  Reporting Period 08/11/22 to 11/10/22  See note below for Objective Data and Assessment of Progress/Goals.      Patient Name: Nancy Harrison MRN: [id number] DOB:07/21/1940, 82 y.o., female Today's Date: 11/10/2022  END OF SESSION:  PT End of Session - 11/10/22 1239     Visit Number 15    Number of Visits 28    Date for PT Re-Evaluation 02/08/23    Authorization Type [employer] MCR    Progress Note Due on Visit 19    PT Start Time 1145    PT Stop Time 1224    PT Time Calculation (min) 39 min    Equipment Utilized During Treatment Gait belt    Activity Tolerance Patient tolerated treatment well    Behavior During Therapy WFL for tasks assessed/performed                       Past Medical History:  Diagnosis Date   Arthritis    Barrett's esophagus    Path 10/2001   Breast cancer (HCC) 01/01/2011   Cancer (HCC) 1999   rt breast   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   Cataract    Family history of colon cancer    Gastrostomy in place [facility])    LUQ - placed 10/16/2021   Hypertension    Ileostomy, has currently (HCC)    placed 09/2021   SUI (stress urinary incontinence, female)    Thyroid disease    Urinary urgency    Past Surgical History:  Procedure Laterality Date   BREAST LUMPECTOMY  1999   Chemo and radiation on right breast   IR IMAGING GUIDED PORT INSERTION  11/11/2021   LAPAROSCOPY N/A 10/16/2021   Procedure: LAPAROSCOPIC  DIVERTING OSTOMY, BIOSPY,  G-TUBE PLACEMENT;  Surgeon: Karie Soda, MD;  Location: [facility];  Service: General;  Laterality: N/A;   TONSILLECTOMY  1957   TOTAL KNEE ARTHROPLASTY  1999 & 2001   Patient Active Problem List   Diagnosis Date Noted   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   Acute renal failure (HCC) 05/30/2022   Non-recurrent unilateral inguinal hernia without obstruction or gangrene 04/13/2022   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   Non-recurrent bilateral inguinal hernia without obstruction or gangrene 04/06/2022   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   Genetic testing 12/03/2021   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   Nausea & vomiting 11/06/2021   Hyponatremia 11/06/2021   Family history of colon cancer 11/05/2021   ARF (acute renal failure) (HCC) 11/05/2021   Irritant contact dermatitis associated with digestive stoma    Gastrostomy tube dysfunction (HCC)    Irritant contact dermatitis associated with fecal stoma    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 10/17/2021   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) 10/17/2021   Ileostomy care (HCC) 10/17/2021   Colonic mass 10/15/2021   History of breast cancer 12/30/2011   Leukopenia 04/09/2011   Anxiety state 04/06/2007   Esophageal reflux 04/06/2007   Essential hypertension 04/06/2007   Hyperlipidemia 04/06/2007   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 Pt was out of therapy for a month due to a family death. Pt feels she is 25% worse than when she stopped therapy due to not be able to exercise as she did before. Pt has been limited with walking for distance/exercise.   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 No: NPRS scale: 0/10 Pain location: R hip Pain description: aching   Aggravating factors: "when I move my leg the wrong way it makes it hurt in my back"  (demonstrates marching) Relieving factors: not doing the motion that hurts   PRECAUTIONS: None  WEIGHT BEARING RESTRICTIONS: No  FALLS:  Has patient fallen in last 6 months? No  LIVING ENVIRONMENT: Lives with: lives with their spouse Lives in: House/apartment, has basement  (usually goes down there 1x/month)  Stairs: yes , 6 steps to enter with step to pattern  Has following equipment at home: Single point cane, walker, shower grab bars   OCCUPATION: N/A Leisure: reading, computer games   PLOF: Independent with basic ADLs  PATIENT GOALS: getting her strength back,    OBJECTIVE:   DIAGNOSTIC FINDINGS: no relevant MSK imaging  PATIENT SURVEYS:  FOTO 41 52 @ DC 8 pts MCII  FOTO 7/18 47 FOTO 7/30: 42  9/18 FOTO     LOWER EXTREMITY MMT:  MMT Right 7/18 9/18 Left 7/18 9/18  Hip flexion 4/5 4+/5 4/5 4/5  Hip extension      Hip abduction 4/5  4/5   Hip adduction 4/5 4/5 4/5 4/5 p! Into back  Hip internal rotation      Hip external rotation      Knee flexion 4-/5 4/5 4-/5 4/5  Knee extension 4/5 4+/5 4/5 4/5 p! Into back  Ankle dorsiflexion      Ankle plantarflexion 4/5  4/5   Ankle inversion      Ankle eversion       (Blank rows = not tested)  FUNCTIONAL TESTS:  5 times sit to stand: 15.2s  Timed up and go (TUG): 14.3s  7/18 5XSTS 14.5s   7/18 TUG 12.9s  9/18 TUG 14.2s 9/18 5XSTS 11.9s  4 stage balance: passed NBOS, semi tandem, unable to pass tandem <9s   GAIT: Distance walked: 29ft Assistive device utilized: Single point cane Level of assistance: Complete Independence Comments: shortened step length, shuffling gait, low foot clearance, fatigued, requires seated rest break   TODAY'S TREATMENT:                                                                                                                              DATE:   9/18  Nustep L3x5 minutes UE  and LE  Supine bridge 2x10 SKTC stretch 5s 10x  Standing march  2x20 Seated QL 30s 2x  9/12  Nustep L5x8 minutes UE and LE  6" box step ups x10 each HR/TR 20x Mini squat at rail 2x10 Gait with SPC 1 lap x2 (233ft)  8/1  Nustep L4x8 minutes UE and LE  White leg press 70lbs 4x4 YTB paloff 2x10 each side 6" box step ups 2x10 each HR/TR 20x  7/30  Nustep L4x8 minutes UE and LE  All standing exercise (only sit as needed due to back discomfort, working on standing tolerance)   NBOS with head nods/turns 2x30 sec ea-standing on airex FT on airex with cone reach across body x10ea SBA Mini squat at rail 2x10 Standing HR 2x15 Standing march 2x20 Gait with SPC 237 ft Sidestepping at rail YTB at knees 3x  Step ups 4" L LE 2x10, 6" RLE 2x10  Sit to stands from chair- 2x10  7/24  Nustep L4x8 minutes UE and LE  All standing exercise (only sit as needed due to back discomfort, working on standing tolerance)   Tandem reach outside BOS 30sec ea position NBOS with head nods/turns 2x30 sec ea-standing on airex Mini squat at rail 2x10 Standing HR 2x20 Standing march 2x20 Gait with SPC 237 ft Sidestepping at rail YTB at knees 3x  Step ups 4" L LE 2x10, 6" RLE 2x10  Sit to stands from chair- 2x10  PATIENT EDUCATION:  Education details: anatomy, exercise progression, DOMS expectations, envelope of function, HEP, POC  Person educated: Patient Education method: Explanation, Demonstration, Tactile cues, Verbal cues, and Handouts Education comprehension: verbalized understanding, returned demonstration, verbal cues required, and tactile cues required  HOME EXERCISE PROGRAM: Access Code: [email or web address] URL: [email or web address] Date: 08/11/2022 Prepared by: Zebedee Iba  Updated 7/16  ASSESSMENT:  CLINICAL IMPRESSION: Pt testing demonstrates no regress in LE strength but LBP, walking speed, and balance had regressed since last formal assessment. Pt returns to  therapy at this time following time away to attend to family matters. Pt continues to have functional deficits, mobility difficulties, and endurance deficits that are limiting her ability to participate fully in ADL and IADL. Pt HEP regressed at this time due to endurance deficits. Plan to rebuild HEP moving forward. Pt would benefit from continued skilled therapy in order to reach goals and maximize functional LE strength, general endurance, and functional mobility for prevention of functional decline and falls reduction.     OBJECTIVE IMPAIRMENTS: Abnormal gait, decreased activity tolerance, decreased balance, decreased endurance, decreased knowledge of use of DME, decreased mobility, difficulty walking, decreased ROM, decreased strength, hypomobility, impaired flexibility, improper body mechanics, postural dysfunction, and pain.    ACTIVITY LIMITATIONS: carrying, lifting, bending, standing, squatting, stairs, transfers, bed mobility, and locomotion level   PARTICIPATION LIMITATIONS: meal prep, cleaning, laundry, driving, shopping, and community activity and exercise   PERSONAL FACTORS: Age, Fitness, Time since onset of injury/illness/exacerbation, and 1-2 comorbidities:    are also affecting patient's functional outcome.    REHAB POTENTIAL: Fair     CLINICAL DECISION MAKING: Evolving/moderate complexity   EVALUATION COMPLEXITY: Moderate     GOALS:     SHORT TERM GOALS: Target date: 09/22/2022    Pt will become independent with HEP in order to demonstrate synthesis of PT education.    Goal status: met   2.  Pt will be able to perform 5XSTS in under 12s  in order to demonstrate functional improvement above the cut off score for adults.   Goal status: ongoing   3.  Pt will be able to demonstrate ability to  perform gait with upright posture and AD  in order to demonstrate functional improvement in LE function for safety with community ambulation    Goal status: met       LONG TERM  GOALS: Target date:02/08/2023    Pt will be able to demonstrate TUG in under 10 sec in order to demonstrate functional improvement in LE function, strength, balance, and mobility for safety with community ambulation.   Goal status: ongoing   Pt will decrease 5XSTS by at least 3 seconds in order to demonstrate clinically significant improvement in LE strength    Goal status: ongoing   3.  Pt will be able to demonstrate/report ability to walk >10 mins in order to demonstrate functional improvement and tolerance to exercise and community mobility.    Goal status: MET   4.  Pt will score >/= 57 on FOTO to demonstrate improvement in perceived mobility and function.    Goal status: ongoing        PLAN:   PT FREQUENCY: 1-2x/week   PT DURATION: 12 weeks   PLANNED INTERVENTIONS: Therapeutic exercises, Therapeutic activity, Neuromuscular re-education, Balance training, Gait training, Patient/Family education, Self Care, Joint mobilization, Joint manipulation, Stair training, Prosthetic training, DME instructions, Aquatic Therapy, Dry Needling, Electrical stimulation, Spinal manipulation, Spinal mobilization, Moist heat, scar mobilization, Splintting, Taping, Vasopneumatic device, Traction, Ultrasound, Ionotophoresis 4mg /ml Dexamethasone, Manual therapy, and Re-evaluation   PLAN FOR NEXT SESSION: LE exercise for transfers/transfer mechanics, consider resistance circuits, standing balance exercise, functional activity tolerance   Zebedee Iba PT, DPT 11/10/22 12:51 PM

## 2022-11-12 DIAGNOSIS — K4020 Bilateral inguinal hernia, without obstruction or gangrene, not specified as recurrent: Secondary | ICD-10-CM | POA: Insufficient documentation

## 2022-11-15 ENCOUNTER — Other Ambulatory Visit: Payer: Self-pay

## 2022-11-15 DIAGNOSIS — Z932 Ileostomy status: Secondary | ICD-10-CM | POA: Diagnosis not present

## 2022-11-17 DIAGNOSIS — Z932 Ileostomy status: Secondary | ICD-10-CM | POA: Diagnosis not present

## 2022-11-18 ENCOUNTER — Encounter (HOSPITAL_BASED_OUTPATIENT_CLINIC_OR_DEPARTMENT_OTHER): Payer: Self-pay | Admitting: Physical Therapy

## 2022-11-18 ENCOUNTER — Ambulatory Visit (HOSPITAL_BASED_OUTPATIENT_CLINIC_OR_DEPARTMENT_OTHER): Payer: Medicare HMO | Admitting: Physical Therapy

## 2022-11-18 DIAGNOSIS — M6281 Muscle weakness (generalized): Secondary | ICD-10-CM

## 2022-11-18 DIAGNOSIS — R262 Difficulty in walking, not elsewhere classified: Secondary | ICD-10-CM | POA: Diagnosis not present

## 2022-11-18 NOTE — Therapy (Signed)
OUTPATIENT PHYSICAL THERAPY LOWER EXTREMITY TREATMENT  RE-EVALUATION   Progress Note  Reporting Period 08/11/22 to 11/10/22  See note below for Objective Data and Assessment of Progress/Goals.      Patient Name: Nancy Harrison MRN: [id number] DOB:1941-02-11, 82 y.o., female Today's Date: 11/18/2022  END OF SESSION:  PT End of Session - 11/18/22 1524     Visit Number 16    Number of Visits 28    Date for PT Re-Evaluation 02/08/23    Authorization Type [employer] MCR    Progress Note Due on Visit 19    PT Start Time 1521    PT Stop Time 1600    PT Time Calculation (min) 39 min    Equipment Utilized During Treatment Gait belt    Activity Tolerance Patient tolerated treatment well    Behavior During Therapy WFL for tasks assessed/performed                       Past Medical History:  Diagnosis Date   Arthritis    Barrett's esophagus    Path 10/2001   Breast cancer (HCC) 01/01/2011   Cancer (HCC) 1999   rt breast   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   Cataract    Family history of colon cancer    Gastrostomy in place [facility])    LUQ - placed 10/16/2021   Hypertension    Ileostomy, has currently (HCC)    placed 09/2021   SUI (stress urinary incontinence, female)    Thyroid disease    Urinary urgency    Past Surgical History:  Procedure Laterality Date   BREAST LUMPECTOMY  1999   Chemo and radiation on right breast   IR IMAGING GUIDED PORT INSERTION  11/11/2021   LAPAROSCOPY N/A 10/16/2021   Procedure: LAPAROSCOPIC  DIVERTING OSTOMY, BIOSPY,  G-TUBE PLACEMENT;  Surgeon: Karie Soda, MD;  Location: [facility];  Service: General;  Laterality: N/A;   TONSILLECTOMY  1957   TOTAL KNEE ARTHROPLASTY  1999 & 2001   Patient Active Problem List   Diagnosis Date Noted   Bilateral inguinal hernia without obstruction or gangrene 11/12/2022   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   Acute renal failure (HCC) 05/30/2022   Non-recurrent unilateral inguinal hernia  without obstruction or gangrene 04/13/2022   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   Non-recurrent bilateral inguinal hernia without obstruction or gangrene 04/06/2022   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   Genetic testing 12/03/2021   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   Nausea & vomiting 11/06/2021   Hyponatremia 11/06/2021   Family history of colon cancer 11/05/2021   ARF (acute renal failure) (HCC) 11/05/2021   Irritant contact dermatitis associated with digestive stoma    Gastrostomy tube dysfunction (HCC)    Irritant contact dermatitis associated with fecal stoma    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 10/17/2021   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) 10/17/2021   Ileostomy care (HCC) 10/17/2021   Colonic mass 10/15/2021   History of breast cancer 12/30/2011   Leukopenia 04/09/2011   Anxiety state 04/06/2007   Esophageal reflux 04/06/2007   Essential hypertension 04/06/2007   Hyperlipidemia 04/06/2007   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 Still trouble with STS and steps.   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 No: NPRS scale: 0/10 Pain location: R hip Pain description: aching  Aggravating factors: "when I move my leg the wrong way it makes it hurt in my back"  (demonstrates marching) Relieving factors: not doing the  motion that hurts   PRECAUTIONS: None  WEIGHT BEARING RESTRICTIONS: No  FALLS:  Has patient fallen in last 6 months? No  LIVING ENVIRONMENT: Lives with: lives with their spouse Lives in: House/apartment, has basement  (usually goes down there 1x/month)  Stairs: yes , 6 steps to enter with step to pattern  Has following equipment at home: Single point cane, walker, shower grab bars   OCCUPATION: N/A Leisure: reading, computer games   PLOF: Independent with basic ADLs  PATIENT GOALS: getting her strength back,    OBJECTIVE:   DIAGNOSTIC FINDINGS: no relevant MSK imaging  PATIENT SURVEYS:  FOTO 41 52 @ DC 8 pts MCII  FOTO 7/18 47 FOTO 7/30: 42  9/18 FOTO     LOWER EXTREMITY MMT:  MMT Right 7/18 9/18 Left 7/18 9/18  Hip flexion 4/5 4+/5 4/5 4/5  Hip extension      Hip abduction 4/5  4/5   Hip adduction 4/5 4/5 4/5 4/5 p! Into back  Hip internal rotation      Hip external rotation      Knee flexion 4-/5 4/5 4-/5 4/5  Knee extension 4/5 4+/5 4/5 4/5 p! Into back  Ankle dorsiflexion      Ankle plantarflexion 4/5  4/5   Ankle inversion      Ankle eversion       (Blank rows = not tested)  FUNCTIONAL TESTS:  5 times sit to stand: 15.2s  Timed up and go (TUG): 14.3s  7/18 5XSTS 14.5s   7/18 TUG 12.9s  9/18 TUG 14.2s 9/18 5XSTS 11.9s  4 stage balance: passed NBOS, semi tandem, unable to pass tandem <9s   GAIT: Distance walked: 63ft Assistive device utilized: Single point cane Level of assistance: Complete Independence Comments: shortened step length, shuffling gait, low foot clearance, fatigued, requires seated rest break   TODAY'S TREATMENT:                                                                                                                              DATE:  11/18/22 Nustep L3x5 minutes UE and LE Tandem stance 2 x 20 -30 second holds bilateral SLS with 3 way vectors with unilateral HHA 3 x 5 second holds bilateral  Lateral step up 6  inch 1 x 10 bilateral  Lateral stepping with perpendicular resistance 2 x 5 RTB  bilateral  9/18  Nustep L3x5 minutes UE and LE  Supine bridge 2x10 SKTC stretch 5s 10x  Standing march 2x20 Seated QL 30s 2x  9/12  Nustep L5x8 minutes UE and LE  6" box step ups x10 each HR/TR 20x Mini squat at rail 2x10 Gait with SPC 1 lap x2 (258ft)  8/1  Nustep L4x8 minutes UE and LE  White leg press 70lbs 4x4 YTB paloff 2x10 each side 6" box step ups 2x10 each HR/TR 20x  7/30  Nustep L4x8 minutes UE and LE  All standing exercise (only sit as needed due to back discomfort, working on standing tolerance)   NBOS with head nods/turns 2x30 sec ea-standing on airex FT on airex with cone reach across body x10ea SBA Mini squat at rail 2x10 Standing HR 2x15 Standing march 2x20 Gait with SPC 237 ft Sidestepping at rail YTB at knees 3x  Step ups 4" L LE 2x10, 6" RLE 2x10  Sit to stands from chair- 2x10  7/24  Nustep L4x8 minutes UE and LE  All standing exercise (only sit as needed due to back discomfort, working on standing tolerance)   Tandem reach outside BOS 30sec ea position NBOS with head nods/turns 2x30 sec ea-standing on airex Mini squat at rail 2x10 Standing HR 2x20 Standing march 2x20 Gait with SPC 237 ft Sidestepping at rail YTB at knees 3x  Step ups 4" L LE 2x10, 6" RLE 2x10  Sit to stands from chair- 2x10  PATIENT EDUCATION:  Education details: anatomy, exercise progression, DOMS expectations, envelope of function, HEP, POC  Person educated: Patient Education method: Explanation, Demonstration, Tactile cues, Verbal cues, and Handouts Education comprehension: verbalized understanding, returned demonstration, verbal cues required, and tactile cues required  HOME EXERCISE PROGRAM: Access Code: [email or web address] URL: [email or web address] Date: 08/11/2022 Prepared by: Zebedee Iba  Updated 11/18/22  ASSESSMENT:  CLINICAL IMPRESSION: Began session on  nustep for dynamic warm up and conditioning. Continued with balance and strengthening exercises which are tolerated well. Patient will continue to benefit from physical therapy in order to improve function and reduce impairment.     OBJECTIVE IMPAIRMENTS: Abnormal gait, decreased activity tolerance, decreased balance, decreased endurance, decreased knowledge of use of DME, decreased mobility, difficulty walking, decreased ROM, decreased strength, hypomobility, impaired flexibility, improper body mechanics, postural dysfunction, and pain.    ACTIVITY LIMITATIONS: carrying, lifting, bending, standing, squatting, stairs, transfers, bed mobility, and locomotion level   PARTICIPATION LIMITATIONS: meal prep, cleaning, laundry, driving, shopping, and community activity and exercise   PERSONAL FACTORS: Age, Fitness, Time since onset of injury/illness/exacerbation, and 1-2 comorbidities:    are also affecting patient's functional outcome.    REHAB POTENTIAL: Fair     CLINICAL DECISION MAKING: Evolving/moderate complexity   EVALUATION COMPLEXITY: Moderate     GOALS:     SHORT TERM GOALS: Target date: 09/22/2022    Pt will become independent with HEP in order to demonstrate synthesis of PT education.    Goal status: met   2.  Pt will be able to perform 5XSTS in under 12s  in order to demonstrate functional improvement above the cut off score for adults.   Goal status: ongoing   3.  Pt will be able to demonstrate ability to perform gait with upright posture and AD  in order to demonstrate functional improvement in LE function for safety with community ambulation    Goal status: met       LONG TERM GOALS: Target date:02/08/2023    Pt will be able to  demonstrate TUG in under 10 sec in order to demonstrate functional improvement in LE function, strength, balance, and mobility for safety with community ambulation.   Goal status: ongoing   Pt will decrease 5XSTS by at least 3 seconds in  order to demonstrate clinically significant improvement in LE strength    Goal status: ongoing   3.  Pt will be able to demonstrate/report ability to walk >10 mins in order to demonstrate functional improvement and tolerance to exercise and community mobility.    Goal status: MET   4.  Pt will score >/= 57 on FOTO to demonstrate improvement in perceived mobility and function.    Goal status: ongoing        PLAN:   PT FREQUENCY: 1-2x/week   PT DURATION: 12 weeks   PLANNED INTERVENTIONS: Therapeutic exercises, Therapeutic activity, Neuromuscular re-education, Balance training, Gait training, Patient/Family education, Self Care, Joint mobilization, Joint manipulation, Stair training, Prosthetic training, DME instructions, Aquatic Therapy, Dry Needling, Electrical stimulation, Spinal manipulation, Spinal mobilization, Moist heat, scar mobilization, Splintting, Taping, Vasopneumatic device, Traction, Ultrasound, Ionotophoresis 4mg /ml Dexamethasone, Manual therapy, and Re-evaluation   PLAN FOR NEXT SESSION: LE exercise for transfers/transfer mechanics, consider resistance circuits, standing balance exercise, functional activity tolerance    Nancy Harrison, PT 11/18/2022, 3:24 PM

## 2022-11-20 ENCOUNTER — Encounter (HOSPITAL_BASED_OUTPATIENT_CLINIC_OR_DEPARTMENT_OTHER): Payer: Self-pay | Admitting: Physical Therapy

## 2022-11-20 ENCOUNTER — Other Ambulatory Visit: Payer: Self-pay | Admitting: Oncology

## 2022-11-20 ENCOUNTER — Ambulatory Visit (HOSPITAL_BASED_OUTPATIENT_CLINIC_OR_DEPARTMENT_OTHER): Payer: Medicare HMO | Admitting: Physical Therapy

## 2022-11-20 DIAGNOSIS — M6281 Muscle weakness (generalized): Secondary | ICD-10-CM | POA: Diagnosis not present

## 2022-11-20 DIAGNOSIS — R262 Difficulty in walking, not elsewhere classified: Secondary | ICD-10-CM

## 2022-11-20 DIAGNOSIS — C481 Malignant neoplasm of specified parts of peritoneum: Secondary | ICD-10-CM

## 2022-11-20 NOTE — Therapy (Signed)
OUTPATIENT PHYSICAL THERAPY LOWER EXTREMITY TREATMENT  RE-EVALUATION   Progress Note  Reporting Period 08/11/22 to 11/10/22  See note below for Objective Data and Assessment of Progress/Goals.      Patient Name: Fiora Majcher MRN: [id number] DOB:November 20, 1940, 82 y.o., female Today's Date: 11/20/2022  END OF SESSION:  PT End of Session - 11/20/22 1049     Visit Number 17    Number of Visits 28    Date for PT Re-Evaluation 02/08/23    Authorization Type [employer] MCR    Progress Note Due on Visit 19    PT Start Time 1049    PT Stop Time 1128    PT Time Calculation (min) 39 min    Equipment Utilized During Treatment Gait belt    Activity Tolerance Patient tolerated treatment well    Behavior During Therapy WFL for tasks assessed/performed                       Past Medical History:  Diagnosis Date   Arthritis    Barrett's esophagus    Path 10/2001   Breast cancer (HCC) 01/01/2011   Cancer (HCC) 1999   rt breast   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   Cataract    Family history of colon cancer    Gastrostomy in place [facility])    LUQ - placed 10/16/2021   Hypertension    Ileostomy, has currently (HCC)    placed 09/2021   SUI (stress urinary incontinence, female)    Thyroid disease    Urinary urgency    Past Surgical History:  Procedure Laterality Date   BREAST LUMPECTOMY  1999   Chemo and radiation on right breast   IR IMAGING GUIDED PORT INSERTION  11/11/2021   LAPAROSCOPY N/A 10/16/2021   Procedure: LAPAROSCOPIC  DIVERTING OSTOMY, BIOSPY,  G-TUBE PLACEMENT;  Surgeon: Karie Soda, MD;  Location: [facility];  Service: General;  Laterality: N/A;   TONSILLECTOMY  1957   TOTAL KNEE ARTHROPLASTY  1999 & 2001   Patient Active Problem List   Diagnosis Date Noted   Bilateral inguinal hernia without obstruction or gangrene 11/12/2022   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   Acute renal failure (HCC) 05/30/2022   Non-recurrent unilateral inguinal hernia  without obstruction or gangrene 04/13/2022   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   Non-recurrent bilateral inguinal hernia without obstruction or gangrene 04/06/2022   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   Genetic testing 12/03/2021   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   Nausea & vomiting 11/06/2021   Hyponatremia 11/06/2021   Family history of colon cancer 11/05/2021   ARF (acute renal failure) (HCC) 11/05/2021   Irritant contact dermatitis associated with digestive stoma    Gastrostomy tube dysfunction (HCC)    Irritant contact dermatitis associated with fecal stoma    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 10/17/2021   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) 10/17/2021   Ileostomy care (HCC) 10/17/2021   Colonic mass 10/15/2021   History of breast cancer 12/30/2011   Leukopenia 04/09/2011   Anxiety state 04/06/2007   Esophageal reflux 04/06/2007   Essential hypertension 04/06/2007   Hyperlipidemia 04/06/2007   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 Felt like it was something she needed to do. A little muscle ache today.  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 No: NPRS scale: 0/10 Pain location: R hip Pain description: aching  Aggravating factors: "when I move my leg the wrong way it makes it hurt in my back"   (demonstrates marching) Relieving factors: not doing the motion that hurts   PRECAUTIONS: None  WEIGHT BEARING RESTRICTIONS: No  FALLS:  Has patient fallen in last 6 months? No  LIVING ENVIRONMENT: Lives with: lives with their spouse Lives in: House/apartment, has basement  (usually goes down there 1x/month)  Stairs: yes , 6 steps to enter with step to pattern  Has following equipment at home: Single point cane, walker, shower grab bars   OCCUPATION: N/A Leisure: reading, computer games   PLOF: Independent with basic ADLs  PATIENT GOALS: getting her strength back,    OBJECTIVE:   DIAGNOSTIC FINDINGS: no relevant MSK imaging  PATIENT SURVEYS:  FOTO 41 52 @ DC 8 pts MCII  FOTO 7/18 47 FOTO 7/30: 42  9/18 FOTO     LOWER EXTREMITY MMT:  MMT Right 7/18 9/18 Left 7/18 9/18  Hip flexion 4/5 4+/5 4/5 4/5  Hip extension      Hip abduction 4/5  4/5   Hip adduction 4/5 4/5 4/5 4/5 p! Into back  Hip internal rotation      Hip external rotation      Knee flexion 4-/5 4/5 4-/5 4/5  Knee extension 4/5 4+/5 4/5 4/5 p! Into back  Ankle dorsiflexion      Ankle plantarflexion 4/5  4/5   Ankle inversion      Ankle eversion       (Blank rows = not tested)  FUNCTIONAL TESTS:  5 times sit to stand: 15.2s  Timed up and go (TUG): 14.3s  7/18 5XSTS 14.5s   7/18 TUG 12.9s  9/18 TUG 14.2s 9/18 5XSTS 11.9s  4 stage balance: passed NBOS, semi tandem, unable to pass tandem <9s   GAIT: Distance walked: 21ft Assistive device utilized: Single point cane Level of assistance: Complete Independence Comments: shortened step length, shuffling gait, low foot clearance, fatigued, requires seated rest break   TODAY'S TREATMENT:                                                                                                                              DATE:  11/20/22 Nustep L4x5 minutes UE and LE Standing hip abduction YTB at knees 2 x 10 bilateral  Standing hip extension YTB  at knees 2 x 10 bilateral  Lateral step up 6 inch 1 x 10 bilateral  LAQ 1# 1 x  10 with 5 second holds Standing alternating march 2 x 10 bilateral Tandem stance 2 x 30 second holds bilateral Seated hip adduction 2 x 10 x 10 second holds  11/18/22 Nustep L3x5 minutes UE and LE Tandem stance 2 x 20 -30 second holds bilateral SLS with 3 way vectors with unilateral HHA 3 x 5 second holds bilateral  Lateral step up 6 inch 1 x 10 bilateral  Lateral stepping with perpendicular resistance 2 x 5 RTB bilateral  9/18  Nustep L3x5 minutes UE and LE  Supine bridge 2x10 SKTC stretch 5s 10x  Standing march 2x20 Seated QL 30s 2x  9/12  Nustep L5x8 minutes UE and LE  6" box step ups x10 each HR/TR 20x Mini squat at rail 2x10 Gait with SPC 1 lap x2 (215ft)  8/1  Nustep L4x8 minutes UE and LE  White leg press 70lbs 4x4 YTB paloff 2x10 each side 6" box step ups 2x10 each HR/TR 20x  7/30  Nustep L4x8 minutes UE and LE  All standing exercise (only sit as needed due to back discomfort, working on standing tolerance)   NBOS with head nods/turns 2x30 sec ea-standing on airex FT on airex with cone reach across body x10ea SBA Mini squat at rail 2x10 Standing HR 2x15 Standing march 2x20 Gait with SPC 237 ft Sidestepping at rail YTB at knees 3x  Step ups 4" L LE 2x10, 6" RLE 2x10  Sit to stands from chair- 2x10  7/24  Nustep L4x8 minutes UE and LE  All standing exercise (only sit as needed due to back discomfort, working on standing tolerance)   Tandem reach outside BOS 30sec ea position NBOS with head nods/turns 2x30 sec ea-standing on airex Mini squat at rail 2x10 Standing HR 2x20 Standing march 2x20 Gait with SPC 237 ft Sidestepping at rail YTB at knees 3x  Step ups 4" L LE 2x10, 6" RLE 2x10  Sit to stands from chair- 2x10  PATIENT EDUCATION:  Education details: anatomy, exercise progression, DOMS expectations, envelope of function, HEP, POC  Person educated:  Patient Education method: Explanation, Demonstration, Tactile cues, Verbal cues, and Handouts Education comprehension: verbalized understanding, returned demonstration, verbal cues required, and tactile cues required  HOME EXERCISE PROGRAM: Access Code: [email or web address] URL: [email or web address] Date: 08/11/2022 Prepared by: Zebedee Iba  Updated 11/18/22  ASSESSMENT:  CLINICAL IMPRESSION: Began session on nustep for dynamic warm up and conditioning. Continued with balance and strengthening exercises which are tolerated well with UE support. CGA provided with steps for balance although patient only with mild unsteadiness at times but no loss of balance. Patient will continue to benefit from physical therapy in order to improve function and reduce impairment.     OBJECTIVE IMPAIRMENTS: Abnormal gait, decreased activity tolerance, decreased balance, decreased endurance, decreased knowledge of use of DME, decreased mobility, difficulty walking, decreased ROM, decreased strength, hypomobility, impaired flexibility, improper body mechanics, postural dysfunction, and pain.    ACTIVITY LIMITATIONS: carrying, lifting, bending, standing, squatting, stairs, transfers, bed mobility, and locomotion level   PARTICIPATION LIMITATIONS: meal prep, cleaning, laundry, driving, shopping, and community activity and exercise   PERSONAL FACTORS: Age, Fitness, Time since onset of injury/illness/exacerbation, and 1-2 comorbidities:    are also affecting patient's functional outcome.    REHAB POTENTIAL: Fair     CLINICAL DECISION MAKING: Evolving/moderate complexity   EVALUATION COMPLEXITY: Moderate     GOALS:     SHORT TERM GOALS: Target date: 09/22/2022    Pt will become independent with HEP in order to  demonstrate synthesis of PT education.    Goal status: met   2.  Pt will be able to perform 5XSTS in under 12s  in order to demonstrate functional improvement above the cut off score for  adults.   Goal status: ongoing   3.  Pt will be able to demonstrate ability to perform gait with upright posture and AD  in order to demonstrate functional improvement in LE function for safety with community ambulation    Goal status: met       LONG TERM GOALS: Target date:02/08/2023    Pt will be able to demonstrate TUG in under 10 sec in order to demonstrate functional improvement in LE function, strength, balance, and mobility for safety with community ambulation.   Goal status: ongoing   Pt will decrease 5XSTS by at least 3 seconds in order to demonstrate clinically significant improvement in LE strength    Goal status: ongoing   3.  Pt will be able to demonstrate/report ability to walk >10 mins in order to demonstrate functional improvement and tolerance to exercise and community mobility.    Goal status: MET   4.  Pt will score >/= 57 on FOTO to demonstrate improvement in perceived mobility and function.    Goal status: ongoing        PLAN:   PT FREQUENCY: 1-2x/week   PT DURATION: 12 weeks   PLANNED INTERVENTIONS: Therapeutic exercises, Therapeutic activity, Neuromuscular re-education, Balance training, Gait training, Patient/Family education, Self Care, Joint mobilization, Joint manipulation, Stair training, Prosthetic training, DME instructions, Aquatic Therapy, Dry Needling, Electrical stimulation, Spinal manipulation, Spinal mobilization, Moist heat, scar mobilization, Splintting, Taping, Vasopneumatic device, Traction, Ultrasound, Ionotophoresis 4mg /ml Dexamethasone, Manual therapy, and Re-evaluation   PLAN FOR NEXT SESSION: LE exercise for transfers/transfer mechanics, consider resistance circuits, standing balance exercise, functional activity tolerance    Reola Mosher Kadeisha Betsch, PT 11/20/2022, 10:49 AM

## 2022-11-25 ENCOUNTER — Ambulatory Visit (HOSPITAL_BASED_OUTPATIENT_CLINIC_OR_DEPARTMENT_OTHER): Payer: Medicare HMO | Attending: Oncology

## 2022-11-25 ENCOUNTER — Encounter (HOSPITAL_BASED_OUTPATIENT_CLINIC_OR_DEPARTMENT_OTHER): Payer: Self-pay

## 2022-11-25 DIAGNOSIS — M6281 Muscle weakness (generalized): Secondary | ICD-10-CM | POA: Diagnosis not present

## 2022-11-25 DIAGNOSIS — R262 Difficulty in walking, not elsewhere classified: Secondary | ICD-10-CM | POA: Diagnosis not present

## 2022-11-25 NOTE — Therapy (Signed)
OUTPATIENT PHYSICAL THERAPY LOWER EXTREMITY TREATMENT   Patient Name: Nancy Harrison MRN: [id number] DOB:05/29/1940, 82 y.o., female Today's Date: 11/25/2022  END OF SESSION:  PT End of Session - 11/25/22 1426     Visit Number 18    Number of Visits 28    Date for PT Re-Evaluation 02/08/23    Authorization Type [employer] MCR    Progress Note Due on Visit 19    PT Start Time 1432    PT Stop Time 1516    PT Time Calculation (min) 44 min    Equipment Utilized During Treatment --    Activity Tolerance Patient tolerated treatment well    Behavior During Therapy WFL for tasks assessed/performed                       Past Medical History:  Diagnosis Date   Arthritis    Barrett's esophagus    Path 10/2001   Breast cancer (HCC) 01/01/2011   Cancer (HCC) 1999   rt breast   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   Cataract    Family history of colon cancer    Gastrostomy in place [facility])    LUQ - placed 10/16/2021   Hypertension    Ileostomy, has currently (HCC)    placed 09/2021   SUI (stress urinary incontinence, female)    Thyroid disease    Urinary urgency    Past Surgical History:  Procedure Laterality Date   BREAST LUMPECTOMY  1999   Chemo and radiation on right breast   IR IMAGING GUIDED PORT INSERTION  11/11/2021   LAPAROSCOPY N/A 10/16/2021   Procedure: LAPAROSCOPIC  DIVERTING OSTOMY, BIOSPY,  G-TUBE PLACEMENT;  Surgeon: Karie Soda, MD;  Location: [facility];  Service: General;  Laterality: N/A;   TONSILLECTOMY  1957   TOTAL KNEE ARTHROPLASTY  1999 & 2001   Patient Active Problem List   Diagnosis Date Noted   Bilateral inguinal hernia without obstruction or gangrene 11/12/2022   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   Acute renal failure (HCC) 05/30/2022   Non-recurrent unilateral inguinal hernia without obstruction or gangrene 04/13/2022   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   Non-recurrent bilateral inguinal hernia without obstruction or gangrene  04/06/2022   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   Genetic testing 12/03/2021   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   Nausea & vomiting 11/06/2021   Hyponatremia 11/06/2021   Family history of colon cancer 11/05/2021   ARF (acute renal failure) (HCC) 11/05/2021   Irritant contact dermatitis associated with digestive stoma    Gastrostomy tube dysfunction (HCC)    Irritant contact dermatitis associated with fecal stoma    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 10/17/2021   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) 10/17/2021   Ileostomy care (HCC) 10/17/2021   Colonic mass 10/15/2021   History of breast cancer 12/30/2011   Leukopenia 04/09/2011   Anxiety state 04/06/2007   Esophageal reflux 04/06/2007   Essential hypertension 04/06/2007   Hyperlipidemia 04/06/2007   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 Felt like it was something she  needed to do. A little muscle ache today.  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 No: NPRS scale: 0/10 Pain location: R hip Pain description: aching  Aggravating factors: "when I move my leg the wrong way it makes it hurt in my back"  (demonstrates marching) Relieving factors: not doing the motion that hurts   PRECAUTIONS: None  WEIGHT BEARING RESTRICTIONS: No  FALLS:  Has patient fallen in  last 6 months? No  LIVING ENVIRONMENT: Lives with: lives with their spouse Lives in: House/apartment, has basement  (usually goes down there 1x/month)  Stairs: yes , 6 steps to enter with step to pattern  Has following equipment at home: Single point cane, walker, shower grab bars   OCCUPATION: N/A Leisure: reading, computer games   PLOF: Independent with basic ADLs  PATIENT GOALS: getting her strength back,    OBJECTIVE:   DIAGNOSTIC FINDINGS: no relevant MSK imaging  PATIENT SURVEYS:  FOTO 41 52 @ DC 8 pts MCII  FOTO 7/18 47 FOTO 7/30: 42  9/18 FOTO     LOWER EXTREMITY MMT:  MMT Right 7/18 9/18 Left 7/18 9/18  Hip flexion 4/5 4+/5 4/5 4/5  Hip extension      Hip abduction 4/5  4/5   Hip adduction 4/5 4/5 4/5 4/5 p! Into back  Hip internal rotation      Hip external rotation      Knee flexion 4-/5 4/5 4-/5 4/5  Knee extension 4/5 4+/5 4/5 4/5 p! Into back  Ankle dorsiflexion      Ankle plantarflexion 4/5  4/5   Ankle inversion      Ankle eversion       (Blank rows = not tested)  FUNCTIONAL TESTS:  5 times sit to stand: 15.2s  Timed up and go (TUG): 14.3s  7/18 5XSTS 14.5s   7/18 TUG 12.9s  9/18 TUG 14.2s 9/18 5XSTS 11.9s  4 stage balance: passed NBOS, semi tandem, unable to pass tandem <9s   GAIT: Distance walked: 29ft Assistive device utilized: Single point cane Level of assistance: Complete Independence Comments: shortened step length, shuffling gait, low foot clearance, fatigued, requires seated rest break   TODAY'S TREATMENT:                                                                                                                              DATE:   11/25/22 Nustep L4x5 minutes UE and LE Gait- 240 ft with SPC Sidestepping at rail with YTB at ankles x2 laps Standing hip abduction YTB at knees 2 x 10 bilateral  Standing hip extension YTB at knees 2 x 10 bilateral  Hurdles fwd- step to x4hurdles- 2 laps Trialed LAQ but had pain  in L low back LTR x10 Supine HS and ITB stretch L 11/20/22 Nustep  L4x5 minutes UE and LE Standing hip abduction YTB at knees 2 x 10 bilateral  Standing hip extension YTB at knees 2 x 10 bilateral  Lateral step up 6 inch 1 x 10 bilateral  LAQ 1# 1 x 10 with 5 second holds Standing alternating march 2 x 10 bilateral Tandem stance 2 x 30 second holds bilateral Seated hip adduction 2 x 10 x 10 second holds  11/18/22 Nustep L3x5 minutes UE and LE Tandem stance 2 x 20 -30 second holds bilateral SLS with 3 way vectors with unilateral HHA 3 x 5 second holds bilateral  Lateral step up 6 inch 1 x 10 bilateral  Lateral stepping with perpendicular resistance 2 x 5 RTB bilateral  9/18  Nustep L3x5 minutes UE and LE  Supine bridge 2x10 SKTC stretch 5s 10x  Standing march 2x20 Seated QL 30s 2x  9/12  Nustep L5x8 minutes UE and LE  6" box step ups x10 each HR/TR 20x Mini squat at rail 2x10 Gait with SPC 1 lap x2 (276ft)  8/1  Nustep L4x8 minutes UE and LE  White leg press 70lbs 4x4 YTB paloff 2x10 each side 6" box step ups 2x10 each HR/TR 20x  PATIENT EDUCATION:  Education details: anatomy, exercise progression, DOMS expectations, envelope of function, HEP, POC  Person educated: Patient Education method: Explanation, Demonstration, Tactile cues, Verbal cues, and Handouts Education comprehension: verbalized understanding, returned demonstration, verbal cues required, and tactile cues required  HOME EXERCISE PROGRAM: Access Code: [email or web address] URL: [email or web address] Date: 08/11/2022 Prepared by: Zebedee Iba  Updated 11/18/22  ASSESSMENT:  CLINICAL IMPRESSION: Trialed hurdle step overs to work on obstacle clearance. Did require some cues for avoiding circumduction. Some L sided low back pain with standing hurdle step overs when elevating L LE. Felt relief following side bend stretching and ITB/HSS as well as LTR. Gave pt copy of stretches to complete at home to  improve tightness and pain here.      OBJECTIVE IMPAIRMENTS: Abnormal gait, decreased activity tolerance, decreased balance, decreased endurance, decreased knowledge of use of DME, decreased mobility, difficulty walking, decreased ROM, decreased strength, hypomobility, impaired flexibility, improper body mechanics, postural dysfunction, and pain.    ACTIVITY LIMITATIONS: carrying, lifting, bending, standing, squatting, stairs, transfers, bed mobility, and locomotion level   PARTICIPATION LIMITATIONS: meal prep, cleaning, laundry, driving, shopping, and community activity and exercise   PERSONAL FACTORS: Age, Fitness, Time since onset of injury/illness/exacerbation, and 1-2 comorbidities:    are also affecting patient's functional outcome.    REHAB POTENTIAL: Fair     CLINICAL DECISION MAKING: Evolving/moderate complexity   EVALUATION COMPLEXITY: Moderate     GOALS:     SHORT TERM GOALS: Target date: 09/22/2022    Pt will become independent with HEP in order to demonstrate synthesis of PT education.    Goal status: met   2.  Pt will be able to perform 5XSTS in under 12s  in order to demonstrate functional improvement above the cut off score for adults.   Goal status: ongoing   3.  Pt will be able to demonstrate ability to perform gait with upright posture and AD  in order to demonstrate functional improvement in LE function for safety with community ambulation    Goal status: met       LONG TERM GOALS: Target date:02/08/2023    Pt will be able to demonstrate TUG in under 10 sec in order to demonstrate functional improvement in LE function, strength, balance, and mobility for safety with community ambulation.  Goal status: ongoing   Pt will decrease 5XSTS by at least 3 seconds in order to demonstrate clinically significant improvement in LE strength    Goal status: ongoing   3.  Pt will be able to demonstrate/report ability to walk >10 mins in order to demonstrate  functional improvement and tolerance to exercise and community mobility.    Goal status: MET   4.  Pt will score >/= 57 on FOTO to demonstrate improvement in perceived mobility and function.    Goal status: ongoing        PLAN:   PT FREQUENCY: 1-2x/week   PT DURATION: 12 weeks   PLANNED INTERVENTIONS: Therapeutic exercises, Therapeutic activity, Neuromuscular re-education, Balance training, Gait training, Patient/Family education, Self Care, Joint mobilization, Joint manipulation, Stair training, Prosthetic training, DME instructions, Aquatic Therapy, Dry Needling, Electrical stimulation, Spinal manipulation, Spinal mobilization, Moist heat, scar mobilization, Splintting, Taping, Vasopneumatic device, Traction, Ultrasound, Ionotophoresis 4mg /ml Dexamethasone, Manual therapy, and Re-evaluation   PLAN FOR NEXT SESSION: LE exercise for transfers/transfer mechanics, consider resistance circuits, standing balance exercise, functional activity tolerance    Donnel Saxon Romulo Okray, PTA 11/25/2022, 5:20 PM

## 2022-11-26 ENCOUNTER — Inpatient Hospital Stay: Payer: Medicare HMO

## 2022-11-26 ENCOUNTER — Inpatient Hospital Stay: Payer: Medicare HMO | Admitting: Oncology

## 2022-11-26 ENCOUNTER — Inpatient Hospital Stay: Payer: Medicare HMO | Attending: Oncology

## 2022-11-26 ENCOUNTER — Encounter: Payer: Self-pay | Admitting: *Deleted

## 2022-11-26 VITALS — BP 139/68 | HR 83

## 2022-11-26 DIAGNOSIS — C481 Malignant neoplasm of specified parts of peritoneum: Secondary | ICD-10-CM | POA: Insufficient documentation

## 2022-11-26 DIAGNOSIS — Z23 Encounter for immunization: Secondary | ICD-10-CM | POA: Diagnosis not present

## 2022-11-26 DIAGNOSIS — Z95828 Presence of other vascular implants and grafts: Secondary | ICD-10-CM

## 2022-11-26 DIAGNOSIS — Z5112 Encounter for antineoplastic immunotherapy: Secondary | ICD-10-CM | POA: Insufficient documentation

## 2022-11-26 LAB — CBC WITH DIFFERENTIAL (CANCER CENTER ONLY)
Abs Immature Granulocytes: 0.02 10*3/uL (ref 0.00–0.07)
Basophils Absolute: 0 10*3/uL (ref 0.0–0.1)
Basophils Relative: 1 %
Eosinophils Absolute: 0.2 10*3/uL (ref 0.0–0.5)
Eosinophils Relative: 3 %
HCT: 39 % (ref 36.0–46.0)
Hemoglobin: 12.6 g/dL (ref 12.0–15.0)
Immature Granulocytes: 0 %
Lymphocytes Relative: 24 %
Lymphs Abs: 1.2 10*3/uL (ref 0.7–4.0)
MCH: 34 pg (ref 26.0–34.0)
MCHC: 32.3 g/dL (ref 30.0–36.0)
MCV: 105.1 fL — ABNORMAL HIGH (ref 80.0–100.0)
Monocytes Absolute: 0.5 10*3/uL (ref 0.1–1.0)
Monocytes Relative: 10 %
Neutro Abs: 3.1 10*3/uL (ref 1.7–7.7)
Neutrophils Relative %: 62 %
Platelet Count: 202 10*3/uL (ref 150–400)
RBC: 3.71 MIL/uL — ABNORMAL LOW (ref 3.87–5.11)
RDW: 14.6 % (ref 11.5–15.5)
WBC Count: 5.1 10*3/uL (ref 4.0–10.5)
nRBC: 0 % (ref 0.0–0.2)

## 2022-11-26 LAB — CMP (CANCER CENTER ONLY)
ALT: 12 U/L (ref 0–44)
AST: 20 U/L (ref 15–41)
Albumin: 4.1 g/dL (ref 3.5–5.0)
Alkaline Phosphatase: 49 U/L (ref 38–126)
Anion gap: 7 (ref 5–15)
BUN: 17 mg/dL (ref 8–23)
CO2: 27 mmol/L (ref 22–32)
Calcium: 9.5 mg/dL (ref 8.9–10.3)
Chloride: 104 mmol/L (ref 98–111)
Creatinine: 0.66 mg/dL (ref 0.44–1.00)
GFR, Estimated: >60 mL/min (ref >60)
Glucose, Bld: 111 mg/dL — ABNORMAL HIGH (ref 70–99)
Potassium: 3.9 mmol/L (ref 3.5–5.1)
Sodium: 138 mmol/L (ref 135–145)
Total Bilirubin: 0.5 mg/dL (ref 0.3–1.2)
Total Protein: 6.9 g/dL (ref 6.5–8.1)

## 2022-11-26 LAB — TOTAL PROTEIN, URINE DIPSTICK: Protein, ur: NEGATIVE mg/dL

## 2022-11-26 MED ORDER — SODIUM CHLORIDE 0.9 % IV SOLN: Freq: Once | INTRAVENOUS | Status: AC

## 2022-11-26 MED ORDER — HEPARIN SOD (PORK) LOCK FLUSH 100 UNIT/ML IV SOLN
500.0000 [IU] | Freq: Once | INTRAVENOUS | Status: AC | PRN
  Administered 2022-11-26: 500 [IU]

## 2022-11-26 MED ORDER — SODIUM CHLORIDE 0.9% FLUSH
10.0000 mL | INTRAVENOUS | Status: DC | PRN
  Administered 2022-11-26: 10 mL

## 2022-11-26 MED ORDER — SODIUM CHLORIDE 0.9 % IV SOLN
15.0000 mg/kg | Freq: Once | INTRAVENOUS | Status: AC
  Administered 2022-11-26: 800 mg via INTRAVENOUS
  Filled 2022-11-26: qty 32

## 2022-11-26 MED ORDER — INFLUENZA VAC A&B SURF ANT ADJ 0.5 ML IM SUSY
0.5000 mL | PREFILLED_SYRINGE | Freq: Once | INTRAMUSCULAR | Status: AC
  Administered 2022-11-26: 0.5 mL via INTRAMUSCULAR
  Filled 2022-11-26: qty 0.5

## 2022-11-26 NOTE — Patient Instructions (Signed)
[facility]   Discharge Instructions: Thank you for choosing [facility] to provide your oncology and hematology care.   If you have a lab appointment with the Cancer Center, please go directly to the Cancer Center and check in at the registration area.   Wear comfortable clothing and clothing appropriate for easy access to any Portacath or PICC line.   We strive to give you quality time with your provider. You may need to reschedule your appointment if you arrive late (15 or more minutes).  Arriving late affects you and other patients whose appointments are after yours.  Also, if you miss three or more appointments without notifying the office, you may be dismissed from the clinic at the provider's discretion.      For prescription refill requests, have your pharmacy contact our office and allow 72 hours for refills to be completed.    Today you received the following chemotherapy and/or immunotherapy agents Bevacizumab-bvzr (ZIRABEV).      To help prevent nausea and vomiting after your treatment, we encourage you to take your nausea medication as directed.  BELOW ARE SYMPTOMS THAT SHOULD BE REPORTED IMMEDIATELY: *FEVER GREATER THAN 100.4 F (38 C) OR HIGHER *CHILLS OR SWEATING *NAUSEA AND VOMITING THAT IS NOT CONTROLLED WITH YOUR NAUSEA MEDICATION *UNUSUAL SHORTNESS OF BREATH *UNUSUAL BRUISING OR BLEEDING *URINARY PROBLEMS (pain or burning when urinating, or frequent urination) *BOWEL PROBLEMS (unusual diarrhea, constipation, pain near the anus) TENDERNESS IN MOUTH AND THROAT WITH OR WITHOUT PRESENCE OF ULCERS (sore throat, sores in mouth, or a toothache) UNUSUAL RASH, SWELLING OR PAIN  UNUSUAL VAGINAL DISCHARGE OR ITCHING   Items with * indicate a potential emergency and should be followed up as soon as possible or go to the Emergency Department if any problems should occur.  Please show the CHEMOTHERAPY ALERT CARD or IMMUNOTHERAPY  ALERT CARD at check-in to the Emergency Department and triage nurse.  Should you have questions after your visit or need to cancel or reschedule your appointment, please contact [facility]  Dept: [phone]  and follow the prompts.  Office hours are 8:00 a.m. to 4:30 p.m. Monday - Friday. Please note that voicemails left after 4:00 p.m. may not be returned until the following business day.  We are closed weekends and major holidays. You have access to a nurse at all times for urgent questions. Please call the main number to the clinic Dept: [phone] and follow the prompts.   For any non-urgent questions, you may also contact your provider using MyChart. We now offer e-Visits for anyone 23 and older to request care online for non-urgent symptoms. For details visit [email or web address].   Also download the MyChart app! Go to the app store, search "MyChart", open the app, select , and log in with your MyChart username and password.  Bevacizumab Injection What is this medication? BEVACIZUMAB (be va SIZ yoo mab) treats some types of cancer. It works by blocking a protein that causes cancer cells to grow and multiply. This helps to slow or stop the spread of cancer cells. It is a monoclonal antibody. This medicine may be used for other purposes; ask your health care provider or pharmacist if you have questions. COMMON BRAND NAME(S): Alymsys, Avastin, MVASI, Omer Jack What should I tell my care team before I take this medication? They need to know if you have any of these conditions: Blood clots Coughing up blood Having or recent surgery Heart  failure High blood pressure History of a connection between 2 or more body parts that do not usually connect (fistula) History of a tear in your stomach or intestines Protein in your urine An unusual or allergic reaction to bevacizumab, other medications, foods, dyes, or preservatives Pregnant or trying to  get pregnant Breast-feeding How should I use this medication? This medication is injected into a vein. It is given by your care team in a hospital or clinic setting. Talk to your care team the use of this medication in children. Special care may be needed. Overdosage: If you think you have taken too much of this medicine contact a poison control center or emergency room at once. NOTE: This medicine is only for you. Do not share this medicine with others. What if I miss a dose? Keep appointments for follow-up doses. It is important not to miss your dose. Call your care team if you are unable to keep an appointment. What may interact with this medication? Interactions are not expected. This list may not describe all possible interactions. Give your health care provider a list of all the medicines, herbs, non-prescription drugs, or dietary supplements you use. Also tell them if you smoke, drink alcohol, or use illegal drugs. Some items may interact with your medicine. What should I watch for while using this medication? Your condition will be monitored carefully while you are receiving this medication. You may need blood work while taking this medication. This medication may make you feel generally unwell. This is not uncommon as chemotherapy can affect healthy cells as well as cancer cells. Report any side effects. Continue your course of treatment even though you feel ill unless your care team tells you to stop. This medication may increase your risk to bruise or bleed. Call your care team if you notice any unusual bleeding. Before having surgery, talk to your care team to make sure it is ok. This medication can increase the risk of poor healing of your surgical site or wound. You will need to stop this medication for 28 days before surgery. After surgery, wait at least 28 days before restarting this medication. Make sure the surgical site or wound is healed enough before restarting this medication.  Talk to your care team if questions. Talk to your care team if you may be pregnant. Serious birth defects can occur if you take this medication during pregnancy and for 6 months after the last dose. Contraception is recommended while taking this medication and for 6 months after the last dose. Your care team can help you find the option that works for you. Do not breastfeed while taking this medication and for 6 months after the last dose. This medication can cause infertility. Talk to your care team if you are concerned about your fertility. What side effects may I notice from receiving this medication? Side effects that you should report to your care team as soon as possible: Allergic reactions--skin rash, itching, hives, swelling of the face, lips, tongue, or throat Bleeding--bloody or black, tar-like stools, vomiting blood or brown material that looks like coffee grounds, red or dark brown urine, small red or purple spots on skin, unusual bruising or bleeding Blood clot--pain, swelling, or warmth in the leg, shortness of breath, chest pain Heart attack--pain or tightness in the chest, shoulders, arms, or jaw, nausea, shortness of breath, cold or clammy skin, feeling faint or lightheaded Heart failure--shortness of breath, swelling of the ankles, feet, or hands, sudden weight gain, unusual weakness or  fatigue Increase in blood pressure Infection--fever, chills, cough, sore throat, wounds that don't heal, pain or trouble when passing urine, general feeling of discomfort or being unwell Infusion reactions--chest pain, shortness of breath or trouble breathing, feeling faint or lightheaded Kidney injury--decrease in the amount of urine, swelling of the ankles, hands, or feet Stomach pain that is severe, does not go away, or gets worse Stroke--sudden numbness or weakness of the face, arm, or leg, trouble speaking, confusion, trouble walking, loss of balance or coordination, dizziness, severe headache,  change in vision Sudden and severe headache, confusion, change in vision, seizures, which may be signs of posterior reversible encephalopathy syndrome (PRES) Side effects that usually do not require medical attention (report to your care team if they continue or are bothersome): Back pain Change in taste Diarrhea Dry skin Increased tears Nosebleed This list may not describe all possible side effects. Call your doctor for medical advice about side effects. You may report side effects to FDA at 1-800-FDA-1088. Where should I keep my medication? This medication is given in a hospital or clinic. It will not be stored at home. NOTE: This sheet is a summary. It may not cover all possible information. If you have questions about this medicine, talk to your doctor, pharmacist, or health care provider.  2024 Elsevier/Gold Standard (2021-06-26 00:00:00)

## 2022-11-26 NOTE — Progress Notes (Signed)
[facility] OFFICE PROGRESS NOTE   Diagnosis: Peritoneal carcinoma  INTERVAL HISTORY:   [name] returns as scheduled.  She completed another treatment with bevacizumab on 11/05/2022.  No bleeding or symptom of thrombosis.  She is currently emptying the ostomy bag twice daily.  Objective:  Vital signs in last 24 hours:  Blood pressure 120/84, pulse 96, temperature 98.2 F (36.8 C), temperature source Temporal, resp. rate 20, height 4\' 11"  (1.499 m), weight 118 lb 4.8 oz (53.7 kg), SpO2 96%.    HEENT: No thrush or ulcers Resp: Lungs clear bilaterally Cardio: Regular rate and rhythm GI: Right abdomen ileostomy with brown semiformed stool, no hepatosplenomegaly Vascular: No leg edema    Portacath/PICC-without erythema  Lab Results:  Lab Results  Component Value Date   WBC 5.1 11/26/2022   HGB 12.6 11/26/2022   HCT 39.0 11/26/2022   MCV 105.1 (H) 11/26/2022   PLT 202 11/26/2022   NEUTROABS 3.1 11/26/2022    CMP  Lab Results  Component Value Date   NA 141 11/05/2022   K 3.7 11/05/2022   CL 106 11/05/2022   CO2 27 11/05/2022   GLUCOSE 104 (H) 11/05/2022   BUN 20 11/05/2022   CREATININE 0.72 11/05/2022   CALCIUM 9.4 11/05/2022   PROT 6.7 11/05/2022   ALBUMIN 3.9 11/05/2022   AST 24 11/05/2022   ALT 16 11/05/2022   ALKPHOS 49 11/05/2022   BILITOT 0.5 11/05/2022   GFRNONAA >60 11/05/2022    Lab Results  Component Value Date   CEA1 2.5 10/18/2021    Medications: I have reviewed the patient's current medications.   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  Bone lesions concerning for metastases, right lung nodule Laparoscopy, diverting loop  ileostomy, gastrostomy tube placement, peritoneal biopsy 10/16/2021, no evidence of a primary tumor site at the appendix, ovaries, or uterus.  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  CDX2, CK20, and GATA3 negative.  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 Foundation 1-MSS, tumor mutation burden 5, HRD positive-LOH score 34.1% 08/11/2021 CA125 818 CT chest 11/03/2021-10 mm right lower lobe nodule, scattered tiny pulmonary nodules, peritoneal carcinomatosis Cycle 1 Taxol/carboplatin 11/12/2021 Cycle 2 Taxol/carboplatin 12/03/2021 Cycle 3 Taxol/carboplatin 12/24/2021 Bone scan 01/05/2022-negative for metastatic disease Cycle 4 Taxol/carboplatin 01/13/2022 CTs 01/22/2022-reduction in peritoneal carcinomatosis, no evidence of progressive disease stable bilateral lower lobe pulmonary nodules Cycle 5 Taxol/carboplatin 02/03/2022 Cycle 6 Taxol/carboplatin 02/24/2022 CT abdomen/pelvis 04/19/2022-slight decrease in peritoneal carcinomatosis with residual lesions 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   Abdominal distention/pain and diarrhea secondary to #1 3.   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  Lumpectomy and adjuvant CMF x8 followed by radiation 4. Hypertension 5. Hypothyroidism 6. Barrett's esophagus 7. Family history of multiple cancers including appendix, colon, and lung cancer 8.   Hospital admission 11/05/2021 with dehydration/prerenal azotemia secondary to nausea and high  output ileostomy 9.  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 Admission 05/29/2022 with dehydration and acute renal injury     Disposition: Nancy Harrison appears stable.  She is tolerating the bevacizumab and olaparib well.  She will continue the current treatment.  We will follow-up on the CA125 from today.  She will return for an office visit and bevacizumab in 3 weeks.  Mr we will receive an influenza vaccine today.  Nancy Papas, MD  11/26/2022  10:16 AM

## 2022-11-26 NOTE — Progress Notes (Signed)
Patient seen by Dr. Thornton Papas today  Vitals are within treatment parameters:Yes   Labs are within treatment parameters: Yes   Treatment plan has been signed: Yes   Per physician team, Patient is ready for treatment and there are NO modifications to the treatment plan.

## 2022-11-26 NOTE — Patient Instructions (Signed)

## 2022-11-27 ENCOUNTER — Encounter (HOSPITAL_BASED_OUTPATIENT_CLINIC_OR_DEPARTMENT_OTHER): Payer: Self-pay | Admitting: Physical Therapy

## 2022-11-27 ENCOUNTER — Ambulatory Visit (HOSPITAL_BASED_OUTPATIENT_CLINIC_OR_DEPARTMENT_OTHER): Payer: Medicare HMO | Admitting: Physical Therapy

## 2022-11-27 DIAGNOSIS — M6281 Muscle weakness (generalized): Secondary | ICD-10-CM

## 2022-11-27 DIAGNOSIS — R262 Difficulty in walking, not elsewhere classified: Secondary | ICD-10-CM

## 2022-11-27 LAB — CA 125: Cancer Antigen (CA) 125: 27.5 U/mL (ref 0.0–38.1)

## 2022-11-27 NOTE — Therapy (Signed)
OUTPATIENT PHYSICAL THERAPY LOWER EXTREMITY TREATMENT   Patient Name: Nancy Harrison MRN: [id number] DOB:1940/10/06, 82 y.o., female Today's Date: 11/27/2022  END OF SESSION:  PT End of Session - 11/27/22 0828     Visit Number 19    Number of Visits 28    Date for PT Re-Evaluation 02/08/23    Authorization Type [employer] MCR    Progress Note Due on Visit 19    PT Start Time 0830    PT Stop Time 0911    PT Time Calculation (min) 41 min    Activity Tolerance Patient tolerated treatment well    Behavior During Therapy [facility] for tasks assessed/performed                       Past Medical History:  Diagnosis Date   Arthritis    Barrett's esophagus    Path 10/2001   Breast cancer (HCC) 01/01/2011   Cancer (HCC) 1999   rt breast   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   Cataract    Family history of colon cancer    Gastrostomy in place [facility])    LUQ - placed 10/16/2021   Hypertension    Ileostomy, has currently (HCC)    placed 09/2021   SUI (stress urinary incontinence, female)    Thyroid disease    Urinary urgency    Past Surgical History:  Procedure Laterality Date   BREAST LUMPECTOMY  1999   Chemo and radiation on right breast   IR IMAGING GUIDED PORT INSERTION  11/11/2021   LAPAROSCOPY N/A 10/16/2021   Procedure: LAPAROSCOPIC  DIVERTING OSTOMY, BIOSPY,  G-TUBE PLACEMENT;  Surgeon: Karie Soda, MD;  Location: [facility];  Service: General;  Laterality: N/A;   TONSILLECTOMY  1957   TOTAL KNEE ARTHROPLASTY  1999 & 2001   Patient Active Problem List   Diagnosis Date Noted   Bilateral inguinal hernia without obstruction or gangrene 11/12/2022   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   Acute renal failure (HCC) 05/30/2022   Non-recurrent unilateral inguinal hernia without obstruction or gangrene 04/13/2022   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   Non-recurrent bilateral inguinal hernia without obstruction or gangrene 04/06/2022   Ileostomy prolapse (HCC)  03/30/2022   Genetic testing 12/03/2021   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   Nausea & vomiting 11/06/2021   Hyponatremia 11/06/2021   Family history of colon cancer 11/05/2021   ARF (acute renal failure) (HCC) 11/05/2021   Irritant contact dermatitis associated with digestive stoma    Gastrostomy tube dysfunction (HCC)    Irritant contact dermatitis associated with fecal stoma    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 10/17/2021   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) 10/17/2021   Ileostomy care (HCC) 10/17/2021   Colonic mass 10/15/2021   History of breast cancer 12/30/2011   Leukopenia 04/09/2011   Anxiety state 04/06/2007   Esophageal reflux 04/06/2007   Essential hypertension 04/06/2007   Hyperlipidemia 04/06/2007   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 No pain today. Felt that stretches were helpful.   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 No: NPRS scale: 0/10 Pain location: R hip Pain description: aching  Aggravating factors: "when I move my leg the wrong way it makes it hurt in my back"  (demonstrates marching) Relieving factors: not doing the motion that hurts   PRECAUTIONS: None  WEIGHT BEARING RESTRICTIONS: No  FALLS:  Has patient fallen in last 6 months? No  LIVING ENVIRONMENT: Lives with:  lives with their spouse Lives in: House/apartment, has basement  (usually goes down there 1x/month)  Stairs: yes , 6 steps to enter with step to pattern  Has following equipment at home: Single point cane, walker, shower grab bars   OCCUPATION: N/A Leisure: reading, computer games   PLOF: Independent with basic ADLs  PATIENT GOALS: getting her strength back,    OBJECTIVE:   DIAGNOSTIC FINDINGS: no relevant MSK imaging  PATIENT SURVEYS:  FOTO 41 52 @ DC 8 pts MCII  FOTO 7/18 47 FOTO 7/30: 42  9/18 FOTO     LOWER EXTREMITY MMT:  MMT Right 7/18 9/18 Left 7/18 9/18  Hip flexion 4/5 4+/5 4/5 4/5  Hip extension      Hip abduction 4/5  4/5   Hip adduction 4/5 4/5 4/5 4/5 p! Into back  Hip internal rotation      Hip external rotation      Knee flexion 4-/5 4/5 4-/5 4/5  Knee extension 4/5 4+/5 4/5 4/5 p! Into back  Ankle dorsiflexion      Ankle plantarflexion 4/5  4/5   Ankle inversion      Ankle eversion       (Blank rows = not tested)  FUNCTIONAL TESTS:  5 times sit to stand: 15.2s  Timed up and go (TUG): 14.3s  7/18 5XSTS 14.5s   7/18 TUG 12.9s  9/18 TUG 14.2s 9/18 5XSTS 11.9s  4 stage balance: passed NBOS, semi tandem, unable to pass tandem <9s   GAIT: Distance walked: 12ft Assistive device utilized: Single point cane Level of assistance: Complete Independence Comments: shortened step length, shuffling gait, low foot clearance, fatigued, requires seated rest break   TODAY'S TREATMENT:                                                                                                                              DATE:   11/27/22 Nustep L4x5 minutes UE and LE Sidestepping at rail with YTB at ankles x4 laps Standing hip abduction YTB at ankles 2 x 10 bilateral  Standing hip extension YTB at ankles 2 x 10 bilateral  Lateral step up and over 6 inch 1 x 10 bilateral  Cone taps with minimal unilateral UE support 2 x 5 bilateral Seated trunk flexion  stretch with green ball 10 x 5-10 second holds Tandem stance 2 x 30 second holds bilateral  Standing row RTB 1 x 15  11/25/22 Nustep L4x5 minutes UE and LE Gait- 240 ft with SPC Sidestepping at rail with YTB at ankles x2 laps Standing hip abduction YTB at knees 2 x 10 bilateral  Standing hip extension YTB at knees 2 x 10 bilateral  Hurdles fwd- step to x4hurdles- 2 laps Trialed LAQ but had pain in L low back LTR x10 Supine HS and ITB stretch L  11/20/22 Nustep L4x5 minutes UE and LE Standing hip abduction YTB at knees 2 x 10 bilateral  Standing hip extension YTB at knees 2 x 10 bilateral  Lateral step up 6 inch 1 x 10 bilateral  LAQ 1# 1 x 10 with 5 second holds Standing alternating march 2 x 10 bilateral Tandem stance 2 x 30 second holds bilateral Seated hip adduction 2 x 10 x 10 second holds  11/18/22 Nustep L3x5 minutes UE and LE Tandem stance 2 x 20 -30 second holds bilateral SLS with 3 way vectors with unilateral HHA 3 x 5 second holds bilateral  Lateral step up 6 inch 1 x 10 bilateral  Lateral stepping with perpendicular resistance 2 x 5 RTB bilateral  9/18  Nustep L3x5 minutes UE and LE  Supine bridge 2x10 SKTC stretch 5s 10x  Standing march 2x20 Seated QL 30s 2x  9/12  Nustep L5x8 minutes UE and LE  6" box step ups x10 each HR/TR 20x Mini squat at rail 2x10 Gait with SPC 1 lap x2 (268ft)  8/1  Nustep L4x8 minutes UE and LE  White leg press 70lbs 4x4 YTB paloff 2x10 each side 6" box step ups 2x10 each HR/TR 20x  PATIENT EDUCATION:  Education details: anatomy, exercise progression, DOMS expectations, envelope of function, HEP, POC  Person educated: Patient Education method: Explanation, Demonstration, Tactile cues, Verbal cues, and Handouts Education comprehension: verbalized understanding, returned demonstration, verbal cues required, and tactile cues required  HOME EXERCISE PROGRAM: Access Code: [email or web address] URL:  [email or web address] Date: 08/11/2022 Prepared by: Zebedee Iba  Updated 11/18/22  ASSESSMENT:  CLINICAL IMPRESSION: Began session on nustep for dynamic warm up and endurance. Continued with LE strength and balance exercises which are tolerated well. Able to progress reps and intensity of previously completed exercises. She requires HHA for balance and strength with standing exercises completed. L hip pain and bilateral hip fatigue throughout session with exercises. Patient will continue to benefit from physical therapy in order to improve function and reduce impairment.     OBJECTIVE IMPAIRMENTS: Abnormal gait, decreased activity tolerance, decreased balance, decreased endurance, decreased knowledge of use of DME, decreased mobility, difficulty walking, decreased ROM, decreased strength, hypomobility, impaired flexibility, improper body mechanics, postural dysfunction, and pain.    ACTIVITY LIMITATIONS: carrying, lifting, bending, standing, squatting, stairs, transfers, bed mobility, and locomotion level   PARTICIPATION LIMITATIONS: meal prep, cleaning, laundry, driving, shopping, and community activity and exercise   PERSONAL FACTORS: Age, Fitness, Time since onset of injury/illness/exacerbation, and 1-2 comorbidities:    are also affecting patient's functional outcome.    REHAB POTENTIAL: Fair     CLINICAL DECISION MAKING: Evolving/moderate complexity   EVALUATION COMPLEXITY: Moderate     GOALS:     SHORT TERM GOALS: Target date: 09/22/2022    Pt will become independent with HEP in order to demonstrate synthesis of PT education.    Goal status: met   2.  Pt will be able to perform 5XSTS in under 12s  in order to demonstrate functional improvement above the cut off score for adults.  Goal status: ongoing   3.  Pt will be able to demonstrate ability to perform gait with upright posture and AD  in order to demonstrate functional improvement in LE function for  safety with community ambulation    Goal status: met       LONG TERM GOALS: Target date:02/08/2023    Pt will be able to demonstrate TUG in under 10 sec in order to demonstrate functional improvement in LE function, strength, balance, and mobility for safety with community ambulation.   Goal status: ongoing   Pt will decrease 5XSTS by at least 3 seconds in order to demonstrate clinically significant improvement in LE strength    Goal status: ongoing   3.  Pt will be able to demonstrate/report ability to walk >10 mins in order to demonstrate functional improvement and tolerance to exercise and community mobility.    Goal status: MET   4.  Pt will score >/= 57 on FOTO to demonstrate improvement in perceived mobility and function.    Goal status: ongoing        PLAN:   PT FREQUENCY: 1-2x/week   PT DURATION: 12 weeks   PLANNED INTERVENTIONS: Therapeutic exercises, Therapeutic activity, Neuromuscular re-education, Balance training, Gait training, Patient/Family education, Self Care, Joint mobilization, Joint manipulation, Stair training, Prosthetic training, DME instructions, Aquatic Therapy, Dry Needling, Electrical stimulation, Spinal manipulation, Spinal mobilization, Moist heat, scar mobilization, Splintting, Taping, Vasopneumatic device, Traction, Ultrasound, Ionotophoresis 4mg /ml Dexamethasone, Manual therapy, and Re-evaluation   PLAN FOR NEXT SESSION: LE exercise for transfers/transfer mechanics, consider resistance circuits, standing balance exercise, functional activity tolerance    Reola Mosher Chandrika Sandles, PT 11/27/2022, 9:11 AM

## 2022-11-29 ENCOUNTER — Encounter (HOSPITAL_BASED_OUTPATIENT_CLINIC_OR_DEPARTMENT_OTHER): Payer: Self-pay

## 2022-11-29 ENCOUNTER — Ambulatory Visit (HOSPITAL_BASED_OUTPATIENT_CLINIC_OR_DEPARTMENT_OTHER): Payer: Medicare HMO

## 2022-11-29 DIAGNOSIS — R262 Difficulty in walking, not elsewhere classified: Secondary | ICD-10-CM | POA: Diagnosis not present

## 2022-11-29 DIAGNOSIS — M6281 Muscle weakness (generalized): Secondary | ICD-10-CM

## 2022-11-29 NOTE — Therapy (Signed)
OUTPATIENT PHYSICAL THERAPY LOWER EXTREMITY TREATMENT   Patient Name: Nancy Harrison MRN: [id number] DOB:[birth year], 82 y.o., female Today's Date: 11/29/2022  END OF SESSION:  PT End of Session - 11/29/22 1541     Visit Number 20    Number of Visits 28    Date for PT Re-Evaluation 02/08/23    Authorization Type [employer] MCR    Progress Note Due on Visit 19    PT Start Time 1433    PT Stop Time 1512    PT Time Calculation (min) 39 min    Equipment Utilized During Treatment Gait belt    Activity Tolerance Patient tolerated treatment well    Behavior During Therapy WFL for tasks assessed/performed                        Past Medical History:  Diagnosis Date   Arthritis    Barrett's esophagus    Path 10/2001   Breast cancer (HCC) 01/01/2011   Cancer (HCC) 1999   rt breast   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   Cataract    Family history of colon cancer    Gastrostomy in place [facility])    LUQ - placed 10/16/2021   Hypertension    Ileostomy, has currently (HCC)    placed 09/2021   SUI (stress urinary incontinence, female)    Thyroid disease    Urinary urgency    Past Surgical History:  Procedure Laterality Date   BREAST LUMPECTOMY  1999   Chemo and radiation on right breast   IR IMAGING GUIDED PORT INSERTION  11/11/2021   LAPAROSCOPY N/A 10/16/2021   Procedure: LAPAROSCOPIC  DIVERTING OSTOMY, BIOSPY,  G-TUBE PLACEMENT;  Surgeon: Karie Soda, MD;  Location: [facility];  Service: General;  Laterality: N/A;   TONSILLECTOMY  1957   TOTAL KNEE ARTHROPLASTY  1999 & 2001   Patient Active Problem List   Diagnosis Date Noted   Bilateral inguinal hernia without obstruction or gangrene 11/12/2022   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   Acute renal failure (HCC) 05/30/2022   Non-recurrent unilateral inguinal hernia without obstruction or gangrene 04/13/2022   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   Non-recurrent bilateral inguinal hernia without obstruction or  gangrene 04/06/2022   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   Genetic testing 12/03/2021   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   Nausea & vomiting 11/06/2021   Hyponatremia 11/06/2021   Family history of colon cancer 11/05/2021   ARF (acute renal failure) (HCC) 11/05/2021   Irritant contact dermatitis associated with digestive stoma    Gastrostomy tube dysfunction (HCC)    Irritant contact dermatitis associated with fecal stoma    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 10/17/2021   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) 10/17/2021   Ileostomy care (HCC) 10/17/2021   Colonic mass 10/15/2021   History of breast cancer 12/30/2011   Leukopenia 04/09/2011   Anxiety state 04/06/2007   Esophageal reflux 04/06/2007   Essential hypertension 04/06/2007   Hyperlipidemia 04/06/2007   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 No: NPRS scale: 0/10 Pain location: R hip Pain description: aching  Aggravating factors: "when I move my leg the wrong way it makes it hurt in my back"  (demonstrates marching) Relieving factors: not doing the motion that hurts   PRECAUTIONS: None  WEIGHT BEARING RESTRICTIONS: No  FALLS:  Has patient fallen in last 6 months? No  LIVING ENVIRONMENT: Lives with: lives with their  spouse Lives in: House/apartment, has basement  (usually goes down there 1x/month)  Stairs: yes , 6 steps to enter with step to pattern  Has following equipment at home: Single point cane, walker, shower grab bars   OCCUPATION: N/A Leisure: reading, computer games   PLOF: Independent with basic ADLs  PATIENT GOALS: getting her strength back,    OBJECTIVE:   DIAGNOSTIC FINDINGS: no relevant MSK imaging  PATIENT SURVEYS:  FOTO 41 52 @ DC 8 pts MCII  FOTO 7/18 47 FOTO 7/30: 42  9/18 FOTO     LOWER EXTREMITY MMT:  MMT Right 7/18 9/18 Left 7/18 9/18  Hip flexion 4/5 4+/5 4/5 4/5  Hip extension      Hip abduction 4/5  4/5   Hip adduction 4/5 4/5 4/5 4/5 p! Into back  Hip internal rotation      Hip external rotation      Knee flexion 4-/5 4/5 4-/5 4/5  Knee extension 4/5 4+/5 4/5 4/5 p! Into back  Ankle dorsiflexion      Ankle plantarflexion 4/5  4/5   Ankle inversion      Ankle eversion       (Blank rows = not tested)  FUNCTIONAL TESTS:  5 times sit to stand: 15.2s  Timed up and go (TUG): 14.3s  7/18 5XSTS 14.5s   7/18 TUG 12.9s  9/18 TUG 14.2s 9/18 5XSTS 11.9s  4 stage balance: passed NBOS, semi tandem, unable to pass tandem <9s   GAIT: Distance walked: 1ft Assistive device utilized: Single point cane Level of assistance: Complete Independence Comments: shortened step length, shuffling gait, low foot clearance, fatigued, requires seated rest break   TODAY'S TREATMENT:                                                                                                                              DATE:   11/29/22 Nustep L4x5 minutes UE and LE Sidestepping at rail with YTB at ankles x4 laps Hurdle step overs fwd x3 hurdles- reciprocal with single rail. Standing hip abduction YTB at ankles 2 x 10 bilateral  Standing hip extension YTB at ankles 2 x 10 bilateral  Lateral step up and over 6 inch 1 x 10 bilateral  Fwd step ups 6" x10ea Tandem stance 2 x  30 second holds bilateral Standing row RTB 2x15   11/27/22  Nustep L4x5 minutes UE and LE Sidestepping at rail with YTB at ankles x4 laps Standing hip abduction YTB at ankles 2 x 10 bilateral  Standing hip extension YTB at ankles 2 x 10 bilateral  Lateral step up and over 6 inch 1 x 10 bilateral  Cone taps with minimal unilateral UE support 2 x 5 bilateral Seated trunk flexion stretch with green ball 10 x 5-10 second holds Tandem stance 2 x 30 second holds bilateral Standing row RTB 1 x 15  11/25/22 Nustep L4x5 minutes UE and LE Gait- 240 ft with SPC Sidestepping at rail with YTB at ankles x2 laps Standing hip abduction YTB at knees 2 x 10 bilateral  Standing hip extension YTB at knees 2 x 10 bilateral  Hurdles fwd- step to x4hurdles- 2 laps Trialed LAQ but had pain in L low back LTR x10 Supine HS and ITB stretch L  11/20/22 Nustep L4x5 minutes UE and LE Standing hip abduction YTB at knees 2 x 10 bilateral  Standing hip extension YTB at knees 2 x 10 bilateral  Lateral step up 6 inch 1 x 10 bilateral  LAQ 1# 1 x 10 with 5 second holds Standing alternating march 2 x 10 bilateral Tandem stance 2 x 30 second holds bilateral Seated hip adduction 2 x 10 x 10 second holds  11/18/22 Nustep L3x5 minutes UE and LE Tandem stance 2 x 20 -30 second holds bilateral SLS with 3 way vectors with unilateral HHA 3 x 5 second holds bilateral  Lateral step up 6 inch 1 x 10 bilateral  Lateral stepping with perpendicular resistance 2 x 5 RTB bilateral   PATIENT EDUCATION:  Education details: anatomy, exercise progression, DOMS expectations, envelope of function, HEP, POC  Person educated: Patient Education method: Explanation, Demonstration, Tactile cues, Verbal cues, and Handouts Education comprehension: verbalized understanding, returned demonstration, verbal cues required, and tactile cues required  HOME EXERCISE PROGRAM: Access Code: [email or web address] URL:  [email or web address] Date: 08/11/2022 Prepared by: Zebedee Iba  Updated 11/18/22  ASSESSMENT:  CLINICAL IMPRESSION: Good tolerance for therex and theract interventions. She does fatigue by mid session and required a seated rest break after hurdle exercises. Utilized gait belt with CGA as needed with standing tasks. No LOB. Working on improving strength and endurance in order to meet PT goals.   OBJECTIVE IMPAIRMENTS: Abnormal gait, decreased activity tolerance, decreased balance, decreased endurance, decreased knowledge of use of DME, decreased mobility, difficulty walking, decreased ROM, decreased strength, hypomobility, impaired flexibility, improper body mechanics, postural dysfunction, and pain.    ACTIVITY LIMITATIONS: carrying, lifting, bending, standing, squatting, stairs, transfers, bed mobility, and locomotion level   PARTICIPATION LIMITATIONS: meal prep, cleaning, laundry, driving, shopping, and community activity and exercise   PERSONAL FACTORS: Age, Fitness, Time since onset of injury/illness/exacerbation, and 1-2 comorbidities:    are also affecting patient's functional outcome.    REHAB POTENTIAL: Fair     CLINICAL DECISION MAKING: Evolving/moderate complexity   EVALUATION COMPLEXITY: Moderate     GOALS:     SHORT TERM GOALS: Target date: 09/22/2022    Pt will become independent with HEP in order to demonstrate synthesis of PT education.    Goal status: met   2.  Pt will be able to perform 5XSTS in under 12s  in order to demonstrate functional improvement above the cut off score for adults.   Goal status: ongoing   3.  Pt will be able to demonstrate ability to perform gait with upright posture and AD  in order to  demonstrate functional improvement in LE function for safety with community ambulation    Goal status: met       LONG TERM GOALS: Target date:02/08/2023    Pt will be able to demonstrate TUG in under 10 sec in order to demonstrate  functional improvement in LE function, strength, balance, and mobility for safety with community ambulation.   Goal status: ongoing   Pt will decrease 5XSTS by at least 3 seconds in order to demonstrate clinically significant improvement in LE strength    Goal status: ongoing   3.  Pt will be able to demonstrate/report ability to walk >10 mins in order to demonstrate functional improvement and tolerance to exercise and community mobility.    Goal status: MET   4.  Pt will score >/= 57 on FOTO to demonstrate improvement in perceived mobility and function.    Goal status: ongoing        PLAN:   PT FREQUENCY: 1-2x/week   PT DURATION: 12 weeks   PLANNED INTERVENTIONS: Therapeutic exercises, Therapeutic activity, Neuromuscular re-education, Balance training, Gait training, Patient/Family education, Self Care, Joint mobilization, Joint manipulation, Stair training, Prosthetic training, DME instructions, Aquatic Therapy, Dry Needling, Electrical stimulation, Spinal manipulation, Spinal mobilization, Moist heat, scar mobilization, Splintting, Taping, Vasopneumatic device, Traction, Ultrasound, Ionotophoresis 4mg /ml Dexamethasone, Manual therapy, and Re-evaluation   PLAN FOR NEXT SESSION: LE exercise for transfers/transfer mechanics, consider resistance circuits, standing balance exercise, functional activity tolerance    Donnel Saxon Dotty Gonzalo, PTA 11/29/2022, 5:29 PM

## 2022-11-30 ENCOUNTER — Ambulatory Visit (HOSPITAL_COMMUNITY)
Admission: RE | Admit: 2022-11-30 | Discharge: 2022-11-30 | Disposition: A | Discharge status: Home / Self Care | Payer: Medicare HMO | Source: Ambulatory Visit | Attending: Plastic Surgery | Admitting: Plastic Surgery

## 2022-11-30 DIAGNOSIS — Z932 Ileostomy status: Secondary | ICD-10-CM | POA: Diagnosis not present

## 2022-11-30 DIAGNOSIS — L24B3 Irritant contact dermatitis related to fecal or urinary stoma or fistula: Secondary | ICD-10-CM | POA: Diagnosis not present

## 2022-11-30 NOTE — Progress Notes (Signed)
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)    LUQ - placed 10/16/2021   Hypertension    Ileostomy, has currently (HCC)    placed 09/2021   SUI (stress urinary incontinence, female)    Thyroid disease    Urinary urgency    Family History  Problem Relation Age of Onset   Colon cancer Mother 87   Lung cancer Sister    Cancer Maternal Aunt        NOS   Cancer Daughter        appendiceal   Stomach cancer Neg Hx    Rectal cancer Neg Hx    Esophageal cancer Neg Hx    Allergies  Allergen Reactions   Sporanox [Itraconazole] Rash   Current Outpatient Medications  Medication Sig Dispense Refill Last Dose   acetaminophen (TYLENOL) 325 MG tablet Take 2 tablets (650 mg total) by mouth every 6 (six) hours as needed for mild pain or fever.      amLODipine (NORVASC) 2.5 MG tablet Take 2.5 mg by mouth daily.      diphenoxylate-atropine (LOMOTIL) 2.5-0.025 MG tablet TAKE ONE TABLET BY MOUTH EVERY 12 HOURS AS NEEDED FOR LOOSE STOOLS 60 tablet 1    fluticasone (FLONASE) 50 MCG/ACT nasal spray Place 2 sprays into both nostrils 2 (two) times daily.      ibuprofen (ADVIL) 200 MG tablet Take 400 mg by mouth 2 (two) times daily as needed.      lactose free nutrition (BOOST PLUS) LIQD Take 237 mLs by mouth 3 (three) times daily with meals. (Patient taking differently: Take 237 mLs by mouth daily.)  0    loperamide (IMODIUM A-D) 2 MG tablet Take 2 tablets (4 mg total) by mouth 4 (four) times daily as needed for diarrhea or loose stools. (Patient taking differently: Take 4 mg by mouth in the morning, at noon, and at bedtime.) 60 tablet 2    loratadine  (CLARITIN) 10 MG tablet Take 10 mg by mouth 2 (two) times daily.      magnesium oxide (MAG-OX) 400 (240 Mg) MG tablet TAKE 1 TABLET BY MOUTH 2 TIMES A DAY 60 tablet 2    methocarbamol (ROBAXIN) 500 MG tablet Take 1 tablet (500 mg total) by mouth every 8 (eight) hours as needed for muscle spasms. (Patient not taking: Reported on 05/20/2022) 30 tablet 1    mirtazapine (REMERON) 7.5 MG tablet Take 1 tablet (7.5 mg total) by mouth at bedtime. 30 tablet 5    Multiple Vitamin (MULTIVITAMIN PO) Take 1 tablet by mouth daily with breakfast.      nystatin (MYCOSTATIN/NYSTOP) powder Apply 1 Application topically 3 (three) times daily. 30 g 0    olaparib ([name]) 150 MG tablet 1 tablet Orally Twice a day 60 tablet 2    ondansetron (ZOFRAN) 4 MG tablet Take 4 mg by mouth as needed. (Patient not taking: Reported on 06/09/2022)      potassium chloride (MICRO-K) 10 MEQ CR capsule TAKE 1 CAPSULE BY MOUTH 2 TIMES A DAY 60 capsule 1    thiamine (VITAMIN B1) 100 MG tablet  Take 1 tablet (100 mg total) by mouth daily. 30 tablet 5    No current facility-administered medications for this encounter.   Facility-Administered Medications Ordered in Other Encounters  Medication Dose Route Frequency Provider Last Rate Last Admin   0.9 %  sodium chloride infusion   Intravenous Once Ladene Artist, MD       dexamethasone (DECADRON) 10 mg in sodium chloride 0.9 % 50 mL IVPB  10 mg Intravenous Once Ladene Artist, MD       ROS  Review of Systems  Constitutional:  Positive for fatigue.  Gastrointestinal:        RLQ ileostomy  Musculoskeletal:  Positive for gait problem.       Uses walker or holds spouse's arm for support  Skin:  Positive for color change and rash.       Contact dermatitis to peristomal skin, os of mucus fistula points down towards 6 o'clock, keeps skin exposed to mucus and effluent  Psychiatric/Behavioral: Negative.    All other systems reviewed and are negative.  Vital signs:  BP 136/78   Pulse 93    Temp 97.6 F (36.4 C) (Oral)   Resp 18   SpO2 96%  Exam:  Physical Exam Vitals reviewed.  Constitutional:      Appearance: Normal appearance.  Abdominal:     Palpations: Abdomen is soft.     Comments: RLQ ileostomy  Skin:    General: Skin is warm and dry.     Findings: Erythema and rash present.  Neurological:     Mental Status: She is alert and oriented to person, place, and time.  Psychiatric:        Mood and Affect: Mood normal.        Behavior: Behavior normal.     Stoma type/location:  RLQ ileostomy Stomal assessment/size:  oval, pink and moist with os of mucus fistula pointing down at 6 o'clock Peristomal assessment:  partial thickness tissue loss from 3 to 9 o'clock, creasing at 3 and 9 o'clock Hernia to abdomen  Leaks occur at 9 o'clock every time Treatment options for stomal/peristomal skin: stoma powder and skin prep Output: soft green stool Ostomy pouching: 1pc. Convex with belt.  Added barrier strips to creasing at 3 and 9o'clock to create flat pouching surface.  Barrier ring around oval stoma next to promote seal and prevent leaks at 9 o'clock crease Education provided:  implemented barrier strips.  Had tried paste and this caused burning.     Impression/dx  Contact dermatitis  using stoma powder and skin prep  Discussion  Try new barrier strips to create flat pouching surface.  COntinue convex pouch and belt.  Plan  See back one week    Visit time: 45 minutes.   Maple Hudson FNP-BC

## 2022-12-01 ENCOUNTER — Ambulatory Visit (HOSPITAL_BASED_OUTPATIENT_CLINIC_OR_DEPARTMENT_OTHER): Payer: Medicare HMO | Admitting: Physical Therapy

## 2022-12-01 ENCOUNTER — Encounter (HOSPITAL_BASED_OUTPATIENT_CLINIC_OR_DEPARTMENT_OTHER): Payer: Self-pay | Admitting: Physical Therapy

## 2022-12-01 DIAGNOSIS — Z932 Ileostomy status: Secondary | ICD-10-CM | POA: Diagnosis not present

## 2022-12-01 DIAGNOSIS — R262 Difficulty in walking, not elsewhere classified: Secondary | ICD-10-CM

## 2022-12-01 DIAGNOSIS — M6281 Muscle weakness (generalized): Secondary | ICD-10-CM | POA: Diagnosis not present

## 2022-12-01 NOTE — Therapy (Signed)
OUTPATIENT PHYSICAL THERAPY LOWER EXTREMITY TREATMENT   Patient Name: Nancy Harrison MRN: [id number] DOB:[birth year], 82 y.o., female Today's Date: 12/01/2022  END OF SESSION:  PT End of Session - 12/01/22 1539     Visit Number 21    Number of Visits 28    Date for PT Re-Evaluation 02/08/23    Authorization Type [employer] MCR    Progress Note Due on Visit 25    PT Start Time 1534    PT Stop Time 1613    PT Time Calculation (min) 39 min    Equipment Utilized During Treatment Gait belt    Activity Tolerance Patient tolerated treatment well    Behavior During Therapy WFL for tasks assessed/performed                         Past Medical History:  Diagnosis Date   Arthritis    Barrett's esophagus    Path 10/2001   Breast cancer (HCC) 01/01/2011   Cancer (HCC) 1999   rt breast   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   Cataract    Family history of colon cancer    Gastrostomy in place [facility])    LUQ - placed 10/16/2021   Hypertension    Ileostomy, has currently (HCC)    placed 09/2021   SUI (stress urinary incontinence, female)    Thyroid disease    Urinary urgency    Past Surgical History:  Procedure Laterality Date   BREAST LUMPECTOMY  1999   Chemo and radiation on right breast   IR IMAGING GUIDED PORT INSERTION  11/11/2021   LAPAROSCOPY N/A 10/16/2021   Procedure: LAPAROSCOPIC  DIVERTING OSTOMY, BIOSPY,  G-TUBE PLACEMENT;  Surgeon: Karie Soda, MD;  Location: [facility];  Service: General;  Laterality: N/A;   TONSILLECTOMY  1957   TOTAL KNEE ARTHROPLASTY  1999 & 2001   Patient Active Problem List   Diagnosis Date Noted   Bilateral inguinal hernia without obstruction or gangrene 11/12/2022   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   Acute renal failure (HCC) 05/30/2022   Non-recurrent unilateral inguinal hernia without obstruction or gangrene 04/13/2022   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   Non-recurrent bilateral inguinal hernia without obstruction or  gangrene 04/06/2022   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   Genetic testing 12/03/2021   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   Nausea & vomiting 11/06/2021   Hyponatremia 11/06/2021   Family history of colon cancer 11/05/2021   ARF (acute renal failure) (HCC) 11/05/2021   Irritant contact dermatitis associated with digestive stoma    Gastrostomy tube dysfunction (HCC)    Irritant contact dermatitis associated with fecal stoma    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 10/17/2021   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) 10/17/2021   Ileostomy care (HCC) 10/17/2021   Colonic mass 10/15/2021   History of breast cancer 12/30/2011   Leukopenia 04/09/2011   Anxiety state 04/06/2007   Esophageal reflux 04/06/2007   Essential hypertension 04/06/2007   Hyperlipidemia 04/06/2007   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 No: NPRS scale: 0/10 Pain location: R hip Pain description: aching  Aggravating factors: "when I move my leg the wrong way it makes it hurt in my back"  (demonstrates marching) Relieving factors: not doing the motion that hurts   PRECAUTIONS: None  WEIGHT BEARING RESTRICTIONS: No  FALLS:  Has patient fallen in last 6 months? No  LIVING ENVIRONMENT: Lives with: lives with their  spouse Lives in: House/apartment, has basement  (usually goes down there 1x/month)  Stairs: yes , 6 steps to enter with step to pattern  Has following equipment at home: Single point cane, walker, shower grab bars   OCCUPATION: N/A Leisure: reading, computer games   PLOF: Independent with basic ADLs  PATIENT GOALS: getting her strength back,    OBJECTIVE:   DIAGNOSTIC FINDINGS: no relevant MSK imaging  PATIENT SURVEYS:  FOTO 41 52 @ DC 8 pts MCII  FOTO 7/18 47 FOTO 7/30: 42  9/18 FOTO     LOWER EXTREMITY MMT:  MMT Right 7/18 9/18 Left 7/18 9/18  Hip flexion 4/5 4+/5 4/5 4/5  Hip extension      Hip abduction 4/5  4/5   Hip adduction 4/5 4/5 4/5 4/5 p! Into back  Hip internal rotation      Hip external rotation      Knee flexion 4-/5 4/5 4-/5 4/5  Knee extension 4/5 4+/5 4/5 4/5 p! Into back  Ankle dorsiflexion      Ankle plantarflexion 4/5  4/5   Ankle inversion      Ankle eversion       (Blank rows = not tested)  FUNCTIONAL TESTS:  5 times sit to stand: 15.2s  Timed up and go (TUG): 14.3s  7/18 5XSTS 14.5s   7/18 TUG 12.9s  9/18 TUG 14.2s 9/18 5XSTS 11.9s  4 stage balance: passed NBOS, semi tandem, unable to pass tandem <9s   GAIT: Distance walked: 84ft Assistive device utilized: Single point cane Level of assistance: Complete Independence Comments: shortened step length, shuffling gait, low foot clearance, fatigued, requires seated rest break   TODAY'S TREATMENT:                                                                                                                              DATE:   12/01/22 Nustep L4x7 minutes UE and LE  Staggered stance goblet (R LE back) 4lbs 3x8 Airex standing high march 3x20 single UE support Sidestepping at rail with YTB at ankles x4 laps Fwd step ups 6" 2x10ea Tandem stance 3x10 UE reaches each side  Standing row RTB 2x15  11/29/22 Nustep L4x5 minutes UE and LE Sidestepping at rail with YTB at ankles  x4 laps Hurdle step overs fwd x3 hurdles- reciprocal with single rail. Standing hip  abduction YTB at ankles 2 x 10 bilateral  Standing hip extension YTB at ankles 2 x 10 bilateral  Lateral step up and over 6 inch 1 x 10 bilateral  Fwd step ups 6" x10ea Tandem stance 2 x 30 second holds bilateral Standing row RTB 2x15   11/27/22 Nustep L4x5 minutes UE and LE Sidestepping at rail with YTB at ankles x4 laps Standing hip abduction YTB at ankles 2 x 10 bilateral  Standing hip extension YTB at ankles 2 x 10 bilateral  Lateral step up and over 6 inch 1 x 10 bilateral  Cone taps with minimal unilateral UE support 2 x 5 bilateral Seated trunk flexion stretch with green ball 10 x 5-10 second holds Tandem stance 2 x 30 second holds bilateral Standing row RTB 1 x 15  11/25/22 Nustep L4x5 minutes UE and LE Gait- 240 ft with SPC Sidestepping at rail with YTB at ankles x2 laps Standing hip abduction YTB at knees 2 x 10 bilateral  Standing hip extension YTB at knees 2 x 10 bilateral  Hurdles fwd- step to x4hurdles- 2 laps Trialed LAQ but had pain in L low back LTR x10 Supine HS and ITB stretch L  11/20/22 Nustep L4x5 minutes UE and LE Standing hip abduction YTB at knees 2 x 10 bilateral  Standing hip extension YTB at knees 2 x 10 bilateral  Lateral step up 6 inch 1 x 10 bilateral  LAQ 1# 1 x 10 with 5 second holds Standing alternating march 2 x 10 bilateral Tandem stance 2 x 30 second holds bilateral Seated hip adduction 2 x 10 x 10 second holds  11/18/22 Nustep L3x5 minutes UE and LE Tandem stance 2 x 20 -30 second holds bilateral SLS with 3 way vectors with unilateral HHA 3 x 5 second holds bilateral  Lateral step up 6 inch 1 x 10 bilateral  Lateral stepping with perpendicular resistance 2 x 5 RTB bilateral   PATIENT EDUCATION:  Education details: anatomy, exercise progression, DOMS expectations, envelope of function, HEP, POC  Person educated: Patient Education method:  Explanation, Demonstration, Tactile cues, Verbal cues, and Handouts Education comprehension: verbalized understanding, returned demonstration, verbal cues required, and tactile cues required  HOME EXERCISE PROGRAM: Access Code: [email or web address] URL: [email or web address] Date: 08/11/2022 Prepared by: Zebedee Iba  Updated 11/18/22  ASSESSMENT:  CLINICAL IMPRESSION:  Pt able to increase endurance based exercise today with LE muscle fatigue causing need for 3 seated rest breaks during session. Pt's balance was improved and able to start UE reaching to begin shifting COG. Pt's L hip quickly fatiguing today with step up activity with 2nd set. Pt able to progress with exercise but does standing pauses. Continue with muscle endurance progression and standing tolerance as pt reports muscle weakness being greater limiting factor than SOB. Pt would benefit from continued skilled therapy in order to reach goals and maximize functional mobility for prevention of further functional decline.    OBJECTIVE IMPAIRMENTS: Abnormal gait, decreased activity tolerance, decreased balance, decreased endurance, decreased knowledge of use of DME, decreased mobility, difficulty walking, decreased ROM, decreased strength, hypomobility, impaired flexibility, improper body mechanics, postural dysfunction, and pain.    ACTIVITY LIMITATIONS: carrying, lifting, bending, standing, squatting, stairs, transfers, bed mobility, and locomotion level   PARTICIPATION LIMITATIONS: meal prep, cleaning, laundry, driving, shopping, and community activity and exercise   PERSONAL FACTORS: Age, Fitness, Time since onset of injury/illness/exacerbation, and 1-2 comorbidities:    are also affecting patient's functional outcome.    REHAB POTENTIAL: Fair  CLINICAL DECISION MAKING: Evolving/moderate complexity   EVALUATION COMPLEXITY: Moderate     GOALS:     SHORT TERM GOALS: Target date: 09/22/2022    Pt will become  independent with HEP in order to demonstrate synthesis of PT education.    Goal status: met   2.  Pt will be able to perform 5XSTS in under 12s  in order to demonstrate functional improvement above the cut off score for adults.   Goal status: ongoing   3.  Pt will be able to demonstrate ability to perform gait with upright posture and AD  in order to demonstrate functional improvement in LE function for safety with community ambulation    Goal status: met       LONG TERM GOALS: Target date:02/08/2023    Pt will be able to demonstrate TUG in under 10 sec in order to demonstrate functional improvement in LE function, strength, balance, and mobility for safety with community ambulation.   Goal status: ongoing   Pt will decrease 5XSTS by at least 3 seconds in order to demonstrate clinically significant improvement in LE strength    Goal status: ongoing   3.  Pt will be able to demonstrate/report ability to walk >10 mins in order to demonstrate functional improvement and tolerance to exercise and community mobility.    Goal status: MET   4.  Pt will score >/= 57 on FOTO to demonstrate improvement in perceived mobility and function.    Goal status: ongoing        PLAN:   PT FREQUENCY: 1-2x/week   PT DURATION: 12 weeks   PLANNED INTERVENTIONS: Therapeutic exercises, Therapeutic activity, Neuromuscular re-education, Balance training, Gait training, Patient/Family education, Self Care, Joint mobilization, Joint manipulation, Stair training, Prosthetic training, DME instructions, Aquatic Therapy, Dry Needling, Electrical stimulation, Spinal manipulation, Spinal mobilization, Moist heat, scar mobilization, Splintting, Taping, Vasopneumatic device, Traction, Ultrasound, Ionotophoresis 4mg /ml Dexamethasone, Manual therapy, and Re-evaluation   PLAN FOR NEXT SESSION: LE exercise for transfers/transfer mechanics, consider resistance circuits, standing balance exercise, functional activity  tolerance    Zebedee Iba, PT 12/01/2022, 4:15 PM

## 2022-12-06 ENCOUNTER — Encounter (HOSPITAL_BASED_OUTPATIENT_CLINIC_OR_DEPARTMENT_OTHER): Payer: Self-pay | Admitting: Physical Therapy

## 2022-12-06 ENCOUNTER — Ambulatory Visit (HOSPITAL_BASED_OUTPATIENT_CLINIC_OR_DEPARTMENT_OTHER): Payer: Medicare HMO | Admitting: Physical Therapy

## 2022-12-06 DIAGNOSIS — M6281 Muscle weakness (generalized): Secondary | ICD-10-CM

## 2022-12-06 DIAGNOSIS — R262 Difficulty in walking, not elsewhere classified: Secondary | ICD-10-CM | POA: Diagnosis not present

## 2022-12-06 NOTE — Therapy (Signed)
OUTPATIENT PHYSICAL THERAPY LOWER EXTREMITY TREATMENT   Patient Name: Nancy Harrison MRN: [id number] DOB:1940-12-02, 82 y.o., female Today's Date: 12/06/2022  END OF SESSION:  PT End of Session - 12/06/22 1325     Visit Number 22    Number of Visits 28    Date for PT Re-Evaluation 02/08/23    Authorization Type [employer] MCR    Progress Note Due on Visit 25    PT Start Time 1316    PT Stop Time 1356    PT Time Calculation (min) 40 min    Equipment Utilized During Treatment Gait belt    Activity Tolerance Patient tolerated treatment well    Behavior During Therapy WFL for tasks assessed/performed                          Past Medical History:  Diagnosis Date   Arthritis    Barrett's esophagus    Path 10/2001   Breast cancer (HCC) 01/01/2011   Cancer (HCC) 1999   rt breast   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   Cataract    Family history of colon cancer    Gastrostomy in place [facility])    LUQ - placed 10/16/2021   Hypertension    Ileostomy, has currently (HCC)    placed 09/2021   SUI (stress urinary incontinence, female)    Thyroid disease    Urinary urgency    Past Surgical History:  Procedure Laterality Date   BREAST LUMPECTOMY  1999   Chemo and radiation on right breast   IR IMAGING GUIDED PORT INSERTION  11/11/2021   LAPAROSCOPY N/A 10/16/2021   Procedure: LAPAROSCOPIC  DIVERTING OSTOMY, BIOSPY,  G-TUBE PLACEMENT;  Surgeon: Karie Soda, MD;  Location: [facility];  Service: General;  Laterality: N/A;   TONSILLECTOMY  1957   TOTAL KNEE ARTHROPLASTY  1999 & 2001   Patient Active Problem List   Diagnosis Date Noted   Bilateral inguinal hernia without obstruction or gangrene 11/12/2022   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   Acute renal failure (HCC) 05/30/2022   Non-recurrent unilateral inguinal hernia without obstruction or gangrene 04/13/2022   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   Non-recurrent bilateral inguinal hernia without obstruction  or gangrene 04/06/2022   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   Genetic testing 12/03/2021   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   Nausea & vomiting 11/06/2021   Hyponatremia 11/06/2021   Family history of colon cancer 11/05/2021   ARF (acute renal failure) (HCC) 11/05/2021   Irritant contact dermatitis associated with digestive stoma    Gastrostomy tube dysfunction (HCC)    Irritant contact dermatitis associated with fecal stoma    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 10/17/2021   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) 10/17/2021   Ileostomy present (HCC) 10/17/2021   Colonic mass 10/15/2021   History of breast cancer 12/30/2011   Leukopenia 04/09/2011   Anxiety state 04/06/2007   Esophageal reflux 04/06/2007   Essential hypertension 04/06/2007   Hyperlipidemia 04/06/2007   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 More muscle soreness than pain.   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 No: NPRS scale: 0/10 Pain location: R hip Pain description: aching  Aggravating factors: "when I move my leg the wrong way it makes it hurt in my back"  (demonstrates marching) Relieving factors: not doing the motion that hurts   PRECAUTIONS: None  WEIGHT BEARING RESTRICTIONS: No  FALLS:  Has patient fallen in last 6  months? No  LIVING ENVIRONMENT: Lives with: lives with their spouse Lives in: House/apartment, has basement  (usually goes down there 1x/month)  Stairs: yes , 6 steps to enter with step to pattern  Has following equipment at home: Single point cane, walker, shower grab bars   OCCUPATION: N/A Leisure: reading, computer games   PLOF: Independent with basic ADLs  PATIENT GOALS: getting her strength back,    OBJECTIVE:   DIAGNOSTIC FINDINGS: no relevant MSK imaging  PATIENT SURVEYS:  FOTO 41 52 @ DC 8 pts MCII  FOTO 7/18 47 FOTO 7/30: 42  9/18 FOTO     LOWER EXTREMITY MMT:  MMT Right 7/18 9/18 Left 7/18 9/18  Hip flexion 4/5 4+/5 4/5 4/5  Hip extension      Hip abduction 4/5  4/5   Hip adduction 4/5 4/5 4/5 4/5 p! Into back  Hip internal rotation      Hip external rotation      Knee flexion 4-/5 4/5 4-/5 4/5  Knee extension 4/5 4+/5 4/5 4/5 p! Into back  Ankle dorsiflexion      Ankle plantarflexion 4/5  4/5   Ankle inversion      Ankle eversion       (Blank rows = not tested)  FUNCTIONAL TESTS:  5 times sit to stand: 15.2s  Timed up and go (TUG): 14.3s  7/18 5XSTS 14.5s   7/18 TUG 12.9s  9/18 TUG 14.2s 9/18 5XSTS 11.9s  4 stage balance: passed NBOS, semi tandem, unable to pass tandem <9s   GAIT: Distance walked: 88ft Assistive device utilized: Single point cane Level of assistance: Complete Independence Comments: shortened step length, shuffling gait, low foot clearance, fatigued, requires seated rest break   TODAY'S TREATMENT:                                                                                                                              DATE:   10/14 Nustep L5x8 minutes UE and LE  YTB resisted STS  3x8 Airex standing high march 3x20 single UE support Sidestepping at rail with YTB at ankles x2 laps Retro stepping  at rail with YTB at ankles x2 laps YTB at knees mini squat at rail 3x8  Tandem stance 3x10 UE reaches each side   Standing row RTB 2x15   12/01/22 Nustep L4x7  minutes UE and LE  Staggered stance goblet (R LE back) 4lbs 3x8 Airex standing high march 3x20 single UE support Sidestepping at rail with YTB at ankles x4 laps Fwd step ups 6" 2x10ea Tandem stance 3x10 UE reaches each side  Standing row RTB 2x15  11/29/22 Nustep L4x5 minutes UE and LE Sidestepping at rail with YTB at ankles x4 laps Hurdle step overs fwd x3 hurdles- reciprocal with single rail. Standing hip abduction YTB at ankles 2 x 10 bilateral  Standing hip extension YTB at ankles 2 x 10 bilateral  Lateral step up and over 6 inch 1 x 10 bilateral  Fwd step ups 6" x10ea Tandem stance 2 x 30 second holds bilateral Standing row RTB 2x15   11/27/22 Nustep L4x5 minutes UE and LE Sidestepping at rail with YTB at ankles x4 laps Standing hip abduction YTB at ankles 2 x 10 bilateral  Standing hip extension YTB at ankles 2 x 10 bilateral  Lateral step up and over 6 inch 1 x 10 bilateral  Cone taps with minimal unilateral UE support 2 x 5 bilateral Seated trunk flexion stretch with green ball 10 x 5-10 second holds Tandem stance 2 x 30 second holds bilateral Standing row RTB 1 x 15  11/25/22 Nustep L4x5 minutes UE and LE Gait- 240 ft with SPC Sidestepping at rail with YTB at ankles x2 laps Standing hip abduction YTB at knees 2 x 10 bilateral  Standing hip extension YTB at knees 2 x 10 bilateral  Hurdles fwd- step to x4hurdles- 2 laps Trialed LAQ but had pain in L low back LTR x10 Supine HS and ITB stretch L  11/20/22 Nustep L4x5 minutes UE and LE Standing hip abduction YTB at knees 2 x 10 bilateral  Standing hip extension YTB at knees 2 x 10 bilateral  Lateral step up 6 inch 1 x 10 bilateral  LAQ 1# 1 x 10 with 5 second holds Standing alternating march 2 x 10 bilateral Tandem stance 2 x 30 second holds bilateral Seated hip adduction 2 x 10 x 10 second holds  11/18/22 Nustep L3x5 minutes UE and LE Tandem stance 2 x 20 -30  second holds bilateral SLS with 3 way vectors with unilateral HHA 3 x 5 second holds bilateral  Lateral step up 6 inch 1 x 10 bilateral  Lateral stepping with perpendicular resistance 2 x 5 RTB bilateral   PATIENT EDUCATION:  Education details: anatomy, exercise progression, DOMS expectations, envelope of function, HEP, POC  Person educated: Patient Education method: Explanation, Demonstration, Tactile cues, Verbal cues, and Handouts Education comprehension: verbalized understanding, returned demonstration, verbal cues required, and tactile cues required  HOME EXERCISE PROGRAM: Access Code: [email or web address] URL: [email or web address] Date: 08/11/2022 Prepared by: Zebedee Iba  Updated 11/18/22  ASSESSMENT:  CLINICAL IMPRESSION: Pt with improved standing tolerance for exercise today with only 2 seated rest breaks needed today. Volume of LE and standing exercise increased today with good success. Eccentric strength emphasized with resisted STS. However, pt requires VC for forceful hip extension. VC given for max effort intent during exercise to aid in muscle recruitment and hip extension force. Continue with standing tolerance and balance as able. HEP updated today with standing hip strength. Pt would benefit from continued skilled therapy in order to reach goals and maximize functional mobility for prevention of further functional decline.    OBJECTIVE IMPAIRMENTS: Abnormal gait, decreased activity tolerance, decreased balance, decreased endurance, decreased knowledge of use of DME, decreased mobility, difficulty walking, decreased ROM, decreased strength, hypomobility,  impaired flexibility, improper body mechanics, postural dysfunction, and pain.    ACTIVITY LIMITATIONS: carrying, lifting, bending, standing, squatting, stairs, transfers, bed mobility, and locomotion level   PARTICIPATION LIMITATIONS: meal prep, cleaning, laundry, driving, shopping, and community activity and  exercise   PERSONAL FACTORS: Age, Fitness, Time since onset of injury/illness/exacerbation, and 1-2 comorbidities:    are also affecting patient's functional outcome.    REHAB POTENTIAL: Fair     CLINICAL DECISION MAKING: Evolving/moderate complexity   EVALUATION COMPLEXITY: Moderate     GOALS:     SHORT TERM GOALS: Target date: 09/22/2022    Pt will become independent with HEP in order to demonstrate synthesis of PT education.    Goal status: met   2.  Pt will be able to perform 5XSTS in under 12s  in order to demonstrate functional improvement above the cut off score for adults.   Goal status: ongoing   3.  Pt will be able to demonstrate ability to perform gait with upright posture and AD  in order to demonstrate functional improvement in LE function for safety with community ambulation    Goal status: met       LONG TERM GOALS: Target date:02/08/2023    Pt will be able to demonstrate TUG in under 10 sec in order to demonstrate functional improvement in LE function, strength, balance, and mobility for safety with community ambulation.   Goal status: ongoing   Pt will decrease 5XSTS by at least 3 seconds in order to demonstrate clinically significant improvement in LE strength    Goal status: ongoing   3.  Pt will be able to demonstrate/report ability to walk >10 mins in order to demonstrate functional improvement and tolerance to exercise and community mobility.    Goal status: MET   4.  Pt will score >/= 57 on FOTO to demonstrate improvement in perceived mobility and function.    Goal status: ongoing        PLAN:   PT FREQUENCY: 1-2x/week   PT DURATION: 12 weeks   PLANNED INTERVENTIONS: Therapeutic exercises, Therapeutic activity, Neuromuscular re-education, Balance training, Gait training, Patient/Family education, Self Care, Joint mobilization, Joint manipulation, Stair training, Prosthetic training, DME instructions, Aquatic Therapy, Dry Needling,  Electrical stimulation, Spinal manipulation, Spinal mobilization, Moist heat, scar mobilization, Splintting, Taping, Vasopneumatic device, Traction, Ultrasound, Ionotophoresis 4mg /ml Dexamethasone, Manual therapy, and Re-evaluation   PLAN FOR NEXT SESSION: LE exercise for transfers/transfer mechanics, consider resistance circuits, standing balance exercise, functional activity tolerance    Zebedee Iba, PT 12/06/2022, 1:59 PM

## 2022-12-08 ENCOUNTER — Other Ambulatory Visit: Payer: Self-pay

## 2022-12-08 ENCOUNTER — Encounter (HOSPITAL_BASED_OUTPATIENT_CLINIC_OR_DEPARTMENT_OTHER): Payer: Self-pay | Admitting: Physical Therapy

## 2022-12-08 ENCOUNTER — Ambulatory Visit (HOSPITAL_BASED_OUTPATIENT_CLINIC_OR_DEPARTMENT_OTHER): Payer: Medicare HMO | Admitting: Physical Therapy

## 2022-12-08 DIAGNOSIS — M6281 Muscle weakness (generalized): Secondary | ICD-10-CM | POA: Diagnosis not present

## 2022-12-08 DIAGNOSIS — R262 Difficulty in walking, not elsewhere classified: Secondary | ICD-10-CM | POA: Diagnosis not present

## 2022-12-08 NOTE — Therapy (Signed)
OUTPATIENT PHYSICAL THERAPY LOWER EXTREMITY TREATMENT   Patient Name: Nancy Harrison MRN: [id number] DOB:Feb 14, 1941, 82 y.o., female Today's Date: 12/08/2022  END OF SESSION:  PT End of Session - 12/08/22 1440     Visit Number 23    Number of Visits 28    Date for PT Re-Evaluation 02/08/23    Authorization Type [employer] MCR    Progress Note Due on Visit 25    PT Start Time 1446    PT Stop Time 1524    PT Time Calculation (min) 38 min    Equipment Utilized During Treatment Gait belt    Activity Tolerance Patient tolerated treatment well    Behavior During Therapy WFL for tasks assessed/performed                          Past Medical History:  Diagnosis Date   Arthritis    Barrett's esophagus    Path 10/2001   Breast cancer (HCC) 01/01/2011   Cancer (HCC) 1999   rt breast   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   Cataract    Family history of colon cancer    Gastrostomy in place [facility])    LUQ - placed 10/16/2021   Hypertension    Ileostomy, has currently (HCC)    placed 09/2021   SUI (stress urinary incontinence, female)    Thyroid disease    Urinary urgency    Past Surgical History:  Procedure Laterality Date   BREAST LUMPECTOMY  1999   Chemo and radiation on right breast   IR IMAGING GUIDED PORT INSERTION  11/11/2021   LAPAROSCOPY N/A 10/16/2021   Procedure: LAPAROSCOPIC  DIVERTING OSTOMY, BIOSPY,  G-TUBE PLACEMENT;  Surgeon: Karie Soda, MD;  Location: [facility];  Service: General;  Laterality: N/A;   TONSILLECTOMY  1957   TOTAL KNEE ARTHROPLASTY  1999 & 2001   Patient Active Problem List   Diagnosis Date Noted   Bilateral inguinal hernia without obstruction or gangrene 11/12/2022   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   Acute renal failure (HCC) 05/30/2022   Non-recurrent unilateral inguinal hernia without obstruction or gangrene 04/13/2022   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   Non-recurrent bilateral inguinal hernia without obstruction  or gangrene 04/06/2022   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   Genetic testing 12/03/2021   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   Nausea & vomiting 11/06/2021   Hyponatremia 11/06/2021   Family history of colon cancer 11/05/2021   ARF (acute renal failure) (HCC) 11/05/2021   Irritant contact dermatitis associated with digestive stoma    Gastrostomy tube dysfunction (HCC)    Irritant contact dermatitis associated with fecal stoma    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 10/17/2021   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) 10/17/2021   Ileostomy present (HCC) 10/17/2021   Colonic mass 10/15/2021   History of breast cancer 12/30/2011   Leukopenia 04/09/2011   Anxiety state 04/06/2007   Esophageal reflux 04/06/2007   Essential hypertension 04/06/2007   Hyperlipidemia 04/06/2007   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 No pain or  increase in soreness from the last session. Pt states the hips are stiff today and her hands hurt from the cold.   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 No: NPRS scale: 0/10 Pain location: R hip Pain description: aching  Aggravating factors: "when I move my leg the wrong way it makes it hurt in my back"  (demonstrates marching) Relieving factors: not doing the motion that hurts   PRECAUTIONS: None  WEIGHT  BEARING RESTRICTIONS: No  FALLS:  Has patient fallen in last 6 months? No  LIVING ENVIRONMENT: Lives with: lives with their spouse Lives in: House/apartment, has basement  (usually goes down there 1x/month)  Stairs: yes , 6 steps to enter with step to pattern  Has following equipment at home: Single point cane, walker, shower grab bars   OCCUPATION: N/A Leisure: reading, computer games   PLOF: Independent with basic ADLs  PATIENT GOALS: getting her strength back,    OBJECTIVE:   DIAGNOSTIC FINDINGS: no relevant MSK imaging  PATIENT SURVEYS:  FOTO 41 52 @ DC 8 pts MCII  FOTO 7/18 47 FOTO 7/30: 42  9/18 FOTO     LOWER EXTREMITY MMT:  MMT Right 7/18 9/18 Left 7/18 9/18  Hip flexion 4/5 4+/5 4/5 4/5  Hip extension      Hip abduction 4/5  4/5   Hip adduction 4/5 4/5 4/5 4/5 p! Into back  Hip internal rotation      Hip external rotation      Knee flexion 4-/5 4/5 4-/5 4/5  Knee extension 4/5 4+/5 4/5 4/5 p! Into back  Ankle dorsiflexion      Ankle plantarflexion 4/5  4/5   Ankle inversion      Ankle eversion       (Blank rows = not tested)  FUNCTIONAL TESTS:  5 times sit to stand: 15.2s  Timed up and go (TUG): 14.3s  7/18 5XSTS 14.5s   7/18 TUG 12.9s  9/18 TUG 14.2s 9/18 5XSTS 11.9s  4 stage balance: passed NBOS, semi tandem, unable to pass tandem <9s   GAIT: Distance walked: 26ft Assistive device utilized: Single point cane Level of assistance: Complete Independence Comments: shortened step length, shuffling gait, low foot clearance, fatigued, requires seated rest break   TODAY'S TREATMENT:                                                                                                                              DATE:    10/16 Nustep L5x8 minutes UE and LE  Hooklying SKTC 30s 3x each  DL bridge 4U98 LTR 1X91 Supine LE ext without touching table 3x10 STS 3x8  YTB resisted STS  3x8 Airex standing high march 3x20 single UE  support Sidestepping at rail with YTB at ankles x2 laps Retro stepping  at rail with YTB at ankles x2 laps YTB at knees mini squat at rail 3x8  Tandem stance 3x10 UE reaches each side  Standing row RTB 2x15  10/14 Nustep L5x8 minutes UE and LE  YTB resisted STS  3x8 Airex standing high march 3x20 single UE support Sidestepping at rail with YTB at ankles x2 laps Retro stepping  at rail with YTB at ankles x2 laps YTB at knees mini squat at rail 3x8  Tandem stance 3x10 UE reaches each side  Standing row RTB 2x15   12/01/22 Nustep L4x7 minutes UE and LE  Staggered stance goblet (R LE back) 4lbs 3x8 Airex standing high march 3x20 single UE support Sidestepping at rail with YTB at ankles x4 laps Fwd step ups 6" 2x10ea Tandem stance 3x10 UE reaches each side  Standing row RTB 2x15  11/29/22 Nustep L4x5 minutes UE and LE Sidestepping at rail with YTB at ankles x4 laps Hurdle step overs fwd x3 hurdles- reciprocal with single rail. Standing hip abduction YTB at ankles 2 x 10 bilateral  Standing hip extension YTB at ankles 2 x 10 bilateral  Lateral step up and over 6 inch 1 x 10 bilateral  Fwd step ups 6" x10ea Tandem stance 2 x 30 second holds bilateral Standing row RTB 2x15   11/27/22 Nustep L4x5 minutes UE and LE Sidestepping at rail with YTB at ankles x4 laps Standing hip abduction YTB at ankles 2 x 10 bilateral  Standing hip extension YTB at ankles 2 x 10 bilateral  Lateral step up and over 6 inch 1 x 10 bilateral  Cone taps with minimal unilateral UE support 2 x 5 bilateral Seated trunk flexion stretch with green ball 10 x 5-10 second holds Tandem stance 2 x 30 second holds bilateral Standing row RTB 1 x 15  11/25/22 Nustep L4x5 minutes UE and LE Gait- 240 ft with SPC Sidestepping at rail with YTB at ankles x2 laps Standing hip abduction YTB at knees 2 x 10 bilateral  Standing hip extension YTB at knees 2 x 10 bilateral  Hurdles fwd- step to x4hurdles- 2 laps Trialed  LAQ but had pain in L low back LTR x10 Supine HS and ITB stretch L  11/20/22 Nustep L4x5 minutes UE and LE Standing hip abduction YTB at knees 2 x 10 bilateral  Standing hip extension YTB at knees 2 x 10 bilateral  Lateral step up 6 inch 1 x 10 bilateral  LAQ 1# 1 x 10 with 5 second holds Standing alternating march 2 x 10 bilateral Tandem stance 2 x 30 second holds bilateral Seated hip adduction 2 x 10 x 10 second holds  11/18/22 Nustep L3x5 minutes UE and LE Tandem stance 2 x 20 -30 second holds bilateral SLS with 3 way vectors with unilateral HHA 3 x 5 second holds bilateral  Lateral step up 6 inch 1 x 10 bilateral  Lateral stepping with perpendicular resistance 2 x 5 RTB bilateral   PATIENT EDUCATION:  Education details: anatomy, exercise progression, DOMS expectations, envelope of function, HEP, POC  Person educated: Patient Education method: Explanation, Demonstration, Tactile cues, Verbal cues, and Handouts Education comprehension: verbalized understanding, returned demonstration, verbal cues required, and tactile cues required  HOME EXERCISE PROGRAM: Access Code: [email or web address] URL: [email or web address] Date: 08/11/2022 Prepared by: Zebedee Iba  Updated 11/18/22  ASSESSMENT:  CLINICAL IMPRESSION: Patient session today was limited by fatigue even with supine and table level exercise.  Patient does have report of stiffness into the hips today that was  improved with static stretching.  Patient found particular difficulty with antigravity hip extension in hook lying bridge position.  Patient quickly fatigued with long lever hip flexor strengthening so much so that she was not able to perform weighted sit to stands like previous in program. Patient does report she feels more fatigue in general today with no particular cause.  Plan to revisit and continue with functional standing muscle endurance exercise at next session.  Pt would benefit from continued skilled therapy  in order to reach goals and maximize functional mobility for prevention of further functional decline.    OBJECTIVE IMPAIRMENTS: Abnormal gait, decreased activity tolerance, decreased balance, decreased endurance, decreased knowledge of use of DME, decreased mobility, difficulty walking, decreased ROM, decreased strength, hypomobility, impaired flexibility, improper body mechanics, postural dysfunction, and pain.    ACTIVITY LIMITATIONS: carrying, lifting, bending, standing, squatting, stairs, transfers, bed mobility, and locomotion level   PARTICIPATION LIMITATIONS: meal prep, cleaning, laundry, driving, shopping, and community activity and exercise   PERSONAL FACTORS: Age, Fitness, Time since onset of injury/illness/exacerbation, and 1-2 comorbidities:    are also affecting patient's functional outcome.    REHAB POTENTIAL: Fair     CLINICAL DECISION MAKING: Evolving/moderate complexity   EVALUATION COMPLEXITY: Moderate     GOALS:     SHORT TERM GOALS: Target date: 09/22/2022    Pt will become independent with HEP in order to demonstrate synthesis of PT education.    Goal status: met   2.  Pt will be able to perform 5XSTS in under 12s  in order to demonstrate functional improvement above the cut off score for adults.   Goal status: ongoing   3.  Pt will be able to demonstrate ability to perform gait with upright posture and AD  in order to demonstrate functional improvement in LE function for safety with community ambulation    Goal status: met       LONG TERM GOALS: Target date:02/08/2023    Pt will be able to demonstrate TUG in under 10 sec in order to demonstrate functional improvement in LE function, strength, balance, and mobility for safety with community ambulation.   Goal status: ongoing   Pt will decrease 5XSTS by at least 3 seconds in order to demonstrate clinically significant improvement in LE strength    Goal status: ongoing   3.  Pt will be able to  demonstrate/report ability to walk >10 mins in order to demonstrate functional improvement and tolerance to exercise and community mobility.    Goal status: MET   4.  Pt will score >/= 57 on FOTO to demonstrate improvement in perceived mobility and function.    Goal status: ongoing        PLAN:   PT FREQUENCY: 1-2x/week   PT DURATION: 12 weeks   PLANNED INTERVENTIONS: Therapeutic exercises, Therapeutic activity, Neuromuscular re-education, Balance training, Gait training, Patient/Family education, Self Care, Joint mobilization, Joint manipulation, Stair training, Prosthetic training, DME instructions, Aquatic Therapy, Dry Needling, Electrical stimulation, Spinal manipulation, Spinal mobilization, Moist heat, scar mobilization, Splintting, Taping, Vasopneumatic device, Traction, Ultrasound, Ionotophoresis 4mg /ml Dexamethasone, Manual therapy, and Re-evaluation   PLAN FOR NEXT SESSION: LE exercise for transfers/transfer mechanics, consider resistance circuits, standing balance exercise, functional activity tolerance    Zebedee Iba, PT 12/08/2022, 3:26 PM

## 2022-12-10 ENCOUNTER — Other Ambulatory Visit: Payer: Self-pay | Admitting: Oncology

## 2022-12-13 ENCOUNTER — Ambulatory Visit (HOSPITAL_BASED_OUTPATIENT_CLINIC_OR_DEPARTMENT_OTHER): Payer: Medicare HMO | Admitting: Physical Therapy

## 2022-12-14 ENCOUNTER — Other Ambulatory Visit: Payer: Self-pay | Admitting: Nurse Practitioner

## 2022-12-14 DIAGNOSIS — C481 Malignant neoplasm of specified parts of peritoneum: Secondary | ICD-10-CM

## 2022-12-15 ENCOUNTER — Ambulatory Visit (HOSPITAL_BASED_OUTPATIENT_CLINIC_OR_DEPARTMENT_OTHER): Payer: Medicare HMO | Admitting: Physical Therapy

## 2022-12-15 ENCOUNTER — Encounter (HOSPITAL_BASED_OUTPATIENT_CLINIC_OR_DEPARTMENT_OTHER): Payer: Self-pay | Admitting: Physical Therapy

## 2022-12-15 ENCOUNTER — Other Ambulatory Visit: Payer: Self-pay | Admitting: Oncology

## 2022-12-15 DIAGNOSIS — M6281 Muscle weakness (generalized): Secondary | ICD-10-CM

## 2022-12-15 DIAGNOSIS — R262 Difficulty in walking, not elsewhere classified: Secondary | ICD-10-CM | POA: Diagnosis not present

## 2022-12-15 DIAGNOSIS — Z932 Ileostomy status: Secondary | ICD-10-CM | POA: Diagnosis not present

## 2022-12-15 NOTE — Therapy (Signed)
OUTPATIENT PHYSICAL THERAPY LOWER EXTREMITY TREATMENT   Patient Name: Nancy Harrison MRN: [id number] DOB:1940-06-12, 82 y.o., female Today's Date: 12/15/2022  END OF SESSION:  PT End of Session - 12/15/22 1434     Visit Number 24    Number of Visits 28    Date for PT Re-Evaluation 02/08/23    Authorization Type [employer] MCR    Progress Note Due on Visit 25    PT Start Time 1433    PT Stop Time 1512    PT Time Calculation (min) 39 min    Equipment Utilized During Treatment Gait belt    Activity Tolerance Patient tolerated treatment well    Behavior During Therapy WFL for tasks assessed/performed                          Past Medical History:  Diagnosis Date   Arthritis    Barrett's esophagus    Path 10/2001   Breast cancer (HCC) 01/01/2011   Cancer (HCC) 1999   rt breast   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   Cataract    Family history of colon cancer    Gastrostomy in place [facility])    LUQ - placed 10/16/2021   Hypertension    Ileostomy, has currently (HCC)    placed 09/2021   SUI (stress urinary incontinence, female)    Thyroid disease    Urinary urgency    Past Surgical History:  Procedure Laterality Date   BREAST LUMPECTOMY  1999   Chemo and radiation on right breast   IR IMAGING GUIDED PORT INSERTION  11/11/2021   LAPAROSCOPY N/A 10/16/2021   Procedure: LAPAROSCOPIC  DIVERTING OSTOMY, BIOSPY,  G-TUBE PLACEMENT;  Surgeon: Karie Soda, MD;  Location: [facility];  Service: General;  Laterality: N/A;   TONSILLECTOMY  1957   TOTAL KNEE ARTHROPLASTY  1999 & 2001   Patient Active Problem List   Diagnosis Date Noted   Bilateral inguinal hernia without obstruction or gangrene 11/12/2022   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   Acute renal failure (HCC) 05/30/2022   Non-recurrent unilateral inguinal hernia without obstruction or gangrene 04/13/2022   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   Non-recurrent bilateral inguinal hernia without obstruction  or gangrene 04/06/2022   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   Genetic testing 12/03/2021   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   Nausea & vomiting 11/06/2021   Hyponatremia 11/06/2021   Family history of colon cancer 11/05/2021   ARF (acute renal failure) (HCC) 11/05/2021   Irritant contact dermatitis associated with digestive stoma    Gastrostomy tube dysfunction (HCC)    Irritant contact dermatitis associated with fecal stoma    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 10/17/2021   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) 10/17/2021   Ileostomy present (HCC) 10/17/2021   Colonic mass 10/15/2021   History of breast cancer 12/30/2011   Leukopenia 04/09/2011   Anxiety state 04/06/2007   Esophageal reflux 04/06/2007   Essential hypertension 04/06/2007   Hyperlipidemia 04/06/2007   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 Overall feels she is getting better.  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 No: NPRS scale: 0/10 Pain location: R hip Pain description: aching  Aggravating factors: "when I move my leg the wrong way it makes it hurt in my back"  (demonstrates marching) Relieving factors: not doing the motion that hurts   PRECAUTIONS: None  WEIGHT BEARING RESTRICTIONS: No  FALLS:  Has patient fallen in last 6 months?  No  LIVING ENVIRONMENT: Lives with: lives with their spouse Lives in: House/apartment, has basement  (usually goes down there 1x/month)  Stairs: yes , 6 steps to enter with step to pattern  Has following equipment at home: Single point cane, walker, shower grab bars   OCCUPATION: N/A Leisure: reading, computer games   PLOF: Independent with basic ADLs  PATIENT GOALS: getting her strength back,    OBJECTIVE:   DIAGNOSTIC FINDINGS: no relevant MSK imaging  PATIENT SURVEYS:  FOTO 41 52 @ DC 8 pts MCII  FOTO 7/18 47 FOTO 7/30: 42  9/18 FOTO     LOWER EXTREMITY MMT:  MMT Right 7/18 9/18 Left 7/18 9/18  Hip flexion 4/5 4+/5 4/5 4/5  Hip extension      Hip abduction 4/5  4/5   Hip adduction 4/5 4/5 4/5 4/5 p! Into back  Hip internal rotation      Hip external rotation      Knee flexion 4-/5 4/5 4-/5 4/5  Knee extension 4/5 4+/5 4/5 4/5 p! Into back  Ankle dorsiflexion      Ankle plantarflexion 4/5  4/5   Ankle inversion      Ankle eversion       (Blank rows = not tested)  FUNCTIONAL TESTS:  5 times sit to stand: 15.2s  Timed up and go (TUG): 14.3s  7/18 5XSTS 14.5s   7/18 TUG 12.9s  9/18 TUG 14.2s 9/18 5XSTS 11.9s  4 stage balance: passed NBOS, semi tandem, unable to pass tandem <9s   GAIT: Distance walked: 35ft Assistive device utilized: Single point cane Level of assistance: Complete Independence Comments: shortened step length, shuffling gait, low foot clearance, fatigued, requires seated rest break   TODAY'S TREATMENT:                                                                                                                              DATE:  12/15/22 Nustep L5x5 minutes UE and LE Airex standing high march 3x20 single UE support Step up with contralateral knee drive 1 x 10 bilateral YTB at knees mini squat at rail 3x8  YTB resisted STS  3x8 Sidestepping at rail with YTB at ankles 6x 8 feet Retro stepping  at rail with YTB at ankles 6  x 8 feet Standing shoulder extension 1 x 10 RTB  10/16 Nustep L5x8 minutes UE and LE  Hooklying SKTC 30s 3x each  DL bridge 4U98 LTR 1X91 Supine LE ext without touching table 3x10 STS 3x8  YTB resisted STS  3x8 Airex standing high march 3x20 single UE support Sidestepping at rail with YTB at ankles x2 laps Retro stepping  at rail with YTB at ankles x2 laps YTB at knees mini squat at rail 3x8  Tandem stance 3x10 UE reaches each side  Standing row RTB 2x15  10/14 Nustep L5x8 minutes UE and LE  YTB resisted STS  3x8 Airex standing high march 3x20 single UE support Sidestepping at rail with YTB at ankles x2 laps Retro stepping  at rail with YTB at ankles x2 laps YTB at knees mini squat at rail 3x8  Tandem stance 3x10 UE reaches each side  Standing row RTB 2x15   12/01/22 Nustep L4x7 minutes UE and LE  Staggered stance goblet (R LE back) 4lbs 3x8 Airex standing high march 3x20 single UE support Sidestepping at rail with YTB at ankles x4 laps Fwd step ups 6" 2x10ea Tandem stance 3x10 UE reaches each side  Standing row RTB 2x15  11/29/22 Nustep L4x5 minutes UE and LE Sidestepping at rail with YTB at ankles x4 laps Hurdle step overs fwd x3 hurdles- reciprocal with single rail. Standing hip abduction YTB at ankles 2 x 10 bilateral  Standing hip extension YTB at ankles 2 x 10 bilateral  Lateral step up and over 6 inch 1 x 10 bilateral  Fwd step ups 6" x10ea Tandem stance 2 x 30 second holds bilateral Standing row RTB 2x15   11/27/22 Nustep L4x5 minutes UE and LE Sidestepping at rail with YTB at ankles x4 laps Standing hip abduction YTB at ankles 2 x 10 bilateral  Standing hip extension YTB at ankles 2 x 10 bilateral  Lateral step up and over 6 inch 1 x 10 bilateral  Cone taps with minimal unilateral UE support 2 x 5 bilateral Seated trunk flexion stretch with green ball 10 x 5-10 second holds Tandem stance 2 x 30 second holds bilateral Standing row RTB 1 x  15  11/25/22 Nustep L4x5 minutes UE and LE Gait- 240 ft with SPC Sidestepping at rail with YTB at ankles x2 laps Standing hip abduction YTB at knees 2 x 10 bilateral  Standing hip extension YTB at knees 2 x 10 bilateral  Hurdles fwd- step to x4hurdles- 2 laps Trialed LAQ but had pain in L low back LTR x10 Supine HS and ITB stretch L  11/20/22 Nustep L4x5 minutes UE and LE Standing hip abduction YTB at knees 2 x 10 bilateral  Standing hip extension YTB at knees 2 x 10 bilateral  Lateral step up 6 inch 1 x 10 bilateral  LAQ 1# 1 x 10 with 5 second holds Standing alternating march 2 x 10 bilateral Tandem stance 2 x 30 second holds bilateral Seated hip adduction 2 x 10 x 10 second holds  11/18/22 Nustep L3x5 minutes UE and LE Tandem stance 2 x 20 -30 second holds bilateral SLS with 3 way vectors with unilateral HHA 3 x 5 second holds bilateral  Lateral step up 6 inch 1 x 10 bilateral  Lateral stepping with perpendicular resistance 2 x 5 RTB bilateral   PATIENT EDUCATION:  Education details: anatomy, exercise progression, DOMS expectations, envelope of function, HEP, POC  Person educated: Patient Education method: Explanation, Demonstration, Tactile cues, Verbal cues, and Handouts Education comprehension: verbalized understanding, returned demonstration, verbal cues required,  and tactile cues required  HOME EXERCISE PROGRAM: Access Code: [email or web address] URL: [email or web address] Date: 08/11/2022 Prepared by: Zebedee Iba  Updated 11/18/22  ASSESSMENT:  CLINICAL IMPRESSION: Continued with functional strengthening and balance related exercises which are performed with good mechanics. She requires intermittent short rest breaks for fatigue. CGA provided with standing exercises for balance/safety without loss of balance. . Patient will continue to benefit from physical therapy in order to improve function and reduce impairment.     OBJECTIVE IMPAIRMENTS: Abnormal gait,  decreased activity tolerance, decreased balance, decreased endurance, decreased knowledge of use of DME, decreased mobility, difficulty walking, decreased ROM, decreased strength, hypomobility, impaired flexibility, improper body mechanics, postural dysfunction, and pain.    ACTIVITY LIMITATIONS: carrying, lifting, bending, standing, squatting, stairs, transfers, bed mobility, and locomotion level   PARTICIPATION LIMITATIONS: meal prep, cleaning, laundry, driving, shopping, and community activity and exercise   PERSONAL FACTORS: Age, Fitness, Time since onset of injury/illness/exacerbation, and 1-2 comorbidities:    are also affecting patient's functional outcome.    REHAB POTENTIAL: Fair     CLINICAL DECISION MAKING: Evolving/moderate complexity   EVALUATION COMPLEXITY: Moderate     GOALS:     SHORT TERM GOALS: Target date: 09/22/2022    Pt will become independent with HEP in order to demonstrate synthesis of PT education.    Goal status: met   2.  Pt will be able to perform 5XSTS in under 12s  in order to demonstrate functional improvement above the cut off score for adults.   Goal status: ongoing   3.  Pt will be able to demonstrate ability to perform gait with upright posture and AD  in order to demonstrate functional improvement in LE function for safety with community ambulation    Goal status: met       LONG TERM GOALS: Target date:02/08/2023    Pt will be able to demonstrate TUG in under 10 sec in order to demonstrate functional improvement in LE function, strength, balance, and mobility for safety with community ambulation.   Goal status: ongoing   Pt will decrease 5XSTS by at least 3 seconds in order to demonstrate clinically significant improvement in LE strength    Goal status: ongoing   3.  Pt will be able to demonstrate/report ability to walk >10 mins in order to demonstrate functional improvement and tolerance to exercise and community mobility.    Goal  status: MET   4.  Pt will score >/= 57 on FOTO to demonstrate improvement in perceived mobility and function.    Goal status: ongoing        PLAN:   PT FREQUENCY: 1-2x/week   PT DURATION: 12 weeks   PLANNED INTERVENTIONS: Therapeutic exercises, Therapeutic activity, Neuromuscular re-education, Balance training, Gait training, Patient/Family education, Self Care, Joint mobilization, Joint manipulation, Stair training, Prosthetic training, DME instructions, Aquatic Therapy, Dry Needling, Electrical stimulation, Spinal manipulation, Spinal mobilization, Moist heat, scar mobilization, Splintting, Taping, Vasopneumatic device, Traction, Ultrasound, Ionotophoresis 4mg /ml Dexamethasone, Manual therapy, and Re-evaluation   PLAN FOR NEXT SESSION: LE exercise for transfers/transfer mechanics, consider resistance circuits, standing balance exercise, functional activity tolerance    Wyman Songster, PT 12/15/2022, 2:34 PM

## 2022-12-17 ENCOUNTER — Encounter: Payer: Self-pay | Admitting: Nurse Practitioner

## 2022-12-17 ENCOUNTER — Inpatient Hospital Stay: Payer: Medicare HMO

## 2022-12-17 ENCOUNTER — Inpatient Hospital Stay: Payer: Medicare HMO | Admitting: Nurse Practitioner

## 2022-12-17 VITALS — BP 133/87 | HR 88 | Temp 97.5°F | Resp 18 | Wt 119.6 lb

## 2022-12-17 VITALS — BP 129/58 | HR 86 | Resp 18

## 2022-12-17 DIAGNOSIS — C481 Malignant neoplasm of specified parts of peritoneum: Secondary | ICD-10-CM

## 2022-12-17 DIAGNOSIS — Z5112 Encounter for antineoplastic immunotherapy: Secondary | ICD-10-CM | POA: Diagnosis not present

## 2022-12-17 DIAGNOSIS — Z23 Encounter for immunization: Secondary | ICD-10-CM | POA: Diagnosis not present

## 2022-12-17 LAB — CMP (CANCER CENTER ONLY)
ALT: 14 U/L (ref 0–44)
AST: 23 U/L (ref 15–41)
Albumin: 4.2 g/dL (ref 3.5–5.0)
Alkaline Phosphatase: 44 U/L (ref 38–126)
Anion gap: 7 (ref 5–15)
BUN: 23 mg/dL (ref 8–23)
CO2: 29 mmol/L (ref 22–32)
Calcium: 9.7 mg/dL (ref 8.9–10.3)
Chloride: 103 mmol/L (ref 98–111)
Creatinine: 0.78 mg/dL (ref 0.44–1.00)
GFR, Estimated: >60 mL/min (ref >60)
Glucose, Bld: 121 mg/dL — ABNORMAL HIGH (ref 70–99)
Potassium: 4 mmol/L (ref 3.5–5.1)
Sodium: 139 mmol/L (ref 135–145)
Total Bilirubin: 0.9 mg/dL (ref 0.3–1.2)
Total Protein: 7.7 g/dL (ref 6.5–8.1)

## 2022-12-17 LAB — CBC WITH DIFFERENTIAL (CANCER CENTER ONLY)
Abs Immature Granulocytes: 0.01 10*3/uL (ref 0.00–0.07)
Basophils Absolute: 0.1 10*3/uL (ref 0.0–0.1)
Basophils Relative: 1 %
Eosinophils Absolute: 0.2 10*3/uL (ref 0.0–0.5)
Eosinophils Relative: 4 %
HCT: 40.4 % (ref 36.0–46.0)
Hemoglobin: 13.4 g/dL (ref 12.0–15.0)
Immature Granulocytes: 0 %
Lymphocytes Relative: 24 %
Lymphs Abs: 1.2 10*3/uL (ref 0.7–4.0)
MCH: 34.8 pg — ABNORMAL HIGH (ref 26.0–34.0)
MCHC: 33.2 g/dL (ref 30.0–36.0)
MCV: 104.9 fL — ABNORMAL HIGH (ref 80.0–100.0)
Monocytes Absolute: 0.5 10*3/uL (ref 0.1–1.0)
Monocytes Relative: 10 %
Neutro Abs: 3 10*3/uL (ref 1.7–7.7)
Neutrophils Relative %: 61 %
Platelet Count: 218 10*3/uL (ref 150–400)
RBC: 3.85 MIL/uL — ABNORMAL LOW (ref 3.87–5.11)
RDW: 14.6 % (ref 11.5–15.5)
WBC Count: 4.9 10*3/uL (ref 4.0–10.5)
nRBC: 0 % (ref 0.0–0.2)

## 2022-12-17 LAB — TOTAL PROTEIN, URINE DIPSTICK

## 2022-12-17 MED ORDER — HEPARIN SOD (PORK) LOCK FLUSH 100 UNIT/ML IV SOLN
500.0000 [IU] | Freq: Once | INTRAVENOUS | Status: AC | PRN
  Administered 2022-12-17: 500 [IU]

## 2022-12-17 MED ORDER — SODIUM CHLORIDE 0.9 % IV SOLN
15.0000 mg/kg | Freq: Once | INTRAVENOUS | Status: AC
  Administered 2022-12-17: 800 mg via INTRAVENOUS
  Filled 2022-12-17: qty 16

## 2022-12-17 MED ORDER — SODIUM CHLORIDE 0.9 % IV SOLN: Freq: Once | INTRAVENOUS | Status: AC

## 2022-12-17 MED ORDER — SODIUM CHLORIDE 0.9% FLUSH
10.0000 mL | INTRAVENOUS | Status: DC | PRN
  Administered 2022-12-17: 10 mL

## 2022-12-17 NOTE — Progress Notes (Signed)
Patient seen by Lonna Cobb NP today  Vitals are within treatment parameters:Yes   Labs are within treatment parameters: No (Please specify and give further instructions.) urine has trace of protein. Provider will continue to monitor.   Treatment plan has been signed: Yes   Per physician team, Patient is ready for treatment and there are NO modifications to the treatment plan.

## 2022-12-17 NOTE — Patient Instructions (Signed)
[facility]   Discharge Instructions: Thank you for choosing [facility] to provide your oncology and hematology care.   If you have a lab appointment with the Cancer Center, please go directly to the Cancer Center and check in at the registration area.   Wear comfortable clothing and clothing appropriate for easy access to any Portacath or PICC line.   We strive to give you quality time with your provider. You may need to reschedule your appointment if you arrive late (15 or more minutes).  Arriving late affects you and other patients whose appointments are after yours.  Also, if you miss three or more appointments without notifying the office, you may be dismissed from the clinic at the provider's discretion.      For prescription refill requests, have your pharmacy contact our office and allow 72 hours for refills to be completed.    Today you received the following chemotherapy and/or immunotherapy agents Bevacizumab-bvzr (ZIRABEV).      To help prevent nausea and vomiting after your treatment, we encourage you to take your nausea medication as directed.  BELOW ARE SYMPTOMS THAT SHOULD BE REPORTED IMMEDIATELY: *FEVER GREATER THAN 100.4 F (38 C) OR HIGHER *CHILLS OR SWEATING *NAUSEA AND VOMITING THAT IS NOT CONTROLLED WITH YOUR NAUSEA MEDICATION *UNUSUAL SHORTNESS OF BREATH *UNUSUAL BRUISING OR BLEEDING *URINARY PROBLEMS (pain or burning when urinating, or frequent urination) *BOWEL PROBLEMS (unusual diarrhea, constipation, pain near the anus) TENDERNESS IN MOUTH AND THROAT WITH OR WITHOUT PRESENCE OF ULCERS (sore throat, sores in mouth, or a toothache) UNUSUAL RASH, SWELLING OR PAIN  UNUSUAL VAGINAL DISCHARGE OR ITCHING   Items with * indicate a potential emergency and should be followed up as soon as possible or go to the Emergency Department if any problems should occur.  Please show the CHEMOTHERAPY ALERT CARD or IMMUNOTHERAPY  ALERT CARD at check-in to the Emergency Department and triage nurse.  Should you have questions after your visit or need to cancel or reschedule your appointment, please contact [facility]  Dept: [phone]  and follow the prompts.  Office hours are 8:00 a.m. to 4:30 p.m. Monday - Friday. Please note that voicemails left after 4:00 p.m. may not be returned until the following business day.  We are closed weekends and major holidays. You have access to a nurse at all times for urgent questions. Please call the main number to the clinic Dept: [phone] and follow the prompts.   For any non-urgent questions, you may also contact your provider using MyChart. We now offer e-Visits for anyone 23 and older to request care online for non-urgent symptoms. For details visit [email or web address].   Also download the MyChart app! Go to the app store, search "MyChart", open the app, select , and log in with your MyChart username and password.  Bevacizumab Injection What is this medication? BEVACIZUMAB (be va SIZ yoo mab) treats some types of cancer. It works by blocking a protein that causes cancer cells to grow and multiply. This helps to slow or stop the spread of cancer cells. It is a monoclonal antibody. This medicine may be used for other purposes; ask your health care provider or pharmacist if you have questions. COMMON BRAND NAME(S): Alymsys, Avastin, MVASI, Omer Jack What should I tell my care team before I take this medication? They need to know if you have any of these conditions: Blood clots Coughing up blood Having or recent surgery Heart  failure High blood pressure History of a connection between 2 or more body parts that do not usually connect (fistula) History of a tear in your stomach or intestines Protein in your urine An unusual or allergic reaction to bevacizumab, other medications, foods, dyes, or preservatives Pregnant or trying to  get pregnant Breast-feeding How should I use this medication? This medication is injected into a vein. It is given by your care team in a hospital or clinic setting. Talk to your care team the use of this medication in children. Special care may be needed. Overdosage: If you think you have taken too much of this medicine contact a poison control center or emergency room at once. NOTE: This medicine is only for you. Do not share this medicine with others. What if I miss a dose? Keep appointments for follow-up doses. It is important not to miss your dose. Call your care team if you are unable to keep an appointment. What may interact with this medication? Interactions are not expected. This list may not describe all possible interactions. Give your health care provider a list of all the medicines, herbs, non-prescription drugs, or dietary supplements you use. Also tell them if you smoke, drink alcohol, or use illegal drugs. Some items may interact with your medicine. What should I watch for while using this medication? Your condition will be monitored carefully while you are receiving this medication. You may need blood work while taking this medication. This medication may make you feel generally unwell. This is not uncommon as chemotherapy can affect healthy cells as well as cancer cells. Report any side effects. Continue your course of treatment even though you feel ill unless your care team tells you to stop. This medication may increase your risk to bruise or bleed. Call your care team if you notice any unusual bleeding. Before having surgery, talk to your care team to make sure it is ok. This medication can increase the risk of poor healing of your surgical site or wound. You will need to stop this medication for 28 days before surgery. After surgery, wait at least 28 days before restarting this medication. Make sure the surgical site or wound is healed enough before restarting this medication.  Talk to your care team if questions. Talk to your care team if you may be pregnant. Serious birth defects can occur if you take this medication during pregnancy and for 6 months after the last dose. Contraception is recommended while taking this medication and for 6 months after the last dose. Your care team can help you find the option that works for you. Do not breastfeed while taking this medication and for 6 months after the last dose. This medication can cause infertility. Talk to your care team if you are concerned about your fertility. What side effects may I notice from receiving this medication? Side effects that you should report to your care team as soon as possible: Allergic reactions--skin rash, itching, hives, swelling of the face, lips, tongue, or throat Bleeding--bloody or black, tar-like stools, vomiting blood or brown material that looks like coffee grounds, red or dark brown urine, small red or purple spots on skin, unusual bruising or bleeding Blood clot--pain, swelling, or warmth in the leg, shortness of breath, chest pain Heart attack--pain or tightness in the chest, shoulders, arms, or jaw, nausea, shortness of breath, cold or clammy skin, feeling faint or lightheaded Heart failure--shortness of breath, swelling of the ankles, feet, or hands, sudden weight gain, unusual weakness or  fatigue Increase in blood pressure Infection--fever, chills, cough, sore throat, wounds that don't heal, pain or trouble when passing urine, general feeling of discomfort or being unwell Infusion reactions--chest pain, shortness of breath or trouble breathing, feeling faint or lightheaded Kidney injury--decrease in the amount of urine, swelling of the ankles, hands, or feet Stomach pain that is severe, does not go away, or gets worse Stroke--sudden numbness or weakness of the face, arm, or leg, trouble speaking, confusion, trouble walking, loss of balance or coordination, dizziness, severe headache,  change in vision Sudden and severe headache, confusion, change in vision, seizures, which may be signs of posterior reversible encephalopathy syndrome (PRES) Side effects that usually do not require medical attention (report to your care team if they continue or are bothersome): Back pain Change in taste Diarrhea Dry skin Increased tears Nosebleed This list may not describe all possible side effects. Call your doctor for medical advice about side effects. You may report side effects to FDA at 1-800-FDA-1088. Where should I keep my medication? This medication is given in a hospital or clinic. It will not be stored at home. NOTE: This sheet is a summary. It may not cover all possible information. If you have questions about this medicine, talk to your doctor, pharmacist, or health care provider.  2024 Elsevier/Gold Standard (2021-06-26 00:00:00)

## 2022-12-17 NOTE — Progress Notes (Signed)
[facility] OFFICE PROGRESS NOTE   Diagnosis: Peritoneal carcinoma  INTERVAL HISTORY:   Nancy Harrison returns as scheduled.  She completed another cycle of bevacizumab 11/26/2022.  She continues olaparib.  She denies nausea/vomiting.  No mouth sores.  Stool consistency varies.  She feels this is based on her diet.  She has an intermittent rash in the groin.  Appetite varies.  Weight is stable.  Objective:  Vital signs in last 24 hours:  Blood pressure 133/87, pulse 88, temperature (!) 97.5 F (36.4 C), temperature source Oral, resp. rate 18, weight 119 lb 9.6 oz (54.3 kg), SpO2 97%.    HEENT: No thrush or ulcers. Resp: Lungs clear bilaterally. Cardio: Regular rate and rhythm. GI: Right abdomen ileostomy with soft semiformed stool in the collection bag.  No hepatosplenomegaly. Vascular: No leg edema. Skin: Faint area of erythema right groin. Port-A-Cath without erythema.  Lab Results:  Lab Results  Component Value Date   WBC 4.9 12/17/2022   HGB 13.4 12/17/2022   HCT 40.4 12/17/2022   MCV 104.9 (H) 12/17/2022   PLT 218 12/17/2022   NEUTROABS 3.0 12/17/2022    Imaging:  No results found.  Medications: I have reviewed the patient's current medications.  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  Bone lesions concerning for metastases, right lung nodule Laparoscopy, diverting loop ileostomy, gastrostomy tube placement, peritoneal biopsy 10/16/2021, no evidence of a primary tumor site at the appendix, ovaries, or uterus.  Diffuse peritoneal carcinomatosis Pathology of the lesser curvature of stomach carcinomatosis biopsy-metastatic poorly differentiated carcinoma, CK7,  PAX8, WT1, and p53 positive with focal labeling for p16.  CDX2, CK20, and GATA3 negative.  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 Foundation 1-MSS, tumor mutation burden 5, HRD positive-LOH score 34.1% 08/11/2021 CA125 818 CT chest 11/03/2021-10 mm right lower lobe nodule, scattered tiny pulmonary nodules, peritoneal carcinomatosis Cycle 1 Taxol/carboplatin 11/12/2021 Cycle 2 Taxol/carboplatin 12/03/2021 Cycle 3 Taxol/carboplatin 12/24/2021 Bone scan 01/05/2022-negative for metastatic disease Cycle 4 Taxol/carboplatin 01/13/2022 CTs 01/22/2022-reduction in peritoneal carcinomatosis, no evidence of progressive disease stable bilateral lower lobe pulmonary nodules Cycle 5 Taxol/carboplatin 02/03/2022 Cycle 6 Taxol/carboplatin 02/24/2022 CT abdomen/pelvis 04/19/2022-slight decrease in peritoneal carcinomatosis with residual lesions 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   Abdominal distention/pain and diarrhea secondary to #1 3.   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  Lumpectomy and adjuvant CMF x8 followed by radiation 4. Hypertension 5. Hypothyroidism 6. Barrett's esophagus 7. Family history of multiple cancers including appendix, colon, and lung cancer 8.  Hospital admission 11/05/2021 with dehydration/prerenal azotemia secondary to nausea and high output ileostomy 9.  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 Admission 05/29/2022 with dehydration and acute renal injury      Disposition: Ms. Trace  appears stable.  She continues to tolerate bevacizumab and olaparib well.  There is no clinical evidence of disease progression.  We will follow-up on the CA125 from today.  Plan to proceed with Avastin  today as scheduled.  Restaging CTs in the next 4 to 6 weeks.  CBC and chemistry panel reviewed.  Labs adequate to proceed as above.  Urine with trace protein.  Continue to monitor.  She will return for follow-up and treatment in 3 weeks.  We are available to see her sooner if needed.    Lonna Cobb ANP/GNP-BC   12/17/2022  10:03 AM

## 2022-12-18 ENCOUNTER — Other Ambulatory Visit: Payer: Self-pay

## 2022-12-20 ENCOUNTER — Encounter (HOSPITAL_BASED_OUTPATIENT_CLINIC_OR_DEPARTMENT_OTHER): Payer: Self-pay | Admitting: Physical Therapy

## 2022-12-20 ENCOUNTER — Ambulatory Visit (HOSPITAL_BASED_OUTPATIENT_CLINIC_OR_DEPARTMENT_OTHER): Payer: Medicare HMO | Admitting: Physical Therapy

## 2022-12-20 DIAGNOSIS — M6281 Muscle weakness (generalized): Secondary | ICD-10-CM

## 2022-12-20 DIAGNOSIS — R262 Difficulty in walking, not elsewhere classified: Secondary | ICD-10-CM

## 2022-12-20 NOTE — Therapy (Signed)
OUTPATIENT PHYSICAL THERAPY LOWER EXTREMITY TREATMENT  Progress Note Reporting Period 11/10/2022 to 12/20/2022  See note below for Objective Data and Assessment of Progress/Goals.       Patient Name: Nancy Harrison MRN: [id number] DOB:1941-02-21, 82 y.o., female Today's Date: 12/20/2022  END OF SESSION:  PT End of Session - 12/20/22 1404     Visit Number 25    Number of Visits 32    Date for PT Re-Evaluation 02/08/23    Authorization Type [employer] MCR    Progress Note Due on Visit 25    PT Start Time 1403    PT Stop Time 1443    PT Time Calculation (min) 40 min    Equipment Utilized During Treatment Gait belt    Activity Tolerance Patient tolerated treatment well    Behavior During Therapy WFL for tasks assessed/performed                          Past Medical History:  Diagnosis Date   Arthritis    Barrett's esophagus    Path 10/2001   Breast cancer (HCC) 01/01/2011   Cancer (HCC) 1999   rt breast   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)    LUQ - placed 10/16/2021   Hypertension    Ileostomy, has currently (HCC)    placed 09/2021   SUI (stress urinary incontinence, female)    Thyroid disease    Urinary urgency    Past Surgical History:  Procedure Laterality Date   BREAST LUMPECTOMY  1999   Chemo and radiation on right breast   IR IMAGING GUIDED PORT INSERTION  11/11/2021   LAPAROSCOPY N/A 10/16/2021   Procedure: LAPAROSCOPIC  DIVERTING OSTOMY, BIOSPY,  G-TUBE PLACEMENT;  Surgeon: Karie Soda, MD;  Location: [facility];  Service: General;  Laterality: N/A;   TONSILLECTOMY  1957   TOTAL KNEE ARTHROPLASTY  1999 & 2001   Patient Active Problem List   Diagnosis Date Noted   Bilateral inguinal hernia without obstruction or gangrene 11/12/2022   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   Acute renal failure (HCC) 05/30/2022   Non-recurrent unilateral inguinal hernia without  obstruction or gangrene 04/13/2022   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   Non-recurrent bilateral inguinal hernia without obstruction or gangrene 04/06/2022   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   Genetic testing 12/03/2021   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   Nausea & vomiting 11/06/2021   Hyponatremia 11/06/2021   Family history of colon cancer 11/05/2021   ARF (acute renal failure) (HCC) 11/05/2021   Irritant contact dermatitis associated with digestive stoma    Gastrostomy tube dysfunction (HCC)    Irritant contact dermatitis associated with fecal stoma    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 10/17/2021   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) 10/17/2021   Ileostomy present (HCC) 10/17/2021   Colonic mass 10/15/2021   History of breast cancer 12/30/2011   Leukopenia 04/09/2011   Anxiety state 04/06/2007   Esophageal reflux 04/06/2007   Essential hypertension 04/06/2007   Hyperlipidemia 04/06/2007   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 Pt states that there is some small improvement but still feels she is limited by endurance and balance the most.   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 Yes: NPRS scale: 2.5/10 Pain location: L hip Pain description: aching  Aggravating factors: "when I move my leg the wrong way it makes it hurt in my back"  (demonstrates marching) Relieving factors: not doing the motion that hurts   PRECAUTIONS: None  WEIGHT BEARING RESTRICTIONS: No  FALLS:  Has patient fallen in last 6 months? No  LIVING ENVIRONMENT: Lives with: lives with their spouse Lives in: House/apartment, has basement  (usually goes down there 1x/month)  Stairs: yes , 6 steps to enter with step to pattern  Has following equipment at home: Single point cane, walker, shower grab bars   OCCUPATION: N/A Leisure: reading, computer games   PLOF: Independent with basic ADLs  PATIENT GOALS: getting her strength back,    OBJECTIVE:   DIAGNOSTIC FINDINGS: no relevant MSK imaging  PATIENT SURVEYS:  FOTO 41 52 @ DC 8 pts MCII  FOTO 7/18 47 FOTO 7/30: 42  9/18  FOTO 47 10/2 FOTO  44      LOWER EXTREMITY MMT:  MMT Right 7/18 9/18 Left 7/18 9/18  Hip flexion 4/5 4+/5 4/5 4/5  Hip extension      Hip abduction 4/5  4/5   Hip adduction 4/5 4/5 4/5 4/5 p! Into back  Hip internal rotation      Hip external rotation      Knee flexion 4-/5 4/5 4-/5 4/5  Knee extension 4/5 4+/5 4/5 4/5 p! Into back  Ankle dorsiflexion      Ankle plantarflexion 4/5  4/5   Ankle inversion      Ankle eversion       (Blank rows = not tested)  FUNCTIONAL TESTS:  5 times sit to stand: 15.2s  Timed up and go (TUG): 14.3s  7/18 5XSTS 14.5s   7/18 TUG 12.9s  9/18 TUG 14.2s 9/18 5XSTS 11.9s  10/28 5XSTS 12.1s (less momentum used with transfer motion) 10/28  TUG 16.2s  4 stage balance: passed NBOS, semi tandem, unable to pass tandem <9s     GAIT: Distance walked: 83ft Assistive device utilized: Single point cane Level of assistance: Complete Independence Comments: shortened step length, shuffling gait, improved step height/foot clearance, less audible dragging of  feet   TODAY'S TREATMENT:  DATE:  10/28  Nustep L5x8 minutes UE and LE  Edu of testing results  Standing box toe taps with UE support 2x20 Fwd step up 2x10 each side  Airex standing high march 3x20 single UE support Minisquat 4x6   12/15/22 Nustep L5x5 minutes UE and LE Airex standing high march 3x20 single UE support Step up with contralateral knee drive 1 x 10 bilateral YTB at knees mini squat at rail 3x8  YTB resisted STS  3x8 Sidestepping at rail with YTB at ankles 6x 8 feet Retro stepping  at rail with YTB at ankles 6 x 8 feet Standing shoulder extension 1 x 10 RTB   10/16 Nustep L5x8 minutes UE and LE  Hooklying SKTC 30s 3x each  DL bridge 4U98 LTR 1X91 Supine LE ext without touching table 3x10 STS 3x8  YTB resisted STS  3x8 Airex standing high march 3x20 single UE support Sidestepping at rail with YTB at ankles x2 laps Retro stepping  at rail with YTB at ankles x2 laps YTB at knees mini squat at rail 3x8  Tandem stance 3x10 UE reaches each side  Standing row RTB 2x15  10/14 Nustep L5x8 minutes UE and LE  YTB resisted STS  3x8 Airex standing high march 3x20 single UE support Sidestepping at rail with YTB at ankles x2 laps Retro stepping  at rail with YTB at ankles x2 laps YTB at knees mini squat at rail 3x8  Tandem stance 3x10 UE reaches each side  Standing row RTB 2x15   12/01/22 Nustep L4x7 minutes UE and LE  Staggered stance goblet (R LE back) 4lbs 3x8 Airex standing high march 3x20 single UE support Sidestepping at rail with YTB at ankles x4 laps Fwd step ups 6" 2x10ea Tandem stance 3x10 UE reaches each side  Standing row RTB 2x15  11/29/22 Nustep L4x5 minutes UE and LE Sidestepping at rail with YTB at ankles x4 laps Hurdle step overs fwd x3 hurdles- reciprocal with single rail. Standing hip abduction YTB at ankles 2  x 10 bilateral  Standing hip extension YTB at ankles 2 x 10 bilateral  Lateral step up and over 6 inch 1 x 10 bilateral  Fwd step ups 6" x10ea Tandem stance 2 x 30 second holds bilateral Standing row RTB 2x15   11/27/22 Nustep L4x5 minutes UE and LE Sidestepping at rail with YTB at ankles x4 laps Standing hip abduction YTB at ankles 2 x 10 bilateral  Standing hip extension YTB at ankles 2 x 10 bilateral  Lateral step up and over 6 inch 1 x 10 bilateral  Cone taps with minimal unilateral UE support 2 x 5 bilateral Seated trunk flexion stretch with green ball 10 x 5-10 second holds Tandem stance 2 x 30 second holds bilateral Standing row RTB 1 x 15  11/25/22 Nustep L4x5 minutes UE and LE Gait- 240 ft with SPC Sidestepping at rail with YTB at ankles x2 laps Standing hip abduction YTB at knees 2 x 10 bilateral  Standing hip extension YTB at knees 2 x 10 bilateral  Hurdles fwd- step to x4hurdles- 2 laps Trialed LAQ but had pain in L low back LTR x10 Supine HS and ITB stretch L  11/20/22 Nustep L4x5 minutes UE and LE Standing hip abduction YTB at knees 2 x 10 bilateral  Standing hip extension YTB at knees 2 x 10 bilateral  Lateral step up 6 inch 1 x 10 bilateral  LAQ 1# 1 x 10 with 5 second holds Standing alternating march  2 x 10 bilateral Tandem stance 2 x 30 second holds bilateral Seated hip adduction 2 x 10 x 10 second holds  11/18/22 Nustep L3x5 minutes UE and LE Tandem stance 2 x 20 -30 second holds bilateral SLS with 3 way vectors with unilateral HHA 3 x 5 second holds bilateral  Lateral step up 6 inch 1 x 10 bilateral  Lateral stepping with perpendicular resistance 2 x 5 RTB bilateral   PATIENT EDUCATION:  Education details: anatomy, exercise progression, DOMS expectations, envelope of function, HEP, POC  Person educated: Patient Education method: Explanation, Demonstration, Tactile cues, Verbal cues, and Handouts Education comprehension: verbalized understanding,  returned demonstration, verbal cues required, and tactile cues required  HOME EXERCISE PROGRAM: Access Code: [email or web address] URL: [email or web address] Date: 08/11/2022 Prepared by: Zebedee Iba  Updated 11/18/22  ASSESSMENT:  CLINICAL IMPRESSION: Pt with relatively similar testing scores from previous progress note. Pt fatigue and balance continue to be limiting factors though qualitatively, pts gait and transfers have improved greatly since last progress note. Pt's history of hip pain does appear to be limiting some PT progress. Plan for pt to extend HEP by 4 more visits in order to establish final HEP focused on endurance and balance. Patient will continue to benefit from physical therapy in order to improve function and reduce impairment.     OBJECTIVE IMPAIRMENTS: Abnormal gait, decreased activity tolerance, decreased balance, decreased endurance, decreased knowledge of use of DME, decreased mobility, difficulty walking, decreased ROM, decreased strength, hypomobility, impaired flexibility, improper body mechanics, postural dysfunction, and pain.    ACTIVITY LIMITATIONS: carrying, lifting, bending, standing, squatting, stairs, transfers, bed mobility, and locomotion level   PARTICIPATION LIMITATIONS: meal prep, cleaning, laundry, driving, shopping, and community activity and exercise   PERSONAL FACTORS: Age, Fitness, Time since onset of injury/illness/exacerbation, and 1-2 comorbidities:    are also affecting patient's functional outcome.    REHAB POTENTIAL: Fair     CLINICAL DECISION MAKING: Evolving/moderate complexity   EVALUATION COMPLEXITY: Moderate     GOALS:     SHORT TERM GOALS: Target date: 09/22/2022    Pt will become independent with HEP in order to demonstrate synthesis of PT education.    Goal status: met   2.  Pt will be able to perform 5XSTS in under 12s  in order to demonstrate functional improvement above the cut off score for adults.   Goal status:  ongoing   3.  Pt will be able to demonstrate ability to perform gait with upright posture and AD  in order to demonstrate functional improvement in LE function for safety with community ambulation    Goal status: met       LONG TERM GOALS: Target date:02/08/2023    Pt will be able to demonstrate TUG in under 10 sec in order to demonstrate functional improvement in LE function, strength, balance, and mobility for safety with community ambulation.   Goal status: ongoing   Pt will decrease 5XSTS by at least 3 seconds in order to demonstrate clinically significant improvement in LE strength    Goal status: ongoing   3.  Pt will be able to demonstrate/report ability to walk >10 mins in order to demonstrate functional improvement and tolerance to exercise and community mobility.    Goal status: MET   4.  Pt will score >/= 57 on FOTO to demonstrate improvement in perceived mobility and function.    Goal status: ongoing        PLAN:   PT FREQUENCY: 1-2x/week   PT  DURATION: 6 weeks   PLANNED INTERVENTIONS: Therapeutic exercises, Therapeutic activity, Neuromuscular re-education, Balance training, Gait training, Patient/Family education, Self Care, Joint mobilization, Joint manipulation, Stair training, Prosthetic training, DME instructions, Aquatic Therapy, Dry Needling, Electrical stimulation, Spinal manipulation, Spinal mobilization, Moist heat, scar mobilization, Splintting, Taping, Vasopneumatic device, Traction, Ultrasound, Ionotophoresis 4mg /ml Dexamethasone, Manual therapy, and Re-evaluation   PLAN FOR NEXT SESSION: LE exercise for transfers/transfer mechanics, consider resistance circuits, standing balance exercise, functional activity tolerance    Zebedee Iba, PT 12/20/2022, 2:44 PM

## 2022-12-22 ENCOUNTER — Encounter (HOSPITAL_BASED_OUTPATIENT_CLINIC_OR_DEPARTMENT_OTHER): Payer: Self-pay | Admitting: Physical Therapy

## 2022-12-22 ENCOUNTER — Ambulatory Visit (HOSPITAL_BASED_OUTPATIENT_CLINIC_OR_DEPARTMENT_OTHER): Payer: Medicare HMO | Admitting: Physical Therapy

## 2022-12-22 DIAGNOSIS — M6281 Muscle weakness (generalized): Secondary | ICD-10-CM

## 2022-12-22 DIAGNOSIS — R262 Difficulty in walking, not elsewhere classified: Secondary | ICD-10-CM

## 2022-12-22 NOTE — Therapy (Signed)
OUTPATIENT PHYSICAL THERAPY LOWER EXTREMITY TREATMENT       Patient Name: Nancy Harrison MRN: [id number] DOB:29-Jun-1940, 82 y.o., female Today's Date: 12/22/2022  END OF SESSION:  PT End of Session - 12/22/22 1343     Visit Number 26    Number of Visits 32    Date for PT Re-Evaluation 02/08/23    Authorization Type [employer] MCR    Progress Note Due on Visit 25    PT Start Time 1343    PT Stop Time 1422    PT Time Calculation (min) 39 min    Equipment Utilized During Treatment Gait belt    Activity Tolerance Patient tolerated treatment well    Behavior During Therapy WFL for tasks assessed/performed                          Past Medical History:  Diagnosis Date   Arthritis    Barrett's esophagus    Path 10/2001   Breast cancer (HCC) 01/01/2011   Cancer (HCC) 1999   rt breast   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   Cataract    Family history of colon cancer    Gastrostomy in place [facility])    LUQ - placed 10/16/2021   Hypertension    Ileostomy, has currently (HCC)    placed 09/2021   SUI (stress urinary incontinence, female)    Thyroid disease    Urinary urgency    Past Surgical History:  Procedure Laterality Date   BREAST LUMPECTOMY  1999   Chemo and radiation on right breast   IR IMAGING GUIDED PORT INSERTION  11/11/2021   LAPAROSCOPY N/A 10/16/2021   Procedure: LAPAROSCOPIC  DIVERTING OSTOMY, BIOSPY,  G-TUBE PLACEMENT;  Surgeon: Karie Soda, MD;  Location: [facility];  Service: General;  Laterality: N/A;   TONSILLECTOMY  1957   TOTAL KNEE ARTHROPLASTY  1999 & 2001   Patient Active Problem List   Diagnosis Date Noted   Bilateral inguinal hernia without obstruction or gangrene 11/12/2022   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   Acute renal failure (HCC) 05/30/2022   Non-recurrent unilateral inguinal hernia without obstruction or gangrene 04/13/2022   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   Non-recurrent bilateral inguinal hernia without  obstruction or gangrene 04/06/2022   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   Genetic testing 12/03/2021   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   Nausea & vomiting 11/06/2021   Hyponatremia 11/06/2021   Family history of colon cancer 11/05/2021   ARF (acute renal failure) (HCC) 11/05/2021   Irritant contact dermatitis associated with digestive stoma    Gastrostomy tube dysfunction (HCC)    Irritant contact dermatitis associated with fecal stoma    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 10/17/2021   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) 10/17/2021   Ileostomy present (HCC) 10/17/2021   Colonic mass 10/15/2021   History of breast cancer 12/30/2011   Leukopenia 04/09/2011   Anxiety state 04/06/2007   Esophageal reflux 04/06/2007   Essential hypertension 04/06/2007   Hyperlipidemia 04/06/2007   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 Pt states that there is some small improvement but still feels she is limited by endurance and balance the most.   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 Yes: NPRS scale: 2.5/10 Pain location: L hip Pain description: aching  Aggravating factors: "when I move my leg the wrong way it makes it hurt in my back"  (demonstrates marching) Relieving  factors: not doing the motion that hurts   PRECAUTIONS: None  WEIGHT BEARING RESTRICTIONS: No  FALLS:  Has patient fallen in last 6 months? No  LIVING ENVIRONMENT: Lives with: lives with their spouse Lives in: House/apartment, has basement  (usually goes down there 1x/month)  Stairs: yes , 6 steps to enter with step to pattern  Has following equipment at home: Single point cane, walker, shower grab bars   OCCUPATION: N/A Leisure: reading, computer games   PLOF: Independent with basic ADLs  PATIENT GOALS: getting her strength back,    OBJECTIVE:   DIAGNOSTIC FINDINGS: no relevant MSK imaging  PATIENT SURVEYS:  FOTO 41 52 @ DC 8 pts MCII  FOTO 7/18 47 FOTO 7/30: 42  9/18  FOTO 47 10/2 FOTO  44      LOWER EXTREMITY MMT:  MMT Right 7/18 9/18 Left 7/18 9/18  Hip flexion 4/5 4+/5 4/5 4/5  Hip extension      Hip abduction 4/5  4/5   Hip adduction 4/5 4/5 4/5 4/5 p! Into back  Hip internal rotation      Hip external rotation      Knee flexion 4-/5 4/5 4-/5 4/5  Knee extension 4/5 4+/5 4/5 4/5 p! Into back  Ankle dorsiflexion      Ankle plantarflexion 4/5  4/5   Ankle inversion      Ankle eversion       (Blank rows = not tested)  FUNCTIONAL TESTS:  5 times sit to stand: 15.2s  Timed up and go (TUG): 14.3s  7/18 5XSTS 14.5s   7/18 TUG 12.9s  9/18 TUG 14.2s 9/18 5XSTS 11.9s  10/28 5XSTS 12.1s (less momentum used with transfer motion) 10/28  TUG 16.2s  4 stage balance: passed NBOS, semi tandem, unable to pass tandem <9s     GAIT: Distance walked: 74ft Assistive device utilized: Single point cane Level of assistance: Complete Independence Comments: shortened step length, shuffling gait, improved step height/foot clearance, less audible dragging of feet   TODAY'S TREATMENT:                                                                                                                              DATE:  12/22/22 Nustep L5x8 minutes UE and  LE Mini squat at rail 3 x 8 Airex standing high march 3x20  single UE support Standing row RTB 2 x 15 Standing shoulder extension RTB 2 x 15  10/28  Nustep L5x8 minutes UE and LE  Edu of testing results  Standing box toe taps with UE support 2x20 Fwd step up 2x10 each side  Airex standing high march 3x20 single UE support Minisquat 4x6   12/15/22 Nustep L5x5 minutes UE and LE Airex standing high march 3x20 single UE support Step up with contralateral knee drive 1 x 10 bilateral YTB at knees mini squat at rail 3x8  YTB resisted STS  3x8 Sidestepping at rail with YTB at ankles 6x 8 feet Retro stepping  at rail with YTB at ankles 6 x 8 feet Standing shoulder extension 1 x 10 RTB   10/16 Nustep L5x8 minutes UE and LE  Hooklying SKTC 30s 3x each  DL bridge 1O10 LTR 9U04 Supine LE ext without touching table 3x10 STS 3x8  YTB resisted STS  3x8 Airex standing high march 3x20 single UE support Sidestepping at rail with YTB at ankles x2 laps Retro stepping  at rail with YTB at ankles x2 laps YTB at knees mini squat at rail 3x8  Tandem stance 3x10 UE reaches each side  Standing row RTB 2x15  10/14 Nustep L5x8 minutes UE and LE  YTB resisted STS  3x8 Airex standing high march 3x20 single UE support Sidestepping at rail with YTB at ankles x2 laps Retro stepping  at rail with YTB at ankles x2 laps YTB at knees mini squat at rail 3x8  Tandem stance 3x10 UE reaches each side  Standing row RTB 2x15   12/01/22 Nustep L4x7 minutes UE and LE  Staggered stance goblet (R LE back) 4lbs 3x8 Airex standing high march 3x20 single UE support Sidestepping at rail with YTB at ankles x4 laps Fwd step ups 6" 2x10ea Tandem stance 3x10 UE reaches each side  Standing row RTB 2x15  11/29/22 Nustep L4x5 minutes UE and LE Sidestepping at rail with YTB at ankles x4 laps Hurdle step overs fwd x3 hurdles- reciprocal with single rail. Standing hip abduction YTB at ankles 2 x 10 bilateral   Standing hip extension YTB at ankles 2 x 10 bilateral  Lateral step up and over 6 inch 1 x 10 bilateral  Fwd step ups 6" x10ea Tandem stance 2 x 30 second holds bilateral Standing row RTB 2x15   11/27/22 Nustep L4x5 minutes UE and LE Sidestepping at rail with YTB at ankles x4 laps Standing hip abduction YTB at ankles 2 x 10 bilateral  Standing hip extension YTB at ankles 2 x 10 bilateral  Lateral step up and over 6 inch 1 x 10 bilateral  Cone taps with minimal unilateral UE support 2 x 5 bilateral Seated trunk flexion stretch with green ball 10 x 5-10 second holds Tandem stance 2 x 30 second holds bilateral Standing row RTB 1 x 15  11/25/22 Nustep L4x5 minutes UE and LE Gait- 240 ft with SPC Sidestepping at rail with YTB at ankles x2 laps Standing hip abduction YTB at knees 2 x 10 bilateral  Standing hip extension YTB at knees 2 x 10 bilateral  Hurdles fwd- step to x4hurdles- 2 laps Trialed LAQ but had pain in L low back LTR x10 Supine HS and ITB stretch L  11/20/22 Nustep L4x5 minutes UE and LE Standing hip abduction YTB at knees 2 x 10 bilateral  Standing hip extension YTB at knees 2 x 10 bilateral  Lateral step up 6 inch 1 x  10 bilateral  LAQ 1# 1 x 10 with 5 second holds Standing alternating march 2 x 10 bilateral Tandem stance 2 x 30 second holds bilateral Seated hip adduction 2 x 10 x 10 second holds  11/18/22 Nustep L3x5 minutes UE and LE Tandem stance 2 x 20 -30 second holds bilateral SLS with 3 way vectors with unilateral HHA 3 x 5 second holds bilateral  Lateral step up 6 inch 1 x 10 bilateral  Lateral stepping with perpendicular resistance 2 x 5 RTB bilateral   PATIENT EDUCATION:  Education details: anatomy, exercise progression, DOMS expectations, envelope of function, HEP, POC  Person educated: Patient Education method: Explanation, Demonstration, Tactile cues, Verbal cues, and Handouts Education comprehension: verbalized understanding, returned  demonstration, verbal cues required, and tactile cues required  HOME EXERCISE PROGRAM: Access Code: [email or web address] URL: [email or web address] Date: 08/11/2022 Prepared by: Zebedee Iba  Updated 11/18/22  ASSESSMENT:  CLINICAL IMPRESSION: Continued with strength, balance and endurance exercises. Performed resisted UE exercises for endurance and posture. Patient will continue to benefit from physical therapy in order to improve function and reduce impairment.      OBJECTIVE IMPAIRMENTS: Abnormal gait, decreased activity tolerance, decreased balance, decreased endurance, decreased knowledge of use of DME, decreased mobility, difficulty walking, decreased ROM, decreased strength, hypomobility, impaired flexibility, improper body mechanics, postural dysfunction, and pain.    ACTIVITY LIMITATIONS: carrying, lifting, bending, standing, squatting, stairs, transfers, bed mobility, and locomotion level   PARTICIPATION LIMITATIONS: meal prep, cleaning, laundry, driving, shopping, and community activity and exercise   PERSONAL FACTORS: Age, Fitness, Time since onset of injury/illness/exacerbation, and 1-2 comorbidities:    are also affecting patient's functional outcome.    REHAB POTENTIAL: Fair     CLINICAL DECISION MAKING: Evolving/moderate complexity   EVALUATION COMPLEXITY: Moderate     GOALS:     SHORT TERM GOALS: Target date: 09/22/2022    Pt will become independent with HEP in order to demonstrate synthesis of PT education.    Goal status: met   2.  Pt will be able to perform 5XSTS in under 12s  in order to demonstrate functional improvement above the cut off score for adults.   Goal status: ongoing   3.  Pt will be able to demonstrate ability to perform gait with upright posture and AD  in order to demonstrate functional improvement in LE function for safety with community ambulation    Goal status: met       LONG TERM GOALS: Target date:02/08/2023    Pt will be  able to demonstrate TUG in under 10 sec in order to demonstrate functional improvement in LE function, strength, balance, and mobility for safety with community ambulation.   Goal status: ongoing   Pt will decrease 5XSTS by at least 3 seconds in order to demonstrate clinically significant improvement in LE strength    Goal status: ongoing   3.  Pt will be able to demonstrate/report ability to walk >10 mins in order to demonstrate functional improvement and tolerance to exercise and community mobility.    Goal status: MET   4.  Pt will score >/= 57 on FOTO to demonstrate improvement in perceived mobility and function.    Goal status: ongoing        PLAN:   PT FREQUENCY: 1-2x/week   PT DURATION: 6 weeks   PLANNED INTERVENTIONS: Therapeutic exercises, Therapeutic activity, Neuromuscular re-education, Balance training, Gait training, Patient/Family education, Self Care, Joint mobilization, Joint manipulation, Stair training, Prosthetic training, DME instructions, Aquatic Therapy, Dry Needling,  Electrical stimulation, Spinal manipulation, Spinal mobilization, Moist heat, scar mobilization, Splintting, Taping, Vasopneumatic device, Traction, Ultrasound, Ionotophoresis 4mg /ml Dexamethasone, Manual therapy, and Re-evaluation   PLAN FOR NEXT SESSION: LE exercise for transfers/transfer mechanics, consider resistance circuits, standing balance exercise, functional activity tolerance    Reola Mosher Waldine Zenz, PT 12/22/2022, 2:24 PM

## 2022-12-29 ENCOUNTER — Other Ambulatory Visit: Payer: Self-pay | Admitting: Oncology

## 2022-12-29 ENCOUNTER — Encounter (HOSPITAL_BASED_OUTPATIENT_CLINIC_OR_DEPARTMENT_OTHER): Payer: Self-pay | Admitting: Physical Therapy

## 2022-12-29 ENCOUNTER — Ambulatory Visit (HOSPITAL_BASED_OUTPATIENT_CLINIC_OR_DEPARTMENT_OTHER): Payer: Medicare HMO | Attending: Oncology | Admitting: Physical Therapy

## 2022-12-29 DIAGNOSIS — R262 Difficulty in walking, not elsewhere classified: Secondary | ICD-10-CM | POA: Diagnosis not present

## 2022-12-29 DIAGNOSIS — M6281 Muscle weakness (generalized): Secondary | ICD-10-CM | POA: Insufficient documentation

## 2022-12-29 NOTE — Therapy (Signed)
OUTPATIENT PHYSICAL THERAPY LOWER EXTREMITY TREATMENT       Patient Name: Nancy Harrison MRN: [id number] DOB:04-25-1940, 82 y.o., female Today's Date: 12/29/2022  END OF SESSION:  PT End of Session - 12/29/22 1447     Visit Number 27    Number of Visits 32    Date for PT Re-Evaluation 02/08/23    Authorization Type [employer] MCR    Progress Note Due on Visit 25    PT Start Time 1445    PT Stop Time 1525    PT Time Calculation (min) 40 min    Equipment Utilized During Treatment Gait belt    Activity Tolerance Patient tolerated treatment well    Behavior During Therapy WFL for tasks assessed/performed                           Past Medical History:  Diagnosis Date   Arthritis    Barrett's esophagus    Path 10/2001   Breast cancer (HCC) 01/01/2011   Cancer (HCC) 1999   rt breast   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   Cataract    Family history of colon cancer    Gastrostomy in place [facility])    LUQ - placed 10/16/2021   Hypertension    Ileostomy, has currently (HCC)    placed 09/2021   SUI (stress urinary incontinence, female)    Thyroid disease    Urinary urgency    Past Surgical History:  Procedure Laterality Date   BREAST LUMPECTOMY  1999   Chemo and radiation on right breast   IR IMAGING GUIDED PORT INSERTION  11/11/2021   LAPAROSCOPY N/A 10/16/2021   Procedure: LAPAROSCOPIC  DIVERTING OSTOMY, BIOSPY,  G-TUBE PLACEMENT;  Surgeon: Karie Soda, MD;  Location: [facility];  Service: General;  Laterality: N/A;   TONSILLECTOMY  1957   TOTAL KNEE ARTHROPLASTY  1999 & 2001   Patient Active Problem List   Diagnosis Date Noted   Bilateral inguinal hernia without obstruction or gangrene 11/12/2022   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   Acute renal failure (HCC) 05/30/2022   Non-recurrent unilateral inguinal hernia without obstruction or gangrene 04/13/2022   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   Non-recurrent bilateral inguinal hernia without  obstruction or gangrene 04/06/2022   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   Genetic testing 12/03/2021   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   Nausea & vomiting 11/06/2021   Hyponatremia 11/06/2021   Family history of colon cancer 11/05/2021   ARF (acute renal failure) (HCC) 11/05/2021   Irritant contact dermatitis associated with digestive stoma    Gastrostomy tube dysfunction (HCC)    Irritant contact dermatitis associated with fecal stoma    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 10/17/2021   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) 10/17/2021   Ileostomy present (HCC) 10/17/2021   Colonic mass 10/15/2021   History of breast cancer 12/30/2011   Leukopenia 04/09/2011   Anxiety state 04/06/2007   Esophageal reflux 04/06/2007   Essential hypertension 04/06/2007   Hyperlipidemia 04/06/2007   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 As soon as she started moving around, it resolved.   PERTINENT HISTORY: Barrett's esophagus, breast ca, HTN, thyroid disease, stage IV extraovarian primary peritoneal carcinoma, laparascopic loop ileostomy 09/2021  Has had PT in the past for balance/falls   PAIN:  Are you having pain?no: NPRS scale: 0/10 Pain location: L hip Pain description: aching  Aggravating factors: "when I move my leg the wrong way it makes it hurt in my back"  (demonstrates marching) Relieving factors: not doing the motion that hurts   PRECAUTIONS: None  WEIGHT BEARING RESTRICTIONS: No  FALLS:  Has patient fallen in last 6  months? No  LIVING ENVIRONMENT: Lives with: lives with their spouse Lives in: House/apartment, has basement  (usually goes down there 1x/month)  Stairs: yes , 6 steps to enter with step to pattern  Has following equipment at home: Single point cane, walker, shower grab bars   OCCUPATION: N/A Leisure: reading, computer games   PLOF: Independent with basic ADLs  PATIENT GOALS: getting her strength back,    OBJECTIVE:   DIAGNOSTIC FINDINGS: no relevant MSK imaging  PATIENT SURVEYS:  FOTO 41 52 @ DC 8 pts MCII  FOTO 7/18 47 FOTO 7/30: 42  9/18  FOTO 47 10/2 FOTO  44      LOWER EXTREMITY MMT:  MMT Right 7/18 9/18 Left 7/18 9/18  Hip flexion 4/5 4+/5 4/5 4/5  Hip extension      Hip abduction 4/5  4/5   Hip adduction 4/5 4/5 4/5 4/5 p! Into back  Hip internal rotation      Hip external rotation      Knee flexion 4-/5 4/5 4-/5 4/5  Knee extension 4/5 4+/5 4/5 4/5 p! Into back  Ankle dorsiflexion      Ankle plantarflexion 4/5  4/5   Ankle inversion      Ankle eversion       (Blank rows = not tested)  FUNCTIONAL TESTS:  5 times sit to stand: 15.2s  Timed up and go (TUG): 14.3s  7/18 5XSTS 14.5s   7/18 TUG 12.9s  9/18 TUG 14.2s 9/18 5XSTS 11.9s  10/28 5XSTS 12.1s (less momentum used with transfer motion) 10/28  TUG 16.2s  4 stage balance: passed NBOS, semi tandem, unable to pass tandem <9s     GAIT: Distance walked: 74ft Assistive device utilized: Single point cane Level of assistance: Complete Independence Comments: shortened step length, shuffling gait, improved step height/foot clearance, less audible dragging of feet   TODAY'S TREATMENT:                                                                                                                              DATE:   11/6  Nustep L5x8 minutes UE and LE Balance beam tandem 4x laps Balance beam sidestepping 4x laps  3x20 6" box step tap HR/TRX 2x20 4" box step up minimal UE 3x8  4" box  lateral step up 2x8  12/22/22  Nustep L5x8 minutes UE and LE Mini squat at rail 3 x 8 Airex standing high march 3x20 single UE support Standing row RTB 2 x 15 Standing shoulder extension RTB 2 x 15  10/28  Nustep L5x8 minutes UE and LE  Edu of testing results  Standing box toe taps with UE support 2x20 Fwd step up 2x10 each side  Airex standing high march 3x20 single UE support Minisquat 4x6   12/15/22 Nustep L5x5 minutes UE and LE Airex standing high march 3x20 single UE support Step up with contralateral knee drive 1 x 10 bilateral YTB at knees mini squat at rail 3x8  YTB resisted STS  3x8 Sidestepping at rail with YTB at ankles 6x 8 feet Retro stepping  at rail with YTB at ankles 6 x 8 feet Standing shoulder extension 1 x 10 RTB   10/16 Nustep L5x8 minutes UE and LE  Hooklying SKTC 30s 3x each  DL bridge 1Y78 LTR 2N56 Supine LE ext without touching table 3x10 STS 3x8  YTB resisted STS  3x8 Airex standing high march 3x20 single UE support Sidestepping at rail with YTB at ankles x2 laps Retro stepping  at rail with YTB at ankles x2 laps YTB at knees mini squat at rail 3x8  Tandem stance 3x10 UE reaches each side  Standing row RTB 2x15  10/14 Nustep L5x8 minutes UE and LE  YTB resisted STS  3x8 Airex standing high march 3x20 single UE support Sidestepping at rail with YTB at ankles x2 laps Retro stepping  at rail with YTB at ankles x2 laps YTB at knees mini squat at rail 3x8  Tandem stance 3x10 UE reaches each side  Standing row RTB 2x15   12/01/22 Nustep L4x7 minutes UE and LE  Staggered stance goblet (R LE back) 4lbs 3x8 Airex standing high march 3x20 single UE support Sidestepping at rail with YTB at ankles x4 laps Fwd step ups 6" 2x10ea Tandem stance 3x10 UE reaches each side  Standing row RTB 2x15  11/29/22 Nustep L4x5 minutes UE and LE Sidestepping at rail with YTB at ankles x4 laps Hurdle step overs fwd x3 hurdles- reciprocal with single  rail. Standing hip abduction YTB at ankles 2 x 10 bilateral  Standing hip extension YTB at ankles 2 x 10 bilateral  Lateral step up and over 6 inch 1 x 10 bilateral  Fwd step ups 6" x10ea Tandem stance 2 x 30 second holds bilateral Standing row RTB 2x15   11/27/22 Nustep L4x5 minutes UE and LE Sidestepping at rail with YTB at ankles x4 laps Standing hip abduction YTB at ankles 2 x 10 bilateral  Standing hip extension YTB at ankles 2 x 10 bilateral  Lateral step up and over 6 inch 1 x 10 bilateral  Cone taps with minimal unilateral UE support 2 x 5 bilateral Seated trunk flexion stretch with green ball 10 x 5-10 second holds Tandem stance 2 x 30 second holds bilateral Standing row RTB 1 x 15  11/25/22 Nustep L4x5 minutes UE and LE Gait- 240 ft with SPC Sidestepping at rail with YTB at ankles x2 laps Standing hip abduction YTB at knees 2 x 10 bilateral  Standing hip extension YTB at knees 2 x 10 bilateral  Hurdles fwd- step to x4hurdles- 2 laps Trialed LAQ but had pain in L low back LTR x10 Supine HS and ITB stretch L  11/20/22 Nustep L4x5 minutes UE and LE Standing hip abduction YTB at knees 2 x 10 bilateral  Standing hip extension YTB at knees 2 x 10 bilateral  Lateral step up 6 inch 1 x 10 bilateral  LAQ 1# 1 x 10 with 5 second holds Standing alternating march 2 x 10 bilateral Tandem stance 2 x 30 second holds bilateral Seated hip adduction 2 x 10 x 10 second holds  11/18/22 Nustep L3x5 minutes UE and LE Tandem stance 2 x 20 -30 second holds bilateral SLS with 3 way vectors with unilateral HHA 3 x 5 second holds bilateral  Lateral step up 6 inch 1 x 10 bilateral  Lateral stepping with perpendicular resistance 2 x 5 RTB bilateral   PATIENT EDUCATION:  Education details: anatomy, exercise progression, DOMS expectations, envelope of function, HEP, POC  Person educated: Patient Education method: Explanation, Demonstration, Tactile cues, Verbal cues, and  Handouts Education comprehension: verbalized understanding, returned demonstration, verbal cues required, and tactile cues required  HOME EXERCISE PROGRAM: Access Code: [email or web address] URL: [email or web address] Date: 08/11/2022 Prepared by: Zebedee Iba  Updated 11/18/22  ASSESSMENT:  CLINICAL IMPRESSION: Pt able to return to LE exercise today with focus on endurance and balance. No pain noted into L hip. Plan for pt to start with referral to personal training and joining the gym and wrapping up with therapy within the next 4 sessions with independent HEP. Personal training referral placed. Patient will continue to benefit from physical therapy in order to improve function and reduce impairment.      OBJECTIVE IMPAIRMENTS: Abnormal gait, decreased activity tolerance, decreased balance, decreased endurance, decreased knowledge of use of DME, decreased mobility, difficulty walking, decreased ROM, decreased strength, hypomobility, impaired flexibility, improper body mechanics, postural dysfunction, and pain.    ACTIVITY LIMITATIONS: carrying, lifting, bending, standing, squatting, stairs, transfers, bed mobility, and locomotion level   PARTICIPATION LIMITATIONS: meal prep, cleaning, laundry, driving, shopping, and community activity and exercise   PERSONAL FACTORS: Age, Fitness, Time since onset of injury/illness/exacerbation, and 1-2 comorbidities:    are also affecting patient's functional outcome.    REHAB POTENTIAL: Fair     CLINICAL DECISION MAKING: Evolving/moderate complexity   EVALUATION COMPLEXITY: Moderate     GOALS:     SHORT TERM GOALS: Target date: 09/22/2022    Pt will become independent with HEP in order to demonstrate synthesis of PT education.    Goal status: met   2.  Pt will be able to perform 5XSTS in under 12s  in order to demonstrate functional improvement above the cut off score for adults.   Goal status: ongoing   3.  Pt will be able to  demonstrate ability to perform gait with upright posture and AD  in order to demonstrate functional improvement in LE function for safety with community ambulation    Goal status: met       LONG TERM GOALS: Target date:02/08/2023    Pt will be able to demonstrate TUG in under 10 sec in order to demonstrate functional improvement in LE function, strength, balance, and mobility for safety with community ambulation.   Goal status: ongoing   Pt will decrease 5XSTS by at least 3 seconds in order to demonstrate clinically significant improvement in LE strength    Goal status: ongoing   3.  Pt will be able to demonstrate/report ability to walk >10 mins in order to demonstrate functional improvement and tolerance to exercise and community mobility.    Goal status: MET   4.  Pt will score >/= 57 on FOTO to demonstrate improvement in perceived mobility and function.  Goal status: ongoing        PLAN:   PT FREQUENCY: 1-2x/week   PT DURATION: 6 weeks   PLANNED INTERVENTIONS: Therapeutic exercises, Therapeutic activity, Neuromuscular re-education, Balance training, Gait training, Patient/Family education, Self Care, Joint mobilization, Joint manipulation, Stair training, Prosthetic training, DME instructions, Aquatic Therapy, Dry Needling, Electrical stimulation, Spinal manipulation, Spinal mobilization, Moist heat, scar mobilization, Splintting, Taping, Vasopneumatic device, Traction, Ultrasound, Ionotophoresis 4mg /ml Dexamethasone, Manual therapy, and Re-evaluation   PLAN FOR NEXT SESSION: LE exercise for transfers/transfer mechanics, consider resistance circuits, standing balance exercise, functional activity tolerance    Zebedee Iba, PT 12/29/2022, 3:26 PM

## 2023-01-02 ENCOUNTER — Other Ambulatory Visit: Payer: Self-pay | Admitting: Oncology

## 2023-01-03 ENCOUNTER — Other Ambulatory Visit: Payer: Self-pay | Admitting: *Deleted

## 2023-01-03 MED ORDER — OLAPARIB 150 MG PO TABS: ORAL_TABLET | ORAL | 2 refills | Status: DC

## 2023-01-04 ENCOUNTER — Encounter: Payer: Self-pay | Admitting: Nurse Practitioner

## 2023-01-07 ENCOUNTER — Inpatient Hospital Stay: Payer: Medicare HMO | Attending: Oncology

## 2023-01-07 ENCOUNTER — Telehealth: Payer: Self-pay

## 2023-01-07 ENCOUNTER — Inpatient Hospital Stay: Payer: Medicare HMO

## 2023-01-07 ENCOUNTER — Inpatient Hospital Stay (HOSPITAL_BASED_OUTPATIENT_CLINIC_OR_DEPARTMENT_OTHER): Payer: Medicare HMO | Admitting: Oncology

## 2023-01-07 ENCOUNTER — Encounter: Payer: Self-pay | Admitting: *Deleted

## 2023-01-07 VITALS — BP 140/86 | HR 90 | Temp 98.3°F | Resp 16 | Ht 59.0 in | Wt 120.9 lb

## 2023-01-07 VITALS — BP 137/68 | HR 84

## 2023-01-07 DIAGNOSIS — C481 Malignant neoplasm of specified parts of peritoneum: Secondary | ICD-10-CM

## 2023-01-07 DIAGNOSIS — I10 Essential (primary) hypertension: Secondary | ICD-10-CM | POA: Insufficient documentation

## 2023-01-07 DIAGNOSIS — Z5112 Encounter for antineoplastic immunotherapy: Secondary | ICD-10-CM | POA: Diagnosis not present

## 2023-01-07 LAB — CMP (CANCER CENTER ONLY)
ALT: 13 U/L (ref 0–44)
AST: 21 U/L (ref 15–41)
Albumin: 4.3 g/dL (ref 3.5–5.0)
Alkaline Phosphatase: 46 U/L (ref 38–126)
Anion gap: 6 (ref 5–15)
BUN: 20 mg/dL (ref 8–23)
CO2: 28 mmol/L (ref 22–32)
Calcium: 9.2 mg/dL (ref 8.9–10.3)
Chloride: 105 mmol/L (ref 98–111)
Creatinine: 0.71 mg/dL (ref 0.44–1.00)
GFR, Estimated: >60 mL/min (ref >60)
Glucose, Bld: 151 mg/dL — ABNORMAL HIGH (ref 70–99)
Potassium: 4 mmol/L (ref 3.5–5.1)
Sodium: 139 mmol/L (ref 135–145)
Total Bilirubin: 0.5 mg/dL (ref <1.2)
Total Protein: 7 g/dL (ref 6.5–8.1)

## 2023-01-07 LAB — CBC WITH DIFFERENTIAL (CANCER CENTER ONLY)
Abs Immature Granulocytes: 0.01 10*3/uL (ref 0.00–0.07)
Basophils Absolute: 0.1 10*3/uL (ref 0.0–0.1)
Basophils Relative: 1 %
Eosinophils Absolute: 0.2 10*3/uL (ref 0.0–0.5)
Eosinophils Relative: 3 %
HCT: 38.9 % (ref 36.0–46.0)
Hemoglobin: 12.9 g/dL (ref 12.0–15.0)
Immature Granulocytes: 0 %
Lymphocytes Relative: 24 %
Lymphs Abs: 1.2 10*3/uL (ref 0.7–4.0)
MCH: 35 pg — ABNORMAL HIGH (ref 26.0–34.0)
MCHC: 33.2 g/dL (ref 30.0–36.0)
MCV: 105.4 fL — ABNORMAL HIGH (ref 80.0–100.0)
Monocytes Absolute: 0.5 10*3/uL (ref 0.1–1.0)
Monocytes Relative: 10 %
Neutro Abs: 3 10*3/uL (ref 1.7–7.7)
Neutrophils Relative %: 62 %
Platelet Count: 206 10*3/uL (ref 150–400)
RBC: 3.69 MIL/uL — ABNORMAL LOW (ref 3.87–5.11)
RDW: 14.8 % (ref 11.5–15.5)
WBC Count: 4.8 10*3/uL (ref 4.0–10.5)
nRBC: 0 % (ref 0.0–0.2)

## 2023-01-07 LAB — TOTAL PROTEIN, URINE DIPSTICK: Protein, ur: NEGATIVE mg/dL

## 2023-01-07 MED ORDER — SODIUM CHLORIDE 0.9 % IV SOLN: Freq: Once | INTRAVENOUS | Status: AC

## 2023-01-07 MED ORDER — HEPARIN SOD (PORK) LOCK FLUSH 100 UNIT/ML IV SOLN
500.0000 [IU] | Freq: Once | INTRAVENOUS | Status: AC | PRN
  Administered 2023-01-07: 500 [IU]

## 2023-01-07 MED ORDER — SODIUM CHLORIDE 0.9% FLUSH
10.0000 mL | INTRAVENOUS | Status: DC | PRN
  Administered 2023-01-07: 10 mL

## 2023-01-07 MED ORDER — SODIUM CHLORIDE 0.9 % IV SOLN
15.0000 mg/kg | Freq: Once | INTRAVENOUS | Status: AC
  Administered 2023-01-07: 800 mg via INTRAVENOUS
  Filled 2023-01-07: qty 32

## 2023-01-07 NOTE — Progress Notes (Signed)
[facility] OFFICE PROGRESS NOTE   Diagnosis: Peritoneal carcinoma  INTERVAL HISTORY:   [name] returns as scheduled.  She continues a olaparib.  No rash or diarrhea.  She empties the ileostomy bag approximately 3 times daily.  No bleeding or symptom of thrombosis.  Objective:  Vital signs in last 24 hours:  Blood pressure (!) 140/86, pulse 90, temperature 98.3 F (36.8 C), temperature source Oral, resp. rate 16, height 4\' 11"  (1.499 m), weight 120 lb 14.4 oz (54.8 kg), SpO2 97%.    HEENT: No thrush or ulcers Resp: Lungs clear bilaterally Cardio: Regular rate and rhythm GI: No hepatosplenomegaly, right abdomen ileostomy, no mass Vascular: No leg edema   Portacath/PICC-without erythema  Lab Results:  Lab Results  Component Value Date   WBC 4.8 01/07/2023   HGB 12.9 01/07/2023   HCT 38.9 01/07/2023   MCV 105.4 (H) 01/07/2023   PLT 206 01/07/2023   NEUTROABS 3.0 01/07/2023    CMP  Lab Results  Component Value Date   NA 139 01/07/2023   K 4.0 01/07/2023   CL 105 01/07/2023   CO2 28 01/07/2023   GLUCOSE 151 (H) 01/07/2023   BUN 20 01/07/2023   CREATININE 0.71 01/07/2023   CALCIUM 9.2 01/07/2023   PROT 7.0 01/07/2023   ALBUMIN 4.3 01/07/2023   AST 21 01/07/2023   ALT 13 01/07/2023   ALKPHOS 46 01/07/2023   BILITOT 0.5 01/07/2023   GFRNONAA >60 01/07/2023    Lab Results  Component Value Date   CEA1 2.5 10/18/2021    Lab Results  Component Value Date   INR 1.0 05/29/2022   LABPROT 13.0 05/29/2022    Imaging:  No results found.  Medications: I have reviewed the patient's current medications.   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   Bone lesions concerning for metastases, right lung nodule Laparoscopy, diverting loop ileostomy, gastrostomy tube placement, peritoneal biopsy 10/16/2021, no evidence of a primary tumor site at the appendix, ovaries, or uterus.  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  CDX2, CK20, and GATA3 negative.  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 Foundation 1-MSS, tumor mutation burden 5, HRD positive-LOH score 34.1% 08/11/2021 CA125 818 CT chest 11/03/2021-10 mm right lower lobe nodule, scattered tiny pulmonary nodules, peritoneal carcinomatosis Cycle 1 Taxol/carboplatin 11/12/2021 Cycle 2 Taxol/carboplatin 12/03/2021 Cycle 3 Taxol/carboplatin 12/24/2021 Bone scan 01/05/2022-negative for metastatic disease Cycle 4 Taxol/carboplatin 01/13/2022 CTs 01/22/2022-reduction in peritoneal carcinomatosis, no evidence of progressive disease stable bilateral lower lobe pulmonary nodules Cycle 5 Taxol/carboplatin 02/03/2022 Cycle 6 Taxol/carboplatin 02/24/2022 CT abdomen/pelvis 04/19/2022-slight decrease in peritoneal carcinomatosis with residual lesions 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   Abdominal distention/pain and diarrhea secondary to #1 3.   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  Lumpectomy and adjuvant CMF x8 followed by radiation 4. Hypertension 5. Hypothyroidism 6. Barrett's esophagus 7.  Family history of multiple cancers including appendix, colon, and lung cancer 8.  Hospital admission 11/05/2021 with dehydration/prerenal azotemia secondary to nausea and high output ileostomy 9.  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 Admission 05/29/2022 with dehydration and acute renal injury    Disposition: Mr with appears stable.  She is tolerating the a olaparib/bevacizumab well.  She will complete another cycle of bevacizumab today.  We will follow-up on the CA125 from today.  She will return for an office visit and Avastin in 3 weeks.  Thornton Papas, MD  01/07/2023  1:17 PM

## 2023-01-07 NOTE — Telephone Encounter (Signed)
Oral Oncology Patient Advocate Encounter  Reached out and spoke with patient regarding PAP paperwork, explained that I would send it to their preferred email via DocuSign.   Confirmed email address: [email or web address]    Patient expressed understanding and consent.  Will follow up once paperwork has been signed and returned.   Ardeen Fillers, CPhT Oncology Pharmacy Patient Advocate  [facility]  [phone] (phone) [phone] (fax) 01/07/2023 1:11 PM

## 2023-01-07 NOTE — Patient Instructions (Signed)
[facility] - [facility]   Discharge Instructions: Thank you for choosing [facility] to provide your oncology and hematology care.   If you have a lab appointment with the Cancer Center, please go directly to the Cancer Center and check in at the registration area.   Wear comfortable clothing and clothing appropriate for easy access to any Portacath or PICC line.   We strive to give you quality time with your provider. You may need to reschedule your appointment if you arrive late (15 or more minutes).  Arriving late affects you and other patients whose appointments are after yours.  Also, if you miss three or more appointments without notifying the office, you may be dismissed from the clinic at the provider's discretion.      For prescription refill requests, have your pharmacy contact our office and allow 72 hours for refills to be completed.    Today you received the following chemotherapy and/or immunotherapy agents Bevacizumab-bvzr (ZIRABEV).      To help prevent nausea and vomiting after your treatment, we encourage you to take your nausea medication as directed.  BELOW ARE SYMPTOMS THAT SHOULD BE REPORTED IMMEDIATELY: *FEVER GREATER THAN 100.4 F (38 C) OR HIGHER *CHILLS OR SWEATING *NAUSEA AND VOMITING THAT IS NOT CONTROLLED WITH YOUR NAUSEA MEDICATION *UNUSUAL SHORTNESS OF BREATH *UNUSUAL BRUISING OR BLEEDING *URINARY PROBLEMS (pain or burning when urinating, or frequent urination) *BOWEL PROBLEMS (unusual diarrhea, constipation, pain near the anus) TENDERNESS IN MOUTH AND THROAT WITH OR WITHOUT PRESENCE OF ULCERS (sore throat, sores in mouth, or a toothache) UNUSUAL RASH, SWELLING OR PAIN  UNUSUAL VAGINAL DISCHARGE OR ITCHING   Items with * indicate a potential emergency and should be followed up as soon as possible or go to the Emergency Department if any problems should occur.  Please show the CHEMOTHERAPY ALERT  CARD or IMMUNOTHERAPY ALERT CARD at check-in to the Emergency Department and triage nurse.  Should you have questions after your visit or need to cancel or reschedule your appointment, please contact [facility] - [facility]  Dept: [phone]  and follow the prompts.  Office hours are 8:00 a.m. to 4:30 p.m. Monday - Friday. Please note that voicemails left after 4:00 p.m. may not be returned until the following business day.  We are closed weekends and major holidays. You have access to a nurse at all times for urgent questions. Please call the main number to the clinic Dept: [phone] and follow the prompts.   For any non-urgent questions, you may also contact your provider using MyChart. We now offer e-Visits for anyone 18 and older to request care online for non-urgent symptoms. For details visit [email or web address].   Also download the MyChart app! Go to the app store, search "MyChart", open the app, select [facility], and log in with your MyChart username and password.   Bevacizumab Injection What is this medication? BEVACIZUMAB (be va SIZ yoo mab) treats some types of cancer. It works by blocking a protein that causes cancer cells to grow and multiply. This helps to slow or stop the spread of cancer cells. It is a monoclonal antibody. This medicine may be used for other purposes; ask your health care provider or pharmacist if you have questions. COMMON BRAND NAME(S): Alymsys, Avastin, MVASI, Omer Jack What should I tell my care team before I take this medication? They need to know if you have any  of these conditions: Blood clots Coughing up blood Having or recent surgery Heart failure High blood pressure History of a connection between 2 or more body parts that do not usually connect (fistula) History of a tear in your stomach or intestines Protein in your urine An unusual or allergic reaction to bevacizumab, other medications, foods,  dyes, or preservatives Pregnant or trying to get pregnant Breast-feeding How should I use this medication? This medication is injected into a vein. It is given by your care team in a hospital or clinic setting. Talk to your care team the use of this medication in children. Special care may be needed. Overdosage: If you think you have taken too much of this medicine contact a poison control center or emergency room at once. NOTE: This medicine is only for you. Do not share this medicine with others. What if I miss a dose? Keep appointments for follow-up doses. It is important not to miss your dose. Call your care team if you are unable to keep an appointment. What may interact with this medication? Interactions are not expected. This list may not describe all possible interactions. Give your health care provider a list of all the medicines, herbs, non-prescription drugs, or dietary supplements you use. Also tell them if you smoke, drink alcohol, or use illegal drugs. Some items may interact with your medicine. What should I watch for while using this medication? Your condition will be monitored carefully while you are receiving this medication. You may need blood work while taking this medication. This medication may make you feel generally unwell. This is not uncommon as chemotherapy can affect healthy cells as well as cancer cells. Report any side effects. Continue your course of treatment even though you feel ill unless your care team tells you to stop. This medication may increase your risk to bruise or bleed. Call your care team if you notice any unusual bleeding. Before having surgery, talk to your care team to make sure it is ok. This medication can increase the risk of poor healing of your surgical site or wound. You will need to stop this medication for 28 days before surgery. After surgery, wait at least 28 days before restarting this medication. Make sure the surgical site or wound is  healed enough before restarting this medication. Talk to your care team if questions. Talk to your care team if you may be pregnant. Serious birth defects can occur if you take this medication during pregnancy and for 6 months after the last dose. Contraception is recommended while taking this medication and for 6 months after the last dose. Your care team can help you find the option that works for you. Do not breastfeed while taking this medication and for 6 months after the last dose. This medication can cause infertility. Talk to your care team if you are concerned about your fertility. What side effects may I notice from receiving this medication? Side effects that you should report to your care team as soon as possible: Allergic reactions--skin rash, itching, hives, swelling of the face, lips, tongue, or throat Bleeding--bloody or black, tar-like stools, vomiting blood or brown material that looks like coffee grounds, red or dark brown urine, small red or purple spots on skin, unusual bruising or bleeding Blood clot--pain, swelling, or warmth in the leg, shortness of breath, chest pain Heart attack--pain or tightness in the chest, shoulders, arms, or jaw, nausea, shortness of breath, cold or clammy skin, feeling faint or lightheaded Heart failure--shortness of breath,  swelling of the ankles, feet, or hands, sudden weight gain, unusual weakness or fatigue Increase in blood pressure Infection--fever, chills, cough, sore throat, wounds that don't heal, pain or trouble when passing urine, general feeling of discomfort or being unwell Infusion reactions--chest pain, shortness of breath or trouble breathing, feeling faint or lightheaded Kidney injury--decrease in the amount of urine, swelling of the ankles, hands, or feet Stomach pain that is severe, does not go away, or gets worse Stroke--sudden numbness or weakness of the face, arm, or leg, trouble speaking, confusion, trouble walking, loss of  balance or coordination, dizziness, severe headache, change in vision Sudden and severe headache, confusion, change in vision, seizures, which may be signs of posterior reversible encephalopathy syndrome (PRES) Side effects that usually do not require medical attention (report to your care team if they continue or are bothersome): Back pain Change in taste Diarrhea Dry skin Increased tears Nosebleed This list may not describe all possible side effects. Call your doctor for medical advice about side effects. You may report side effects to FDA at 1-800-FDA-1088. Where should I keep my medication? This medication is given in a hospital or clinic. It will not be stored at home. NOTE: This sheet is a summary. It may not cover all possible information. If you have questions about this medicine, talk to your doctor, pharmacist, or health care provider.  2024 Elsevier/Gold Standard (2021-06-26 00:00:00)

## 2023-01-07 NOTE — Patient Instructions (Signed)

## 2023-01-07 NOTE — Progress Notes (Signed)
Patient seen by Dr. Thornton Papas today  Vitals are within treatment parameters:Yes   Labs are within treatment parameters: Yes   Treatment plan has been signed: Yes   Per physician team, Patient is ready for treatment and there are NO modifications to the treatment plan.

## 2023-01-07 NOTE — Telephone Encounter (Signed)
Oral Oncology Patient Advocate Encounter   Received notification that patient is due for re-enrollment for assistance for [name] through [employer] Prescription Savings Program.   Re-enrollment process has been initiated and will be submitted upon completion of necessary documents.  [employer]'s phone number [phone].   I will continue to follow until final determination.   Ardeen Fillers, CPhT Oncology Pharmacy Patient Advocate  [facility]  [phone] (phone) [phone] (fax) 01/07/2023 1:04 PM

## 2023-01-09 ENCOUNTER — Other Ambulatory Visit: Payer: Self-pay

## 2023-01-09 LAB — CA 125: Cancer Antigen (CA) 125: 27.9 U/mL (ref 0.0–38.1)

## 2023-01-10 NOTE — Telephone Encounter (Signed)
Received back patient signatures. I will submit once MD signatures are in hand. I will continue to follow and update until final determination.    Ardeen Fillers, CPhT Oncology Pharmacy Patient Advocate  [facility]  [phone] (phone) [phone] (fax) 01/10/2023 2:32 PM

## 2023-01-12 ENCOUNTER — Other Ambulatory Visit: Payer: Self-pay

## 2023-01-13 NOTE — Telephone Encounter (Signed)
Oral Oncology Patient Advocate Encounter   Submitted application for assistance for [name] to [employer] Pres[employer] Savings Program.   Application submitted via e-fax to [phone]   [employer]'s phone number [phone].   I will continue to check the status until final determination.    Ardeen Fillers, CPhT Oncology Pharmacy Patient Advocate  [facility]  [phone] (phone) [phone] (fax) 01/13/2023 8:06 AM

## 2023-01-17 DIAGNOSIS — Z932 Ileostomy status: Secondary | ICD-10-CM | POA: Diagnosis not present

## 2023-01-21 ENCOUNTER — Other Ambulatory Visit: Payer: Self-pay | Admitting: Oncology

## 2023-01-24 ENCOUNTER — Encounter: Payer: Self-pay | Admitting: Oncology

## 2023-01-25 DIAGNOSIS — Z932 Ileostomy status: Secondary | ICD-10-CM | POA: Diagnosis not present

## 2023-01-26 ENCOUNTER — Inpatient Hospital Stay: Payer: Medicare HMO | Attending: Oncology

## 2023-01-26 ENCOUNTER — Inpatient Hospital Stay: Payer: Medicare HMO | Admitting: Oncology

## 2023-01-26 ENCOUNTER — Inpatient Hospital Stay: Payer: Medicare HMO

## 2023-01-26 ENCOUNTER — Other Ambulatory Visit: Payer: Medicare HMO

## 2023-01-26 VITALS — BP 139/82 | HR 99 | Temp 98.2°F | Resp 18 | Ht 59.0 in | Wt 121.5 lb

## 2023-01-26 DIAGNOSIS — C481 Malignant neoplasm of specified parts of peritoneum: Secondary | ICD-10-CM | POA: Insufficient documentation

## 2023-01-26 DIAGNOSIS — C9000 Multiple myeloma not having achieved remission: Secondary | ICD-10-CM | POA: Diagnosis not present

## 2023-01-26 DIAGNOSIS — Z5112 Encounter for antineoplastic immunotherapy: Secondary | ICD-10-CM | POA: Insufficient documentation

## 2023-01-26 LAB — CMP (CANCER CENTER ONLY)
ALT: 12 U/L (ref 0–44)
AST: 19 U/L (ref 15–41)
Albumin: 3.9 g/dL (ref 3.5–5.0)
Alkaline Phosphatase: 47 U/L (ref 38–126)
Anion gap: 8 (ref 5–15)
BUN: 20 mg/dL (ref 8–23)
CO2: 26 mmol/L (ref 22–32)
Calcium: 9.4 mg/dL (ref 8.9–10.3)
Chloride: 106 mmol/L (ref 98–111)
Creatinine: 0.76 mg/dL (ref 0.44–1.00)
GFR, Estimated: >60 mL/min (ref >60)
Glucose, Bld: 181 mg/dL — ABNORMAL HIGH (ref 70–99)
Potassium: 3.9 mmol/L (ref 3.5–5.1)
Sodium: 140 mmol/L (ref 135–145)
Total Bilirubin: 0.6 mg/dL (ref <1.2)
Total Protein: 6.9 g/dL (ref 6.5–8.1)

## 2023-01-26 LAB — CBC WITH DIFFERENTIAL (CANCER CENTER ONLY)
Abs Immature Granulocytes: 0.01 10*3/uL (ref 0.00–0.07)
Basophils Absolute: 0 10*3/uL (ref 0.0–0.1)
Basophils Relative: 1 %
Eosinophils Absolute: 0.1 10*3/uL (ref 0.0–0.5)
Eosinophils Relative: 3 %
HCT: 40.6 % (ref 36.0–46.0)
Hemoglobin: 13.3 g/dL (ref 12.0–15.0)
Immature Granulocytes: 0 %
Lymphocytes Relative: 26 %
Lymphs Abs: 1.3 10*3/uL (ref 0.7–4.0)
MCH: 35.4 pg — ABNORMAL HIGH (ref 26.0–34.0)
MCHC: 32.8 g/dL (ref 30.0–36.0)
MCV: 108 fL — ABNORMAL HIGH (ref 80.0–100.0)
Monocytes Absolute: 0.4 10*3/uL (ref 0.1–1.0)
Monocytes Relative: 8 %
Neutro Abs: 2.9 10*3/uL (ref 1.7–7.7)
Neutrophils Relative %: 62 %
Platelet Count: 203 10*3/uL (ref 150–400)
RBC: 3.76 MIL/uL — ABNORMAL LOW (ref 3.87–5.11)
RDW: 14.7 % (ref 11.5–15.5)
WBC Count: 4.8 10*3/uL (ref 4.0–10.5)
nRBC: 0 % (ref 0.0–0.2)

## 2023-01-26 LAB — TOTAL PROTEIN, URINE DIPSTICK: Protein, ur: 30 mg/dL — AB

## 2023-01-26 NOTE — Progress Notes (Signed)
[facility] OFFICE PROGRESS NOTE   Diagnosis: Peritoneal carcinoma  INTERVAL HISTORY:   Nancy Harrison returns as scheduled.  She continues olaparib.  No rash, diarrhea, or mouth sores.  Good appetite.  No bleeding or symptom of thrombosis.  Objective:  Vital signs in last 24 hours:  Blood pressure 139/82, pulse 99, temperature 98.2 F (36.8 C), temperature source Temporal, resp. rate 18, height 4\' 11"  (1.499 m), weight 121 lb 8 oz (55.1 kg), SpO2 98%.    HEENT: No thrush or ulcers Resp: Lungs clear bilaterally Cardio: Regular rate and rhythm GI: Mid abdomen ostomy, no hepatosplenomegaly, no mass, no apparent ascites Vascular: No leg edema  Portacath/PICC-without erythema  Lab Results:  Lab Results  Component Value Date   WBC 4.8 01/26/2023   HGB 13.3 01/26/2023   HCT 40.6 01/26/2023   MCV 108.0 (H) 01/26/2023   PLT 203 01/26/2023   NEUTROABS 2.9 01/26/2023    CMP  Lab Results  Component Value Date   NA 139 01/07/2023   K 4.0 01/07/2023   CL 105 01/07/2023   CO2 28 01/07/2023   GLUCOSE 151 (H) 01/07/2023   BUN 20 01/07/2023   CREATININE 0.71 01/07/2023   CALCIUM 9.2 01/07/2023   PROT 7.0 01/07/2023   ALBUMIN 4.3 01/07/2023   AST 21 01/07/2023   ALT 13 01/07/2023   ALKPHOS 46 01/07/2023   BILITOT 0.5 01/07/2023   GFRNONAA >60 01/07/2023    Lab Results  Component Value Date   CEA1 2.5 10/18/2021    Lab Results  Component Value Date   INR 1.0 05/29/2022   LABPROT 13.0 05/29/2022    Imaging:  No results found.  Medications: I have reviewed the patient's current medications.   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  Bone lesions  concerning for metastases, right lung nodule Laparoscopy, diverting loop ileostomy, gastrostomy tube placement, peritoneal biopsy 10/16/2021, no evidence of a primary tumor site at the appendix, ovaries, or uterus.  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  CDX2, CK20, and GATA3 negative.  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 Foundation 1-MSS, tumor mutation burden 5, HRD positive-LOH score 34.1% 08/11/2021 CA125 818 CT chest 11/03/2021-10 mm right lower lobe nodule, scattered tiny pulmonary nodules, peritoneal carcinomatosis Cycle 1 Taxol/carboplatin 11/12/2021 Cycle 2 Taxol/carboplatin 12/03/2021 Cycle 3 Taxol/carboplatin 12/24/2021 Bone scan 01/05/2022-negative for metastatic disease Cycle 4 Taxol/carboplatin 01/13/2022 CTs 01/22/2022-reduction in peritoneal carcinomatosis, no evidence of progressive disease stable bilateral lower lobe pulmonary nodules Cycle 5 Taxol/carboplatin 02/03/2022 Cycle 6 Taxol/carboplatin 02/24/2022 CT abdomen/pelvis 04/19/2022-slight decrease in peritoneal carcinomatosis with residual lesions 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   Abdominal distention/pain and diarrhea secondary to #1 3.   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  Lumpectomy and adjuvant CMF x8 followed by radiation 4. Hypertension 5. Hypothyroidism 6. Barrett's esophagus 7. Family history  of multiple cancers  including appendix, colon, and lung cancer 8.  Hospital admission 11/05/2021 with dehydration/prerenal azotemia secondary to nausea and high output ileostomy 9.  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 Admission 05/29/2022 with dehydration and acute renal injury      Disposition: Nancy Harrison has peritoneal carcinoma.  She appears to be tolerating the olaparib and bevacizumab well.  She will continue the current treatment.  We will follow-up on the CA125 from today.  She would like to delay a restaging CT until after the holidays.  She will return for an office visit and bevacizumab 02/23/2022.  Thornton Papas, MD  01/26/2023  11:36 AM

## 2023-01-27 ENCOUNTER — Other Ambulatory Visit: Payer: Self-pay

## 2023-01-27 LAB — CA 125: Cancer Antigen (CA) 125: 29.5 U/mL (ref 0.0–38.1)

## 2023-01-28 ENCOUNTER — Inpatient Hospital Stay: Payer: Medicare HMO

## 2023-01-28 ENCOUNTER — Inpatient Hospital Stay: Payer: Medicare HMO | Admitting: Nurse Practitioner

## 2023-01-28 VITALS — BP 148/78 | HR 79 | Temp 98.1°F | Resp 18

## 2023-01-28 DIAGNOSIS — C481 Malignant neoplasm of specified parts of peritoneum: Secondary | ICD-10-CM

## 2023-01-28 DIAGNOSIS — Z5112 Encounter for antineoplastic immunotherapy: Secondary | ICD-10-CM | POA: Diagnosis not present

## 2023-01-28 MED ORDER — SODIUM CHLORIDE 0.9% FLUSH
10.0000 mL | INTRAVENOUS | Status: DC | PRN
  Administered 2023-01-28: 10 mL

## 2023-01-28 MED ORDER — HEPARIN SOD (PORK) LOCK FLUSH 100 UNIT/ML IV SOLN
500.0000 [IU] | Freq: Once | INTRAVENOUS | Status: AC | PRN
  Administered 2023-01-28: 500 [IU]

## 2023-01-28 MED ORDER — SODIUM CHLORIDE 0.9 % IV SOLN
15.0000 mg/kg | Freq: Once | INTRAVENOUS | Status: AC
  Administered 2023-01-28: 800 mg via INTRAVENOUS
  Filled 2023-01-28: qty 32

## 2023-01-28 MED ORDER — SODIUM CHLORIDE 0.9 % IV SOLN: Freq: Once | INTRAVENOUS | Status: AC

## 2023-01-28 NOTE — Patient Instructions (Signed)
[facility] - A [facility]PT [facility]  Discharge Instructions: Thank you for choosing [facility] to provide your oncology and hematology care.   If you have a lab appointment with the Cancer Center, please go directly to the Cancer Center and check in at the registration area.   Wear comfortable clothing and clothing appropriate for easy access to any Portacath or PICC line.   We strive to give you quality time with your provider. You may need to reschedule your appointment if you arrive late (15 or more minutes).  Arriving late affects you and other patients whose appointments are after yours.  Also, if you miss three or more appointments without notifying the office, you may be dismissed from the clinic at the provider's discretion.      For prescription refill requests, have your pharmacy contact our office and allow 72 hours for refills to be completed.    Today you received the following chemotherapy and/or immunotherapy agents Bevacizumab-bvzr      To help prevent nausea and vomiting after your treatment, we encourage you to take your nausea medication as directed.  BELOW ARE SYMPTOMS THAT SHOULD BE REPORTED IMMEDIATELY: *FEVER GREATER THAN 100.4 F (38 C) OR HIGHER *CHILLS OR SWEATING *NAUSEA AND VOMITING THAT IS NOT CONTROLLED WITH YOUR NAUSEA MEDICATION *UNUSUAL SHORTNESS OF BREATH *UNUSUAL BRUISING OR BLEEDING *URINARY PROBLEMS (pain or burning when urinating, or frequent urination) *BOWEL PROBLEMS (unusual diarrhea, constipation, pain near the anus) TENDERNESS IN MOUTH AND THROAT WITH OR WITHOUT PRESENCE OF ULCERS (sore throat, sores in mouth, or a toothache) UNUSUAL RASH, SWELLING OR PAIN  UNUSUAL VAGINAL DISCHARGE OR ITCHING   Items with * indicate a potential emergency and should be followed up as soon as possible or go to the Emergency Department if any problems should occur.  Please show the CHEMOTHERAPY ALERT CARD or  IMMUNOTHERAPY ALERT CARD at check-in to the Emergency Department and triage nurse.  Should you have questions after your visit or need to cancel or reschedule your appointment, please contact [facility]  Dept: [phone]  and follow the prompts.  Office hours are 8:00 a.m. to 4:30 p.m. Monday - Friday. Please note that voicemails left after 4:00 p.m. may not be returned until the following business day.  We are closed weekends and major holidays. You have access to a nurse at all times for urgent questions. Please call the main number to the clinic Dept: [phone] and follow the prompts.   For any non-urgent questions, you may also contact your provider using MyChart. We now offer e-Visits for anyone 17 and older to request care online for non-urgent symptoms. For details visit [email or web address].   Also download the MyChart app! Go to the app store, search "MyChart", open the app, select [place], and log in with your MyChart username and password.

## 2023-02-03 ENCOUNTER — Other Ambulatory Visit: Payer: Self-pay

## 2023-02-06 ENCOUNTER — Other Ambulatory Visit: Payer: Self-pay

## 2023-02-13 ENCOUNTER — Other Ambulatory Visit: Payer: Self-pay

## 2023-02-24 ENCOUNTER — Inpatient Hospital Stay: Payer: Medicare HMO

## 2023-02-24 ENCOUNTER — Inpatient Hospital Stay: Payer: Medicare HMO | Attending: Oncology

## 2023-02-24 ENCOUNTER — Encounter: Payer: Self-pay | Admitting: Nurse Practitioner

## 2023-02-24 ENCOUNTER — Other Ambulatory Visit: Payer: Medicare HMO

## 2023-02-24 ENCOUNTER — Inpatient Hospital Stay: Payer: Medicare HMO | Admitting: Nurse Practitioner

## 2023-02-24 VITALS — BP 145/74 | HR 92 | Temp 98.2°F | Resp 18 | Ht 59.0 in | Wt 122.8 lb

## 2023-02-24 VITALS — BP 145/68 | HR 81 | Temp 98.2°F | Resp 18

## 2023-02-24 DIAGNOSIS — Z5112 Encounter for antineoplastic immunotherapy: Secondary | ICD-10-CM | POA: Diagnosis not present

## 2023-02-24 DIAGNOSIS — C481 Malignant neoplasm of specified parts of peritoneum: Secondary | ICD-10-CM

## 2023-02-24 DIAGNOSIS — Z95828 Presence of other vascular implants and grafts: Secondary | ICD-10-CM

## 2023-02-24 LAB — CBC WITH DIFFERENTIAL (CANCER CENTER ONLY)
Abs Immature Granulocytes: 0.01 10*3/uL (ref 0.00–0.07)
Basophils Absolute: 0.1 10*3/uL (ref 0.0–0.1)
Basophils Relative: 1 %
Eosinophils Absolute: 0.2 10*3/uL (ref 0.0–0.5)
Eosinophils Relative: 4 %
HCT: 41.1 % (ref 36.0–46.0)
Hemoglobin: 13.5 g/dL (ref 12.0–15.0)
Immature Granulocytes: 0 %
Lymphocytes Relative: 22 %
Lymphs Abs: 1.2 10*3/uL (ref 0.7–4.0)
MCH: 34.9 pg — ABNORMAL HIGH (ref 26.0–34.0)
MCHC: 32.8 g/dL (ref 30.0–36.0)
MCV: 106.2 fL — ABNORMAL HIGH (ref 80.0–100.0)
Monocytes Absolute: 0.6 10*3/uL (ref 0.1–1.0)
Monocytes Relative: 11 %
Neutro Abs: 3.4 10*3/uL (ref 1.7–7.7)
Neutrophils Relative %: 62 %
Platelet Count: 205 10*3/uL (ref 150–400)
RBC: 3.87 MIL/uL (ref 3.87–5.11)
RDW: 14.8 % (ref 11.5–15.5)
WBC Count: 5.4 10*3/uL (ref 4.0–10.5)
nRBC: 0 % (ref 0.0–0.2)

## 2023-02-24 LAB — TOTAL PROTEIN, URINE DIPSTICK

## 2023-02-24 LAB — CMP (CANCER CENTER ONLY)
ALT: 12 U/L (ref 0–44)
AST: 21 U/L (ref 15–41)
Albumin: 4.2 g/dL (ref 3.5–5.0)
Alkaline Phosphatase: 52 U/L (ref 38–126)
Anion gap: 6 (ref 5–15)
BUN: 21 mg/dL (ref 8–23)
CO2: 31 mmol/L (ref 22–32)
Calcium: 9.5 mg/dL (ref 8.9–10.3)
Chloride: 104 mmol/L (ref 98–111)
Creatinine: 0.75 mg/dL (ref 0.44–1.00)
GFR, Estimated: >60 mL/min (ref >60)
Glucose, Bld: 94 mg/dL (ref 70–99)
Potassium: 4.2 mmol/L (ref 3.5–5.1)
Sodium: 141 mmol/L (ref 135–145)
Total Bilirubin: 0.6 mg/dL (ref 0.0–1.2)
Total Protein: 7.3 g/dL (ref 6.5–8.1)

## 2023-02-24 MED ORDER — SODIUM CHLORIDE 0.9% FLUSH
10.0000 mL | INTRAVENOUS | Status: DC | PRN
  Administered 2023-02-24: 10 mL

## 2023-02-24 MED ORDER — HEPARIN SOD (PORK) LOCK FLUSH 100 UNIT/ML IV SOLN
500.0000 [IU] | Freq: Once | INTRAVENOUS | Status: AC | PRN
  Administered 2023-02-24: 500 [IU]

## 2023-02-24 MED ORDER — SODIUM CHLORIDE 0.9 % IV SOLN
Freq: Once | INTRAVENOUS | Status: AC
Start: 2023-02-24 — End: 2023-02-24

## 2023-02-24 MED ORDER — SODIUM CHLORIDE 0.9 % IV SOLN
15.0000 mg/kg | Freq: Once | INTRAVENOUS | Status: AC
  Administered 2023-02-24: 800 mg via INTRAVENOUS
  Filled 2023-02-24: qty 32

## 2023-02-24 NOTE — Patient Instructions (Signed)

## 2023-02-24 NOTE — Progress Notes (Signed)
 [facility] OFFICE PROGRESS NOTE   Diagnosis: Peritoneal carcinoma  INTERVAL HISTORY:   Ms. Konczal returns as scheduled.  She continues olaparib .  She continues bevacizumab  every 3 weeks.  Overall she feels well.  No bleeding.  No diarrhea.  No nausea or vomiting.  Appetite is fair to [place].  No abdominal pain.  Objective:  Vital signs in last 24 hours:  Blood pressure (!) 145/74, pulse 92, temperature 98.2 F (36.8 C), temperature source Temporal, resp. rate 18, height 4' 11 (1.499 m), weight 122 lb 12.8 oz (55.7 kg), SpO2 98%.    HEENT: No thrush or ulcers. Resp: Lungs clear bilaterally. Cardio: Regular rate and rhythm. GI: Abdomen soft and nontender.  No hepatosplenomegaly.  Abdomen ostomy.  Formed stool in the collection bag. Vascular: No leg edema. Port-A-Cath without erythema.  Lab Results:  Lab Results  Component Value Date   WBC 5.4 02/24/2023   HGB 13.5 02/24/2023   HCT 41.1 02/24/2023   MCV 106.2 (H) 02/24/2023   PLT 205 02/24/2023   NEUTROABS 3.4 02/24/2023    Imaging:  No results found.  Medications: I have reviewed the patient's current medications.  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  Bone lesions concerning for metastases, right lung nodule Laparoscopy, diverting loop ileostomy, gastrostomy tube placement, peritoneal biopsy 10/16/2021, no evidence of a primary tumor site at the appendix, ovaries, or uterus.  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  CDX2, CK20, and GATA3  negative.  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 Foundation 1-MSS, tumor mutation burden 5, HRD positive-LOH score 34.1% 08/11/2021 CA125 818 CT chest 11/03/2021-10 mm right lower lobe nodule, scattered tiny pulmonary nodules, peritoneal carcinomatosis Cycle 1 Taxol /carboplatin  11/12/2021 Cycle 2 Taxol /carboplatin  12/03/2021 Cycle 3 Taxol /carboplatin  12/24/2021 Bone scan 01/05/2022-negative for metastatic disease Cycle 4 Taxol /carboplatin  01/13/2022 CTs 01/22/2022-reduction in peritoneal carcinomatosis, no evidence of progressive disease stable bilateral lower lobe pulmonary nodules Cycle 5 Taxol /carboplatin  02/03/2022 Cycle 6 Taxol /carboplatin  02/24/2022 CT abdomen/pelvis 04/19/2022-slight decrease in peritoneal carcinomatosis with residual lesions 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   Abdominal distention/pain and diarrhea secondary to #1 3.   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  Lumpectomy and adjuvant CMF x8 followed by radiation 4. Hypertension 5. Hypothyroidism 6. Barrett's esophagus 7. Family history of multiple cancers including appendix, colon, and lung cancer 8.  Hospital admission 11/05/2021 with dehydration/prerenal azotemia secondary to nausea and high output ileostomy 9.  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 Admission 05/29/2022 with dehydration and acute renal injury      Disposition: Ms. Cormier appears stable.  She continues olaparib  and every 3-week bevacizumab .  She is  tolerating treatment well.  Plan to proceed with bevacizumab  today as scheduled.  Restaging CTs prior to next office visit.  CBC and chemistry panel reviewed.  Labs adequate to proceed as above.  She will return for follow-up and bevacizumab  in 3 weeks.  We are available to see her sooner if needed.    Olam Ned ANP/GNP-BC   02/24/2023  11:29 AM

## 2023-02-24 NOTE — Patient Instructions (Signed)
 [facility]  Discharge Instructions: Thank you for choosing [facility] to provide your oncology and hematology care.   If you have a lab appointment with the Cancer Center, please go directly to the Cancer Center and check in at the registration area.   Wear comfortable clothing and clothing appropriate for easy access to any Portacath or PICC line.   We strive to give you quality time with your provider. You may need to reschedule your appointment if you arrive late (15 or more minutes).  Arriving late affects you and other patients whose appointments are after yours.  Also, if you miss three or more appointments without notifying the office, you may be dismissed from the clinic at the provider's discretion.      For prescription refill requests, have your pharmacy contact our office and allow 72 hours for refills to be completed.    Today you received the following chemotherapy and/or immunotherapy agents Bevacizumab-bvzr      To help prevent nausea and vomiting after your treatment, we encourage you to take your nausea medication as directed.  BELOW ARE SYMPTOMS THAT SHOULD BE REPORTED IMMEDIATELY: *FEVER GREATER THAN 100.4 F (38 C) OR HIGHER *CHILLS OR SWEATING *NAUSEA AND VOMITING THAT IS NOT CONTROLLED WITH YOUR NAUSEA MEDICATION *UNUSUAL SHORTNESS OF BREATH *UNUSUAL BRUISING OR BLEEDING *URINARY PROBLEMS (pain or burning when urinating, or frequent urination) *BOWEL PROBLEMS (unusual diarrhea, constipation, pain near the anus) TENDERNESS IN MOUTH AND THROAT WITH OR WITHOUT PRESENCE OF ULCERS (sore throat, sores in mouth, or a toothache) UNUSUAL RASH, SWELLING OR PAIN  UNUSUAL VAGINAL DISCHARGE OR ITCHING   Items with * indicate a potential emergency and should be followed up as soon as possible or go to the Emergency Department if any problems should occur.  Please show the CHEMOTHERAPY ALERT CARD or  IMMUNOTHERAPY ALERT CARD at check-in to the Emergency Department and triage nurse.  Should you have questions after your visit or need to cancel or reschedule your appointment, please contact [facility]  Dept: [phone]  and follow the prompts.  Office hours are 8:00 a.m. to 4:30 p.m. Monday - Friday. Please note that voicemails left after 4:00 p.m. may not be returned until the following business day.  We are closed weekends and major holidays. You have access to a nurse at all times for urgent questions. Please call the main number to the clinic Dept: [phone] and follow the prompts.   For any non-urgent questions, you may also contact your provider using MyChart. We now offer e-Visits for anyone 17 and older to request care online for non-urgent symptoms. For details visit [email or web address].   Also download the MyChart app! Go to the app store, search "MyChart", open the app, select [place], and log in with your MyChart username and password.

## 2023-02-24 NOTE — Progress Notes (Signed)
 Patient seen by Olam Ned NP today  Vitals are within treatment parameters:Yes   Labs are within treatment parameters: Yes Urine has trace  Treatment plan has been signed: Yes   Per physician team, Patient is ready for treatment and there are NO modifications to the treatment plan.

## 2023-02-25 ENCOUNTER — Other Ambulatory Visit: Payer: Self-pay

## 2023-02-25 DIAGNOSIS — Z932 Ileostomy status: Secondary | ICD-10-CM | POA: Diagnosis not present

## 2023-02-25 LAB — CA 125: Cancer Antigen (CA) 125: 36.1 U/mL (ref 0.0–38.1)

## 2023-03-10 ENCOUNTER — Telehealth: Payer: Self-pay

## 2023-03-10 ENCOUNTER — Other Ambulatory Visit (HOSPITAL_COMMUNITY): Payer: Self-pay

## 2023-03-10 ENCOUNTER — Encounter: Payer: Self-pay | Admitting: Oncology

## 2023-03-10 NOTE — Telephone Encounter (Signed)
 Error

## 2023-03-10 NOTE — Telephone Encounter (Signed)
Oral Oncology Patient Advocate Encounter  Prior Authorization for Garey Ham has been approved.    PA# [id number]  Effective dates: 02/23/23 through 02/22/24  Patients co-pay is $1,990.48.   CancerCare Grant Obtained to make co-pay $0.00.    Ardeen Fillers, CPhT Oncology Pharmacy Patient Advocate  [facility]  [phone] (phone) [phone] (fax) 03/10/2023 9:50 AM

## 2023-03-10 NOTE — Telephone Encounter (Signed)
Oral Oncology Patient Advocate Encounter   **PAP to [facility] in Jan 2025**  Was successful in securing patient a $5,000.00 grant from [employer] to provide copayment coverage for Nancy Harrison.  This will keep the out of pocket expense at $0.    The billing information is as follows and has been shared with [facility].   Member ID: [id number] Group ID: [id number] RxBin: [id number] PCN: [id number] Dates of Eligibility: 03/02/23 through 03/01/24  Fund name:  Ovarian Cancer.   Ardeen Fillers, CPhT Oncology Pharmacy Patient Advocate  [facility]  [phone] (phone) [phone] (fax) 03/10/2023 9:01 AM

## 2023-03-10 NOTE — Telephone Encounter (Signed)
Oral Oncology Patient Advocate Encounter  Re-authorization   Received notification that prior authorization for Nancy Harrison is due for renewal.   PA submitted on 03/10/23  Key [id number]  Status is pending     Ardeen Fillers, CPhT Oncology Pharmacy Patient Advocate  [facility]  [phone] (phone) [phone] (fax) 03/10/2023 9:40 AM

## 2023-03-13 ENCOUNTER — Other Ambulatory Visit: Payer: Self-pay | Admitting: Oncology

## 2023-03-13 DIAGNOSIS — C481 Malignant neoplasm of specified parts of peritoneum: Secondary | ICD-10-CM

## 2023-03-16 ENCOUNTER — Ambulatory Visit (HOSPITAL_BASED_OUTPATIENT_CLINIC_OR_DEPARTMENT_OTHER)
Admission: RE | Admit: 2023-03-16 | Discharge: 2023-03-16 | Disposition: A | Discharge status: Home / Self Care | Payer: Medicare HMO | Source: Ambulatory Visit | Attending: Nurse Practitioner | Admitting: Nurse Practitioner

## 2023-03-16 DIAGNOSIS — N200 Calculus of kidney: Secondary | ICD-10-CM | POA: Diagnosis not present

## 2023-03-16 DIAGNOSIS — C786 Secondary malignant neoplasm of retroperitoneum and peritoneum: Secondary | ICD-10-CM | POA: Diagnosis not present

## 2023-03-16 DIAGNOSIS — C481 Malignant neoplasm of specified parts of peritoneum: Secondary | ICD-10-CM | POA: Insufficient documentation

## 2023-03-16 MED ORDER — HEPARIN SOD (PORK) LOCK FLUSH 100 UNIT/ML IV SOLN
500.0000 [IU] | Freq: Once | INTRAVENOUS | Status: AC
  Administered 2023-03-16: 500 [IU] via INTRAVENOUS

## 2023-03-16 MED ORDER — IOHEXOL 300 MG/ML  SOLN
100.0000 mL | Freq: Once | INTRAMUSCULAR | Status: AC | PRN
Start: 2023-03-16 — End: 2023-03-16
  Administered 2023-03-16: 100 mL via INTRAVENOUS

## 2023-03-17 ENCOUNTER — Inpatient Hospital Stay (HOSPITAL_BASED_OUTPATIENT_CLINIC_OR_DEPARTMENT_OTHER): Payer: Medicare HMO | Admitting: Oncology

## 2023-03-17 ENCOUNTER — Inpatient Hospital Stay: Payer: Medicare HMO

## 2023-03-17 VITALS — BP 127/77 | HR 97 | Temp 98.2°F | Resp 20 | Ht 59.0 in | Wt 121.0 lb

## 2023-03-17 VITALS — BP 161/77 | HR 86 | Resp 18

## 2023-03-17 DIAGNOSIS — C481 Malignant neoplasm of specified parts of peritoneum: Secondary | ICD-10-CM

## 2023-03-17 DIAGNOSIS — Z5112 Encounter for antineoplastic immunotherapy: Secondary | ICD-10-CM | POA: Diagnosis not present

## 2023-03-17 LAB — CMP (CANCER CENTER ONLY)
ALT: 14 U/L (ref 0–44)
AST: 23 U/L (ref 15–41)
Albumin: 4.5 g/dL (ref 3.5–5.0)
Alkaline Phosphatase: 51 U/L (ref 38–126)
Anion gap: 9 (ref 5–15)
BUN: 22 mg/dL (ref 8–23)
CO2: 25 mmol/L (ref 22–32)
Calcium: 9.7 mg/dL (ref 8.9–10.3)
Chloride: 103 mmol/L (ref 98–111)
Creatinine: 0.84 mg/dL (ref 0.44–1.00)
GFR, Estimated: >60 mL/min (ref >60)
Glucose, Bld: 135 mg/dL — ABNORMAL HIGH (ref 70–99)
Potassium: 4 mmol/L (ref 3.5–5.1)
Sodium: 137 mmol/L (ref 135–145)
Total Bilirubin: 0.7 mg/dL (ref 0.0–1.2)
Total Protein: 7.5 g/dL (ref 6.5–8.1)

## 2023-03-17 LAB — CBC WITH DIFFERENTIAL (CANCER CENTER ONLY)
Abs Immature Granulocytes: 0.02 10*3/uL (ref 0.00–0.07)
Basophils Absolute: 0.1 10*3/uL (ref 0.0–0.1)
Basophils Relative: 1 %
Eosinophils Absolute: 0.2 10*3/uL (ref 0.0–0.5)
Eosinophils Relative: 5 %
HCT: 42.8 % (ref 36.0–46.0)
Hemoglobin: 13.9 g/dL (ref 12.0–15.0)
Immature Granulocytes: 1 %
Lymphocytes Relative: 26 %
Lymphs Abs: 1.2 10*3/uL (ref 0.7–4.0)
MCH: 35.1 pg — ABNORMAL HIGH (ref 26.0–34.0)
MCHC: 32.5 g/dL (ref 30.0–36.0)
MCV: 108.1 fL — ABNORMAL HIGH (ref 80.0–100.0)
Monocytes Absolute: 0.4 10*3/uL (ref 0.1–1.0)
Monocytes Relative: 9 %
Neutro Abs: 2.6 10*3/uL (ref 1.7–7.7)
Neutrophils Relative %: 58 %
Platelet Count: 231 10*3/uL (ref 150–400)
RBC: 3.96 MIL/uL (ref 3.87–5.11)
RDW: 14.6 % (ref 11.5–15.5)
WBC Count: 4.4 10*3/uL (ref 4.0–10.5)
nRBC: 0 % (ref 0.0–0.2)

## 2023-03-17 LAB — TOTAL PROTEIN, URINE DIPSTICK

## 2023-03-17 MED ORDER — SODIUM CHLORIDE 0.9 % IV SOLN: Freq: Once | INTRAVENOUS | Status: AC

## 2023-03-17 MED ORDER — HEPARIN SOD (PORK) LOCK FLUSH 100 UNIT/ML IV SOLN
500.0000 [IU] | Freq: Once | INTRAVENOUS | Status: AC | PRN
  Administered 2023-03-17: 500 [IU]

## 2023-03-17 MED ORDER — SODIUM CHLORIDE 0.9% FLUSH
10.0000 mL | INTRAVENOUS | Status: DC | PRN
  Administered 2023-03-17: 10 mL

## 2023-03-17 MED ORDER — SODIUM CHLORIDE 0.9 % IV SOLN
15.0000 mg/kg | Freq: Once | INTRAVENOUS | Status: AC
  Administered 2023-03-17: 800 mg via INTRAVENOUS
  Filled 2023-03-17: qty 32

## 2023-03-17 NOTE — Progress Notes (Signed)
Patient seen by Dr. Thornton Papas today  Vitals are within treatment parameters:Yes   Labs are within treatment parameters: Yes  Urine protein trace-OK  Treatment plan has been signed: Yes   Per physician team, Patient is ready for treatment and there are NO modifications to the treatment plan.

## 2023-03-17 NOTE — Patient Instructions (Signed)
[facility]   Discharge Instructions: Thank you for choosing [facility] to provide your oncology and hematology care.   If you have a lab appointment with the Cancer Center, please go directly to the Cancer Center and check in at the registration area.   Wear comfortable clothing and clothing appropriate for easy access to any Portacath or PICC line.   We strive to give you quality time with your provider. You may need to reschedule your appointment if you arrive late (15 or more minutes).  Arriving late affects you and other patients whose appointments are after yours.  Also, if you miss three or more appointments without notifying the office, you may be dismissed from the clinic at the provider's discretion.      For prescription refill requests, have your pharmacy contact our office and allow 72 hours for refills to be completed.    Today you received the following chemotherapy and/or immunotherapy agents Bevacizumab-bvzr (ZIRABEV).      To help prevent nausea and vomiting after your treatment, we encourage you to take your nausea medication as directed.  BELOW ARE SYMPTOMS THAT SHOULD BE REPORTED IMMEDIATELY: *FEVER GREATER THAN 100.4 F (38 C) OR HIGHER *CHILLS OR SWEATING *NAUSEA AND VOMITING THAT IS NOT CONTROLLED WITH YOUR NAUSEA MEDICATION *UNUSUAL SHORTNESS OF BREATH *UNUSUAL BRUISING OR BLEEDING *URINARY PROBLEMS (pain or burning when urinating, or frequent urination) *BOWEL PROBLEMS (unusual diarrhea, constipation, pain near the anus) TENDERNESS IN MOUTH AND THROAT WITH OR WITHOUT PRESENCE OF ULCERS (sore throat, sores in mouth, or a toothache) UNUSUAL RASH, SWELLING OR PAIN  UNUSUAL VAGINAL DISCHARGE OR ITCHING   Items with * indicate a potential emergency and should be followed up as soon as possible or go to the Emergency Department if any problems should occur.  Please show the CHEMOTHERAPY ALERT  CARD or IMMUNOTHERAPY ALERT CARD at check-in to the Emergency Department and triage nurse.  Should you have questions after your visit or need to cancel or reschedule your appointment, please contact [facility]  Dept: [phone]  and follow the prompts.  Office hours are 8:00 a.m. to 4:30 p.m. Monday - Friday. Please note that voicemails left after 4:00 p.m. may not be returned until the following business day.  We are closed weekends and major holidays. You have access to a nurse at all times for urgent questions. Please call the main number to the clinic Dept: [phone] and follow the prompts.   For any non-urgent questions, you may also contact your provider using MyChart. We now offer e-Visits for anyone 32 and older to request care online for non-urgent symptoms. For details visit [email or web address].   Also download the MyChart app! Go to the app store, search "MyChart", open the app, select [place], and log in with your MyChart username and password.  Bevacizumab Injection What is this medication? BEVACIZUMAB (be va SIZ yoo mab) treats some types of cancer. It works by blocking a protein that causes cancer cells to grow and multiply. This helps to slow or stop the spread of cancer cells. It is a monoclonal antibody. This medicine may be used for other purposes; ask your health care provider or pharmacist if you have questions. COMMON BRAND NAME(S): Alymsys, Avastin, MVASI, Rosaland Lao What should I tell my care team before I take this medication? They need to know if you have any  of these conditions: Blood clots Coughing up blood Having or recent surgery Heart failure High blood pressure History of a connection between 2 or more body parts that do not usually connect (fistula) History of a tear in your stomach or intestines Protein in your urine An unusual or allergic reaction to bevacizumab, other medications,  foods, dyes, or preservatives Pregnant or trying to get pregnant Breast-feeding How should I use this medication? This medication is injected into a vein. It is given by your care team in a hospital or clinic setting. Talk to your care team the use of this medication in children. Special care may be needed. Overdosage: If you think you have taken too much of this medicine contact a poison control center or emergency room at once. NOTE: This medicine is only for you. Do not share this medicine with others. What if I miss a dose? Keep appointments for follow-up doses. It is important not to miss your dose. Call your care team if you are unable to keep an appointment. What may interact with this medication? Interactions are not expected. This list may not describe all possible interactions. Give your health care provider a list of all the medicines, herbs, non-prescription drugs, or dietary supplements you use. Also tell them if you smoke, drink alcohol, or use illegal drugs. Some items may interact with your medicine. What should I watch for while using this medication? Your condition will be monitored carefully while you are receiving this medication. You may need blood work while taking this medication. This medication may make you feel generally unwell. This is not uncommon as chemotherapy can affect healthy cells as well as cancer cells. Report any side effects. Continue your course of treatment even though you feel ill unless your care team tells you to stop. This medication may increase your risk to bruise or bleed. Call your care team if you notice any unusual bleeding. Before having surgery, talk to your care team to make sure it is ok. This medication can increase the risk of poor healing of your surgical site or wound. You will need to stop this medication for 28 days before surgery. After surgery, wait at least 28 days before restarting this medication. Make sure the surgical site or wound  is healed enough before restarting this medication. Talk to your care team if questions. Talk to your care team if you may be pregnant. Serious birth defects can occur if you take this medication during pregnancy and for 6 months after the last dose. Contraception is recommended while taking this medication and for 6 months after the last dose. Your care team can help you find the option that works for you. Do not breastfeed while taking this medication and for 6 months after the last dose. This medication can cause infertility. Talk to your care team if you are concerned about your fertility. What side effects may I notice from receiving this medication? Side effects that you should report to your care team as soon as possible: Allergic reactions--skin rash, itching, hives, swelling of the face, lips, tongue, or throat Bleeding--bloody or black, tar-like stools, vomiting blood or brown material that looks like coffee grounds, red or dark brown urine, small red or purple spots on skin, unusual bruising or bleeding Blood clot--pain, swelling, or warmth in the leg, shortness of breath, chest pain Heart attack--pain or tightness in the chest, shoulders, arms, or jaw, nausea, shortness of breath, cold or clammy skin, feeling faint or lightheaded Heart failure--shortness of breath,  swelling of the ankles, feet, or hands, sudden weight gain, unusual weakness or fatigue Increase in blood pressure Infection--fever, chills, cough, sore throat, wounds that don't heal, pain or trouble when passing urine, general feeling of discomfort or being unwell Infusion reactions--chest pain, shortness of breath or trouble breathing, feeling faint or lightheaded Kidney injury--decrease in the amount of urine, swelling of the ankles, hands, or feet Stomach pain that is severe, does not go away, or gets worse Stroke--sudden numbness or weakness of the face, arm, or leg, trouble speaking, confusion, trouble walking, loss of  balance or coordination, dizziness, severe headache, change in vision Sudden and severe headache, confusion, change in vision, seizures, which may be signs of posterior reversible encephalopathy syndrome (PRES) Side effects that usually do not require medical attention (report to your care team if they continue or are bothersome): Back pain Change in taste Diarrhea Dry skin Increased tears Nosebleed This list may not describe all possible side effects. Call your doctor for medical advice about side effects. You may report side effects to FDA at 1-800-FDA-1088. Where should I keep my medication? This medication is given in a hospital or clinic. It will not be stored at home. NOTE: This sheet is a summary. It may not cover all possible information. If you have questions about this medicine, talk to your doctor, pharmacist, or health care provider.  2024 Elsevier/Gold Standard (2021-06-26 00:00:00)

## 2023-03-17 NOTE — Progress Notes (Signed)
Nancy Harrison OFFICE PROGRESS NOTE   Diagnosis: Peritoneal carcinoma  INTERVAL HISTORY:   Nancy Harrison returns as scheduled.  She continues olaparib.  She was last treated with bevacizumab on 02/24/2023.  No rash.  She empties the ostomy approximately 3 times daily.  She is exercising at the gym twice weekly and also uses a home exercise bike.  Objective:  Vital signs in last 24 hours:  Blood pressure 127/77, pulse 97, temperature 98.2 F (36.8 C), temperature source Temporal, resp. rate 20, height 4\' 11"  (1.499 m), weight 121 lb (54.9 kg), SpO2 100%.    HEENT: No thrush or ulcers Resp: Lungs clear bilaterally Cardio: Regular rate and rhythm GI: No hepatosplenomegaly, no apparent ascites, no mass, right abdomen ostomy with formed brown stool Vascular: No leg edema  Portacath/PICC-without erythema  Lab Results:  Lab Results  Component Value Date   WBC 4.4 03/17/2023   HGB 13.9 03/17/2023   HCT 42.8 03/17/2023   MCV 108.1 (H) 03/17/2023   PLT 231 03/17/2023   NEUTROABS 2.6 03/17/2023    CMP  Lab Results  Component Value Date   NA 141 02/24/2023   K 4.2 02/24/2023   CL 104 02/24/2023   CO2 31 02/24/2023   GLUCOSE 94 02/24/2023   BUN 21 02/24/2023   CREATININE 0.75 02/24/2023   CALCIUM 9.5 02/24/2023   PROT 7.3 02/24/2023   ALBUMIN 4.2 02/24/2023   AST 21 02/24/2023   ALT 12 02/24/2023   ALKPHOS 52 02/24/2023   BILITOT 0.6 02/24/2023   GFRNONAA >60 02/24/2023    Lab Results  Component Value Date   CEA1 2.5 10/18/2021    Lab Results  Component Value Date   INR 1.0 05/29/2022   LABPROT 13.0 05/29/2022    Imaging:  CT ABDOMEN PELVIS W CONTRAST Result Date: 03/16/2023 CLINICAL DATA:  Peritoneal carcinomatosis. Ovarian cancer restaging. * Tracking Code: BO * EXAM: CT ABDOMEN AND PELVIS WITH CONTRAST TECHNIQUE: Multidetector CT imaging of the abdomen and pelvis was performed using the standard protocol following bolus administration of  intravenous contrast. RADIATION DOSE REDUCTION: This exam was performed according to the departmental dose-optimization program which includes automated exposure control, adjustment of the mA and/or kV according to patient size and/or use of iterative reconstruction technique. CONTRAST:  OMNIPAQUE IOHEXOL 300 MG/ML  SOLN COMPARISON:  09/06/2022.  04/19/2022 FINDINGS: Lower chest: Stable 12 mm right lower lobe pulmonary nodule. Hepatobiliary: No suspicious focal abnormality within the liver parenchyma. Foci of dystrophic calcification along the anterior hepatic dome are stable. There is no evidence for gallstones, gallbladder wall thickening, or pericholecystic fluid. No intrahepatic or extrahepatic biliary dilation. Pancreas: No focal mass lesion. No dilatation of the main duct. No intraparenchymal cyst. No peripancreatic edema. Spleen: Calcified granulomata. Adrenals/Urinary Tract: No adrenal nodule or mass. 4 mm nonobstructing stone identified upper pole left kidney. Tiny well-defined homogeneous low-density lesions in both kidneys are too small to characterize but are statistically most likely benign and probably cysts. No followup imaging is recommended. No evidence for hydroureter. The urinary bladder appears normal for the degree of distention. Stomach/Bowel: Stomach is unremarkable. No gastric wall thickening. No evidence of outlet obstruction. Duodenum is normally positioned as is the ligament of Treitz. No small bowel wall thickening. No small bowel dilatation. 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 No complicating features. The terminal ileum is normal. The appendix is normal. No gross colonic mass. No colonic wall thickening. Diverticular changes are noted in the left  colon without evidence of diverticulitis. Vascular/Lymphatic: There is mild atherosclerotic calcification of the abdominal aorta without aneurysm. There is no gastrohepatic or  hepatoduodenal ligament lymphadenopathy. No retroperitoneal or mesenteric lymphadenopathy. No pelvic sidewall lymphadenopathy. Reproductive: The uterus is unremarkable.  There is no adnexal mass. Other: No substantial intraperitoneal free fluid. Ill-defined nodular soft tissue density in the gastrocolic ligament is similar to prior. Nodular conglomeration measured previously at 3.5 x 0.9 cm is 3.7 by 1.0 cm today on image 35/2. Just lateral to that finding is another 12 mm soft tissue nodule on 33/2. no new or progressive soft tissue evident in the mesentery or along the peritoneal surfaces. Musculoskeletal: No worrisome lytic or sclerotic osseous abnormality. Advanced degenerative changes noted lumbar spine. IMPRESSION: 1. Stable exam. No new or progressive findings in the abdomen or pelvis. 2. Ill-defined nodular soft tissue density in the gastrocolic ligament is similar to prior. 3. Stable 12 mm right lower lobe pulmonary nodule. 4. 4 mm nonobstructing stone upper pole left kidney. 5. Small left adrenal nodule seen previously not well demonstrated today. 6. Left colonic diverticulosis without diverticulitis. 7.  Aortic Atherosclerosis (ICD10-I70.0). Electronically Signed   By: Kennith Harrison M.D.   On: 03/16/2023 10:54    Medications: I have reviewed the patient's current medications.   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  Bone lesions concerning for metastases, right lung nodule Laparoscopy, diverting loop ileostomy, gastrostomy tube placement, peritoneal biopsy 10/16/2021, no evidence of a primary tumor site at the appendix, ovaries, or uterus.  Diffuse peritoneal carcinomatosis Pathology of the lesser  curvature of stomach carcinomatosis biopsy-metastatic poorly differentiated carcinoma, CK7, PAX8, WT1, and p53 positive with focal labeling for p16.  CDX2, CK20, and GATA3 negative.  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 Foundation 1-MSS, tumor mutation burden 5, HRD positive-LOH score 34.1% 08/11/2021 CA125 818 CT chest 11/03/2021-10 mm right lower lobe nodule, scattered tiny pulmonary nodules, peritoneal carcinomatosis Cycle 1 Taxol/carboplatin 11/12/2021 Cycle 2 Taxol/carboplatin 12/03/2021 Cycle 3 Taxol/carboplatin 12/24/2021 Bone scan 01/05/2022-negative for metastatic disease Cycle 4 Taxol/carboplatin 01/13/2022 CTs 01/22/2022-reduction in peritoneal carcinomatosis, no evidence of progressive disease stable bilateral lower lobe pulmonary nodules Cycle 5 Taxol/carboplatin 02/03/2022 Cycle 6 Taxol/carboplatin 02/24/2022 CT abdomen/pelvis 04/19/2022-slight decrease in peritoneal carcinomatosis with residual lesions 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   Abdominal distention/pain and diarrhea secondary to #1 3.   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  Lumpectomy and adjuvant CMF x8 followed by radiation 4. Hypertension 5. Hypothyroidism 6. Barrett's esophagus 7. Family history of multiple cancers including appendix, colon, and lung cancer 8.  Hospital admission 11/05/2021  with dehydration/prerenal azotemia secondary to nausea and high output ileostomy 9.  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 Admission 05/29/2022 with dehydration and acute renal injury        Disposition: Nancy Harrison appears stable.  The restaging CT reveals no evidence of disease progression.  I recommend she continue Olaparib/bevacizumab.  She is tolerating the treatment well.  I reviewed the CT findings and images with her.  She will complete another cycle of bevacizumab today.  She will return for an office and lab visit in 3 weeks.  I encouraged her to obtain a pneumococcal vaccine.  Thornton Papas, MD  03/17/2023  10:45 AM

## 2023-03-18 ENCOUNTER — Other Ambulatory Visit: Payer: Self-pay

## 2023-03-18 LAB — CA 125: Cancer Antigen (CA) 125: 42.1 U/mL — ABNORMAL HIGH (ref 0.0–38.1)

## 2023-03-21 ENCOUNTER — Other Ambulatory Visit: Payer: Self-pay

## 2023-03-28 DIAGNOSIS — Z932 Ileostomy status: Secondary | ICD-10-CM | POA: Diagnosis not present

## 2023-03-29 ENCOUNTER — Other Ambulatory Visit: Payer: Self-pay

## 2023-04-07 ENCOUNTER — Inpatient Hospital Stay (HOSPITAL_BASED_OUTPATIENT_CLINIC_OR_DEPARTMENT_OTHER): Payer: Medicare HMO | Admitting: Nurse Practitioner

## 2023-04-07 ENCOUNTER — Inpatient Hospital Stay: Payer: Medicare HMO | Attending: Oncology

## 2023-04-07 ENCOUNTER — Inpatient Hospital Stay: Payer: Medicare HMO

## 2023-04-07 ENCOUNTER — Encounter: Payer: Self-pay | Admitting: Nurse Practitioner

## 2023-04-07 VITALS — BP 136/85 | HR 90 | Temp 98.2°F | Resp 18 | Ht 59.0 in | Wt 121.0 lb

## 2023-04-07 VITALS — BP 155/70 | HR 84 | Resp 18

## 2023-04-07 DIAGNOSIS — Z79620 Long term (current) use of immunosuppressive biologic: Secondary | ICD-10-CM | POA: Diagnosis not present

## 2023-04-07 DIAGNOSIS — C481 Malignant neoplasm of specified parts of peritoneum: Secondary | ICD-10-CM | POA: Insufficient documentation

## 2023-04-07 DIAGNOSIS — Z5112 Encounter for antineoplastic immunotherapy: Secondary | ICD-10-CM | POA: Diagnosis not present

## 2023-04-07 LAB — CBC WITH DIFFERENTIAL (CANCER CENTER ONLY)
Abs Immature Granulocytes: 0.02 10*3/uL (ref 0.00–0.07)
Basophils Absolute: 0.1 10*3/uL (ref 0.0–0.1)
Basophils Relative: 1 %
Eosinophils Absolute: 0.2 10*3/uL (ref 0.0–0.5)
Eosinophils Relative: 3 %
HCT: 40.5 % (ref 36.0–46.0)
Hemoglobin: 13.5 g/dL (ref 12.0–15.0)
Immature Granulocytes: 0 %
Lymphocytes Relative: 17 %
Lymphs Abs: 1 10*3/uL (ref 0.7–4.0)
MCH: 35.9 pg — ABNORMAL HIGH (ref 26.0–34.0)
MCHC: 33.3 g/dL (ref 30.0–36.0)
MCV: 107.7 fL — ABNORMAL HIGH (ref 80.0–100.0)
Monocytes Absolute: 0.5 10*3/uL (ref 0.1–1.0)
Monocytes Relative: 9 %
Neutro Abs: 3.8 10*3/uL (ref 1.7–7.7)
Neutrophils Relative %: 70 %
Platelet Count: 209 10*3/uL (ref 150–400)
RBC: 3.76 MIL/uL — ABNORMAL LOW (ref 3.87–5.11)
RDW: 14.2 % (ref 11.5–15.5)
WBC Count: 5.6 10*3/uL (ref 4.0–10.5)
nRBC: 0 % (ref 0.0–0.2)

## 2023-04-07 LAB — CMP (CANCER CENTER ONLY)
ALT: 14 U/L (ref 0–44)
AST: 22 U/L (ref 15–41)
Albumin: 4 g/dL (ref 3.5–5.0)
Alkaline Phosphatase: 46 U/L (ref 38–126)
Anion gap: 7 (ref 5–15)
BUN: 25 mg/dL — ABNORMAL HIGH (ref 8–23)
CO2: 28 mmol/L (ref 22–32)
Calcium: 9.7 mg/dL (ref 8.9–10.3)
Chloride: 104 mmol/L (ref 98–111)
Creatinine: 0.81 mg/dL (ref 0.44–1.00)
GFR, Estimated: >60 mL/min (ref >60)
Glucose, Bld: 95 mg/dL (ref 70–99)
Potassium: 4.1 mmol/L (ref 3.5–5.1)
Sodium: 139 mmol/L (ref 135–145)
Total Bilirubin: 0.5 mg/dL (ref 0.0–1.2)
Total Protein: 7.3 g/dL (ref 6.5–8.1)

## 2023-04-07 LAB — TOTAL PROTEIN, URINE DIPSTICK: Protein, ur: 30 mg/dL — AB

## 2023-04-07 MED ORDER — SODIUM CHLORIDE 0.9 % IV SOLN: Freq: Once | INTRAVENOUS | Status: AC

## 2023-04-07 MED ORDER — SODIUM CHLORIDE 0.9 % IV SOLN
15.0000 mg/kg | Freq: Once | INTRAVENOUS | Status: AC
  Administered 2023-04-07: 800 mg via INTRAVENOUS
  Filled 2023-04-07: qty 32

## 2023-04-07 MED ORDER — HEPARIN SOD (PORK) LOCK FLUSH 100 UNIT/ML IV SOLN
500.0000 [IU] | Freq: Once | INTRAVENOUS | Status: AC | PRN
Start: 2023-04-07 — End: 2023-04-07
  Administered 2023-04-07: 500 [IU]

## 2023-04-07 MED ORDER — SODIUM CHLORIDE 0.9% FLUSH
10.0000 mL | INTRAVENOUS | Status: DC | PRN
  Administered 2023-04-07: 10 mL

## 2023-04-07 NOTE — Progress Notes (Signed)
Patient seen by Lonna Cobb NP today  Vitals are within treatment parameters:Yes   Labs are within treatment parameters: Yes urin protein 30. Its okay to proceed  Treatment plan has been signed: Yes   Per physician team, Patient is ready for treatment and there are NO modifications to the treatment plan.

## 2023-04-07 NOTE — Patient Instructions (Signed)
[facility] [address]   Discharge Instructions: Thank you for choosing [facility] to provide your oncology and hematology care.   If you have a lab appointment with the Cancer Center, please go directly to the Cancer Center and check in at the registration area.   Wear comfortable clothing and clothing appropriate for easy access to any Portacath or PICC line.   We strive to give you quality time with your provider. You may need to reschedule your appointment if you arrive late (15 or more minutes).  Arriving late affects you and other patients whose appointments are after yours.  Also, if you miss three or more appointments without notifying the office, you may be dismissed from the clinic at the provider's discretion.      For prescription refill requests, have your pharmacy contact our office and allow 72 hours for refills to be completed.    Today you received the following chemotherapy and/or immunotherapy agents Bevacizumab-bvzr (ZIRABEV).      To help prevent nausea and vomiting after your treatment, we encourage you to take your nausea medication as directed.  BELOW ARE SYMPTOMS THAT SHOULD BE REPORTED IMMEDIATELY: *FEVER GREATER THAN 100.4 F (38 C) OR HIGHER *CHILLS OR SWEATING *NAUSEA AND VOMITING THAT IS NOT CONTROLLED WITH YOUR NAUSEA MEDICATION *UNUSUAL SHORTNESS OF BREATH *UNUSUAL BRUISING OR BLEEDING *URINARY PROBLEMS (pain or burning when urinating, or frequent urination) *BOWEL PROBLEMS (unusual diarrhea, constipation, pain near the anus) TENDERNESS IN MOUTH AND THROAT WITH OR WITHOUT PRESENCE OF ULCERS (sore throat, sores in mouth, or a toothache) UNUSUAL RASH, SWELLING OR PAIN  UNUSUAL VAGINAL DISCHARGE OR ITCHING   Items with * indicate a potential emergency and should be followed up as soon as possible or go to the Emergency Department if any problems should occur.  Please show the CHEMOTHERAPY ALERT  CARD or IMMUNOTHERAPY ALERT CARD at check-in to the Emergency Department and triage nurse.  Should you have questions after your visit or need to cancel or reschedule your appointment, please contact [facility]  Dept: [phone]  and follow the prompts.  Office hours are 8:00 a.m. to 4:30 p.m. Monday - Friday. Please note that voicemails left after 4:00 p.m. may not be returned until the following business day.  We are closed weekends and major holidays. You have access to a nurse at all times for urgent questions. Please call the main number to the clinic Dept: [phone] and follow the prompts.   For any non-urgent questions, you may also contact your provider using MyChart. We now offer e-Visits for anyone 70 and older to request care online for non-urgent symptoms. For details visit [email or web address].   Also download the MyChart app! Go to the app store, search "MyChart", open the app, select [facility], and log in with your MyChart username and password.  Bevacizumab Injection What is this medication? BEVACIZUMAB (be va SIZ yoo mab) treats some types of cancer. It works by blocking a protein that causes cancer cells to grow and multiply. This helps to slow or stop the spread of cancer cells. It is a monoclonal antibody. This medicine may be used for other purposes; ask your health care provider or pharmacist if you have questions. COMMON BRAND NAME(S): Alymsys, Avastin, MVASI, Rosaland Lao What should I tell my care team before I take this medication? They need to know if you have any  of these conditions: Blood clots Coughing up blood Having or recent surgery Heart failure High blood pressure History of a connection between 2 or more body parts that do not usually connect (fistula) History of a tear in your stomach or intestines Protein in your urine An unusual or allergic reaction to bevacizumab, other medications,  foods, dyes, or preservatives Pregnant or trying to get pregnant Breast-feeding How should I use this medication? This medication is injected into a vein. It is given by your care team in a hospital or clinic setting. Talk to your care team the use of this medication in children. Special care may be needed. Overdosage: If you think you have taken too much of this medicine contact a poison control center or emergency room at once. NOTE: This medicine is only for you. Do not share this medicine with others. What if I miss a dose? Keep appointments for follow-up doses. It is important not to miss your dose. Call your care team if you are unable to keep an appointment. What may interact with this medication? Interactions are not expected. This list may not describe all possible interactions. Give your health care provider a list of all the medicines, herbs, non-prescription drugs, or dietary supplements you use. Also tell them if you smoke, drink alcohol, or use illegal drugs. Some items may interact with your medicine. What should I watch for while using this medication? Your condition will be monitored carefully while you are receiving this medication. You may need blood work while taking this medication. This medication may make you feel generally unwell. This is not uncommon as chemotherapy can affect healthy cells as well as cancer cells. Report any side effects. Continue your course of treatment even though you feel ill unless your care team tells you to stop. This medication may increase your risk to bruise or bleed. Call your care team if you notice any unusual bleeding. Before having surgery, talk to your care team to make sure it is ok. This medication can increase the risk of poor healing of your surgical site or wound. You will need to stop this medication for 28 days before surgery. After surgery, wait at least 28 days before restarting this medication. Make sure the surgical site or wound  is healed enough before restarting this medication. Talk to your care team if questions. Talk to your care team if you may be pregnant. Serious birth defects can occur if you take this medication during pregnancy and for 6 months after the last dose. Contraception is recommended while taking this medication and for 6 months after the last dose. Your care team can help you find the option that works for you. Do not breastfeed while taking this medication and for 6 months after the last dose. This medication can cause infertility. Talk to your care team if you are concerned about your fertility. What side effects may I notice from receiving this medication? Side effects that you should report to your care team as soon as possible: Allergic reactions--skin rash, itching, hives, swelling of the face, lips, tongue, or throat Bleeding--bloody or black, tar-like stools, vomiting blood or brown material that looks like coffee grounds, red or dark brown urine, small red or purple spots on skin, unusual bruising or bleeding Blood clot--pain, swelling, or warmth in the leg, shortness of breath, chest pain Heart attack--pain or tightness in the chest, shoulders, arms, or jaw, nausea, shortness of breath, cold or clammy skin, feeling faint or lightheaded Heart failure--shortness of breath,  swelling of the ankles, feet, or hands, sudden weight gain, unusual weakness or fatigue Increase in blood pressure Infection--fever, chills, cough, sore throat, wounds that don't heal, pain or trouble when passing urine, general feeling of discomfort or being unwell Infusion reactions--chest pain, shortness of breath or trouble breathing, feeling faint or lightheaded Kidney injury--decrease in the amount of urine, swelling of the ankles, hands, or feet Stomach pain that is severe, does not go away, or gets worse Stroke--sudden numbness or weakness of the face, arm, or leg, trouble speaking, confusion, trouble walking, loss of  balance or coordination, dizziness, severe headache, change in vision Sudden and severe headache, confusion, change in vision, seizures, which may be signs of posterior reversible encephalopathy syndrome (PRES) Side effects that usually do not require medical attention (report to your care team if they continue or are bothersome): Back pain Change in taste Diarrhea Dry skin Increased tears Nosebleed This list may not describe all possible side effects. Call your doctor for medical advice about side effects. You may report side effects to FDA at 1-800-FDA-1088. Where should I keep my medication? This medication is given in a hospital or clinic. It will not be stored at home. NOTE: This sheet is a summary. It may not cover all possible information. If you have questions about this medicine, talk to your doctor, pharmacist, or health care provider.  2024 Elsevier/Gold Standard (2021-06-26 00:00:00)

## 2023-04-07 NOTE — Progress Notes (Signed)
[facility] OFFICE PROGRESS NOTE   Diagnosis: Peritoneal Harrison  INTERVAL HISTORY:   Nancy Harrison returns as scheduled.  She continues olaparib.  She completed another cycle of bevacizumab 03/17/2023.  She denies nausea/vomiting.  She noticed a single mouth sore 2 days ago.  She thinks it has resolved.  Lately stools have been a little looser.  She continues Imodium.  No abdominal pain.  No rash.  Appetite is stable.  Objective:  Vital signs in last 24 hours:  Blood pressure 136/85, pulse 90, temperature 98.2 F (36.8 C), temperature source Temporal, resp. rate 18, height 4\' 11"  (1.499 m), weight 121 lb (54.9 kg), SpO2 96%.    HEENT: No thrush or ulcers. Resp: Lungs clear bilaterally. Cardio: Regular rate and rhythm. GI: Abdomen soft and nontender.  No hepatomegaly.  No mass.  Right abdomen ostomy with minimal stool in the collection bag. Vascular: No leg edema. Skin: No rash. Port-A-Cath without erythema.  Lab Results:  Lab Results  Component Value Date   WBC 5.6 04/07/2023   HGB 13.5 04/07/2023   HCT 40.5 04/07/2023   MCV 107.7 (H) 04/07/2023   PLT 209 04/07/2023   NEUTROABS 3.8 04/07/2023    Imaging:  No results found.  Medications: I have reviewed the patient's current medications.  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  Bone lesions concerning for metastases, right lung nodule Laparoscopy, diverting loop ileostomy, gastrostomy tube placement, peritoneal biopsy 10/16/2021, no evidence of a primary tumor site at the appendix, ovaries, or uterus.  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  CDX2, CK20, and GATA3 negative.  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 Foundation 1-MSS, tumor mutation burden 5, HRD positive-LOH score 34.1% 08/11/2021 CA125 818 CT chest 11/03/2021-10 mm right lower lobe nodule, scattered tiny pulmonary nodules, peritoneal carcinomatosis Cycle 1 Taxol/carboplatin 11/12/2021 Cycle 2 Taxol/carboplatin 12/03/2021 Cycle 3 Taxol/carboplatin 12/24/2021 Bone scan 01/05/2022-negative for metastatic disease Cycle 4 Taxol/carboplatin 01/13/2022 CTs 01/22/2022-reduction in peritoneal carcinomatosis, no evidence of progressive disease stable bilateral lower lobe pulmonary nodules Cycle 5 Taxol/carboplatin 02/03/2022 Cycle 6 Taxol/carboplatin 02/24/2022 CT abdomen/pelvis 04/19/2022-slight decrease in peritoneal carcinomatosis with residual lesions 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   Abdominal distention/pain and diarrhea secondary to #1 3.   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  Lumpectomy and adjuvant CMF x8 followed by radiation 4. Hypertension 5. Hypothyroidism 6. Barrett's esophagus 7. Family history of multiple cancers including appendix, colon, and lung cancer 8.  Hospital admission 11/05/2021 with dehydration/prerenal azotemia  secondary to nausea and high output ileostomy 9.  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 Admission 05/29/2022 with dehydration and acute renal injury  Disposition: Nancy Harrison appears stable.  She continues olaparib and  every 3-week bevacizumab.  There is no clinical evidence of disease progression.  Plan to continue the same.  CBC and chemistry panel reviewed.  Labs adequate to proceed as above.  She will adjust antidiarrheal medication as needed based on stool consistency.  She understands to contact the office with poorly controlled diarrhea.  She will return for follow-up and treatment in 3 weeks.  We are available to see her sooner if needed.    Lonna Cobb ANP/GNP-BC   04/07/2023  11:23 AM

## 2023-04-08 LAB — CA 125: Cancer Antigen (CA) 125: 42.5 U/mL — ABNORMAL HIGH (ref 0.0–38.1)

## 2023-04-12 ENCOUNTER — Other Ambulatory Visit: Payer: Self-pay

## 2023-04-14 DIAGNOSIS — Z932 Ileostomy status: Secondary | ICD-10-CM | POA: Diagnosis not present

## 2023-04-20 NOTE — Telephone Encounter (Signed)
 Called to follow up with patient for medication on hand. When I last spoke with patient's husband, they indicated patient had several months worth of Lynpara. I will continue to reach out to patient until patient has confirmed quantity on hand.    Ardeen Fillers, CPhT Oncology Pharmacy Patient Advocate  [facility]  [phone] (phone) [phone] (fax) 04/20/2023 12:42 PM

## 2023-04-21 NOTE — Telephone Encounter (Signed)
 Patient returned my call. Patient confirmed they have 3 months (3 unopened bottles) of medication on hand. Reminder has been set to call patient on 07/04/23 [facility]. Closing out encounter.    Ardeen Fillers, CPhT Oncology Pharmacy Patient Advocate  [facility]  [phone] (phone) [phone] (fax) 04/21/2023 12:52 PM

## 2023-04-22 ENCOUNTER — Other Ambulatory Visit: Payer: Self-pay

## 2023-04-23 ENCOUNTER — Other Ambulatory Visit: Payer: Self-pay | Admitting: Oncology

## 2023-04-24 ENCOUNTER — Other Ambulatory Visit: Payer: Self-pay | Admitting: Oncology

## 2023-04-24 DIAGNOSIS — C481 Malignant neoplasm of specified parts of peritoneum: Secondary | ICD-10-CM

## 2023-04-25 ENCOUNTER — Encounter: Payer: Self-pay | Admitting: Oncology

## 2023-04-25 ENCOUNTER — Other Ambulatory Visit: Payer: Self-pay | Admitting: *Deleted

## 2023-04-26 ENCOUNTER — Telehealth: Payer: Self-pay | Admitting: Pharmacy Technician

## 2023-04-26 NOTE — Telephone Encounter (Signed)
 Oral Oncology Patient Advocate Encounter  Attempted to contact pt to ask about the amount of medication they still have on hand so that we know when to onboard her.   No response. LVM to call me back at my office number.  Will attempt to reach back out tomorrow.   Patty Almedia Balls, CPhT Oncology Pharmacy Patient Advocate [facility] Direct Number: [phone] Fax: [phone]

## 2023-04-27 ENCOUNTER — Other Ambulatory Visit (HOSPITAL_COMMUNITY): Payer: Self-pay

## 2023-04-27 ENCOUNTER — Other Ambulatory Visit: Payer: Self-pay | Admitting: Pharmacy Technician

## 2023-04-27 ENCOUNTER — Other Ambulatory Visit: Payer: Self-pay | Admitting: Pharmacist

## 2023-04-27 ENCOUNTER — Other Ambulatory Visit: Payer: Self-pay

## 2023-04-27 ENCOUNTER — Encounter: Payer: Self-pay | Admitting: Oncology

## 2023-04-27 DIAGNOSIS — C481 Malignant neoplasm of specified parts of peritoneum: Secondary | ICD-10-CM

## 2023-04-27 MED ORDER — OLAPARIB 150 MG PO TABS
150.0000 mg | ORAL_TABLET | Freq: Two times a day (BID) | ORAL | 2 refills | Status: DC
  Filled 2023-04-27 – 2023-04-29 (×2): qty 60, 30d supply, fill #0
  Filled 2023-08-22: qty 120, 60d supply, fill #1

## 2023-04-27 NOTE — Progress Notes (Signed)
 Specialty Pharmacy Initial Fill Coordination Note  Karaline Buresh is a 83 y.o. female contacted today regarding refills of specialty medication(s) Olaparib [facility]) .  Patient requested Delivery  on 05/02/23  to verified address [address] [place] [address]   Medication will be filled on 04/29/2023.   Patient is aware of $0 copayment.   Patty Almedia Balls, CPhT Oncology Pharmacy Patient Advocate [facility] Direct Number: [phone] Fax: [phone]

## 2023-04-27 NOTE — Telephone Encounter (Signed)
 Oral Oncology Patient Advocate Encounter  Contacted pt to ask about how much [place] she had on hand. Pt stated she has 3 full bottles and one bottle that she just opened recently.  I will be enrolling her in compass rose and scheduling her refills out to sync up with the amount of medication the pt will have left.  Patty Almedia Balls, CPhT Oncology Pharmacy Patient Advocate [facility] Direct Number: [phone] Fax: [phone]

## 2023-04-27 NOTE — Progress Notes (Signed)
 Specialty Pharmacy Initiation Note   Nancy Harrison is a 83 y.o. female who will be followed by the specialty pharmacy service for RxSp Oncology    Review of administration, indication, effectiveness, safety, potential side effects, storage/disposable, and missed dose instructions occurred today for patient's specialty medication(s) Olaparib [facility])     Patient/Caregiver did not have any additional questions or concerns.   Patient's therapy is appropriate to: Continue    Goals Addressed             This Visit's Progress    Slow Disease Progression       Patient is on track. Patient will maintain adherence        Patient switching from PAP to filling at [facility] (Specialty)   Remi Haggard Specialty Pharmacist

## 2023-04-27 NOTE — Progress Notes (Signed)
 Patient switching from PAP to filling at [facility] (Specialty)

## 2023-04-28 ENCOUNTER — Encounter: Payer: Self-pay | Admitting: Nurse Practitioner

## 2023-04-28 ENCOUNTER — Other Ambulatory Visit: Payer: Medicare HMO

## 2023-04-28 ENCOUNTER — Inpatient Hospital Stay: Payer: Medicare HMO

## 2023-04-28 ENCOUNTER — Other Ambulatory Visit: Payer: Self-pay

## 2023-04-28 ENCOUNTER — Inpatient Hospital Stay: Payer: Medicare HMO | Attending: Oncology

## 2023-04-28 ENCOUNTER — Inpatient Hospital Stay: Payer: Medicare HMO | Admitting: Nurse Practitioner

## 2023-04-28 ENCOUNTER — Ambulatory Visit: Payer: Medicare HMO | Admitting: Nurse Practitioner

## 2023-04-28 ENCOUNTER — Ambulatory Visit: Payer: Medicare HMO

## 2023-04-28 VITALS — BP 130/73 | HR 78 | Temp 98.2°F | Resp 18 | Ht 59.0 in | Wt 123.6 lb

## 2023-04-28 VITALS — BP 135/82 | HR 85 | Temp 97.7°F | Resp 16

## 2023-04-28 DIAGNOSIS — C481 Malignant neoplasm of specified parts of peritoneum: Secondary | ICD-10-CM | POA: Insufficient documentation

## 2023-04-28 DIAGNOSIS — Z5112 Encounter for antineoplastic immunotherapy: Secondary | ICD-10-CM | POA: Diagnosis present

## 2023-04-28 LAB — CMP (CANCER CENTER ONLY)
ALT: 12 U/L (ref 0–44)
AST: 20 U/L (ref 15–41)
Albumin: 4 g/dL (ref 3.5–5.0)
Alkaline Phosphatase: 44 U/L (ref 38–126)
Anion gap: 7 (ref 5–15)
BUN: 22 mg/dL (ref 8–23)
CO2: 29 mmol/L (ref 22–32)
Calcium: 9.3 mg/dL (ref 8.9–10.3)
Chloride: 102 mmol/L (ref 98–111)
Creatinine: 0.86 mg/dL (ref 0.44–1.00)
GFR, Estimated: >60 mL/min (ref >60)
Glucose, Bld: 97 mg/dL (ref 70–99)
Potassium: 4.4 mmol/L (ref 3.5–5.1)
Sodium: 138 mmol/L (ref 135–145)
Total Bilirubin: 0.7 mg/dL (ref 0.0–1.2)
Total Protein: 7 g/dL (ref 6.5–8.1)

## 2023-04-28 LAB — CBC WITH DIFFERENTIAL (CANCER CENTER ONLY)
Abs Immature Granulocytes: 0.01 10*3/uL (ref 0.00–0.07)
Basophils Absolute: 0 10*3/uL (ref 0.0–0.1)
Basophils Relative: 1 %
Eosinophils Absolute: 0.3 10*3/uL (ref 0.0–0.5)
Eosinophils Relative: 6 %
HCT: 39.4 % (ref 36.0–46.0)
Hemoglobin: 13.1 g/dL (ref 12.0–15.0)
Immature Granulocytes: 0 %
Lymphocytes Relative: 18 %
Lymphs Abs: 1 10*3/uL (ref 0.7–4.0)
MCH: 35.8 pg — ABNORMAL HIGH (ref 26.0–34.0)
MCHC: 33.2 g/dL (ref 30.0–36.0)
MCV: 107.7 fL — ABNORMAL HIGH (ref 80.0–100.0)
Monocytes Absolute: 0.6 10*3/uL (ref 0.1–1.0)
Monocytes Relative: 10 %
Neutro Abs: 3.7 10*3/uL (ref 1.7–7.7)
Neutrophils Relative %: 65 %
Platelet Count: 208 10*3/uL (ref 150–400)
RBC: 3.66 MIL/uL — ABNORMAL LOW (ref 3.87–5.11)
RDW: 14.3 % (ref 11.5–15.5)
WBC Count: 5.6 10*3/uL (ref 4.0–10.5)
nRBC: 0 % (ref 0.0–0.2)

## 2023-04-28 LAB — TOTAL PROTEIN, URINE DIPSTICK

## 2023-04-28 LAB — MAGNESIUM: Magnesium: 2.1 mg/dL (ref 1.7–2.4)

## 2023-04-28 MED ORDER — SODIUM CHLORIDE 0.9 % IV SOLN
15.0000 mg/kg | Freq: Once | INTRAVENOUS | Status: AC
  Administered 2023-04-28: 800 mg via INTRAVENOUS
  Filled 2023-04-28: qty 32

## 2023-04-28 MED ORDER — HEPARIN SOD (PORK) LOCK FLUSH 100 UNIT/ML IV SOLN
500.0000 [IU] | Freq: Once | INTRAVENOUS | Status: AC | PRN
Start: 2023-04-28 — End: 2023-04-28
  Administered 2023-04-28: 500 [IU]

## 2023-04-28 MED ORDER — SODIUM CHLORIDE 0.9 % IV SOLN: Freq: Once | INTRAVENOUS | Status: AC

## 2023-04-28 MED ORDER — SODIUM CHLORIDE 0.9% FLUSH
10.0000 mL | INTRAVENOUS | Status: DC | PRN
  Administered 2023-04-28: 10 mL

## 2023-04-28 NOTE — Progress Notes (Signed)
 [facility] OFFICE PROGRESS NOTE   Diagnosis: Peritoneal carcinoma  INTERVAL HISTORY:   Nancy Harrison returns as scheduled.  She continues olaparib.  She completed another cycle of bevacizumab 04/07/2023.  She denies nausea/vomiting.  No mouth sores.  She notes a tooth at the right lower posterior gumline is "sore".  No rash.  She has increased the dose of Lomotil and notes stools are thicker.  She denies bleeding.  No abdominal pain.  Recent bilateral hip pain which she attributes to arthritis.  The pain is better today.  Objective:  Vital signs in last 24 hours:  Blood pressure 130/73, pulse 78, temperature 98.2 F (36.8 C), temperature source Temporal, resp. rate 18, height 4\' 11"  (1.499 m), weight 123 lb 9.6 oz (56.1 kg), SpO2 95%.    HEENT: No thrush or ulcers.  Right lower posterior gum is nontender, no erythema. Resp: Lungs clear bilaterally. Cardio: Regular rate and rhythm. GI: Abdomen soft and nontender.  No hepatosplenomegaly.  Right abdomen ostomy with partially formed stool in the collection bag. Vascular: No leg edema. Skin: No rash. Portacath without erythema.  Lab Results:  Lab Results  Component Value Date   WBC 5.6 04/28/2023   HGB 13.1 04/28/2023   HCT 39.4 04/28/2023   MCV 107.7 (H) 04/28/2023   PLT 208 04/28/2023   NEUTROABS 3.7 04/28/2023    Imaging:  No results found.  Medications: I have reviewed the patient's current medications.  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  Bone lesions concerning for metastases, right lung nodule Laparoscopy, diverting loop ileostomy, gastrostomy tube placement, peritoneal biopsy 10/16/2021, no evidence of a  primary tumor site at the appendix, ovaries, or uterus.  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  CDX2, CK20, and GATA3 negative.  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 Foundation 1-MSS, tumor mutation burden 5, HRD positive-LOH score 34.1% 08/11/2021 CA125 818 CT chest 11/03/2021-10 mm right lower lobe nodule, scattered tiny pulmonary nodules, peritoneal carcinomatosis Cycle 1 Taxol/carboplatin 11/12/2021 Cycle 2 Taxol/carboplatin 12/03/2021 Cycle 3 Taxol/carboplatin 12/24/2021 Bone scan 01/05/2022-negative for metastatic disease Cycle 4 Taxol/carboplatin 01/13/2022 CTs 01/22/2022-reduction in peritoneal carcinomatosis, no evidence of progressive disease stable bilateral lower lobe pulmonary nodules Cycle 5 Taxol/carboplatin 02/03/2022 Cycle 6 Taxol/carboplatin 02/24/2022 CT abdomen/pelvis 04/19/2022-slight decrease in peritoneal carcinomatosis with residual lesions 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   Abdominal distention/pain and diarrhea secondary to #1 3.   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  Lumpectomy and adjuvant CMF x8 followed by radiation 4. Hypertension 5. Hypothyroidism 6. Barrett's  esophagus 7. Family history of multiple cancers including appendix, colon, and lung cancer 8.  Hospital admission 11/05/2021 with dehydration/prerenal azotemia secondary to nausea and high output ileostomy 9.  Genetic testing-heterozygote for pathogenic  mutations in the MUTYH and CF genes 10. Admission 05/29/2022 with dehydration and acute renal injury    Disposition: Nancy Harrison appears stable.  She continues olaparib and every 3-week bevacizumab.  She is tolerating treatment well.  There is no clinical evidence of disease progression.  CA125 with mild stable elevation 03/17/2023 and 04/07/2023.  We will follow-up on the value from today.  CBC and chemistry panel reviewed.  Labs adequate to proceed as above.  She will contact her dentist for evaluation of the tooth pain.  She understands to make sure they are aware she is on Avastin.  She will return for follow-up and bevacizumab in 3 weeks.    [facility] ANP/GNP-BC   04/28/2023  12:02 PM

## 2023-04-28 NOTE — Progress Notes (Signed)
 Patient seen by Lonna Cobb NP today  Vitals are within treatment parameters:Yes   Labs are within treatment parameters: Yes trace of protein in urine  Treatment plan has been signed: Yes   Per physician team, Patient is ready for treatment and there are NO modifications to the treatment plan.

## 2023-04-28 NOTE — Patient Instructions (Signed)
 [facility]  Discharge Instructions: Thank you for choosing [facility] to provide your oncology and hematology care.   If you have a lab appointment with the Cancer Center, please go directly to the Cancer Center and check in at the registration area.   Wear comfortable clothing and clothing appropriate for easy access to any Portacath or PICC line.   We strive to give you quality time with your provider. You may need to reschedule your appointment if you arrive late (15 or more minutes).  Arriving late affects you and other patients whose appointments are after yours.  Also, if you miss three or more appointments without notifying the office, you may be dismissed from the clinic at the provider's discretion.      For prescription refill requests, have your pharmacy contact our office and allow 72 hours for refills to be completed.    Today you received the following chemotherapy and/or immunotherapy agents Bevacizumab.  Bevacizumab Injection What is this medication? BEVACIZUMAB (be va SIZ yoo mab) treats some types of cancer. It works by blocking a protein that causes cancer cells to grow and multiply. This helps to slow or stop the spread of cancer cells. It is a monoclonal antibody. This medicine may be used for other purposes; ask your health care provider or pharmacist if you have questions. COMMON BRAND NAME(S): Alymsys, Avastin, MVASI, Rosaland Lao What should I tell my care team before I take this medication? They need to know if you have any of these conditions: Blood clots Coughing up blood Having or recent surgery Heart failure High blood pressure History of a connection between 2 or more body parts that do not usually connect (fistula) History of a tear in your stomach or intestines Protein in your urine An unusual or allergic reaction to bevacizumab, other medications, foods, dyes, or  preservatives Pregnant or trying to get pregnant Breast-feeding How should I use this medication? This medication is injected into a vein. It is given by your care team in a hospital or clinic setting. Talk to your care team the use of this medication in children. Special care may be needed. Overdosage: If you think you have taken too much of this medicine contact a poison control center or emergency room at once. NOTE: This medicine is only for you. Do not share this medicine with others. What if I miss a dose? Keep appointments for follow-up doses. It is important not to miss your dose. Call your care team if you are unable to keep an appointment. What may interact with this medication? Interactions are not expected. This list may not describe all possible interactions. Give your health care provider a list of all the medicines, herbs, non-prescription drugs, or dietary supplements you use. Also tell them if you smoke, drink alcohol, or use illegal drugs. Some items may interact with your medicine. What should I watch for while using this medication? Your condition will be monitored carefully while you are receiving this medication. You may need blood work while taking this medication. This medication may make you feel generally unwell. This is not uncommon as chemotherapy can affect healthy cells as well as cancer cells. Report any side effects. Continue your course of treatment even though you feel ill unless your care team tells you to stop. This medication may increase your risk to bruise or bleed. Call your care team if you notice any unusual bleeding. Before having  surgery, talk to your care team to make sure it is ok. This medication can increase the risk of poor healing of your surgical site or wound. You will need to stop this medication for 28 days before surgery. After surgery, wait at least 28 days before restarting this medication. Make sure the surgical site or wound is healed enough  before restarting this medication. Talk to your care team if questions. Talk to your care team if you may be pregnant. Serious birth defects can occur if you take this medication during pregnancy and for 6 months after the last dose. Contraception is recommended while taking this medication and for 6 months after the last dose. Your care team can help you find the option that works for you. Do not breastfeed while taking this medication and for 6 months after the last dose. This medication can cause infertility. Talk to your care team if you are concerned about your fertility. What side effects may I notice from receiving this medication? Side effects that you should report to your care team as soon as possible: Allergic reactions--skin rash, itching, hives, swelling of the face, lips, tongue, or throat Bleeding--bloody or black, tar-like stools, vomiting blood or brown material that looks like coffee grounds, red or dark brown urine, small red or purple spots on skin, unusual bruising or bleeding Blood clot--pain, swelling, or warmth in the leg, shortness of breath, chest pain Heart attack--pain or tightness in the chest, shoulders, arms, or jaw, nausea, shortness of breath, cold or clammy skin, feeling faint or lightheaded Heart failure--shortness of breath, swelling of the ankles, feet, or hands, sudden weight gain, unusual weakness or fatigue Increase in blood pressure Infection--fever, chills, cough, sore throat, wounds that don't heal, pain or trouble when passing urine, general feeling of discomfort or being unwell Infusion reactions--chest pain, shortness of breath or trouble breathing, feeling faint or lightheaded Kidney injury--decrease in the amount of urine, swelling of the ankles, hands, or feet Stomach pain that is severe, does not go away, or gets worse Stroke--sudden numbness or weakness of the face, arm, or leg, trouble speaking, confusion, trouble walking, loss of balance or  coordination, dizziness, severe headache, change in vision Sudden and severe headache, confusion, change in vision, seizures, which may be signs of posterior reversible encephalopathy syndrome (PRES) Side effects that usually do not require medical attention (report to your care team if they continue or are bothersome): Back pain Change in taste Diarrhea Dry skin Increased tears Nosebleed This list may not describe all possible side effects. Call your doctor for medical advice about side effects. You may report side effects to FDA at 1-800-FDA-1088. Where should I keep my medication? This medication is given in a hospital or clinic. It will not be stored at home. NOTE: This sheet is a summary. It may not cover all possible information. If you have questions about this medicine, talk to your doctor, pharmacist, or health care provider.  2024 Elsevier/Gold Standard (2021-06-26 00:00:00)      To help prevent nausea and vomiting after your treatment, we encourage you to take your nausea medication as directed.  BELOW ARE SYMPTOMS THAT SHOULD BE REPORTED IMMEDIATELY: *FEVER GREATER THAN 100.4 F (38 C) OR HIGHER *CHILLS OR SWEATING *NAUSEA AND VOMITING THAT IS NOT CONTROLLED WITH YOUR NAUSEA MEDICATION *UNUSUAL SHORTNESS OF BREATH *UNUSUAL BRUISING OR BLEEDING *URINARY PROBLEMS (pain or burning when urinating, or frequent urination) *BOWEL PROBLEMS (unusual diarrhea, constipation, pain near the anus) TENDERNESS IN MOUTH AND THROAT WITH OR  WITHOUT PRESENCE OF ULCERS (sore throat, sores in mouth, or a toothache) UNUSUAL RASH, SWELLING OR PAIN  UNUSUAL VAGINAL DISCHARGE OR ITCHING   Items with * indicate a potential emergency and should be followed up as soon as possible or go to the Emergency Department if any problems should occur.  Please show the CHEMOTHERAPY ALERT CARD or IMMUNOTHERAPY ALERT CARD at check-in to the Emergency Department and triage nurse.  Should you have questions after  your visit or need to cancel or reschedule your appointment, please contact [facility]  Dept: [phone]  and follow the prompts.  Office hours are 8:00 a.m. to 4:30 p.m. Monday - Friday. Please note that voicemails left after 4:00 p.m. may not be returned until the following business day.  We are closed weekends and major holidays. You have access to a nurse at all times for urgent questions. Please call the main number to the clinic Dept: [phone] and follow the prompts.   For any non-urgent questions, you may also contact your provider using MyChart. We now offer e-Visits for anyone 43 and older to request care online for non-urgent symptoms. For details visit [email or web address].   Also download the MyChart app! Go to the app store, search "MyChart", open the app, select [place], and log in with your MyChart username and password.

## 2023-04-28 NOTE — Progress Notes (Signed)
 Patient presents today for chemotherapy infusion. Patient is in satisfactory condition with no new complaints voiced.  Vital signs are stable.  Labs reviewed by Lonna Cobb NP during the office visit and all labs are within treatment parameters, with exception of a trace of protein in urine. Per Lonna Cobb NP okay to treat. We will proceed with treatment per MD orders.   Patient tolerated treatment well with no complaints voiced.  Patient left ambulatory in stable condition.  Vital signs stable at discharge.  Follow up as scheduled.

## 2023-04-28 NOTE — Progress Notes (Signed)
 Patients port flushed without difficulty.  Good blood return noted with no bruising or swelling noted at site.  Patient remains accessed for treatment.

## 2023-04-29 ENCOUNTER — Other Ambulatory Visit: Payer: Self-pay

## 2023-04-29 LAB — CA 125: Cancer Antigen (CA) 125: 50.9 U/mL — ABNORMAL HIGH (ref 0.0–38.1)

## 2023-04-29 NOTE — Progress Notes (Signed)
 Called and spoke with patient to update delivery date. Per manufacturer requirements, Nancy Harrison should be dispensed in original bottle. Nancy Harrison order written for #60 and pharmacy will need to order 60 count bottle today for Monday. Per inventory technician, should not be an issue ordering [name]. Notified [facility] of update. New shipping date will be 05/02/23.   Ship 3/10. Confirmed Address. Patient aware of new delivery date.   Patient reports she has enough medication on hand.

## 2023-05-02 ENCOUNTER — Other Ambulatory Visit: Payer: Self-pay

## 2023-05-04 ENCOUNTER — Other Ambulatory Visit: Payer: Self-pay

## 2023-05-15 ENCOUNTER — Other Ambulatory Visit: Payer: Self-pay | Admitting: Oncology

## 2023-05-16 ENCOUNTER — Other Ambulatory Visit: Payer: Self-pay

## 2023-05-16 DIAGNOSIS — Z932 Ileostomy status: Secondary | ICD-10-CM | POA: Diagnosis not present

## 2023-05-19 ENCOUNTER — Inpatient Hospital Stay: Payer: Medicare HMO

## 2023-05-19 ENCOUNTER — Inpatient Hospital Stay (HOSPITAL_BASED_OUTPATIENT_CLINIC_OR_DEPARTMENT_OTHER): Payer: Medicare HMO | Admitting: Nurse Practitioner

## 2023-05-19 ENCOUNTER — Encounter: Payer: Self-pay | Admitting: Nurse Practitioner

## 2023-05-19 VITALS — BP 126/73 | HR 96 | Temp 97.6°F | Resp 16

## 2023-05-19 VITALS — BP 139/81 | HR 100 | Temp 98.1°F | Resp 18 | Ht 59.0 in | Wt 119.8 lb

## 2023-05-19 DIAGNOSIS — C481 Malignant neoplasm of specified parts of peritoneum: Secondary | ICD-10-CM | POA: Diagnosis not present

## 2023-05-19 DIAGNOSIS — Z95828 Presence of other vascular implants and grafts: Secondary | ICD-10-CM

## 2023-05-19 DIAGNOSIS — Z5112 Encounter for antineoplastic immunotherapy: Secondary | ICD-10-CM | POA: Diagnosis not present

## 2023-05-19 LAB — CBC WITH DIFFERENTIAL (CANCER CENTER ONLY)
Abs Immature Granulocytes: 0.03 10*3/uL (ref 0.00–0.07)
Basophils Absolute: 0.1 10*3/uL (ref 0.0–0.1)
Basophils Relative: 1 %
Eosinophils Absolute: 0.3 10*3/uL (ref 0.0–0.5)
Eosinophils Relative: 4 %
HCT: 42.5 % (ref 36.0–46.0)
Hemoglobin: 14.2 g/dL (ref 12.0–15.0)
Immature Granulocytes: 1 %
Lymphocytes Relative: 14 %
Lymphs Abs: 0.9 10*3/uL (ref 0.7–4.0)
MCH: 35.7 pg — ABNORMAL HIGH (ref 26.0–34.0)
MCHC: 33.4 g/dL (ref 30.0–36.0)
MCV: 106.8 fL — ABNORMAL HIGH (ref 80.0–100.0)
Monocytes Absolute: 0.5 10*3/uL (ref 0.1–1.0)
Monocytes Relative: 7 %
Neutro Abs: 4.9 10*3/uL (ref 1.7–7.7)
Neutrophils Relative %: 73 %
Platelet Count: 233 10*3/uL (ref 150–400)
RBC: 3.98 MIL/uL (ref 3.87–5.11)
RDW: 13.8 % (ref 11.5–15.5)
WBC Count: 6.6 10*3/uL (ref 4.0–10.5)
nRBC: 0 % (ref 0.0–0.2)

## 2023-05-19 LAB — CMP (CANCER CENTER ONLY)
ALT: 12 U/L (ref 0–44)
AST: 23 U/L (ref 15–41)
Albumin: 4.3 g/dL (ref 3.5–5.0)
Alkaline Phosphatase: 49 U/L (ref 38–126)
Anion gap: 9 (ref 5–15)
BUN: 27 mg/dL — ABNORMAL HIGH (ref 8–23)
CO2: 25 mmol/L (ref 22–32)
Calcium: 9.9 mg/dL (ref 8.9–10.3)
Chloride: 103 mmol/L (ref 98–111)
Creatinine: 0.8 mg/dL (ref 0.44–1.00)
GFR, Estimated: >60 mL/min (ref >60)
Glucose, Bld: 125 mg/dL — ABNORMAL HIGH (ref 70–99)
Potassium: 4.1 mmol/L (ref 3.5–5.1)
Sodium: 137 mmol/L (ref 135–145)
Total Bilirubin: 0.8 mg/dL (ref 0.0–1.2)
Total Protein: 7.6 g/dL (ref 6.5–8.1)

## 2023-05-19 LAB — TOTAL PROTEIN, URINE DIPSTICK

## 2023-05-19 MED ORDER — SODIUM CHLORIDE 0.9 % IV SOLN
15.0000 mg/kg | Freq: Once | INTRAVENOUS | Status: AC
  Administered 2023-05-19: 800 mg via INTRAVENOUS
  Filled 2023-05-19: qty 32

## 2023-05-19 MED ORDER — SODIUM CHLORIDE 0.9% FLUSH
10.0000 mL | INTRAVENOUS | Status: DC | PRN
  Administered 2023-05-19: 10 mL

## 2023-05-19 MED ORDER — SODIUM CHLORIDE 0.9% FLUSH
10.0000 mL | Freq: Once | INTRAVENOUS | Status: AC
  Administered 2023-05-19: 10 mL via INTRAVENOUS

## 2023-05-19 MED ORDER — HEPARIN SOD (PORK) LOCK FLUSH 100 UNIT/ML IV SOLN
500.0000 [IU] | Freq: Once | INTRAVENOUS | Status: AC | PRN
  Administered 2023-05-19: 500 [IU]

## 2023-05-19 MED ORDER — SODIUM CHLORIDE 0.9 % IV SOLN: Freq: Once | INTRAVENOUS | Status: AC

## 2023-05-19 NOTE — Patient Instructions (Signed)
 [facility]  Discharge Instructions: Thank you for choosing [facility] to provide your oncology and hematology care.   If you have a lab appointment with the Cancer Center, please go directly to the Cancer Center and check in at the registration area.   Wear comfortable clothing and clothing appropriate for easy access to any Portacath or PICC line.   We strive to give you quality time with your provider. You may need to reschedule your appointment if you arrive late (15 or more minutes).  Arriving late affects you and other patients whose appointments are after yours.  Also, if you miss three or more appointments without notifying the office, you may be dismissed from the clinic at the provider's discretion.      For prescription refill requests, have your pharmacy contact our office and allow 72 hours for refills to be completed.    Today you received the following chemotherapy and/or immunotherapy agent: Bevacizumab       To help prevent nausea and vomiting after your treatment, we encourage you to take your nausea medication as directed.  BELOW ARE SYMPTOMS THAT SHOULD BE REPORTED IMMEDIATELY: *FEVER GREATER THAN 100.4 F (38 C) OR HIGHER *CHILLS OR SWEATING *NAUSEA AND VOMITING THAT IS NOT CONTROLLED WITH YOUR NAUSEA MEDICATION *UNUSUAL SHORTNESS OF BREATH *UNUSUAL BRUISING OR BLEEDING *URINARY PROBLEMS (pain or burning when urinating, or frequent urination) *BOWEL PROBLEMS (unusual diarrhea, constipation, pain near the anus) TENDERNESS IN MOUTH AND THROAT WITH OR WITHOUT PRESENCE OF ULCERS (sore throat, sores in mouth, or a toothache) UNUSUAL RASH, SWELLING OR PAIN  UNUSUAL VAGINAL DISCHARGE OR ITCHING   Items with * indicate a potential emergency and should be followed up as soon as possible or go to the Emergency Department if any problems should occur.  Please show the CHEMOTHERAPY ALERT CARD or  IMMUNOTHERAPY ALERT CARD at check-in to the Emergency Department and triage nurse.  Should you have questions after your visit or need to cancel or reschedule your appointment, please contact [facility]  Dept: [phone]  and follow the prompts.  Office hours are 8:00 a.m. to 4:30 p.m. Monday - Friday. Please note that voicemails left after 4:00 p.m. may not be returned until the following business day.  We are closed weekends and major holidays. You have access to a nurse at all times for urgent questions. Please call the main number to the clinic Dept: [phone] and follow the prompts.   For any non-urgent questions, you may also contact your provider using MyChart. We now offer e-Visits for anyone 22 and older to request care online for non-urgent symptoms. For details visit [email or web address].   Also download the MyChart app! Go to the app store, search "MyChart", open the app, select [place], and log in with your MyChart username and password.  Bevacizumab Injection What is this medication? BEVACIZUMAB (be va SIZ yoo mab) treats some types of cancer. It works by blocking a protein that causes cancer cells to grow and multiply. This helps to slow or stop the spread of cancer cells. It is a monoclonal antibody. This medicine may be used for other purposes; ask your health care provider or pharmacist if you have questions. COMMON BRAND NAME(S): Alymsys, Avastin, MVASI, Rosaland Lao What should I tell my care team before I take this medication? They need to know if you have any of  these conditions: Blood clots Coughing up blood Having or recent surgery Heart failure High blood pressure History of a connection between 2 or more body parts that do not usually connect (fistula) History of a tear in your stomach or intestines Protein in your urine An unusual or allergic reaction to bevacizumab, other medications, foods,  dyes, or preservatives Pregnant or trying to get pregnant Breast-feeding How should I use this medication? This medication is injected into a vein. It is given by your care team in a hospital or clinic setting. Talk to your care team the use of this medication in children. Special care may be needed. Overdosage: If you think you have taken too much of this medicine contact a poison control center or emergency room at once. NOTE: This medicine is only for you. Do not share this medicine with others. What if I miss a dose? Keep appointments for follow-up doses. It is important not to miss your dose. Call your care team if you are unable to keep an appointment. What may interact with this medication? Interactions are not expected. This list may not describe all possible interactions. Give your health care provider a list of all the medicines, herbs, non-prescription drugs, or dietary supplements you use. Also tell them if you smoke, drink alcohol, or use illegal drugs. Some items may interact with your medicine. What should I watch for while using this medication? Your condition will be monitored carefully while you are receiving this medication. You may need blood work while taking this medication. This medication may make you feel generally unwell. This is not uncommon as chemotherapy can affect healthy cells as well as cancer cells. Report any side effects. Continue your course of treatment even though you feel ill unless your care team tells you to stop. This medication may increase your risk to bruise or bleed. Call your care team if you notice any unusual bleeding. Before having surgery, talk to your care team to make sure it is ok. This medication can increase the risk of poor healing of your surgical site or wound. You will need to stop this medication for 28 days before surgery. After surgery, wait at least 28 days before restarting this medication. Make sure the surgical site or wound is  healed enough before restarting this medication. Talk to your care team if questions. Talk to your care team if you may be pregnant. Serious birth defects can occur if you take this medication during pregnancy and for 6 months after the last dose. Contraception is recommended while taking this medication and for 6 months after the last dose. Your care team can help you find the option that works for you. Do not breastfeed while taking this medication and for 6 months after the last dose. This medication can cause infertility. Talk to your care team if you are concerned about your fertility. What side effects may I notice from receiving this medication? Side effects that you should report to your care team as soon as possible: Allergic reactions--skin rash, itching, hives, swelling of the face, lips, tongue, or throat Bleeding--bloody or black, tar-like stools, vomiting blood or brown material that looks like coffee grounds, red or dark brown urine, small red or purple spots on skin, unusual bruising or bleeding Blood clot--pain, swelling, or warmth in the leg, shortness of breath, chest pain Heart attack--pain or tightness in the chest, shoulders, arms, or jaw, nausea, shortness of breath, cold or clammy skin, feeling faint or lightheaded Heart failure--shortness of breath, swelling  of the ankles, feet, or hands, sudden weight gain, unusual weakness or fatigue Increase in blood pressure Infection--fever, chills, cough, sore throat, wounds that don't heal, pain or trouble when passing urine, general feeling of discomfort or being unwell Infusion reactions--chest pain, shortness of breath or trouble breathing, feeling faint or lightheaded Kidney injury--decrease in the amount of urine, swelling of the ankles, hands, or feet Stomach pain that is severe, does not go away, or gets worse Stroke--sudden numbness or weakness of the face, arm, or leg, trouble speaking, confusion, trouble walking, loss of  balance or coordination, dizziness, severe headache, change in vision Sudden and severe headache, confusion, change in vision, seizures, which may be signs of posterior reversible encephalopathy syndrome (PRES) Side effects that usually do not require medical attention (report to your care team if they continue or are bothersome): Back pain Change in taste Diarrhea Dry skin Increased tears Nosebleed This list may not describe all possible side effects. Call your doctor for medical advice about side effects. You may report side effects to FDA at 1-800-FDA-1088. Where should I keep my medication? This medication is given in a hospital or clinic. It will not be stored at home. NOTE: This sheet is a summary. It may not cover all possible information. If you have questions about this medicine, talk to your doctor, pharmacist, or health care provider.  2024 Elsevier/Gold Standard (2021-06-26 00:00:00)

## 2023-05-19 NOTE — Progress Notes (Signed)
 [facility] OFFICE PROGRESS NOTE   Diagnosis: Peritoneal carcinoma  INTERVAL HISTORY:   Nancy Harrison returns as scheduled.  She continues olaparib.  She completed another cycle of bevacizumab 04/28/2023.  She denies nausea/vomiting.  No mouth sores.  No diarrhea.  Oral intake described as "fair".  No abdominal pain.  No bleeding.  No symptom of thrombosis.  Objective:  Vital signs in last 24 hours:  Blood pressure 139/81, pulse 100, temperature 98.1 F (36.7 C), temperature source Temporal, resp. rate 18, height 4\' 11"  (1.499 m), weight 119 lb 12.8 oz (54.3 kg), SpO2 96%.    HEENT: No thrush or ulcers. Resp: Lungs clear bilaterally. Cardio: Regular rate and rhythm. GI: Abdomen soft and nontender.  No hepatosplenomegaly.  Right abdomen ostomy. Vascular: No leg edema. Skin: No rash. Port-A-Cath without erythema.  Lab Results:  Lab Results  Component Value Date   WBC 6.6 05/19/2023   HGB 14.2 05/19/2023   HCT 42.5 05/19/2023   MCV 106.8 (H) 05/19/2023   PLT 233 05/19/2023   NEUTROABS 4.9 05/19/2023    Imaging:  No results found.  Medications: I have reviewed the patient's current medications.  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  Bone lesions concerning for metastases, right lung nodule Laparoscopy, diverting loop ileostomy, gastrostomy tube placement, peritoneal biopsy 10/16/2021, no evidence of a primary tumor site at the appendix, ovaries, or uterus.  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   CDX2, CK20, and GATA3 negative.  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 Foundation 1-MSS, tumor mutation burden 5, HRD positive-LOH score 34.1% 08/11/2021 CA125 818 CT chest 11/03/2021-10 mm right lower lobe nodule, scattered tiny pulmonary nodules, peritoneal carcinomatosis Cycle 1 Taxol/carboplatin 11/12/2021 Cycle 2 Taxol/carboplatin 12/03/2021 Cycle 3 Taxol/carboplatin 12/24/2021 Bone scan 01/05/2022-negative for metastatic disease Cycle 4 Taxol/carboplatin 01/13/2022 CTs 01/22/2022-reduction in peritoneal carcinomatosis, no evidence of progressive disease stable bilateral lower lobe pulmonary nodules Cycle 5 Taxol/carboplatin 02/03/2022 Cycle 6 Taxol/carboplatin 02/24/2022 CT abdomen/pelvis 04/19/2022-slight decrease in peritoneal carcinomatosis with residual lesions 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   Abdominal distention/pain and diarrhea secondary to #1 3.   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  Lumpectomy and adjuvant CMF x8 followed by radiation 4. Hypertension 5. Hypothyroidism 6. Barrett's esophagus 7. Family history of multiple cancers including appendix, colon, and lung cancer 8.  Hospital admission 11/05/2021 with dehydration/prerenal azotemia secondary to nausea and high output ileostomy 9.  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 Admission 05/29/2022 with dehydration and acute renal injury    Disposition: Nancy Harrison appears stable.  She continues olaparib and every 3-week bevacizumab.  She is tolerating treatment well.  There is no clinical evidence of disease progression.  CA125  seems to be trending upward.  We will follow-up on the value from today.  Plan to continue current treatment with olaparib and every 3-week bevacizumab.  CBC and chemistry panel reviewed.  Labs adequate to proceed as above.  She will return for follow-up and treatment in 3 weeks.  We are available to see her sooner if needed.  Nancy Harrison ANP/GNP-BC   05/19/2023  11:07 AM

## 2023-05-19 NOTE — Progress Notes (Signed)
 Patient seen by Lonna Cobb NP today  Vitals are within treatment parameters:Yes  Decreased 4 pounds Okay to proceed Labs are within treatment parameters: Yes   Treatment plan has been signed: Yes   Per physician team, Patient is ready for treatment and there are NO modifications to the treatment plan.

## 2023-05-20 ENCOUNTER — Other Ambulatory Visit: Payer: Self-pay

## 2023-05-20 LAB — CA 125: Cancer Antigen (CA) 125: 56.3 U/mL — ABNORMAL HIGH (ref 0.0–38.1)

## 2023-05-22 ENCOUNTER — Other Ambulatory Visit: Payer: Self-pay | Admitting: Oncology

## 2023-05-23 ENCOUNTER — Encounter: Payer: Self-pay | Admitting: Oncology

## 2023-05-23 ENCOUNTER — Other Ambulatory Visit: Payer: Self-pay | Admitting: *Deleted

## 2023-05-25 ENCOUNTER — Telehealth: Payer: Self-pay | Admitting: Nutrition

## 2023-05-25 NOTE — Telephone Encounter (Signed)
 Called to schedule follow up nutrition appt per inbasket. LVM to return call for scheduling.

## 2023-06-08 ENCOUNTER — Other Ambulatory Visit: Payer: Self-pay | Admitting: Oncology

## 2023-06-08 ENCOUNTER — Inpatient Hospital Stay: Attending: Oncology | Admitting: Nutrition

## 2023-06-08 DIAGNOSIS — Z5112 Encounter for antineoplastic immunotherapy: Secondary | ICD-10-CM | POA: Insufficient documentation

## 2023-06-08 DIAGNOSIS — C481 Malignant neoplasm of specified parts of peritoneum: Secondary | ICD-10-CM | POA: Insufficient documentation

## 2023-06-08 NOTE — Progress Notes (Signed)
 Patient identified to be at risk for malnutrition on the MST due to weight loss and poor appetite.  Telephone follow up completed with patient. 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 Scherrie Curt.  Weight documented as 119 pounds 12.8 oz March 27. This is decreased from 122 pounds 12.8 oz Jan 2. This is a 2% decrease and not clinically significant.   Labs noted: Glucose 125 and BUN 27.  Patient thinks she is eating ok. She tries to eat protein foods at meals and snacks. States she monitors her weight and it is always within 2-3 pounds. Denies nausea, vomiting and diarrhea. No mouth sores. Her ostomy is functioning fine. She denies needs at this time.  Nutrition Diagnosis: Unintentional weight loss continues  Intervention: Educated to continue small, frequent meals and snacks with high protein foods and adequate calories for weight maintenance. Suggested she could add CIB as a snack to provide additional calories and protein. Provided name and phone number for questions.  Monitoring, Evaluation, Goals: Tolerate adequate protein and calories for weight maintenance.  Next Visit: To be scheduled as needed.

## 2023-06-09 ENCOUNTER — Inpatient Hospital Stay

## 2023-06-09 ENCOUNTER — Inpatient Hospital Stay: Admitting: Oncology

## 2023-06-09 VITALS — BP 129/71 | HR 97 | Temp 97.9°F | Resp 18 | Wt 121.0 lb

## 2023-06-09 VITALS — BP 147/81 | HR 82

## 2023-06-09 DIAGNOSIS — Z5112 Encounter for antineoplastic immunotherapy: Secondary | ICD-10-CM | POA: Diagnosis present

## 2023-06-09 DIAGNOSIS — Z95828 Presence of other vascular implants and grafts: Secondary | ICD-10-CM

## 2023-06-09 DIAGNOSIS — C481 Malignant neoplasm of specified parts of peritoneum: Secondary | ICD-10-CM

## 2023-06-09 LAB — CBC WITH DIFFERENTIAL (CANCER CENTER ONLY)
Abs Immature Granulocytes: 0.01 10*3/uL (ref 0.00–0.07)
Basophils Absolute: 0.1 10*3/uL (ref 0.0–0.1)
Basophils Relative: 1 %
Eosinophils Absolute: 0.2 10*3/uL (ref 0.0–0.5)
Eosinophils Relative: 5 %
HCT: 39.2 % (ref 36.0–46.0)
Hemoglobin: 13.1 g/dL (ref 12.0–15.0)
Immature Granulocytes: 0 %
Lymphocytes Relative: 21 %
Lymphs Abs: 1 10*3/uL (ref 0.7–4.0)
MCH: 36 pg — ABNORMAL HIGH (ref 26.0–34.0)
MCHC: 33.4 g/dL (ref 30.0–36.0)
MCV: 107.7 fL — ABNORMAL HIGH (ref 80.0–100.0)
Monocytes Absolute: 0.5 10*3/uL (ref 0.1–1.0)
Monocytes Relative: 10 %
Neutro Abs: 3 10*3/uL (ref 1.7–7.7)
Neutrophils Relative %: 63 %
Platelet Count: 214 10*3/uL (ref 150–400)
RBC: 3.64 MIL/uL — ABNORMAL LOW (ref 3.87–5.11)
RDW: 14.5 % (ref 11.5–15.5)
WBC Count: 4.8 10*3/uL (ref 4.0–10.5)
nRBC: 0 % (ref 0.0–0.2)

## 2023-06-09 LAB — CMP (CANCER CENTER ONLY)
ALT: 11 U/L (ref 0–44)
AST: 21 U/L (ref 15–41)
Albumin: 4.2 g/dL (ref 3.5–5.0)
Alkaline Phosphatase: 48 U/L (ref 38–126)
Anion gap: 7 (ref 5–15)
BUN: 24 mg/dL — ABNORMAL HIGH (ref 8–23)
CO2: 26 mmol/L (ref 22–32)
Calcium: 9.4 mg/dL (ref 8.9–10.3)
Chloride: 103 mmol/L (ref 98–111)
Creatinine: 0.83 mg/dL (ref 0.44–1.00)
GFR, Estimated: >60 mL/min (ref >60)
Glucose, Bld: 168 mg/dL — ABNORMAL HIGH (ref 70–99)
Potassium: 4.2 mmol/L (ref 3.5–5.1)
Sodium: 136 mmol/L (ref 135–145)
Total Bilirubin: 0.6 mg/dL (ref 0.0–1.2)
Total Protein: 6.9 g/dL (ref 6.5–8.1)

## 2023-06-09 LAB — MAGNESIUM: Magnesium: 2.2 mg/dL (ref 1.7–2.4)

## 2023-06-09 LAB — TOTAL PROTEIN, URINE DIPSTICK: Protein, ur: 30 mg/dL — AB

## 2023-06-09 MED ORDER — SODIUM CHLORIDE 0.9% FLUSH
10.0000 mL | INTRAVENOUS | Status: DC | PRN
Start: 2023-06-09 — End: 2023-06-09
  Administered 2023-06-09: 10 mL

## 2023-06-09 MED ORDER — SODIUM CHLORIDE 0.9% FLUSH
10.0000 mL | Freq: Once | INTRAVENOUS | Status: AC
  Administered 2023-06-09: 10 mL via INTRAVENOUS

## 2023-06-09 MED ORDER — HEPARIN SOD (PORK) LOCK FLUSH 100 UNIT/ML IV SOLN
500.0000 [IU] | Freq: Once | INTRAVENOUS | Status: AC | PRN
Start: 2023-06-09 — End: 2023-06-09
  Administered 2023-06-09: 500 [IU]

## 2023-06-09 MED ORDER — SODIUM CHLORIDE 0.9 % IV SOLN
15.0000 mg/kg | Freq: Once | INTRAVENOUS | Status: AC
  Administered 2023-06-09: 800 mg via INTRAVENOUS
  Filled 2023-06-09: qty 32

## 2023-06-09 MED ORDER — SODIUM CHLORIDE 0.9 % IV SOLN: Freq: Once | INTRAVENOUS | Status: AC

## 2023-06-09 NOTE — Progress Notes (Signed)
 [facility] OFFICE PROGRESS NOTE   Diagnosis: Peritoneal carcinoma  INTERVAL HISTORY:   Nancy Harrison returns as scheduled.  She completed another treatment with bevacizumab on 05/19/2023.  No bleeding or symptom of thrombosis.  She continues olaparib.  No rash or diarrhea.  Objective:  Vital signs in last 24 hours:  Blood pressure 129/71, pulse 97, temperature 97.9 F (36.6 C), temperature source Temporal, resp. rate 18, weight 121 lb (54.9 kg), SpO2 97%.    HEENT: No thrush or ulcers Resp: Lungs clear bilaterally Cardio: Regular rate and rhythm GI: No hepatosplenomegaly, no apparent ascites, nontender, right abdomen ostomy with brown stool Vascular: No leg edema   Portacath/PICC-without erythema  Lab Results:  Lab Results  Component Value Date   WBC 4.8 06/09/2023   HGB 13.1 06/09/2023   HCT 39.2 06/09/2023   MCV 107.7 (H) 06/09/2023   PLT 214 06/09/2023   NEUTROABS 3.0 06/09/2023    CMP  Lab Results  Component Value Date   NA 137 05/19/2023   K 4.1 05/19/2023   CL 103 05/19/2023   CO2 25 05/19/2023   GLUCOSE 125 (H) 05/19/2023   BUN 27 (H) 05/19/2023   CREATININE 0.80 05/19/2023   CALCIUM 9.9 05/19/2023   PROT 7.6 05/19/2023   ALBUMIN 4.3 05/19/2023   AST 23 05/19/2023   ALT 12 05/19/2023   ALKPHOS 49 05/19/2023   BILITOT 0.8 05/19/2023   GFRNONAA >60 05/19/2023     Medications: I have reviewed the patient's current medications.   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  Bone lesions concerning for metastases, right lung nodule Laparoscopy, diverting loop ileostomy, gastrostomy tube placement, peritoneal biopsy 10/16/2021, no evidence of a  primary tumor site at the appendix, ovaries, or uterus.  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  CDX2, CK20, and GATA3 negative.  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 Foundation 1-MSS, tumor mutation burden 5, HRD positive-LOH score 34.1% 08/11/2021 CA125 818 CT chest 11/03/2021-10 mm right lower lobe nodule, scattered tiny pulmonary nodules, peritoneal carcinomatosis Cycle 1 Taxol/carboplatin 11/12/2021 Cycle 2 Taxol/carboplatin 12/03/2021 Cycle 3 Taxol/carboplatin 12/24/2021 Bone scan 01/05/2022-negative for metastatic disease Cycle 4 Taxol/carboplatin 01/13/2022 CTs 01/22/2022-reduction in peritoneal carcinomatosis, no evidence of progressive disease stable bilateral lower lobe pulmonary nodules Cycle 5 Taxol/carboplatin 02/03/2022 Cycle 6 Taxol/carboplatin 02/24/2022 CT abdomen/pelvis 04/19/2022-slight decrease in peritoneal carcinomatosis with residual lesions 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   Abdominal distention/pain and diarrhea secondary to #1 3.   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  Lumpectomy and adjuvant CMF x8 followed by radiation 4. Hypertension 5. Hypothyroidism 6. Barrett's  esophagus 7. Family history of multiple cancers including appendix, colon, and lung cancer 8.  Hospital admission 11/05/2021 with dehydration/prerenal azotemia secondary  to nausea and high output ileostomy 9.  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 Admission 05/29/2022 with dehydration and acute renal injury    Disposition: Mr. Weigand appears stable.  She is tolerating the bevacizumab and olaparib well.  The CA125 has been slightly higher over the past few months.  We will follow-up on the CA125 from today.  She will continue the current treatment.  We will schedule repeat imaging for a progressive and consistent rise in the CA125.  Nancy Harrison will return for an office visit and bevacizumab in 3 weeks.  Coni Deep, MD  06/09/2023  10:17 AM

## 2023-06-09 NOTE — Progress Notes (Signed)
 Patient seen by Dr. Coni Deep today  Vitals are within treatment parameters:Yes   Labs are within treatment parameters: No (Please specify and give further instructions.)  OK to proceed with urine protein 30  Treatment plan has been signed: Yes   Per physician team, Patient is ready for treatment and there are NO modifications to the treatment plan.

## 2023-06-09 NOTE — Patient Instructions (Signed)

## 2023-06-09 NOTE — Patient Instructions (Signed)
 [facility] A DEPT OF [facility]   Discharge Instructions: Thank you for choosing [facility] to provide your oncology and hematology care.   If you have a lab appointment with the Cancer Center, please go directly to the Cancer Center and check in at the registration area.   Wear comfortable clothing and clothing appropriate for easy access to any Portacath or PICC line.   We strive to give you quality time with your provider. You may need to reschedule your appointment if you arrive late (15 or more minutes).  Arriving late affects you and other patients whose appointments are after yours.  Also, if you miss three or more appointments without notifying the office, you may be dismissed from the clinic at the provider's discretion.      For prescription refill requests, have your pharmacy contact our office and allow 72 hours for refills to be completed.    Today you received the following chemotherapy and/or immunotherapy agents AVASTIN       To help prevent nausea and vomiting after your treatment, we encourage you to take your nausea medication as directed.  BELOW ARE SYMPTOMS THAT SHOULD BE REPORTED IMMEDIATELY: *FEVER GREATER THAN 100.4 F (38 C) OR HIGHER *CHILLS OR SWEATING *NAUSEA AND VOMITING THAT IS NOT CONTROLLED WITH YOUR NAUSEA MEDICATION *UNUSUAL SHORTNESS OF BREATH *UNUSUAL BRUISING OR BLEEDING *URINARY PROBLEMS (pain or burning when urinating, or frequent urination) *BOWEL PROBLEMS (unusual diarrhea, constipation, pain near the anus) TENDERNESS IN MOUTH AND THROAT WITH OR WITHOUT PRESENCE OF ULCERS (sore throat, sores in mouth, or a toothache) UNUSUAL RASH, SWELLING OR PAIN  UNUSUAL VAGINAL DISCHARGE OR ITCHING   Items with * indicate a potential emergency and should be followed up as soon as possible or go to the Emergency Department if any problems should occur.  Please show the CHEMOTHERAPY ALERT CARD or IMMUNOTHERAPY  ALERT CARD at check-in to the Emergency Department and triage nurse.  Should you have questions after your visit or need to cancel or reschedule your appointment, please contact [facility]  Dept: [phone]  and follow the prompts.  Office hours are 8:00 a.m. to 4:30 p.m. Monday - Friday. Please note that voicemails left after 4:00 p.m. may not be returned until the following business day.  We are closed weekends and major holidays. You have access to a nurse at all times for urgent questions. Please call the main number to the clinic Dept: [phone] and follow the prompts.   For any non-urgent questions, you may also contact your provider using MyChart. We now offer e-Visits for anyone 79 and older to request care online for non-urgent symptoms. For details visit [email or web address].   Also download the MyChart app! Go to the app store, search "MyChart", open the app, select [place], and log in with your MyChart username and password.  Bevacizumab Injection What is this medication? BEVACIZUMAB (be va SIZ yoo mab) treats some types of cancer. It works by blocking a protein that causes cancer cells to grow and multiply. This helps to slow or stop the spread of cancer cells. It is a monoclonal antibody. This medicine may be used for other purposes; ask your health care provider or pharmacist if you have questions. COMMON BRAND NAME(S): Alymsys, Avastin, MVASI, Ros[place] Lao What should I tell my care team before I take this medication? They need to know if you have any  of these conditions: Blood clots Coughing up blood Having or recent surgery Heart failure High blood pressure History of a connection between 2 or more body parts that do not usually connect (fistula) History of a tear in your stomach or intestines Protein in your urine An unusual or allergic reaction to bevacizumab, other medications, foods, dyes, or  preservatives Pregnant or trying to get pregnant Breast-feeding How should I use this medication? This medication is injected into a vein. It is given by your care team in a hospital or clinic setting. Talk to your care team the use of this medication in children. Special care may be needed. Overdosage: If you think you have taken too much of this medicine contact a poison control center or emergency room at once. NOTE: This medicine is only for you. Do not share this medicine with others. What if I miss a dose? Keep appointments for follow-up doses. It is important not to miss your dose. Call your care team if you are unable to keep an appointment. What may interact with this medication? Interactions are not expected. This list may not describe all possible interactions. Give your health care provider a list of all the medicines, herbs, non-prescription drugs, or dietary supplements you use. Also tell them if you smoke, drink alcohol, or use illegal drugs. Some items may interact with your medicine. What should I watch for while using this medication? Your condition will be monitored carefully while you are receiving this medication. You may need blood work while taking this medication. This medication may make you feel generally unwell. This is not uncommon as chemotherapy can affect healthy cells as well as cancer cells. Report any side effects. Continue your course of treatment even though you feel ill unless your care team tells you to stop. This medication may increase your risk to bruise or bleed. Call your care team if you notice any unusual bleeding. Before having surgery, talk to your care team to make sure it is ok. This medication can increase the risk of poor healing of your surgical site or wound. You will need to stop this medication for 28 days before surgery. After surgery, wait at least 28 days before restarting this medication. Make sure the surgical site or wound is healed enough  before restarting this medication. Talk to your care team if questions. Talk to your care team if you may be pregnant. Serious birth defects can occur if you take this medication during pregnancy and for 6 months after the last dose. Contraception is recommended while taking this medication and for 6 months after the last dose. Your care team can help you find the option that works for you. Do not breastfeed while taking this medication and for 6 months after the last dose. This medication can cause infertility. Talk to your care team if you are concerned about your fertility. What side effects may I notice from receiving this medication? Side effects that you should report to your care team as soon as possible: Allergic reactions--skin rash, itching, hives, swelling of the face, lips, tongue, or throat Bleeding--bloody or black, tar-like stools, vomiting blood or brown material that looks like coffee grounds, red or dark brown urine, small red or purple spots on skin, unusual bruising or bleeding Blood clot--pain, swelling, or warmth in the leg, shortness of breath, chest pain Heart attack--pain or tightness in the chest, shoulders, arms, or jaw, nausea, shortness of breath, cold or clammy skin, feeling faint or lightheaded Heart failure--shortness of breath,  swelling of the ankles, feet, or hands, sudden weight gain, unusual weakness or fatigue Increase in blood pressure Infection--fever, chills, cough, sore throat, wounds that don't heal, pain or trouble when passing urine, general feeling of discomfort or being unwell Infusion reactions--chest pain, shortness of breath or trouble breathing, feeling faint or lightheaded Kidney injury--decrease in the amount of urine, swelling of the ankles, hands, or feet Stomach pain that is severe, does not go away, or gets worse Stroke--sudden numbness or weakness of the face, arm, or leg, trouble speaking, confusion, trouble walking, loss of balance or  coordination, dizziness, severe headache, change in vision Sudden and severe headache, confusion, change in vision, seizures, which may be signs of posterior reversible encephalopathy syndrome (PRES) Side effects that usually do not require medical attention (report to your care team if they continue or are bothersome): Back pain Change in taste Diarrhea Dry skin Increased tears Nosebleed This list may not describe all possible side effects. Call your doctor for medical advice about side effects. You may report side effects to FDA at 1-800-FDA-1088. Where should I keep my medication? This medication is given in a hospital or clinic. It will not be stored at home. NOTE: This sheet is a summary. It may not cover all possible information. If you have questions about this medicine, talk to your doctor, pharmacist, or health care provider.  2024 Elsevier/Gold Standard (2021-06-26 00:00:00)

## 2023-06-10 LAB — CA 125: Cancer Antigen (CA) 125: 57.6 U/mL — ABNORMAL HIGH (ref 0.0–38.1)

## 2023-06-11 ENCOUNTER — Other Ambulatory Visit: Payer: Self-pay

## 2023-06-14 DIAGNOSIS — Z932 Ileostomy status: Secondary | ICD-10-CM | POA: Diagnosis not present

## 2023-06-16 DIAGNOSIS — Z932 Ileostomy status: Secondary | ICD-10-CM | POA: Diagnosis not present

## 2023-06-19 ENCOUNTER — Other Ambulatory Visit: Payer: Self-pay | Admitting: Oncology

## 2023-06-20 ENCOUNTER — Encounter: Payer: Self-pay | Admitting: Oncology

## 2023-06-22 DIAGNOSIS — Z1231 Encounter for screening mammogram for malignant neoplasm of breast: Secondary | ICD-10-CM | POA: Diagnosis not present

## 2023-06-26 ENCOUNTER — Other Ambulatory Visit: Payer: Self-pay | Admitting: Oncology

## 2023-06-27 ENCOUNTER — Encounter: Payer: Self-pay | Admitting: Oncology

## 2023-06-29 ENCOUNTER — Other Ambulatory Visit: Payer: Self-pay | Admitting: Oncology

## 2023-06-29 ENCOUNTER — Other Ambulatory Visit: Payer: Self-pay

## 2023-06-30 ENCOUNTER — Inpatient Hospital Stay

## 2023-06-30 ENCOUNTER — Inpatient Hospital Stay: Admitting: Nurse Practitioner

## 2023-06-30 ENCOUNTER — Inpatient Hospital Stay: Attending: Oncology

## 2023-06-30 ENCOUNTER — Encounter: Payer: Self-pay | Admitting: Nurse Practitioner

## 2023-06-30 ENCOUNTER — Ambulatory Visit: Admitting: Nurse Practitioner

## 2023-06-30 ENCOUNTER — Telehealth: Payer: Self-pay | Admitting: Nurse Practitioner

## 2023-06-30 VITALS — BP 138/74 | HR 86 | Temp 98.1°F | Resp 18 | Ht 59.0 in | Wt 123.7 lb

## 2023-06-30 DIAGNOSIS — Z95828 Presence of other vascular implants and grafts: Secondary | ICD-10-CM

## 2023-06-30 DIAGNOSIS — Z5112 Encounter for antineoplastic immunotherapy: Secondary | ICD-10-CM | POA: Insufficient documentation

## 2023-06-30 DIAGNOSIS — Z79899 Other long term (current) drug therapy: Secondary | ICD-10-CM | POA: Insufficient documentation

## 2023-06-30 DIAGNOSIS — C481 Malignant neoplasm of specified parts of peritoneum: Secondary | ICD-10-CM | POA: Diagnosis not present

## 2023-06-30 LAB — CMP (CANCER CENTER ONLY)
ALT: 14 U/L (ref 0–44)
AST: 28 U/L (ref 15–41)
Albumin: 3.8 g/dL (ref 3.5–5.0)
Alkaline Phosphatase: 54 U/L (ref 38–126)
Anion gap: 9 (ref 5–15)
BUN: 18 mg/dL (ref 8–23)
CO2: 25 mmol/L (ref 22–32)
Calcium: 9.6 mg/dL (ref 8.9–10.3)
Chloride: 104 mmol/L (ref 98–111)
Creatinine: 0.75 mg/dL (ref 0.44–1.00)
GFR, Estimated: >60 mL/min (ref >60)
Glucose, Bld: 142 mg/dL — ABNORMAL HIGH (ref 70–99)
Potassium: 4.5 mmol/L (ref 3.5–5.1)
Sodium: 138 mmol/L (ref 135–145)
Total Bilirubin: 0.3 mg/dL (ref 0.0–1.2)
Total Protein: 6.6 g/dL (ref 6.5–8.1)

## 2023-06-30 LAB — CBC WITH DIFFERENTIAL (CANCER CENTER ONLY)
Abs Immature Granulocytes: 0.02 10*3/uL (ref 0.00–0.07)
Basophils Absolute: 0.1 10*3/uL (ref 0.0–0.1)
Basophils Relative: 1 %
Eosinophils Absolute: 0.3 10*3/uL (ref 0.0–0.5)
Eosinophils Relative: 5 %
HCT: 39.2 % (ref 36.0–46.0)
Hemoglobin: 12.9 g/dL (ref 12.0–15.0)
Immature Granulocytes: 0 %
Lymphocytes Relative: 20 %
Lymphs Abs: 1.1 10*3/uL (ref 0.7–4.0)
MCH: 35.9 pg — ABNORMAL HIGH (ref 26.0–34.0)
MCHC: 32.9 g/dL (ref 30.0–36.0)
MCV: 109.2 fL — ABNORMAL HIGH (ref 80.0–100.0)
Monocytes Absolute: 0.5 10*3/uL (ref 0.1–1.0)
Monocytes Relative: 9 %
Neutro Abs: 3.4 10*3/uL (ref 1.7–7.7)
Neutrophils Relative %: 65 %
Platelet Count: 213 10*3/uL (ref 150–400)
RBC: 3.59 MIL/uL — ABNORMAL LOW (ref 3.87–5.11)
RDW: 14.8 % (ref 11.5–15.5)
WBC Count: 5.3 10*3/uL (ref 4.0–10.5)
nRBC: 0 % (ref 0.0–0.2)

## 2023-06-30 LAB — TOTAL PROTEIN, URINE DIPSTICK: Protein, ur: NEGATIVE mg/dL

## 2023-06-30 MED ORDER — SODIUM CHLORIDE 0.9 % IV SOLN
15.0000 mg/kg | Freq: Once | INTRAVENOUS | Status: AC
  Administered 2023-06-30: 800 mg via INTRAVENOUS
  Filled 2023-06-30: qty 32

## 2023-06-30 MED ORDER — HEPARIN SOD (PORK) LOCK FLUSH 100 UNIT/ML IV SOLN
500.0000 [IU] | Freq: Once | INTRAVENOUS | Status: AC | PRN
  Administered 2023-06-30: 500 [IU]

## 2023-06-30 MED ORDER — SODIUM CHLORIDE 0.9% FLUSH
10.0000 mL | INTRAVENOUS | Status: DC | PRN
Start: 2023-06-30 — End: 2023-06-30
  Administered 2023-06-30: 10 mL

## 2023-06-30 MED ORDER — SODIUM CHLORIDE 0.9 % IV SOLN: Freq: Once | INTRAVENOUS | Status: AC

## 2023-06-30 MED ORDER — SODIUM CHLORIDE 0.9% FLUSH
10.0000 mL | Freq: Once | INTRAVENOUS | Status: AC
Start: 2023-06-30 — End: 2023-06-30
  Administered 2023-06-30: 10 mL via INTRAVENOUS

## 2023-06-30 NOTE — Progress Notes (Signed)
 [facility]     [phone] [phone]    Patient Care Team: Alejandro Hurt, FNP as PCP - General (Family Medicine) Sumner Ends, MD as Consulting Physician (Oncology) Lutricia Salts, Jordis Nevins, RN as Oncology Nurse Navigator Suzi Essex, MD as Consulting Physician (Gynecologic Oncology) Candyce Champagne, MD as Consulting Physician (General Surgery)   CHIEF COMPLAINT: Follow up peritoneal carcinoma   CURRENT THERAPY: Olaparib  and Bevacizumab   INTERVAL HISTORY [name] returns for follow up and treatment. Denies changes since last visit. Energy and appetite are the same. Stool output is unchanged. Tolerating olaparib  without side effects. Denies bleeding or signs of thrombosis. She had a tooth pulled and has completed antibiotics, healing well.   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   Cataract    Family history of colon cancer    Gastrostomy in place [facility])    LUQ - placed 10/16/2021   Hypertension    Ileostomy, has currently (HCC)    placed 09/2021   SUI (stress urinary incontinence, female)    Thyroid  disease    Urinary urgency      Past Surgical History:  Procedure Laterality Date   BREAST LUMPECTOMY  1999   Chemo and radiation on right breast   IR IMAGING GUIDED PORT INSERTION  11/11/2021   LAPAROSCOPY N/A 10/16/2021   Procedure: LAPAROSCOPIC  DIVERTING OSTOMY, BIOSPY,  G-TUBE PLACEMENT;  Surgeon: Candyce Champagne, MD;  Location: [facility];  Service: General;  Laterality: N/A;   TONSILLECTOMY  1957   TOTAL KNEE ARTHROPLASTY  1999 & 2001     Outpatient Encounter Medications as of 06/30/2023  Medication Sig Note   acetaminophen  (TYLENOL ) 325 MG tablet Take 2 tablets (650 mg total) by mouth every 6 (six) hours as needed for  mild pain or fever.    amLODipine (NORVASC) 2.5 MG tablet Take 2.5 mg by mouth daily.    amoxicillin (AMOXIL) 500 MG capsule Take 500 mg by mouth 3 (three) times daily.    diphenoxylate -atropine  (LOMOTIL ) 2.5-0.025 MG tablet TAKE 1 TABLET BY MOUTH EVERY 12 HOURS FOR LOOSE STOOL    fluticasone (FLONASE) 50 MCG/ACT nasal spray Place 2 sprays into both nostrils 2 (two) times daily.    ibuprofen (ADVIL) 200 MG tablet Take 400 mg by mouth 2 (two) times daily as needed.    lactose free nutrition (BOOST PLUS) LIQD Take 237 mLs by mouth 3 (three) times daily with meals. (Patient taking differently: Take 237 mLs by mouth daily.) 04/28/2023: Taking once daily   loperamide  (IMODIUM  A-D) 2 MG tablet Take 2 tablets (4 mg total) by mouth 4 (four) times daily as needed for diarrhea or loose stools. (Patient taking differently: Take 4 mg by mouth in the morning, at noon, and at bedtime.) 06/09/2022: Taking 4 mg tid   loratadine (CLARITIN) 10 MG tablet Take 10 mg by mouth daily.    magnesium  oxide (MAG-OX) 400 (240 Mg) MG tablet TAKE 1 TABLET BY MOUTH 2 TIMES A DAY    mirtazapine  (REMERON ) 7.5 MG tablet TAKE 1 TABLET BY MOUTH AT BEDTIME    Multiple Vitamin (MULTIVITAMIN PO) Take 1 tablet by mouth daily with breakfast.    nystatin  (MYCOSTATIN /NYSTOP ) powder APPLY TO AFFECTED  AREA(S) THREE TIMES A DAY TOPICALLY    olaparib  ([name] ) 150 MG tablet Take 1 tablet (150 mg total) by mouth 2 (two) times daily.    potassium chloride  (MICRO-K ) 10 MEQ CR capsule TAKE 1 CAPSULE BY MOUTH 2 TIMES A DAY (Patient taking differently: Take 10 mEq by mouth daily.)    ondansetron  (ZOFRAN ) 4 MG tablet Take 4 mg by mouth as needed. (Patient not taking: Reported on 06/30/2023)    Facility-Administered Encounter Medications as of 06/30/2023  Medication   0.9 %  sodium chloride  infusion   dexamethasone  (DECADRON ) 10 mg in sodium chloride  0.9 % 50 mL IVPB   [COMPLETED] sodium chloride  flush (NS) 0.9 % injection 10 mL     Today's Vitals    06/30/23 1128 06/30/23 1129  BP: (!) 145/73 138/74  Pulse: 86   Resp: 18   Temp: 98.1 F (36.7 C)   TempSrc: Temporal   SpO2: 100%   Weight: 123 lb 11.2 oz (56.1 kg)   Height: 4\' 11"  (1.499 m)   PainSc:  0-No pain   Body mass index is 24.98 kg/m.   ECOG PERFORMANCE STATUS: 1 - Symptomatic but completely ambulatory  PHYSICAL EXAM GENERAL:alert, no distress and comfortable SKIN: no rash  HEENT: sclera clear, neck without mass. Broken tooth at the gumline of lower jaw, no erythema or drainage LYMPH:  no palpable cervical or supraclavicular lymphadenopathy  LUNGS: clear with normal breathing effort HEART: regular rate & rhythm, no lower extremity edema ABDOMEN: abdomen soft, non-tender and normal bowel sounds NEURO: alert & oriented x 3 with fluent speech PAC without erythema    CBC    Latest Ref Rng & Units 06/30/2023   11:15 AM 06/09/2023    9:46 AM 05/19/2023   10:35 AM  CBC  WBC 4.0 - 10.5 K/uL 5.3  4.8  6.6   Hemoglobin 12.0 - 15.0 g/dL 16.1  09.6  04.5   Hematocrit 36.0 - 46.0 % 39.2  39.2  42.5   Platelets 150 - 400 K/uL 213  214  233       CMP     Latest Ref Rng & Units 06/30/2023   11:15 AM 06/09/2023    9:46 AM 05/19/2023   10:35 AM  CMP  Glucose 70 - 99 mg/dL 409  811  914   BUN 8 - 23 mg/dL 18  24  27    Creatinine 0.44 - 1.00 mg/dL 7.82  9.56  2.13   Sodium 135 - 145 mmol/L 138  136  137   Potassium 3.5 - 5.1 mmol/L 4.5  4.2  4.1   Chloride 98 - 111 mmol/L 104  103  103   CO2 22 - 32 mmol/L 25  26  25    Calcium  8.9 - 10.3 mg/dL 9.6  9.4  9.9   Total Protein 6.5 - 8.1 g/dL 6.6  6.9  7.6   Total Bilirubin 0.0 - 1.2 mg/dL 0.3  0.6  0.8   Alkaline Phos 38 - 126 U/L 54  48  49   AST 15 - 41 U/L 28  21  23    ALT 0 - 44 U/L 14  11  12        ASSESSMENT & PLAN: 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  Bone lesions concerning for  metastases, right lung nodule Laparoscopy, diverting loop ileostomy, gastrostomy tube placement, peritoneal biopsy 10/16/2021, no evidence of a primary tumor site at the appendix, ovaries, or uterus.  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  CDX2, CK20, and GATA3 negative.  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 Foundation 1-MSS, tumor mutation burden 5, HRD positive-LOH score 34.1% 08/11/2021 CA125 818 CT chest 11/03/2021-10 mm right lower lobe nodule, scattered tiny pulmonary nodules, peritoneal carcinomatosis Cycle 1 Taxol /carboplatin  11/12/2021 Cycle 2 Taxol /carboplatin  12/03/2021 Cycle 3 Taxol /carboplatin  12/24/2021 Bone scan 01/05/2022-negative for metastatic disease Cycle 4 Taxol /carboplatin  01/13/2022 CTs 01/22/2022-reduction in peritoneal carcinomatosis, no evidence of progressive disease stable bilateral lower lobe pulmonary nodules Cycle 5 Taxol /carboplatin  02/03/2022 Cycle 6 Taxol /carboplatin  02/24/2022 CT abdomen/pelvis 04/19/2022-slight decrease in peritoneal carcinomatosis with residual lesions 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 Olaparib  05/14/2022 Bevacizumab  every 3 weeks 05/19/2022 Olaparib  held on hospital admission 05/29/2022 Olaparib  resumed 06/09/2022, dose reduced to 150 mg twice daily CT abdomen/pelvis 09/06/2022-decrease in size and conspicuity of peritoneal carcinomatosis, unchanged left adrenal nodule Olaparib /bevacizumab  continued CT abdomen/pelvis 03/16/2023: No progressive disease, stable ill-defined soft tissue in the gastrocolic ligament, stable right lower lobe  nodule Olaparib /bevacizumab  continued 2.   Abdominal distention/pain and diarrhea secondary to #1 3.   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  Lumpectomy and adjuvant CMF x8 followed by radiation 4. Hypertension 5. Hypothyroidism 6. Barrett's esophagus 7. Family history of multiple cancers including appendix, colon, and lung cancer 8.  Hospital admission 11/05/2021 with dehydration/prerenal azotemia secondary to nausea and high output ileostomy 9.  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 Admission 05/29/2022 with dehydration and acute renal injury   Disposition:  Mrs. Duran appears stable. She continues oral olaparib  and bevacizumab  q3 weeks, tolerating without significant side effects. PS remains adequate. There is no clinical evidence of progression. She has healed from a recent dental procedure.   Labs reviewed, will f/up the pending CA 125 from today and restage for progressive/consistent rise.   She will continue olaparib  and proceed with bevacizumab  today as scheduled.   Follow up with next bevacizumab  in 3 weeks as scheduled. Case reviewed with Dr. Scherrie Curt.    Orders Placed This Encounter  Procedures   CBC with Differential (Cancer Center Only)    Standing Status:   Future    Expected Date:   08/11/2023    Expiration Date:   08/10/2024   Total Protein, Urine dipstick    Standing Status:   Future    Expected Date:   08/11/2023    Expiration Date:   08/10/2024   CA 125    Standing Status:   Future    Expected Date:   08/11/2023    Expiration Date:   06/29/2024      All questions were answered. The patient knows to call the clinic with any problems, questions or concerns. No barriers to learning were detected.   Kevan Prouty K Sharrieff Spratlin, NP 06/30/2023

## 2023-06-30 NOTE — Progress Notes (Signed)
 Patient seen by Lacie Burton, NP today  Vitals are within treatment parameters:Yes  Increased 2 pounds Labs are within treatment parameters: Yes   Treatment plan has been signed: Yes   Per physician team, Patient is ready for treatment and there are NO modifications to the treatment plan.

## 2023-06-30 NOTE — Telephone Encounter (Signed)
 Patient has been scheduled for follow-up visit per 06/30/23 LOS.  LVM notifying pt of appt details, provided my direct number to pt if appt changes need to be made.

## 2023-07-01 ENCOUNTER — Other Ambulatory Visit: Payer: Self-pay

## 2023-07-01 LAB — CA 125: Cancer Antigen (CA) 125: 75.5 U/mL — ABNORMAL HIGH (ref 0.0–38.1)

## 2023-07-08 ENCOUNTER — Encounter (HOSPITAL_BASED_OUTPATIENT_CLINIC_OR_DEPARTMENT_OTHER): Payer: Self-pay | Admitting: Student

## 2023-07-08 ENCOUNTER — Ambulatory Visit (HOSPITAL_BASED_OUTPATIENT_CLINIC_OR_DEPARTMENT_OTHER): Admitting: Student

## 2023-07-08 ENCOUNTER — Other Ambulatory Visit: Payer: Self-pay

## 2023-07-08 ENCOUNTER — Ambulatory Visit (HOSPITAL_BASED_OUTPATIENT_CLINIC_OR_DEPARTMENT_OTHER)

## 2023-07-08 DIAGNOSIS — M79605 Pain in left leg: Secondary | ICD-10-CM

## 2023-07-08 DIAGNOSIS — M79672 Pain in left foot: Secondary | ICD-10-CM | POA: Diagnosis not present

## 2023-07-08 DIAGNOSIS — Z96652 Presence of left artificial knee joint: Secondary | ICD-10-CM | POA: Diagnosis not present

## 2023-07-08 DIAGNOSIS — M19072 Primary osteoarthritis, left ankle and foot: Secondary | ICD-10-CM | POA: Diagnosis not present

## 2023-07-08 DIAGNOSIS — M25562 Pain in left knee: Secondary | ICD-10-CM | POA: Diagnosis not present

## 2023-07-08 MED ORDER — MELOXICAM 15 MG PO TABS: 15.0000 mg | ORAL_TABLET | Freq: Every day | ORAL | 0 refills | Status: AC

## 2023-07-08 NOTE — Progress Notes (Signed)
 Chief Complaint: Left leg and foot pain     History of Present Illness:    Nancy Harrison is a 83 y.o. female who presents today with pain along the anterior left lower extremity and foot.  She states this is been ongoing for a couple of months and has been slowly getting worse.  She only notices the pain when weightbearing, and does not experience any at rest.  Denies any known injury.  Pain begins over the proximal third of the anterior tibia and radiates down to the anterior ankle and medial left foot.  She has tried stretching, Tylenol , ibuprofen, and compression which provide some temporary relief.  Does have history of a left knee replacement approximately 25 years ago.  Denies any numbness or tingling.  She ambulates with a cane.   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   Cataract    Family history of colon cancer    Gastrostomy in place [facility])    LUQ - placed 10/16/2021   Hypertension    Ileostomy, has currently (HCC)    placed 09/2021   SUI (stress urinary incontinence, female)    Thyroid  disease    Urinary urgency    Past Surgical History:  Procedure Laterality Date   BREAST LUMPECTOMY  1999   Chemo and radiation on right breast   IR IMAGING GUIDED PORT INSERTION  11/11/2021   LAPAROSCOPY N/A 10/16/2021   Procedure: LAPAROSCOPIC  DIVERTING OSTOMY, BIOSPY,  G-TUBE PLACEMENT;  Surgeon: Candyce Champagne, MD;  Location: [facility];  Service: General;  Laterality: N/A;   TONSILLECTOMY  1957   TOTAL KNEE ARTHROPLASTY  1999 & 2001   Social History   Socioeconomic History   Marital status: Married    Spouse name: Not on file   Number of children: Not on file   Years of  education: Not on file   Highest education level: Not on file  Occupational History   Not on file  Tobacco Use   Smoking status: Former    Current packs/day: 0.00    Types: Cigarettes    Quit date: 02/23/1972    Years since quitting: 51.4   Smokeless tobacco: Never  Substance and Sexual Activity   Alcohol use: Yes    Alcohol/week: 8.0 standard drinks of alcohol    Types: 8 Glasses of wine per week    Comment: one glass of wine per night   Drug use: No   Sexual activity: Not on file  Other Topics Concern   Not on file  Social History Narrative   Not on file   Social Drivers of Health   Financial Resource Strain: Low Risk  (11/11/2021)   Overall Financial Resource Strain (CARDIA)    Difficulty of Paying Living Expenses: Not hard at all  Food Insecurity: No Food Insecurity (05/30/2022)   Hunger Vital Sign    Worried About Running Out of Food in the Last Year: Never true    Ran Out of Food in the Last Year: Never true  Transportation Needs: No Transportation  Needs (05/30/2022)   PRAPARE - Administrator, Civil Service (Medical): No    Lack of Transportation (Non-Medical): No  Physical Activity: Not on file  Stress: Not on file  Social Connections: Not on file   Family History  Problem Relation Age of Onset   Colon cancer Mother 29   Lung cancer Sister    Cancer Maternal Aunt        NOS   Cancer Daughter        appendiceal   Stomach cancer Neg Hx    Rectal cancer Neg Hx    Esophageal cancer Neg Hx    Allergies  Allergen Reactions   Sporanox [Itraconazole] Rash   Current Outpatient Medications  Medication Sig Dispense Refill   meloxicam (MOBIC) 15 MG tablet Take 1 tablet (15 mg total) by mouth daily for 7 days. 7 tablet 0   acetaminophen  (TYLENOL ) 325 MG tablet Take 2 tablets (650 mg total) by mouth every 6 (six) hours as needed for mild pain or fever.     amLODipine (NORVASC) 2.5 MG tablet Take 2.5 mg by mouth daily.     amoxicillin (AMOXIL) 500 MG capsule  Take 500 mg by mouth 3 (three) times daily.     diphenoxylate -atropine  (LOMOTIL ) 2.5-0.025 MG tablet TAKE 1 TABLET BY MOUTH EVERY 12 HOURS FOR LOOSE STOOL 60 tablet 2   fluticasone (FLONASE) 50 MCG/ACT nasal spray Place 2 sprays into both nostrils 2 (two) times daily.     ibuprofen (ADVIL) 200 MG tablet Take 400 mg by mouth 2 (two) times daily as needed.     lactose free nutrition (BOOST PLUS) LIQD Take 237 mLs by mouth 3 (three) times daily with meals. (Patient taking differently: Take 237 mLs by mouth daily.)  0   loperamide  (IMODIUM  A-D) 2 MG tablet Take 2 tablets (4 mg total) by mouth 4 (four) times daily as needed for diarrhea or loose stools. (Patient taking differently: Take 4 mg by mouth in the morning, at noon, and at bedtime.) 60 tablet 2   loratadine (CLARITIN) 10 MG tablet Take 10 mg by mouth daily.     magnesium  oxide (MAG-OX) 400 (240 Mg) MG tablet TAKE 1 TABLET BY MOUTH 2 TIMES A DAY 60 tablet 2   mirtazapine  (REMERON ) 7.5 MG tablet TAKE 1 TABLET BY MOUTH AT BEDTIME 30 tablet 5   Multiple Vitamin (MULTIVITAMIN PO) Take 1 tablet by mouth daily with breakfast.     nystatin  (MYCOSTATIN /NYSTOP ) powder APPLY TO AFFECTED AREA(S) THREE TIMES A DAY TOPICALLY 30 g 0   olaparib  ([name] ) 150 MG tablet Take 1 tablet (150 mg total) by mouth 2 (two) times daily. 60 tablet 2   ondansetron  (ZOFRAN ) 4 MG tablet Take 4 mg by mouth as needed. (Patient not taking: Reported on 06/30/2023)     potassium chloride  (MICRO-K ) 10 MEQ CR capsule TAKE 1 CAPSULE BY MOUTH 2 TIMES A DAY (Patient taking differently: Take 10 mEq by mouth daily.) 60 capsule 1   No current facility-administered medications for this visit.   Facility-Administered Medications Ordered in Other Visits  Medication Dose Route Frequency Provider Last Rate Last Admin   0.9 %  sodium chloride  infusion   Intravenous Once Sherrill, Gary B, MD       dexamethasone  (DECADRON ) 10 mg in sodium chloride  0.9 % 50 mL IVPB  10 mg Intravenous Once  Sherrill, Gary B, MD       No results found.  Review of Systems:   A ROS was  performed including pertinent positives and negatives as documented in the HPI.  Physical Exam :   Constitutional: NAD and appears stated age Neurological: Alert and oriented Psych: Appropriate affect and cooperative There were no vitals taken for this visit.   Comprehensive Musculoskeletal Exam:    Tenderness over the distal two thirds of the anterior left tibia without any visible or palpable deformity.  She does retain good ankle range of motion with dorsiflexion and plantarflexion, although describes some discomfort with the foot fully plantarflexed.  No tenderness in the knee which demonstrates normal range of motion and stability with varus/valgus stress.  Calf is supple and nontender.  DP pulse 2+.  Imaging:   Xray (left tib-fib 2 views, left foot 3 views): Left knee TKA components appear well-seated without evidence of damage or loosening.  Degenerative changes noted within the ankle and left midfoot.  Decreased bone density noted throughout.   I personally reviewed and interpreted the radiographs.   Assessment:   83 y.o. female with gradually worsening discomfort noted of the left anterior lower extremity extending down into the medial left foot.  X-rays today show some bone density loss as well as arthritic changes within the ankle and foot, however no evidence of fracture or acute abnormality.  Her symptoms do follow the distribution of the tibialis anterior, so I am considering a potential tendinopathy.  Differential would also include a component of medial tibial stress syndrome versus a lumbar radiculopathy.  I will plan to switch her from ibuprofen to meloxicam for a 1 week course, and recommend utilizing ice and rest as well.  Should the symptoms continue to worsen, will likely consider further workup of the lower extremity and/or the low back.  Plan :    - Return to clinic as needed     I  personally saw and evaluated the patient, and participated in the management and treatment plan.  Sharrell Deck, PA-C Orthopedics

## 2023-07-13 ENCOUNTER — Other Ambulatory Visit: Payer: Self-pay | Admitting: Oncology

## 2023-07-13 DIAGNOSIS — Z932 Ileostomy status: Secondary | ICD-10-CM | POA: Diagnosis not present

## 2023-07-17 ENCOUNTER — Other Ambulatory Visit: Payer: Self-pay | Admitting: Oncology

## 2023-07-21 ENCOUNTER — Inpatient Hospital Stay

## 2023-07-21 ENCOUNTER — Other Ambulatory Visit: Payer: Self-pay

## 2023-07-21 ENCOUNTER — Inpatient Hospital Stay: Admitting: Oncology

## 2023-07-21 VITALS — BP 139/73 | HR 83 | Temp 98.1°F | Resp 18 | Ht 59.0 in | Wt 123.0 lb

## 2023-07-21 DIAGNOSIS — Z95828 Presence of other vascular implants and grafts: Secondary | ICD-10-CM

## 2023-07-21 DIAGNOSIS — C481 Malignant neoplasm of specified parts of peritoneum: Secondary | ICD-10-CM | POA: Diagnosis not present

## 2023-07-21 DIAGNOSIS — Z79899 Other long term (current) drug therapy: Secondary | ICD-10-CM | POA: Diagnosis not present

## 2023-07-21 DIAGNOSIS — Z5112 Encounter for antineoplastic immunotherapy: Secondary | ICD-10-CM | POA: Diagnosis not present

## 2023-07-21 LAB — CBC WITH DIFFERENTIAL (CANCER CENTER ONLY)
Abs Immature Granulocytes: 0.01 10*3/uL (ref 0.00–0.07)
Basophils Absolute: 0.1 10*3/uL (ref 0.0–0.1)
Basophils Relative: 1 %
Eosinophils Absolute: 0.2 10*3/uL (ref 0.0–0.5)
Eosinophils Relative: 5 %
HCT: 37.1 % (ref 36.0–46.0)
Hemoglobin: 12.5 g/dL (ref 12.0–15.0)
Immature Granulocytes: 0 %
Lymphocytes Relative: 23 %
Lymphs Abs: 1.1 10*3/uL (ref 0.7–4.0)
MCH: 37.3 pg — ABNORMAL HIGH (ref 26.0–34.0)
MCHC: 33.7 g/dL (ref 30.0–36.0)
MCV: 110.7 fL — ABNORMAL HIGH (ref 80.0–100.0)
Monocytes Absolute: 0.5 10*3/uL (ref 0.1–1.0)
Monocytes Relative: 10 %
Neutro Abs: 2.9 10*3/uL (ref 1.7–7.7)
Neutrophils Relative %: 61 %
Platelet Count: 206 10*3/uL (ref 150–400)
RBC: 3.35 MIL/uL — ABNORMAL LOW (ref 3.87–5.11)
RDW: 15.5 % (ref 11.5–15.5)
WBC Count: 4.8 10*3/uL (ref 4.0–10.5)
nRBC: 0 % (ref 0.0–0.2)

## 2023-07-21 LAB — TOTAL PROTEIN, URINE DIPSTICK

## 2023-07-21 MED ORDER — SODIUM CHLORIDE 0.9 % IV SOLN: INTRAVENOUS | Status: DC

## 2023-07-21 MED ORDER — SODIUM CHLORIDE 0.9 % IV SOLN
15.0000 mg/kg | Freq: Once | INTRAVENOUS | Status: AC
  Administered 2023-07-21: 800 mg via INTRAVENOUS
  Filled 2023-07-21: qty 32

## 2023-07-21 MED ORDER — SODIUM CHLORIDE 0.9% FLUSH
10.0000 mL | INTRAVENOUS | Status: DC | PRN
  Administered 2023-07-21: 10 mL via INTRAVENOUS

## 2023-07-21 MED ORDER — HEPARIN SOD (PORK) LOCK FLUSH 100 UNIT/ML IV SOLN
500.0000 [IU] | Freq: Once | INTRAVENOUS | Status: AC | PRN
  Administered 2023-07-21: 500 [IU]

## 2023-07-21 MED ORDER — SODIUM CHLORIDE 0.9% FLUSH
10.0000 mL | INTRAVENOUS | Status: DC | PRN
  Administered 2023-07-21: 10 mL

## 2023-07-21 NOTE — Progress Notes (Signed)
 [facility] OFFICE PROGRESS NOTE   Diagnosis: Peritoneal carcinoma  INTERVAL HISTORY:   Ms. Lueck returns as scheduled.  She continues olaparib  she last received bevacizumab  on 06/30/2023.  No bleeding or symptom of thrombosis.  No mouth sores or abdominal pain.  The ostomy is functioning well.  The stool is mostly liquid.  Objective:  Vital signs in last 24 hours:  Blood pressure 139/73, pulse 83, temperature 98.1 F (36.7 C), temperature source Temporal, resp. rate 18, height 4\' 11"  (1.499 m), weight 123 lb (55.8 kg), SpO2 98%.    HEENT: No thrush or ulcers Resp: Clear bilaterally Cardio: Regular rate and rhythm GI: No hepatosplenomegaly, mild tenderness in the left mid abdomen, no mass, right abdomen ileostomy with liquid stool Vascular: No leg edema   Portacath/PICC-without erythema  Lab Results:  Lab Results  Component Value Date   WBC 4.8 07/21/2023   HGB 12.5 07/21/2023   HCT 37.1 07/21/2023   MCV 110.7 (H) 07/21/2023   PLT 206 07/21/2023   NEUTROABS 2.9 07/21/2023    CMP  Lab Results  Component Value Date   NA 138 06/30/2023   K 4.5 06/30/2023   CL 104 06/30/2023   CO2 25 06/30/2023   GLUCOSE 142 (H) 06/30/2023   BUN 18 06/30/2023   CREATININE 0.75 06/30/2023   CALCIUM  9.6 06/30/2023   PROT 6.6 06/30/2023   ALBUMIN  3.8 06/30/2023   AST 28 06/30/2023   ALT 14 06/30/2023   ALKPHOS 54 06/30/2023   BILITOT 0.3 06/30/2023   GFRNONAA >60 06/30/2023    Lab Results  Component Value Date   CEA1 2.5 10/18/2021     Medications: I have reviewed the patient's current medications.   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  Bone lesions  concerning for metastases, right lung nodule Laparoscopy, diverting loop ileostomy, gastrostomy tube placement, peritoneal biopsy 10/16/2021, no evidence of a primary tumor site at the appendix, ovaries, or uterus.  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  CDX2, CK20, and GATA3 negative.  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 Foundation 1-MSS, tumor mutation burden 5, HRD positive-LOH score 34.1% 08/11/2021 CA125 818 CT chest 11/03/2021-10 mm right lower lobe nodule, scattered tiny pulmonary nodules, peritoneal carcinomatosis Cycle 1 Taxol /carboplatin  11/12/2021 Cycle 2 Taxol /carboplatin  12/03/2021 Cycle 3 Taxol /carboplatin  12/24/2021 Bone scan 01/05/2022-negative for metastatic disease Cycle 4 Taxol /carboplatin  01/13/2022 CTs 01/22/2022-reduction in peritoneal carcinomatosis, no evidence of progressive disease stable bilateral lower lobe pulmonary nodules Cycle 5 Taxol /carboplatin  02/03/2022 Cycle 6 Taxol /carboplatin  02/24/2022 CT abdomen/pelvis 04/19/2022-slight decrease in peritoneal carcinomatosis with residual lesions 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   Abdominal distention/pain and diarrhea secondary to #1 3.   Right breast cancer June 1999, stage Ia  (T1CN0), ER negative, PR negative, HER2 negative.  Lumpectomy and adjuvant CMF  x8 followed by radiation 4. Hypertension 5. Hypothyroidism 6. Barrett's esophagus 7. Family history of multiple cancers including appendix, colon, and lung cancer 8.  Hospital admission 11/05/2021 with dehydration/prerenal azotemia secondary to nausea and high output ileostomy 9.  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 Admission 05/29/2022 with dehydration and acute renal injury     Disposition: Ms. Wiegel appears stable.  The CA125 has been higher over the past few months.  There is no clinical evidence of disease progression.  The plan is to continue the current treatment.  She will be scheduled for a restaging CT after the next office visit. She will return for an office visit and bevacizumab  in 3 weeks.  Coni Deep, MD  07/21/2023  10:18 AM

## 2023-07-21 NOTE — Progress Notes (Signed)
 Patient seen by Dr. Thornton Papas today  Vitals are within treatment parameters:Yes   Labs are within treatment parameters: Yes   Treatment plan has been signed: Yes   Per physician team, Patient is ready for treatment and there are NO modifications to the treatment plan.

## 2023-07-21 NOTE — Patient Instructions (Signed)
 [facility] - A DEPT OF [facility]  Discharge Instructions: Thank you for choosing [facility] to provide your oncology and hematology care.   If you have a lab appointment with the Cancer Center, please go directly to the Cancer Center and check in at the registration area.   Wear comfortable clothing and clothing appropriate for easy access to any Portacath or PICC line.   We strive to give you quality time with your provider. You may need to reschedule your appointment if you arrive late (15 or more minutes).  Arriving late affects you and other patients whose appointments are after yours.  Also, if you miss three or more appointments without notifying the office, you may be dismissed from the clinic at the provider's discretion.      For prescription refill requests, have your pharmacy contact our office and allow 72 hours for refills to be completed.    Today you received the following chemotherapy and/or immunotherapy agents: bevacizumab -bvzr (ZIRABEV )       To help prevent nausea and vomiting after your treatment, we encourage you to take your nausea medication as directed.  BELOW ARE SYMPTOMS THAT SHOULD BE REPORTED IMMEDIATELY: *FEVER GREATER THAN 100.4 F (38 C) OR HIGHER *CHILLS OR SWEATING *NAUSEA AND VOMITING THAT IS NOT CONTROLLED WITH YOUR NAUSEA MEDICATION *UNUSUAL SHORTNESS OF BREATH *UNUSUAL BRUISING OR BLEEDING *URINARY PROBLEMS (pain or burning when urinating, or frequent urination) *BOWEL PROBLEMS (unusual diarrhea, constipation, pain near the anus) TENDERNESS IN MOUTH AND THROAT WITH OR WITHOUT PRESENCE OF ULCERS (sore throat, sores in mouth, or a toothache) UNUSUAL RASH, SWELLING OR PAIN  UNUSUAL VAGINAL DISCHARGE OR ITCHING   Items with * indicate a potential emergency and should be followed up as soon as possible or go to the Emergency Department if any problems should occur.  Please show the CHEMOTHERAPY ALERT CARD  or IMMUNOTHERAPY ALERT CARD at check-in to the Emergency Department and triage nurse.  Should you have questions after your visit or need to cancel or reschedule your appointment, please contact [facility] - A DEPT OF [facility]  Dept: [phone]  and follow the prompts.  Office hours are 8:00 a.m. to 4:30 p.m. Monday - Friday. Please note that voicemails left after 4:00 p.m. may not be returned until the following business day.  We are closed weekends and major holidays. You have access to a nurse at all times for urgent questions. Please call the main number to the clinic Dept: [phone] and follow the prompts.   For any non-urgent questions, you may also contact your provider using MyChart. We now offer e-Visits for anyone 48 and older to request care online for non-urgent symptoms. For details visit [email or web address].   Also download the MyChart app! Go to the app store, search "MyChart", open the app, select , and log in with your MyChart username and password.

## 2023-07-22 LAB — CA 125: Cancer Antigen (CA) 125: 96.7 U/mL — ABNORMAL HIGH (ref 0.0–38.1)

## 2023-07-23 ENCOUNTER — Other Ambulatory Visit: Payer: Self-pay

## 2023-07-31 ENCOUNTER — Other Ambulatory Visit: Payer: Self-pay

## 2023-08-09 DIAGNOSIS — Z932 Ileostomy status: Secondary | ICD-10-CM | POA: Diagnosis not present

## 2023-08-11 ENCOUNTER — Inpatient Hospital Stay: Attending: Oncology

## 2023-08-11 ENCOUNTER — Inpatient Hospital Stay

## 2023-08-11 ENCOUNTER — Encounter: Payer: Self-pay | Admitting: Nurse Practitioner

## 2023-08-11 ENCOUNTER — Inpatient Hospital Stay: Admitting: Nurse Practitioner

## 2023-08-11 VITALS — BP 142/72 | HR 97 | Temp 97.8°F | Resp 18 | Ht 59.0 in | Wt 123.0 lb

## 2023-08-11 DIAGNOSIS — Z5112 Encounter for antineoplastic immunotherapy: Secondary | ICD-10-CM | POA: Diagnosis not present

## 2023-08-11 DIAGNOSIS — Z95828 Presence of other vascular implants and grafts: Secondary | ICD-10-CM

## 2023-08-11 DIAGNOSIS — C481 Malignant neoplasm of specified parts of peritoneum: Secondary | ICD-10-CM | POA: Diagnosis not present

## 2023-08-11 LAB — CBC WITH DIFFERENTIAL (CANCER CENTER ONLY)
Abs Immature Granulocytes: 0.02 10*3/uL (ref 0.00–0.07)
Basophils Absolute: 0.1 10*3/uL (ref 0.0–0.1)
Basophils Relative: 1 %
Eosinophils Absolute: 0.2 10*3/uL (ref 0.0–0.5)
Eosinophils Relative: 4 %
HCT: 38.6 % (ref 36.0–46.0)
Hemoglobin: 12.8 g/dL (ref 12.0–15.0)
Immature Granulocytes: 0 %
Lymphocytes Relative: 19 %
Lymphs Abs: 1 10*3/uL (ref 0.7–4.0)
MCH: 36.8 pg — ABNORMAL HIGH (ref 26.0–34.0)
MCHC: 33.2 g/dL (ref 30.0–36.0)
MCV: 110.9 fL — ABNORMAL HIGH (ref 80.0–100.0)
Monocytes Absolute: 0.5 10*3/uL (ref 0.1–1.0)
Monocytes Relative: 10 %
Neutro Abs: 3.4 10*3/uL (ref 1.7–7.7)
Neutrophils Relative %: 66 %
Platelet Count: 229 10*3/uL (ref 150–400)
RBC: 3.48 MIL/uL — ABNORMAL LOW (ref 3.87–5.11)
RDW: 15 % (ref 11.5–15.5)
WBC Count: 5.2 10*3/uL (ref 4.0–10.5)
nRBC: 0 % (ref 0.0–0.2)

## 2023-08-11 LAB — CMP (CANCER CENTER ONLY)
ALT: 14 U/L (ref 0–44)
AST: 27 U/L (ref 15–41)
Albumin: 3.9 g/dL (ref 3.5–5.0)
Alkaline Phosphatase: 61 U/L (ref 38–126)
Anion gap: 10 (ref 5–15)
BUN: 14 mg/dL (ref 8–23)
CO2: 25 mmol/L (ref 22–32)
Calcium: 9.3 mg/dL (ref 8.9–10.3)
Chloride: 102 mmol/L (ref 98–111)
Creatinine: 0.66 mg/dL (ref 0.44–1.00)
GFR, Estimated: >60 mL/min (ref >60)
Glucose, Bld: 176 mg/dL — ABNORMAL HIGH (ref 70–99)
Potassium: 4.2 mmol/L (ref 3.5–5.1)
Sodium: 137 mmol/L (ref 135–145)
Total Bilirubin: 0.3 mg/dL (ref 0.0–1.2)
Total Protein: 6.5 g/dL (ref 6.5–8.1)

## 2023-08-11 LAB — TOTAL PROTEIN, URINE DIPSTICK

## 2023-08-11 MED ORDER — SODIUM CHLORIDE 0.9 % IV SOLN
15.0000 mg/kg | Freq: Once | INTRAVENOUS | Status: AC
  Administered 2023-08-11: 800 mg via INTRAVENOUS
  Filled 2023-08-11: qty 32

## 2023-08-11 MED ORDER — SODIUM CHLORIDE 0.9% FLUSH
10.0000 mL | Freq: Once | INTRAVENOUS | Status: AC
  Administered 2023-08-11: 10 mL via INTRAVENOUS

## 2023-08-11 MED ORDER — HEPARIN SOD (PORK) LOCK FLUSH 100 UNIT/ML IV SOLN
500.0000 [IU] | Freq: Once | INTRAVENOUS | Status: AC | PRN
  Administered 2023-08-11: 500 [IU]

## 2023-08-11 MED ORDER — SODIUM CHLORIDE 0.9% FLUSH
10.0000 mL | INTRAVENOUS | Status: DC | PRN
  Administered 2023-08-11: 10 mL

## 2023-08-11 MED ORDER — SODIUM CHLORIDE 0.9 % IV SOLN: Freq: Once | INTRAVENOUS | Status: AC

## 2023-08-11 NOTE — Progress Notes (Signed)
 [facility] OFFICE PROGRESS NOTE   Diagnosis: Peritoneal carcinoma  INTERVAL HISTORY:   [name] returns as scheduled.  She continues olaparib  and bevacizumab .  She denies nausea/vomiting.  No mouth sores.  No significant diarrhea.  No abdominal pain.  She denies bleeding.  No leg swelling or calf pain.  Appetite is stable.  Objective:  Vital signs in last 24 hours:  Blood pressure (!) 142/72, pulse 97, temperature 97.8 F (36.6 C), temperature source Temporal, resp. rate 18, height 4' 11 (1.499 m), weight 123 lb (55.8 kg), SpO2 98%.    HEENT: No thrush or ulcers. Resp: Lungs clear bilaterally. Cardio: Regular rate and rhythm. GI: No hepatosplenomegaly.  Right abdomen ileostomy. Vascular: No leg edema. Skin: No rash. Port-A-Cath without erythema.  Lab Results:  Lab Results  Component Value Date   WBC 5.2 08/11/2023   HGB 12.8 08/11/2023   HCT 38.6 08/11/2023   MCV 110.9 (H) 08/11/2023   PLT 229 08/11/2023   NEUTROABS 3.4 08/11/2023    Imaging:  No results found.  Medications: I have reviewed the patient's current medications.  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  Bone lesions concerning for metastases, right lung nodule Laparoscopy, diverting loop ileostomy, gastrostomy tube placement, peritoneal biopsy 10/16/2021, no evidence of a primary tumor site at the appendix, ovaries, or uterus.  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  CDX2, CK20, and GATA3 negative.  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 Foundation 1-MSS, tumor mutation burden 5, HRD positive-LOH score 34.1% 08/11/2021 CA125 818 CT chest 11/03/2021-10 mm right lower lobe nodule, scattered tiny pulmonary nodules, peritoneal carcinomatosis Cycle 1 Taxol /carboplatin  11/12/2021 Cycle 2 Taxol /carboplatin  12/03/2021 Cycle 3 Taxol /carboplatin  12/24/2021 Bone scan 01/05/2022-negative for metastatic disease Cycle 4 Taxol /carboplatin  01/13/2022 CTs 01/22/2022-reduction in peritoneal carcinomatosis, no evidence of progressive disease stable bilateral lower lobe pulmonary nodules Cycle 5 Taxol /carboplatin  02/03/2022 Cycle 6 Taxol /carboplatin  02/24/2022 CT abdomen/pelvis 04/19/2022-slight decrease in peritoneal carcinomatosis with residual lesions 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   Abdominal distention/pain and diarrhea secondary to #1 3.   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  Lumpectomy and adjuvant CMF x8 followed by radiation 4. Hypertension 5. Hypothyroidism 6. Barrett's esophagus 7. Family history of multiple cancers including appendix, colon, and lung cancer 8.  Hospital admission 11/05/2021 with dehydration/prerenal azotemia secondary to nausea and high output ileostomy 9.  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 Admission 05/29/2022 with dehydration and  acute renal injury      Disposition: [name] appears stable.  She continues olaparib  and bevacizumab .  There is no clinical evidence of disease progression.  CA125 is slowly rising.  Plan to proceed with treatment today as scheduled.  Restaging CTs prior  to next office visit.  CBC and chemistry panel reviewed.  Labs adequate for treatment.  She will return for follow-up and treatment in 3 weeks.  Restaging CTs prior to that visit.  We are available to see her in the interim with any problems.    Diana Forster ANP/GNP-BC   08/11/2023  10:59 AM

## 2023-08-11 NOTE — Progress Notes (Signed)
 Patient seen by Diana Forster NP today  Vitals are within treatment parameters:Yes   Labs are within treatment parameters: Yes  Urine: Negative Treatment plan has been signed: Yes   Per physician team, Patient is ready for treatment and there are NO modifications to the treatment plan.

## 2023-08-11 NOTE — Patient Instructions (Signed)
 [facility] - A DEPT OF [facility]  Discharge Instructions: Thank you for choosing [facility] to provide your oncology and hematology care.   If you have a lab appointment with the Cancer Center, please go directly to the Cancer Center and check in at the registration area.   Wear comfortable clothing and clothing appropriate for easy access to any Portacath or PICC line.   We strive to give you quality time with your provider. You may need to reschedule your appointment if you arrive late (15 or more minutes).  Arriving late affects you and other patients whose appointments are after yours.  Also, if you miss three or more appointments without notifying the office, you may be dismissed from the clinic at the provider's discretion.      For prescription refill requests, have your pharmacy contact our office and allow 72 hours for refills to be completed.    Today you received the following chemotherapy and/or immunotherapy agents: bevacizumab -bvzr (ZIRABEV )       To help prevent nausea and vomiting after your treatment, we encourage you to take your nausea medication as directed.  BELOW ARE SYMPTOMS THAT SHOULD BE REPORTED IMMEDIATELY: *FEVER GREATER THAN 100.4 F (38 C) OR HIGHER *CHILLS OR SWEATING *NAUSEA AND VOMITING THAT IS NOT CONTROLLED WITH YOUR NAUSEA MEDICATION *UNUSUAL SHORTNESS OF BREATH *UNUSUAL BRUISING OR BLEEDING *URINARY PROBLEMS (pain or burning when urinating, or frequent urination) *BOWEL PROBLEMS (unusual diarrhea, constipation, pain near the anus) TENDERNESS IN MOUTH AND THROAT WITH OR WITHOUT PRESENCE OF ULCERS (sore throat, sores in mouth, or a toothache) UNUSUAL RASH, SWELLING OR PAIN  UNUSUAL VAGINAL DISCHARGE OR ITCHING   Items with * indicate a potential emergency and should be followed up as soon as possible or go to the Emergency Department if any problems should occur.  Please show the CHEMOTHERAPY ALERT CARD  or IMMUNOTHERAPY ALERT CARD at check-in to the Emergency Department and triage nurse.  Should you have questions after your visit or need to cancel or reschedule your appointment, please contact [facility] - A DEPT OF [facility]  Dept: [phone]  and follow the prompts.  Office hours are 8:00 a.m. to 4:30 p.m. Monday - Friday. Please note that voicemails left after 4:00 p.m. may not be returned until the following business day.  We are closed weekends and major holidays. You have access to a nurse at all times for urgent questions. Please call the main number to the clinic Dept: [phone] and follow the prompts.   For any non-urgent questions, you may also contact your provider using MyChart. We now offer e-Visits for anyone 48 and older to request care online for non-urgent symptoms. For details visit [email or web address].   Also download the MyChart app! Go to the app store, search "MyChart", open the app, select , and log in with your MyChart username and password.

## 2023-08-11 NOTE — Patient Instructions (Signed)

## 2023-08-12 LAB — CA 125: Cancer Antigen (CA) 125: 142 U/mL — ABNORMAL HIGH (ref 0.0–38.1)

## 2023-08-15 ENCOUNTER — Other Ambulatory Visit: Payer: Self-pay

## 2023-08-20 ENCOUNTER — Other Ambulatory Visit: Payer: Self-pay

## 2023-08-21 ENCOUNTER — Other Ambulatory Visit: Payer: Self-pay

## 2023-08-22 ENCOUNTER — Other Ambulatory Visit: Payer: Self-pay

## 2023-08-23 ENCOUNTER — Other Ambulatory Visit: Payer: Self-pay

## 2023-08-25 ENCOUNTER — Other Ambulatory Visit: Payer: Self-pay

## 2023-08-25 ENCOUNTER — Other Ambulatory Visit (HOSPITAL_COMMUNITY): Payer: Self-pay

## 2023-08-25 ENCOUNTER — Other Ambulatory Visit: Payer: Self-pay | Admitting: *Deleted

## 2023-08-25 DIAGNOSIS — C481 Malignant neoplasm of specified parts of peritoneum: Secondary | ICD-10-CM

## 2023-08-25 MED ORDER — OLAPARIB 150 MG PO TABS
150.0000 mg | ORAL_TABLET | Freq: Two times a day (BID) | ORAL | 2 refills | Status: DC
  Filled 2023-08-25 – 2023-09-01 (×2): qty 60, 30d supply, fill #0

## 2023-08-25 MED ORDER — OLAPARIB 150 MG PO TABS
150.0000 mg | ORAL_TABLET | Freq: Two times a day (BID) | ORAL | 2 refills | Status: DC
  Filled 2023-08-25: qty 60, 30d supply, fill #0

## 2023-08-25 NOTE — Telephone Encounter (Signed)
 Patient reports she is getting low on her olaparib  now. Refill sent to [facility].

## 2023-08-27 ENCOUNTER — Ambulatory Visit (HOSPITAL_BASED_OUTPATIENT_CLINIC_OR_DEPARTMENT_OTHER)
Admission: RE | Admit: 2023-08-27 | Discharge: 2023-08-27 | Disposition: A | Discharge status: Home / Self Care | Source: Ambulatory Visit | Attending: Nurse Practitioner | Admitting: Nurse Practitioner

## 2023-08-27 DIAGNOSIS — K439 Ventral hernia without obstruction or gangrene: Secondary | ICD-10-CM | POA: Diagnosis not present

## 2023-08-27 DIAGNOSIS — C481 Malignant neoplasm of specified parts of peritoneum: Secondary | ICD-10-CM | POA: Insufficient documentation

## 2023-08-27 DIAGNOSIS — N1330 Unspecified hydronephrosis: Secondary | ICD-10-CM | POA: Diagnosis not present

## 2023-08-27 DIAGNOSIS — C786 Secondary malignant neoplasm of retroperitoneum and peritoneum: Secondary | ICD-10-CM | POA: Diagnosis not present

## 2023-08-27 MED ORDER — IOHEXOL 300 MG/ML  SOLN
75.0000 mL | Freq: Once | INTRAMUSCULAR | Status: AC | PRN
  Administered 2023-08-27: 75 mL via INTRAVENOUS

## 2023-08-27 MED ORDER — HEPARIN SOD (PORK) LOCK FLUSH 100 UNIT/ML IV SOLN: 500.0000 [IU] | Freq: Once | INTRAVENOUS | Status: DC

## 2023-08-28 ENCOUNTER — Other Ambulatory Visit: Payer: Self-pay | Admitting: Oncology

## 2023-09-01 ENCOUNTER — Inpatient Hospital Stay: Attending: Oncology | Admitting: Oncology

## 2023-09-01 ENCOUNTER — Other Ambulatory Visit (HOSPITAL_COMMUNITY): Payer: Self-pay

## 2023-09-01 ENCOUNTER — Other Ambulatory Visit: Payer: Self-pay

## 2023-09-01 ENCOUNTER — Inpatient Hospital Stay

## 2023-09-01 ENCOUNTER — Inpatient Hospital Stay: Attending: Oncology

## 2023-09-01 VITALS — BP 139/81 | HR 100 | Temp 97.8°F | Resp 18 | Ht 59.0 in | Wt 122.1 lb

## 2023-09-01 DIAGNOSIS — Z5111 Encounter for antineoplastic chemotherapy: Secondary | ICD-10-CM | POA: Diagnosis present

## 2023-09-01 DIAGNOSIS — C481 Malignant neoplasm of specified parts of peritoneum: Secondary | ICD-10-CM

## 2023-09-01 DIAGNOSIS — Z95828 Presence of other vascular implants and grafts: Secondary | ICD-10-CM | POA: Diagnosis not present

## 2023-09-01 DIAGNOSIS — Z5189 Encounter for other specified aftercare: Secondary | ICD-10-CM | POA: Insufficient documentation

## 2023-09-01 DIAGNOSIS — N1330 Unspecified hydronephrosis: Secondary | ICD-10-CM | POA: Diagnosis not present

## 2023-09-01 LAB — CMP (CANCER CENTER ONLY)
ALT: 15 U/L (ref 0–44)
AST: 30 U/L (ref 15–41)
Albumin: 4.2 g/dL (ref 3.5–5.0)
Alkaline Phosphatase: 67 U/L (ref 38–126)
Anion gap: 11 (ref 5–15)
BUN: 22 mg/dL (ref 8–23)
CO2: 26 mmol/L (ref 22–32)
Calcium: 10 mg/dL (ref 8.9–10.3)
Chloride: 103 mmol/L (ref 98–111)
Creatinine: 0.86 mg/dL (ref 0.44–1.00)
GFR, Estimated: >60 mL/min (ref >60)
Glucose, Bld: 109 mg/dL — ABNORMAL HIGH (ref 70–99)
Potassium: 4.4 mmol/L (ref 3.5–5.1)
Sodium: 140 mmol/L (ref 135–145)
Total Bilirubin: 0.6 mg/dL (ref 0.0–1.2)
Total Protein: 7.6 g/dL (ref 6.5–8.1)

## 2023-09-01 LAB — CBC WITH DIFFERENTIAL (CANCER CENTER ONLY)
Abs Immature Granulocytes: 0.02 K/uL (ref 0.00–0.07)
Basophils Absolute: 0.1 K/uL (ref 0.0–0.1)
Basophils Relative: 1 %
Eosinophils Absolute: 0.3 K/uL (ref 0.0–0.5)
Eosinophils Relative: 5 %
HCT: 40 % (ref 36.0–46.0)
Hemoglobin: 13.3 g/dL (ref 12.0–15.0)
Immature Granulocytes: 0 %
Lymphocytes Relative: 24 %
Lymphs Abs: 1.3 K/uL (ref 0.7–4.0)
MCH: 36.8 pg — ABNORMAL HIGH (ref 26.0–34.0)
MCHC: 33.3 g/dL (ref 30.0–36.0)
MCV: 110.8 fL — ABNORMAL HIGH (ref 80.0–100.0)
Monocytes Absolute: 0.6 K/uL (ref 0.1–1.0)
Monocytes Relative: 11 %
Neutro Abs: 3.2 K/uL (ref 1.7–7.7)
Neutrophils Relative %: 59 %
Platelet Count: 268 K/uL (ref 150–400)
RBC: 3.61 MIL/uL — ABNORMAL LOW (ref 3.87–5.11)
RDW: 14.5 % (ref 11.5–15.5)
WBC Count: 5.4 K/uL (ref 4.0–10.5)
nRBC: 0 % (ref 0.0–0.2)

## 2023-09-01 LAB — TOTAL PROTEIN, URINE DIPSTICK: Protein, ur: 30 mg/dL — AB

## 2023-09-01 MED ORDER — HEPARIN SOD (PORK) LOCK FLUSH 100 UNIT/ML IV SOLN
500.0000 [IU] | Freq: Once | INTRAVENOUS | Status: AC
  Administered 2023-09-01: 500 [IU] via INTRAVENOUS

## 2023-09-01 MED ORDER — SODIUM CHLORIDE 0.9% FLUSH
10.0000 mL | INTRAVENOUS | Status: DC | PRN
  Administered 2023-09-01: 10 mL via INTRAVENOUS

## 2023-09-01 NOTE — Progress Notes (Signed)
 Specialty Pharmacy Refill Coordination Note  Nancy Harrison is a 83 y.o. female contacted today regarding refills of specialty medication(s) Olaparib  ([name] )   Patient requested Delivery   Delivery date: 09/06/23   Verified address: [address] [place] [address]   Medication will be filled on 09/05/23.

## 2023-09-01 NOTE — Progress Notes (Signed)
 Specialty Pharmacy Ongoing Clinical Assessment Note  Nancy Harrison is a 83 y.o. female who is being followed by the specialty pharmacy service for RxSp Oncology   Patient's specialty medication(s) reviewed today: Olaparib  ([name] )   Missed doses in the last 4 weeks: 0   Patient/Caregiver did not have any additional questions or concerns.   Therapeutic benefit summary: Patient is achieving benefit   Adverse events/side effects summary: No adverse events/side effects   Patient's therapy is appropriate to: Continue    Goals Addressed             This Visit's Progress    Slow Disease Progression       Patient is on track. Patient will maintain adherence. Per 08/11/23 visit notes CA125 slowly rising. Restaging CT completed but not yet documented in visit. Nurse initiated the call to pharmacy today to continue therapy as prescribed.          Follow up: 3 months  Nancy Harrison Sites Specialty Pharmacist

## 2023-09-01 NOTE — Progress Notes (Signed)
 [facility] OFFICE PROGRESS NOTE   Diagnosis: Peritoneal cancer  INTERVAL HISTORY:   Nancy Harrison returns as scheduled.  She feels well.  She continues olaparib .  She was last treated with bevacizumab  on 08/11/2023.  No bleeding or symptom of thrombosis.  She empties the ileostomy approximately 3 times daily.  She takes Imodium  and Lomotil .  No new complaint.  Objective:  Vital signs in last 24 hours:  Blood pressure 139/81, pulse 100, temperature 97.8 F (36.6 C), temperature source Temporal, resp. rate 18, height 4' 11 (1.499 m), weight 122 lb 1.6 oz (55.4 kg), SpO2 96%.    HEENT: No thrush Resp: Lungs clear bilaterally Cardio: Regular rate and rhythm GI: Right abdomen ileostomy, no hepatosplenomegaly, nontender Vascular: No leg edema  Portacath/PICC-without erythema  Lab Results:  Lab Results  Component Value Date   WBC 5.4 09/01/2023   HGB 13.3 09/01/2023   HCT 40.0 09/01/2023   MCV 110.8 (H) 09/01/2023   PLT 268 09/01/2023   NEUTROABS 3.2 09/01/2023    CMP  Lab Results  Component Value Date   NA 137 08/11/2023   K 4.2 08/11/2023   CL 102 08/11/2023   CO2 25 08/11/2023   GLUCOSE 176 (H) 08/11/2023   BUN 14 08/11/2023   CREATININE 0.66 08/11/2023   CALCIUM  9.3 08/11/2023   PROT 6.5 08/11/2023   ALBUMIN  3.9 08/11/2023   AST 27 08/11/2023   ALT 14 08/11/2023   ALKPHOS 61 08/11/2023   BILITOT 0.3 08/11/2023   GFRNONAA >60 08/11/2023    Lab Results  Component Value Date   CEA1 2.5 10/18/2021    Medications: I have reviewed the patient's current medications.   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  Bone lesions concerning for metastases,  right lung nodule Laparoscopy, diverting loop ileostomy, gastrostomy tube placement, peritoneal biopsy 10/16/2021, no evidence of a primary tumor site at the appendix, ovaries, or uterus.  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  CDX2, CK20, and GATA3 negative.  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 Foundation 1-MSS, tumor mutation burden 5, HRD positive-LOH score 34.1% 08/11/2021 CA125 818 CT chest 11/03/2021-10 mm right lower lobe nodule, scattered tiny pulmonary nodules, peritoneal carcinomatosis Cycle 1 Taxol /carboplatin  11/12/2021 Cycle 2 Taxol /carboplatin  12/03/2021 Cycle 3 Taxol /carboplatin  12/24/2021 Bone scan 01/05/2022-negative for metastatic disease Cycle 4 Taxol /carboplatin  01/13/2022 CTs 01/22/2022-reduction in peritoneal carcinomatosis, no evidence of progressive disease stable bilateral lower lobe pulmonary nodules Cycle 5 Taxol /carboplatin  02/03/2022 Cycle 6 Taxol /carboplatin  02/24/2022 CT abdomen/pelvis 04/19/2022-slight decrease in peritoneal carcinomatosis with residual lesions 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 Olaparib  05/14/2022 Bevacizumab  every 3 weeks 05/19/2022 Olaparib  held on hospital admission 05/29/2022 Olaparib  resumed 06/09/2022, dose reduced to 150 mg twice daily CT abdomen/pelvis 09/06/2022-decrease in size and conspicuity of peritoneal carcinomatosis, unchanged left adrenal nodule Olaparib /bevacizumab  continued CT abdomen/pelvis 03/16/2023: No progressive disease, stable ill-defined soft tissue in the gastrocolic ligament, stable right lower lobe nodule Olaparib /bevacizumab  continued CT abdomen/pelvis 08/27/2023: New right hydroureteronephrosis to the level of the pelvic inlet/13 mm enhancing nodule, soft tissue density at  the gastrocolic ligament measures smaller in a previously identified nodule adjacent to this is not identified, left  adrenal nodule is stable, stable right lower lobe nodule 2.   Abdominal distention/pain and diarrhea secondary to #1 3.   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  Lumpectomy and adjuvant CMF x8 followed by radiation 4. Hypertension 5. Hypothyroidism 6. Barrett's esophagus 7. Family history of multiple cancers including appendix, colon, and lung cancer 8.  Hospital admission 11/05/2021 with dehydration/prerenal azotemia secondary to nausea and high output ileostomy 9.  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 Admission 05/29/2022 with dehydration and acute renal injury       Disposition: Nancy Harrison appears unchanged.  I reviewed the restaging CT findings and images with her.  The CA125 has been higher over the past several months.  The CT is consistent with the disease progression causing new right hydronephrosis.  Nancy Harrison agrees to a urology referral to consider placement of a right ureter stent.  The stent placement is recommended to preserve renal function and prevent infection.  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  She was last treated with Taxol /carboplatin  in January 2024.  I recommend resuming treatment with the Taxol /carboplatin  regimen.  We reviewed potential toxicities associated with repeat Taxol /carboplatin  including the chance of neuropathy and an allergic reaction.  She agrees to proceed.  The plan is to begin treatment with Taxol /carboplatin  next week.  A treatment plan was entered today.  Arley Hof, MD  09/01/2023  11:05 AM

## 2023-09-01 NOTE — Progress Notes (Signed)
 DISCONTINUE OFF PATHWAY REGIMEN - Other   [id number]:Bevacizumab  15 mg/kg IV D1 q21 Days:   A cycle is every 21 days:     Bevacizumab -xxxx   **Always confirm dose/schedule in your pharmacy ordering system**  PRIOR TREATMENT: Bevacizumab  15 mg/kg IV D1 q21 Days  START OFF PATHWAY REGIMEN - Other   [id number]:$[id number]cKWRegMW$$MzfnczAzqnmzIZPI_dXPFRBPDzBqvCPelWGbtXBMlcKWRegMW$  AUC=5 IV D1 + Paclitaxel  175 mg/m2 IV D1 q21 Days:   A cycle is every 21 days:     Paclitaxel       Carboplatin    **Always confirm dose/schedule in your pharmacy ordering system**  Patient Characteristics: Intent of Therapy: Non-Curative / Palliative Intent, Discussed with Patient

## 2023-09-01 NOTE — Progress Notes (Signed)
 Per Devere, RN, Dr. Cloretta is discontinuing the [name]  and transitioning to IV chemotherapy due to disease progression.  Disenrolling from Specialty Pharmacy services at this time.

## 2023-09-07 DIAGNOSIS — N130 Hydronephrosis with ureteropelvic junction obstruction: Secondary | ICD-10-CM | POA: Diagnosis not present

## 2023-09-10 ENCOUNTER — Other Ambulatory Visit: Payer: Self-pay | Admitting: Oncology

## 2023-09-11 ENCOUNTER — Other Ambulatory Visit: Payer: Self-pay

## 2023-09-12 DIAGNOSIS — Z932 Ileostomy status: Secondary | ICD-10-CM | POA: Diagnosis not present

## 2023-09-14 ENCOUNTER — Other Ambulatory Visit: Payer: Self-pay | Admitting: Oncology

## 2023-09-16 ENCOUNTER — Inpatient Hospital Stay

## 2023-09-16 ENCOUNTER — Other Ambulatory Visit: Payer: Self-pay | Admitting: *Deleted

## 2023-09-16 ENCOUNTER — Inpatient Hospital Stay: Admitting: Nurse Practitioner

## 2023-09-16 VITALS — BP 124/79 | HR 100 | Temp 97.8°F | Resp 18 | Ht 59.0 in | Wt 123.2 lb

## 2023-09-16 VITALS — BP 151/84 | HR 94 | Temp 97.9°F | Resp 18

## 2023-09-16 DIAGNOSIS — C481 Malignant neoplasm of specified parts of peritoneum: Secondary | ICD-10-CM

## 2023-09-16 DIAGNOSIS — Z95828 Presence of other vascular implants and grafts: Secondary | ICD-10-CM

## 2023-09-16 DIAGNOSIS — Z5111 Encounter for antineoplastic chemotherapy: Secondary | ICD-10-CM | POA: Diagnosis not present

## 2023-09-16 LAB — CBC WITH DIFFERENTIAL (CANCER CENTER ONLY)
Abs Immature Granulocytes: 0.01 K/uL (ref 0.00–0.07)
Basophils Absolute: 0.1 K/uL (ref 0.0–0.1)
Basophils Relative: 1 %
Eosinophils Absolute: 0.3 K/uL (ref 0.0–0.5)
Eosinophils Relative: 7 %
HCT: 39.4 % (ref 36.0–46.0)
Hemoglobin: 13.2 g/dL (ref 12.0–15.0)
Immature Granulocytes: 0 %
Lymphocytes Relative: 22 %
Lymphs Abs: 1.1 K/uL (ref 0.7–4.0)
MCH: 37.2 pg — ABNORMAL HIGH (ref 26.0–34.0)
MCHC: 33.5 g/dL (ref 30.0–36.0)
MCV: 111 fL — ABNORMAL HIGH (ref 80.0–100.0)
Monocytes Absolute: 0.5 K/uL (ref 0.1–1.0)
Monocytes Relative: 11 %
Neutro Abs: 3 K/uL (ref 1.7–7.7)
Neutrophils Relative %: 59 %
Platelet Count: 238 K/uL (ref 150–400)
RBC: 3.55 MIL/uL — ABNORMAL LOW (ref 3.87–5.11)
RDW: 13.6 % (ref 11.5–15.5)
WBC Count: 5 K/uL (ref 4.0–10.5)
nRBC: 0 % (ref 0.0–0.2)

## 2023-09-16 LAB — CMP (CANCER CENTER ONLY)
ALT: 13 U/L (ref 0–44)
AST: 28 U/L (ref 15–41)
Albumin: 4.1 g/dL (ref 3.5–5.0)
Alkaline Phosphatase: 68 U/L (ref 38–126)
Anion gap: 12 (ref 5–15)
BUN: 16 mg/dL (ref 8–23)
CO2: 27 mmol/L (ref 22–32)
Calcium: 10 mg/dL (ref 8.9–10.3)
Chloride: 102 mmol/L (ref 98–111)
Creatinine: 0.88 mg/dL (ref 0.44–1.00)
GFR, Estimated: >60 mL/min (ref >60)
Glucose, Bld: 103 mg/dL — ABNORMAL HIGH (ref 70–99)
Potassium: 4.1 mmol/L (ref 3.5–5.1)
Sodium: 140 mmol/L (ref 135–145)
Total Bilirubin: 0.5 mg/dL (ref 0.0–1.2)
Total Protein: 7.4 g/dL (ref 6.5–8.1)

## 2023-09-16 MED ORDER — SODIUM CHLORIDE 0.9 % IV SOLN: INTRAVENOUS | Status: DC

## 2023-09-16 MED ORDER — DIPHENHYDRAMINE HCL 50 MG/ML IJ SOLN
25.0000 mg | Freq: Once | INTRAMUSCULAR | Status: AC
  Administered 2023-09-16: 25 mg via INTRAVENOUS
  Filled 2023-09-16: qty 1

## 2023-09-16 MED ORDER — SODIUM CHLORIDE 0.9 % IV SOLN
321.0000 mg | Freq: Once | INTRAVENOUS | Status: AC
  Administered 2023-09-16: 320 mg via INTRAVENOUS
  Filled 2023-09-16: qty 32

## 2023-09-16 MED ORDER — PALONOSETRON HCL INJECTION 0.25 MG/5ML
0.2500 mg | Freq: Once | INTRAVENOUS | Status: AC
  Administered 2023-09-16: 0.25 mg via INTRAVENOUS
  Filled 2023-09-16: qty 5

## 2023-09-16 MED ORDER — SODIUM CHLORIDE 0.9 % IV SOLN
Freq: Once | INTRAVENOUS | Status: DC
  Filled 2023-09-16: qty 5

## 2023-09-16 MED ORDER — HEPARIN SOD (PORK) LOCK FLUSH 100 UNIT/ML IV SOLN
500.0000 [IU] | Freq: Once | INTRAVENOUS | Status: AC
  Administered 2023-09-16: 500 [IU] via INTRAVENOUS

## 2023-09-16 MED ORDER — DEXAMETHASONE SODIUM PHOSPHATE 10 MG/ML IJ SOLN
10.0000 mg | Freq: Once | INTRAMUSCULAR | Status: AC
  Administered 2023-09-16: 10 mg via INTRAVENOUS
  Filled 2023-09-16: qty 1

## 2023-09-16 MED ORDER — FAMOTIDINE IN NACL 20-0.9 MG/50ML-% IV SOLN
20.0000 mg | Freq: Once | INTRAVENOUS | Status: AC
  Administered 2023-09-16: 20 mg via INTRAVENOUS
  Filled 2023-09-16: qty 50

## 2023-09-16 MED ORDER — SODIUM CHLORIDE 0.9% FLUSH
10.0000 mL | Freq: Once | INTRAVENOUS | Status: AC
  Administered 2023-09-16: 10 mL via INTRAVENOUS

## 2023-09-16 MED ORDER — ONDANSETRON HCL 4 MG PO TABS: 4.0000 mg | ORAL_TABLET | Freq: Three times a day (TID) | ORAL | 1 refills | Status: DC | PRN

## 2023-09-16 MED ORDER — PROCHLORPERAZINE MALEATE 5 MG PO TABS: 5.0000 mg | ORAL_TABLET | Freq: Four times a day (QID) | ORAL | 1 refills | Status: DC | PRN

## 2023-09-16 MED ORDER — SODIUM CHLORIDE 0.9 % IV SOLN
150.0000 mg | Freq: Once | INTRAVENOUS | Status: AC
  Administered 2023-09-16: 150 mg via INTRAVENOUS
  Filled 2023-09-16: qty 150

## 2023-09-16 MED ORDER — SODIUM CHLORIDE 0.9 % IV SOLN
175.0000 mg/m2 | Freq: Once | INTRAVENOUS | Status: AC
  Administered 2023-09-16: 264 mg via INTRAVENOUS
  Filled 2023-09-16: qty 44

## 2023-09-16 NOTE — Progress Notes (Signed)
 [facility] OFFICE PROGRESS NOTE   Diagnosis: Peritoneal cancer  INTERVAL HISTORY:   Ms. Schrade returns as scheduled.  She is scheduled to begin treatment today with Taxol /carboplatin .  She denies nausea/vomiting.  No diarrhea.  No abdominal pain.  She describes her appetite as fair.  No numbness or tingling in the hands or feet.  Objective:  Vital signs in last 24 hours:  Blood pressure 124/79, pulse 100, temperature 97.8 F (36.6 C), temperature source Temporal, resp. rate 18, height 4' 11 (1.499 m), weight 123 lb 3.2 oz (55.9 kg), SpO2 98%.    HEENT: No thrush or ulcers. Resp: Lungs clear bilaterally. Cardio: Regular rate and rhythm. GI: Abdomen soft and nontender.  No hepatosplenomegaly.  Right abdomen ileostomy. Vascular: No leg edema. Neuro: Alert and oriented. Skin: No rash. Port-A-Cath without erythema.  Lab Results:  Lab Results  Component Value Date   WBC 5.0 09/16/2023   HGB 13.2 09/16/2023   HCT 39.4 09/16/2023   MCV 111.0 (H) 09/16/2023   PLT 238 09/16/2023   NEUTROABS 3.0 09/16/2023    Imaging:  No results found.  Medications: I have reviewed the patient's current medications.  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  Bone lesions concerning for metastases, right lung nodule Laparoscopy, diverting loop ileostomy, gastrostomy tube placement, peritoneal biopsy 10/16/2021, no evidence of a primary tumor site at the appendix, ovaries, or uterus.  Diffuse peritoneal carcinomatosis Pathology of the lesser curvature of stomach carcinomatosis biopsy-metastatic poorly differentiated carcinoma, CK7, PAX8, WT1, and p53 positive with focal labeling for  p16.  CDX2, CK20, and GATA3 negative.  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 Foundation 1-MSS, tumor mutation burden 5, HRD positive-LOH score 34.1% 08/11/2021 CA125 818 CT chest 11/03/2021-10 mm right lower lobe nodule, scattered tiny pulmonary nodules, peritoneal carcinomatosis Cycle 1 Taxol /carboplatin  11/12/2021 Cycle 2 Taxol /carboplatin  12/03/2021 Cycle 3 Taxol /carboplatin  12/24/2021 Bone scan 01/05/2022-negative for metastatic disease Cycle 4 Taxol /carboplatin  01/13/2022 CTs 01/22/2022-reduction in peritoneal carcinomatosis, no evidence of progressive disease stable bilateral lower lobe pulmonary nodules Cycle 5 Taxol /carboplatin  02/03/2022 Cycle 6 Taxol /carboplatin  02/24/2022 CT abdomen/pelvis 04/19/2022-slight decrease in peritoneal carcinomatosis with residual lesions 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 Olaparib  05/14/2022 Bevacizumab  every 3 weeks 05/19/2022 Olaparib  held on hospital admission 05/29/2022 Olaparib  resumed 06/09/2022, dose reduced to 150 mg twice daily CT abdomen/pelvis 09/06/2022-decrease in size and conspicuity of peritoneal carcinomatosis, unchanged left adrenal nodule Olaparib /bevacizumab  continued CT abdomen/pelvis 03/16/2023: No progressive disease, stable ill-defined soft tissue in the gastrocolic ligament, stable right lower lobe nodule Olaparib /bevacizumab  continued CT abdomen/pelvis 08/27/2023: New right hydroureteronephrosis to the level of the pelvic inlet/13 mm enhancing nodule, soft tissue density at the gastrocolic ligament measures smaller in a previously identified nodule adjacent to this is not identified, left adrenal nodule is stable, stable right lower lobe nodule Olaparib /bevacizumab  discontinued Cycle 1 Taxol /carboplatin  09/16/2023 2.   Abdominal distention/pain and diarrhea secondary to #1 3.   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  Lumpectomy and adjuvant CMF x8 followed by radiation 4. Hypertension 5. Hypothyroidism 6. Barrett's esophagus 7. Family history of multiple cancers including appendix, colon, and lung cancer 8.  Hospital admission 11/05/2021 with dehydration/prerenal azotemia secondary to nausea and high output ileostomy 9.  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 Admission 05/29/2022 with dehydration and acute renal injury     Disposition: Ms. Steffy appears stable.  She is seen prior to beginning treatment with Taxol /carboplatin .  We again reviewed potential toxicities.  We discussed G-CSF support and reviewed potential side effects including bone pain, rash, splenic rupture.  She agrees to proceed.  CBC and chemistry panel reviewed.  Labs adequate for treatment.  We will contact urology regarding the plan for ureter stent placement.  She will return for lab and follow-up on 09/27/2023.  We are available to see her sooner if needed.  Olam Ned ANP/GNP-BC   09/16/2023  9:10 AM

## 2023-09-16 NOTE — Patient Instructions (Signed)

## 2023-09-16 NOTE — Patient Instructions (Signed)
 [facility] - A DEPT OF [facility]  Discharge Instructions: Thank you for choosing [facility] to provide your oncology and hematology care.   If you have a lab appointment with the Cancer Center, please go directly to the Cancer Center and check in at the registration area.   Wear comfortable clothing and clothing appropriate for easy access to any Portacath or PICC line.   We strive to give you quality time with your provider. You may need to reschedule your appointment if you arrive late (15 or more minutes).  Arriving late affects you and other patients whose appointments are after yours.  Also, if you miss three or more appointments without notifying the office, you may be dismissed from the clinic at the provider's discretion.      For prescription refill requests, have your pharmacy contact our office and allow 72 hours for refills to be completed.    Today you received the following chemotherapy and/or immunotherapy agents: Taxol  and carboplatin .     To help prevent nausea and vomiting after your treatment, we encourage you to take your nausea medication as directed.  BELOW ARE SYMPTOMS THAT SHOULD BE REPORTED IMMEDIATELY: *FEVER GREATER THAN 100.4 F (38 C) OR HIGHER *CHILLS OR SWEATING *NAUSEA AND VOMITING THAT IS NOT CONTROLLED WITH YOUR NAUSEA MEDICATION *UNUSUAL SHORTNESS OF BREATH *UNUSUAL BRUISING OR BLEEDING *URINARY PROBLEMS (pain or burning when urinating, or frequent urination) *BOWEL PROBLEMS (unusual diarrhea, constipation, pain near the anus) TENDERNESS IN MOUTH AND THROAT WITH OR WITHOUT PRESENCE OF ULCERS (sore throat, sores in mouth, or a toothache) UNUSUAL RASH, SWELLING OR PAIN  UNUSUAL VAGINAL DISCHARGE OR ITCHING   Items with * indicate a potential emergency and should be followed up as soon as possible or go to the Emergency Department if any problems should occur.  Please show the CHEMOTHERAPY ALERT CARD or  IMMUNOTHERAPY ALERT CARD at check-in to the Emergency Department and triage nurse.  Should you have questions after your visit or need to cancel or reschedule your appointment, please contact [facility] - A DEPT OF [facility]  Dept: [phone]  and follow the prompts.  Office hours are 8:00 a.m. to 4:30 p.m. Monday - Friday. Please note that voicemails left after 4:00 p.m. may not be returned until the following business day.  We are closed weekends and major holidays. You have access to a nurse at all times for urgent questions. Please call the main number to the clinic Dept: [phone] and follow the prompts.   For any non-urgent questions, you may also contact your provider using MyChart. We now offer e-Visits for anyone 13 and older to request care online for non-urgent symptoms. For details visit [email or web address].   Also download the MyChart app! Go to the app store, search MyChart, open the app, select [facility], and log in with your MyChart username and password. ________________________________________________________ Paclitaxel  Injection What is this medication? PACLITAXEL  (PAK li TAX el) treats some types of cancer. It works by slowing down the growth of cancer cells. This medicine may be used for other purposes; ask your health care provider or pharmacist if you have questions. COMMON BRAND NAME(S): Onxol, Taxol  What should I tell my care team before I take this medication? They need to know if you have any of these conditions: Heart disease Liver disease Low white blood cell levels An unusual or allergic reaction to paclitaxel , other medications, foods, dyes, or preservatives  If you or your partner are pregnant or trying to get pregnant Breast-feeding How should I use this medication? This medication is injected into a vein. It is given by your care team in a hospital or clinic setting. Talk to your care team about the use of this  medication in children. While it may be given to children for selected conditions, precautions do apply. Overdosage: If you think you have taken too much of this medicine contact a poison control center or emergency room at once. NOTE: This medicine is only for you. Do not share this medicine with others. What if I miss a dose? Keep appointments for follow-up doses. It is important not to miss your dose. Call your care team if you are unable to keep an appointment. What may interact with this medication? Do not take this medication with any of the following: Live virus vaccines Other medications may affect the way this medication works. Talk with your care team about all of the medications you take. They may suggest changes to your treatment plan to lower the risk of side effects and to make sure your medications work as intended. This list may not describe all possible interactions. Give your health care provider a list of all the medicines, herbs, non-prescription drugs, or dietary supplements you use. Also tell them if you smoke, drink alcohol, or use illegal drugs. Some items may interact with your medicine. What should I watch for while using this medication? Your condition will be monitored carefully while you are receiving this medication. You may need blood work while taking this medication. This medication may make you feel generally unwell. This is not uncommon as chemotherapy can affect healthy cells as well as cancer cells. Report any side effects. Continue your course of treatment even though you feel ill unless your care team tells you to stop. This medication can cause serious allergic reactions. To reduce the risk, your care team may give you other medications to take before receiving this one. Be sure to follow the directions from your care team. This medication may increase your risk of getting an infection. Call your care team for advice if you get a fever, chills, sore throat, or  other symptoms of a cold or flu. Do not treat yourself. Try to avoid being around people who are sick. This medication may increase your risk to bruise or bleed. Call your care team if you notice any unusual bleeding. Be careful brushing or flossing your teeth or using a toothpick because you may get an infection or bleed more easily. If you have any dental work done, tell your dentist you are receiving this medication. Talk to your care team if you may be pregnant. Serious birth defects can occur if you take this medication during pregnancy. Talk to your care team before breastfeeding. Changes to your treatment plan may be needed. What side effects may I notice from receiving this medication? Side effects that you should report to your care team as soon as possible: Allergic reactions--skin rash, itching, hives, swelling of the face, lips, tongue, or throat Heart rhythm changes--fast or irregular heartbeat, dizziness, feeling faint or lightheaded, chest pain, trouble breathing Increase in blood pressure Infection--fever, chills, cough, sore throat, wounds that don't heal, pain or trouble when passing urine, general feeling of discomfort or being unwell Low blood pressure--dizziness, feeling faint or lightheaded, blurry vision Low red blood cell level--unusual weakness or fatigue, dizziness, headache, trouble breathing Painful swelling, warmth, or redness of the skin, blisters  or sores at the infusion site Pain, tingling, or numbness in the hands or feet Slow heartbeat--dizziness, feeling faint or lightheaded, confusion, trouble breathing, unusual weakness or fatigue Unusual bruising or bleeding Side effects that usually do not require medical attention (report to your care team if they continue or are bothersome): Diarrhea Hair loss Joint pain Loss of appetite Muscle pain Nausea Vomiting This list may not describe all possible side effects. Call your doctor for medical advice about side  effects. You may report side effects to FDA at 1-800-FDA-1088. Where should I keep my medication? This medication is given in a hospital or clinic. It will not be stored at home. NOTE: This sheet is a summary. It may not cover all possible information. If you have questions about this medicine, talk to your doctor, pharmacist, or health care provider.  2024 Elsevier/Gold Standard (2021-06-30 00:00:00) ____________________________________________________ Carboplatin  Injection What is this medication? CARBOPLATIN  (KAR boe pla tin) treats some types of cancer. It works by slowing down the growth of cancer cells. This medicine may be used for other purposes; ask your health care provider or pharmacist if you have questions. COMMON BRAND NAME(S): Paraplatin  What should I tell my care team before I take this medication? They need to know if you have any of these conditions: Blood disorders Hearing problems Kidney disease Recent or ongoing radiation therapy An unusual or allergic reaction to carboplatin , cisplatin, other medications, foods, dyes, or preservatives Pregnant or trying to get pregnant Breast-feeding How should I use this medication? This medication is injected into a vein. It is given by your care team in a hospital or clinic setting. Talk to your care team about the use of this medication in children. Special care may be needed. Overdosage: If you think you have taken too much of this medicine contact a poison control center or emergency room at once. NOTE: This medicine is only for you. Do not share this medicine with others. What if I miss a dose? Keep appointments for follow-up doses. It is important not to miss your dose. Call your care team if you are unable to keep an appointment. What may interact with this medication? Medications for seizures Some antibiotics, such as amikacin, gentamicin, neomycin, streptomycin, tobramycin Vaccines This list may not describe all  possible interactions. Give your health care provider a list of all the medicines, herbs, non-prescription drugs, or dietary supplements you use. Also tell them if you smoke, drink alcohol, or use illegal drugs. Some items may interact with your medicine. What should I watch for while using this medication? Your condition will be monitored carefully while you are receiving this medication. You may need blood work while taking this medication. This medication may make you feel generally unwell. This is not uncommon, as chemotherapy can affect healthy cells as well as cancer cells. Report any side effects. Continue your course of treatment even though you feel ill unless your care team tells you to stop. In some cases, you may be given additional medications to help with side effects. Follow all directions for their use. This medication may increase your risk of getting an infection. Call your care team for advice if you get a fever, chills, sore throat, or other symptoms of a cold or flu. Do not treat yourself. Try to avoid being around people who are sick. Avoid taking medications that contain aspirin, acetaminophen , ibuprofen, naproxen, or ketoprofen unless instructed by your care team. These medications may hide a fever. Be careful brushing or flossing your  teeth or using a toothpick because you may get an infection or bleed more easily. If you have any dental work done, tell your dentist you are receiving this medication. Talk to your care team if you wish to become pregnant or think you might be pregnant. This medication can cause serious birth defects. Talk to your care team about effective forms of contraception. Do not breast-feed while taking this medication. What side effects may I notice from receiving this medication? Side effects that you should report to your care team as soon as possible: Allergic reactions--skin rash, itching, hives, swelling of the face, lips, tongue, or  throat Infection--fever, chills, cough, sore throat, wounds that don't heal, pain or trouble when passing urine, general feeling of discomfort or being unwell Low red blood cell level--unusual weakness or fatigue, dizziness, headache, trouble breathing Pain, tingling, or numbness in the hands or feet, muscle weakness, change in vision, confusion or trouble speaking, loss of balance or coordination, trouble walking, seizures Unusual bruising or bleeding Side effects that usually do not require medical attention (report to your care team if they continue or are bothersome): Hair loss Nausea Unusual weakness or fatigue Vomiting This list may not describe all possible side effects. Call your doctor for medical advice about side effects. You may report side effects to FDA at 1-800-FDA-1088. Where should I keep my medication? This medication is given in a hospital or clinic. It will not be stored at home. NOTE: This sheet is a summary. It may not cover all possible information. If you have questions about this medicine, talk to your doctor, pharmacist, or health care provider.  2024 Elsevier/Gold Standard (2021-06-02 00:00:00)

## 2023-09-16 NOTE — Progress Notes (Signed)
 Pt restarting Taxol /Carboplatin  today. Last had January 2024 and does not recall any adverse effects. Discussed with pharmacist: does not require titration and premeds are appropriate for the total number of doses she has received up to this point. Did start Taxol  at 25% of the goal rate for the first 15 minutes & pt tolerated well.  Discussed icing to help prevent neuropathy. She chose to do compression gloves and no ice to her feet.

## 2023-09-16 NOTE — Progress Notes (Signed)
 Patient seen by Olam Ned NP today  Vitals are within treatment parameters:Yes   Labs are within treatment parameters: Yes   Treatment plan has been signed: Yes   Per physician team, Patient is ready for treatment and there are NO modifications to the treatment plan. MD did not think she needed home dexamethasone  since she has had  paclitaxel  in past without adverse effect.

## 2023-09-17 ENCOUNTER — Ambulatory Visit

## 2023-09-17 ENCOUNTER — Other Ambulatory Visit: Payer: Self-pay

## 2023-09-17 ENCOUNTER — Inpatient Hospital Stay

## 2023-09-17 VITALS — BP 173/99 | HR 97 | Temp 97.3°F | Resp 16

## 2023-09-17 DIAGNOSIS — C481 Malignant neoplasm of specified parts of peritoneum: Secondary | ICD-10-CM

## 2023-09-17 DIAGNOSIS — Z5111 Encounter for antineoplastic chemotherapy: Secondary | ICD-10-CM | POA: Diagnosis not present

## 2023-09-17 LAB — CA 125: Cancer Antigen (CA) 125: 175 U/mL — ABNORMAL HIGH (ref 0.0–38.1)

## 2023-09-17 MED ORDER — PEGFILGRASTIM-JMDB 6 MG/0.6ML ~~LOC~~ SOSY
6.0000 mg | PREFILLED_SYRINGE | Freq: Once | SUBCUTANEOUS | Status: AC
  Administered 2023-09-17: 6 mg via SUBCUTANEOUS
  Filled 2023-09-17: qty 0.6

## 2023-09-20 ENCOUNTER — Encounter: Payer: Self-pay | Admitting: Nurse Practitioner

## 2023-09-21 ENCOUNTER — Other Ambulatory Visit: Payer: Self-pay | Admitting: *Deleted

## 2023-09-21 ENCOUNTER — Encounter: Payer: Self-pay | Admitting: *Deleted

## 2023-09-21 DIAGNOSIS — C481 Malignant neoplasm of specified parts of peritoneum: Secondary | ICD-10-CM

## 2023-09-21 NOTE — Progress Notes (Signed)
 Attempted to reach out to [facility] to determine what is the plan to manage her hydronephrosis? Saw provider on 7/16 and patient has not heard anything from them for f/u. Attempted to reach [facility] calling the doctors line and triage line without success (on hold 10+ minutes each line). Faxed message asking for update-next chemo is 8/14.

## 2023-09-22 ENCOUNTER — Inpatient Hospital Stay

## 2023-09-22 ENCOUNTER — Ambulatory Visit: Admitting: Oncology

## 2023-09-22 ENCOUNTER — Ambulatory Visit

## 2023-09-27 ENCOUNTER — Inpatient Hospital Stay (HOSPITAL_BASED_OUTPATIENT_CLINIC_OR_DEPARTMENT_OTHER): Admitting: Nurse Practitioner

## 2023-09-27 ENCOUNTER — Inpatient Hospital Stay

## 2023-09-27 ENCOUNTER — Telehealth: Payer: Self-pay

## 2023-09-27 ENCOUNTER — Encounter: Payer: Self-pay | Admitting: Nurse Practitioner

## 2023-09-27 ENCOUNTER — Inpatient Hospital Stay: Attending: Oncology

## 2023-09-27 VITALS — BP 136/79 | HR 105 | Temp 97.7°F | Resp 18 | Ht 59.0 in | Wt 121.9 lb

## 2023-09-27 DIAGNOSIS — C481 Malignant neoplasm of specified parts of peritoneum: Secondary | ICD-10-CM

## 2023-09-27 DIAGNOSIS — Z5189 Encounter for other specified aftercare: Secondary | ICD-10-CM | POA: Insufficient documentation

## 2023-09-27 DIAGNOSIS — Z5111 Encounter for antineoplastic chemotherapy: Secondary | ICD-10-CM | POA: Diagnosis present

## 2023-09-27 LAB — CBC WITH DIFFERENTIAL (CANCER CENTER ONLY)
Abs Immature Granulocytes: 0.23 K/uL — ABNORMAL HIGH (ref 0.00–0.07)
Basophils Absolute: 0.1 K/uL (ref 0.0–0.1)
Basophils Relative: 1 %
Eosinophils Absolute: 0.1 K/uL (ref 0.0–0.5)
Eosinophils Relative: 1 %
HCT: 38.8 % (ref 36.0–46.0)
Hemoglobin: 12.8 g/dL (ref 12.0–15.0)
Immature Granulocytes: 2 %
Lymphocytes Relative: 9 %
Lymphs Abs: 1.2 K/uL (ref 0.7–4.0)
MCH: 37 pg — ABNORMAL HIGH (ref 26.0–34.0)
MCHC: 33 g/dL (ref 30.0–36.0)
MCV: 112.1 fL — ABNORMAL HIGH (ref 80.0–100.0)
Monocytes Absolute: 0.8 K/uL (ref 0.1–1.0)
Monocytes Relative: 6 %
Neutro Abs: 10.4 K/uL — ABNORMAL HIGH (ref 1.7–7.7)
Neutrophils Relative %: 81 %
Platelet Count: 151 K/uL (ref 150–400)
RBC: 3.46 MIL/uL — ABNORMAL LOW (ref 3.87–5.11)
RDW: 13.8 % (ref 11.5–15.5)
WBC Count: 12.8 K/uL — ABNORMAL HIGH (ref 4.0–10.5)
nRBC: 0 % (ref 0.0–0.2)

## 2023-09-27 LAB — CMP (CANCER CENTER ONLY)
ALT: 15 U/L (ref 0–44)
AST: 27 U/L (ref 15–41)
Albumin: 3.9 g/dL (ref 3.5–5.0)
Alkaline Phosphatase: 120 U/L (ref 38–126)
Anion gap: 12 (ref 5–15)
BUN: 11 mg/dL (ref 8–23)
CO2: 25 mmol/L (ref 22–32)
Calcium: 9.3 mg/dL (ref 8.9–10.3)
Chloride: 101 mmol/L (ref 98–111)
Creatinine: 0.71 mg/dL (ref 0.44–1.00)
GFR, Estimated: >60 mL/min (ref >60)
Glucose, Bld: 168 mg/dL — ABNORMAL HIGH (ref 70–99)
Potassium: 4 mmol/L (ref 3.5–5.1)
Sodium: 138 mmol/L (ref 135–145)
Total Bilirubin: <0.2 mg/dL (ref 0.0–1.2)
Total Protein: 7 g/dL (ref 6.5–8.1)

## 2023-09-27 NOTE — Telephone Encounter (Signed)
 I contacted [facility] and spoke with [facility] in the triage department regarding the patient's follow-up schedule. Sandi requested the patient's most recent lab results, which I faxed to [phone]. The patient is scheduled to arrive on September 29, 2023, at 11:30 a.m. for an ultrasound, with a consultation with the PA at 2:00 p.m. The patient has been informed of these appointment times.

## 2023-09-27 NOTE — Progress Notes (Signed)
 [facility] OFFICE PROGRESS NOTE   Diagnosis: Peritoneal cancer  INTERVAL HISTORY:   Nancy Harrison returns as scheduled.  She completed cycle 1 Taxol /carboplatin  09/16/2023.  She had mild nausea for a few hours after the chemotherapy.  No vomiting.  No mouth sores.  No diarrhea.  Stools are periodically a little loose.  No signs of allergic reaction.  No numbness or tingling in the hands or feet.  No arthralgias.  Objective:  Vital signs in last 24 hours:  Blood pressure 136/79, pulse (!) 105, temperature 97.7 F (36.5 C), temperature source Temporal, resp. rate 18, height 4' 11 (1.499 m), weight 121 lb 14.4 oz (55.3 kg), SpO2 96%.    HEENT: No thrush or ulcers. Resp: Lungs clear bilaterally. Cardio: Regular rate and rhythm. GI: No hepatosplenomegaly.  No mass.  Right abdomen ileostomy.  Thick to formed stool in the collection bag. Vascular: No leg edema. Neuro: Alert and oriented. Skin: No rash. Port-A-Cath without erythema.  Lab Results:  Lab Results  Component Value Date   WBC 12.8 (H) 09/27/2023   HGB 12.8 09/27/2023   HCT 38.8 09/27/2023   MCV 112.1 (H) 09/27/2023   PLT 151 09/27/2023   NEUTROABS 10.4 (H) 09/27/2023    Imaging:  No results found.  Medications: I have reviewed the patient's current medications.  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  Bone lesions concerning for metastases, right lung nodule Laparoscopy, diverting loop ileostomy, gastrostomy tube placement, peritoneal biopsy 10/16/2021, no evidence of a primary tumor site at the appendix, ovaries, or uterus.  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  CDX2, CK20, and GATA3 negative.  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 Foundation 1-MSS, tumor mutation burden 5, HRD positive-LOH score 34.1% 08/11/2021 CA125 818 CT chest 11/03/2021-10 mm right lower lobe nodule, scattered tiny pulmonary nodules, peritoneal carcinomatosis Cycle 1 Taxol /carboplatin  11/12/2021 Cycle 2 Taxol /carboplatin  12/03/2021 Cycle 3 Taxol /carboplatin  12/24/2021 Bone scan 01/05/2022-negative for metastatic disease Cycle 4 Taxol /carboplatin  01/13/2022 CTs 01/22/2022-reduction in peritoneal carcinomatosis, no evidence of progressive disease stable bilateral lower lobe pulmonary nodules Cycle 5 Taxol /carboplatin  02/03/2022 Cycle 6 Taxol /carboplatin  02/24/2022 CT abdomen/pelvis 04/19/2022-slight decrease in peritoneal carcinomatosis with residual lesions 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 Olaparib  05/14/2022 Bevacizumab  every 3 weeks 05/19/2022 Olaparib  held on hospital admission 05/29/2022 Olaparib  resumed 06/09/2022, dose reduced to 150 mg twice daily CT abdomen/pelvis 09/06/2022-decrease in size and conspicuity of peritoneal carcinomatosis, unchanged left adrenal nodule Olaparib /bevacizumab  continued CT abdomen/pelvis 03/16/2023: No progressive disease, stable ill-defined soft tissue in the gastrocolic ligament, stable right lower lobe nodule Olaparib /bevacizumab  continued CT abdomen/pelvis 08/27/2023: New right hydroureteronephrosis to the level of the pelvic inlet/13 mm enhancing nodule, soft tissue density at the gastrocolic ligament measures smaller in a previously identified nodule adjacent to this is not identified, left adrenal nodule is stable, stable right lower lobe nodule Olaparib /bevacizumab  discontinued Cycle 1 Taxol /carboplatin  09/16/2023, Fulphila  2.    Abdominal distention/pain and diarrhea secondary to #1 3.   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  Lumpectomy and adjuvant CMF x8 followed by radiation 4. Hypertension 5. Hypothyroidism 6. Barrett's esophagus 7.  Family history of multiple cancers including appendix, colon, and lung cancer 8.  Hospital admission 11/05/2021 with dehydration/prerenal azotemia secondary to nausea and high output ileostomy 9.  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 Admission 05/29/2022 with dehydration and acute renal injury  Disposition: Nancy Harrison appears stable.  She completed cycle 1 Taxol /carboplatin  09/16/2023.  She seems to have tolerated well.  She received white cell growth factor support. CBC and chemistry panel from today reviewed.  Counts look good.  Renal function remains normal.  We will contact urology regarding the plan for ureter stent placement.  She will return for follow-up and cycle 2 Taxol /carboplatin  10/06/2023.  She will contact the office in the interim with any problems.   Olam Ned ANP/GNP-BC   09/27/2023  11:49 AM

## 2023-09-28 ENCOUNTER — Other Ambulatory Visit: Payer: Self-pay

## 2023-09-29 DIAGNOSIS — N130 Hydronephrosis with ureteropelvic junction obstruction: Secondary | ICD-10-CM | POA: Diagnosis not present

## 2023-09-30 ENCOUNTER — Other Ambulatory Visit: Payer: Self-pay

## 2023-10-02 ENCOUNTER — Other Ambulatory Visit: Payer: Self-pay | Admitting: Oncology

## 2023-10-03 DIAGNOSIS — Z932 Ileostomy status: Secondary | ICD-10-CM | POA: Diagnosis not present

## 2023-10-05 ENCOUNTER — Emergency Department (HOSPITAL_COMMUNITY)
Admission: EM | Admit: 2023-10-05 | Discharge: 2023-10-05 | Disposition: A | Discharge status: Home / Self Care | Attending: Emergency Medicine | Admitting: Emergency Medicine

## 2023-10-05 ENCOUNTER — Other Ambulatory Visit: Payer: Self-pay

## 2023-10-05 DIAGNOSIS — R040 Epistaxis: Secondary | ICD-10-CM | POA: Diagnosis not present

## 2023-10-05 DIAGNOSIS — R509 Fever, unspecified: Secondary | ICD-10-CM | POA: Diagnosis not present

## 2023-10-05 DIAGNOSIS — R000 Tachycardia, unspecified: Secondary | ICD-10-CM | POA: Diagnosis not present

## 2023-10-05 DIAGNOSIS — I10 Essential (primary) hypertension: Secondary | ICD-10-CM | POA: Insufficient documentation

## 2023-10-05 DIAGNOSIS — Z79899 Other long term (current) drug therapy: Secondary | ICD-10-CM | POA: Insufficient documentation

## 2023-10-05 NOTE — Discharge Instructions (Signed)
 Your history, exam, and evaluation is a seem consistent with recurrent nosebleed in the setting of the environmental humidity and weather changes as we discussed.  Please rest and stay hydrated and follow-up with PCP and ear nose and throat doctor.  We had a shared decision making conversation and agreed to hold on significant intervention or extensive lab work today given your recent reassuring labs and otherwise well appearance.  If any symptoms change or worsen acutely, please return to the nearest emergency department.

## 2023-10-05 NOTE — ED Triage Notes (Signed)
 Pt BIBA from home, called out initially for nosebleed which resolved itself. HR found to be elevated and SpO2 90% RA. Hx cancer. 22ga LF. 150cc LR  120/70 Hr 120 down to 106 30rr Cbg 111
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
## 2023-10-06 ENCOUNTER — Inpatient Hospital Stay

## 2023-10-06 ENCOUNTER — Encounter: Payer: Self-pay | Admitting: Nurse Practitioner

## 2023-10-06 ENCOUNTER — Ambulatory Visit

## 2023-10-06 ENCOUNTER — Other Ambulatory Visit

## 2023-10-06 ENCOUNTER — Ambulatory Visit: Admitting: Nurse Practitioner

## 2023-10-06 ENCOUNTER — Inpatient Hospital Stay (HOSPITAL_BASED_OUTPATIENT_CLINIC_OR_DEPARTMENT_OTHER): Admitting: Nurse Practitioner

## 2023-10-06 VITALS — BP 140/80 | HR 107 | Resp 18

## 2023-10-06 VITALS — BP 121/84 | HR 100 | Temp 97.8°F | Resp 18 | Ht 59.0 in | Wt 123.8 lb

## 2023-10-06 DIAGNOSIS — C481 Malignant neoplasm of specified parts of peritoneum: Secondary | ICD-10-CM

## 2023-10-06 DIAGNOSIS — Z5111 Encounter for antineoplastic chemotherapy: Secondary | ICD-10-CM | POA: Diagnosis not present

## 2023-10-06 LAB — CBC WITH DIFFERENTIAL (CANCER CENTER ONLY)
Abs Immature Granulocytes: 0.02 K/uL (ref 0.00–0.07)
Basophils Absolute: 0.1 K/uL (ref 0.0–0.1)
Basophils Relative: 1 %
Eosinophils Absolute: 0.1 K/uL (ref 0.0–0.5)
Eosinophils Relative: 2 %
HCT: 35.9 % — ABNORMAL LOW (ref 36.0–46.0)
Hemoglobin: 11.8 g/dL — ABNORMAL LOW (ref 12.0–15.0)
Immature Granulocytes: 0 %
Lymphocytes Relative: 18 %
Lymphs Abs: 1.4 K/uL (ref 0.7–4.0)
MCH: 36.8 pg — ABNORMAL HIGH (ref 26.0–34.0)
MCHC: 32.9 g/dL (ref 30.0–36.0)
MCV: 111.8 fL — ABNORMAL HIGH (ref 80.0–100.0)
Monocytes Absolute: 0.9 K/uL (ref 0.1–1.0)
Monocytes Relative: 11 %
Neutro Abs: 5.3 K/uL (ref 1.7–7.7)
Neutrophils Relative %: 68 %
Platelet Count: 335 K/uL (ref 150–400)
RBC: 3.21 MIL/uL — ABNORMAL LOW (ref 3.87–5.11)
RDW: 14.1 % (ref 11.5–15.5)
WBC Count: 7.8 K/uL (ref 4.0–10.5)
nRBC: 0 % (ref 0.0–0.2)

## 2023-10-06 LAB — CMP (CANCER CENTER ONLY)
ALT: 12 U/L (ref 0–44)
AST: 24 U/L (ref 15–41)
Albumin: 3.8 g/dL (ref 3.5–5.0)
Alkaline Phosphatase: 78 U/L (ref 38–126)
Anion gap: 13 (ref 5–15)
BUN: 14 mg/dL (ref 8–23)
CO2: 24 mmol/L (ref 22–32)
Calcium: 9.6 mg/dL (ref 8.9–10.3)
Chloride: 103 mmol/L (ref 98–111)
Creatinine: 0.68 mg/dL (ref 0.44–1.00)
GFR, Estimated: >60 mL/min (ref >60)
Glucose, Bld: 156 mg/dL — ABNORMAL HIGH (ref 70–99)
Potassium: 3.8 mmol/L (ref 3.5–5.1)
Sodium: 140 mmol/L (ref 135–145)
Total Bilirubin: 0.3 mg/dL (ref 0.0–1.2)
Total Protein: 6.7 g/dL (ref 6.5–8.1)

## 2023-10-06 MED ORDER — SODIUM CHLORIDE 0.9 % IV SOLN
318.0000 mg | Freq: Once | INTRAVENOUS | Status: AC
  Administered 2023-10-06: 320 mg via INTRAVENOUS
  Filled 2023-10-06: qty 32

## 2023-10-06 MED ORDER — FAMOTIDINE IN NACL 20-0.9 MG/50ML-% IV SOLN
20.0000 mg | Freq: Once | INTRAVENOUS | Status: AC
  Administered 2023-10-06: 20 mg via INTRAVENOUS
  Filled 2023-10-06: qty 50

## 2023-10-06 MED ORDER — ACETAMINOPHEN 325 MG PO TABS
650.0000 mg | ORAL_TABLET | Freq: Once | ORAL | Status: AC
  Administered 2023-10-06: 650 mg via ORAL
  Filled 2023-10-06: qty 2

## 2023-10-06 MED ORDER — DIPHENHYDRAMINE HCL 50 MG/ML IJ SOLN
25.0000 mg | Freq: Once | INTRAMUSCULAR | Status: AC
  Administered 2023-10-06: 25 mg via INTRAVENOUS
  Filled 2023-10-06: qty 1

## 2023-10-06 MED ORDER — SODIUM CHLORIDE 0.9 % IV SOLN: INTRAVENOUS | Status: DC

## 2023-10-06 MED ORDER — SODIUM CHLORIDE 0.9 % IV SOLN
175.0000 mg/m2 | Freq: Once | INTRAVENOUS | Status: AC
  Administered 2023-10-06: 264 mg via INTRAVENOUS
  Filled 2023-10-06: qty 44

## 2023-10-06 MED ORDER — SODIUM CHLORIDE 0.9 % IV SOLN
150.0000 mg | Freq: Once | INTRAVENOUS | Status: AC
  Administered 2023-10-06: 150 mg via INTRAVENOUS
  Filled 2023-10-06: qty 150

## 2023-10-06 MED ORDER — DEXAMETHASONE SODIUM PHOSPHATE 10 MG/ML IJ SOLN
10.0000 mg | Freq: Once | INTRAMUSCULAR | Status: AC
  Administered 2023-10-06: 10 mg via INTRAVENOUS
  Filled 2023-10-06: qty 1

## 2023-10-06 MED ORDER — PALONOSETRON HCL INJECTION 0.25 MG/5ML
0.2500 mg | Freq: Once | INTRAVENOUS | Status: AC
  Administered 2023-10-06: 0.25 mg via INTRAVENOUS
  Filled 2023-10-06: qty 5

## 2023-10-06 NOTE — Progress Notes (Signed)
 Patient activated bathroom pull cord to report a fall. She was assisted up, provided immediate care, and transferred to the infusion suite. Vitals, neuro checks, and skin assessment were completed. Hematoma noted to right temporal area (4  4 cm). Provider notified; both Oncologist and APP assessed patient. MD ordered ice compress to hematoma, 30 min observation, and Tylenol  650 mg. Patient drove herself to visit, lives 5 minutes away, and insisted she was fine to drive. MD cleared for discharge with follow-up call planned for the next morning.

## 2023-10-06 NOTE — Progress Notes (Signed)
 Patient seen by Olam Ned NP today  Vitals are within treatment parameters:Yes   Labs are within treatment parameters: Yes   Treatment plan has been signed: Yes   Per physician team, Patient is ready for treatment and there are NO modifications to the treatment plan.

## 2023-10-06 NOTE — Patient Instructions (Signed)
 [facility] - A DEPT OF [facility]  Discharge Instructions: Thank you for choosing [facility] to provide your oncology and hematology care.   If you have a lab appointment with the Cancer Center, please go directly to the Cancer Center and check in at the registration area.   Wear comfortable clothing and clothing appropriate for easy access to any Portacath or PICC line.   We strive to give you quality time with your provider. You may need to reschedule your appointment if you arrive late (15 or more minutes).  Arriving late affects you and other patients whose appointments are after yours.  Also, if you miss three or more appointments without notifying the office, you may be dismissed from the clinic at the provider's discretion.      For prescription refill requests, have your pharmacy contact our office and allow 72 hours for refills to be completed.    Today you received the following chemotherapy and/or immunotherapy agents: Taxol  and carboplatin .     To help prevent nausea and vomiting after your treatment, we encourage you to take your nausea medication as directed.  BELOW ARE SYMPTOMS THAT SHOULD BE REPORTED IMMEDIATELY: *FEVER GREATER THAN 100.4 F (38 C) OR HIGHER *CHILLS OR SWEATING *NAUSEA AND VOMITING THAT IS NOT CONTROLLED WITH YOUR NAUSEA MEDICATION *UNUSUAL SHORTNESS OF BREATH *UNUSUAL BRUISING OR BLEEDING *URINARY PROBLEMS (pain or burning when urinating, or frequent urination) *BOWEL PROBLEMS (unusual diarrhea, constipation, pain near the anus) TENDERNESS IN MOUTH AND THROAT WITH OR WITHOUT PRESENCE OF ULCERS (sore throat, sores in mouth, or a toothache) UNUSUAL RASH, SWELLING OR PAIN  UNUSUAL VAGINAL DISCHARGE OR ITCHING   Items with * indicate a potential emergency and should be followed up as soon as possible or go to the Emergency Department if any problems should occur.  Please show the CHEMOTHERAPY ALERT CARD or  IMMUNOTHERAPY ALERT CARD at check-in to the Emergency Department and triage nurse.  Should you have questions after your visit or need to cancel or reschedule your appointment, please contact [facility] - A DEPT OF [facility]  Dept: [phone]  and follow the prompts.  Office hours are 8:00 a.m. to 4:30 p.m. Monday - Friday. Please note that voicemails left after 4:00 p.m. may not be returned until the following business day.  We are closed weekends and major holidays. You have access to a nurse at all times for urgent questions. Please call the main number to the clinic Dept: [phone] and follow the prompts.   For any non-urgent questions, you may also contact your provider using MyChart. We now offer e-Visits for anyone 58 and older to request care online for non-urgent symptoms. For details visit [email or web address].   Also download the MyChart app! Go to the app store, search MyChart, open the app, select [place], and log in with your MyChart username and password. ________________________________________________________ Paclitaxel  Injection What is this medication? PACLITAXEL  (PAK li TAX el) treats some types of cancer. It works by slowing down the growth of cancer cells. This medicine may be used for other purposes; ask your health care provider or pharmacist if you have questions. COMMON BRAND NAME(S): Onxol, Taxol  What should I tell my care team before I take this medication? They need to know if you have any of these conditions: Heart disease Liver disease Low white blood cell levels An unusual or allergic reaction to paclitaxel , other medications, foods, dyes, or preservatives  If you or your partner are pregnant or trying to get pregnant Breast-feeding How should I use this medication? This medication is injected into a vein. It is given by your care team in a hospital or clinic setting. Talk to your care team about the use of this  medication in children. While it may be given to children for selected conditions, precautions do apply. Overdosage: If you think you have taken too much of this medicine contact a poison control center or emergency room at once. NOTE: This medicine is only for you. Do not share this medicine with others. What if I miss a dose? Keep appointments for follow-up doses. It is important not to miss your dose. Call your care team if you are unable to keep an appointment. What may interact with this medication? Do not take this medication with any of the following: Live virus vaccines Other medications may affect the way this medication works. Talk with your care team about all of the medications you take. They may suggest changes to your treatment plan to lower the risk of side effects and to make sure your medications work as intended. This list may not describe all possible interactions. Give your health care provider a list of all the medicines, herbs, non-prescription drugs, or dietary supplements you use. Also tell them if you smoke, drink alcohol, or use illegal drugs. Some items may interact with your medicine. What should I watch for while using this medication? Your condition will be monitored carefully while you are receiving this medication. You may need blood work while taking this medication. This medication may make you feel generally unwell. This is not uncommon as chemotherapy can affect healthy cells as well as cancer cells. Report any side effects. Continue your course of treatment even though you feel ill unless your care team tells you to stop. This medication can cause serious allergic reactions. To reduce the risk, your care team may give you other medications to take before receiving this one. Be sure to follow the directions from your care team. This medication may increase your risk of getting an infection. Call your care team for advice if you get a fever, chills, sore throat, or  other symptoms of a cold or flu. Do not treat yourself. Try to avoid being around people who are sick. This medication may increase your risk to bruise or bleed. Call your care team if you notice any unusual bleeding. Be careful brushing or flossing your teeth or using a toothpick because you may get an infection or bleed more easily. If you have any dental work done, tell your dentist you are receiving this medication. Talk to your care team if you may be pregnant. Serious birth defects can occur if you take this medication during pregnancy. Talk to your care team before breastfeeding. Changes to your treatment plan may be needed. What side effects may I notice from receiving this medication? Side effects that you should report to your care team as soon as possible: Allergic reactions--skin rash, itching, hives, swelling of the face, lips, tongue, or throat Heart rhythm changes--fast or irregular heartbeat, dizziness, feeling faint or lightheaded, chest pain, trouble breathing Increase in blood pressure Infection--fever, chills, cough, sore throat, wounds that don't heal, pain or trouble when passing urine, general feeling of discomfort or being unwell Low blood pressure--dizziness, feeling faint or lightheaded, blurry vision Low red blood cell level--unusual weakness or fatigue, dizziness, headache, trouble breathing Painful swelling, warmth, or redness of the skin, blisters  or sores at the infusion site Pain, tingling, or numbness in the hands or feet Slow heartbeat--dizziness, feeling faint or lightheaded, confusion, trouble breathing, unusual weakness or fatigue Unusual bruising or bleeding Side effects that usually do not require medical attention (report to your care team if they continue or are bothersome): Diarrhea Hair loss Joint pain Loss of appetite Muscle pain Nausea Vomiting This list may not describe all possible side effects. Call your doctor for medical advice about side  effects. You may report side effects to FDA at 1-800-FDA-1088. Where should I keep my medication? This medication is given in a hospital or clinic. It will not be stored at home. NOTE: This sheet is a summary. It may not cover all possible information. If you have questions about this medicine, talk to your doctor, pharmacist, or health care provider.  2024 Elsevier/Gold Standard (2021-06-30 00:00:00) ____________________________________________________ Carboplatin  Injection What is this medication? CARBOPLATIN  (KAR boe pla tin) treats some types of cancer. It works by slowing down the growth of cancer cells. This medicine may be used for other purposes; ask your health care provider or pharmacist if you have questions. COMMON BRAND NAME(S): Paraplatin  What should I tell my care team before I take this medication? They need to know if you have any of these conditions: Blood disorders Hearing problems Kidney disease Recent or ongoing radiation therapy An unusual or allergic reaction to carboplatin , cisplatin, other medications, foods, dyes, or preservatives Pregnant or trying to get pregnant Breast-feeding How should I use this medication? This medication is injected into a vein. It is given by your care team in a hospital or clinic setting. Talk to your care team about the use of this medication in children. Special care may be needed. Overdosage: If you think you have taken too much of this medicine contact a poison control center or emergency room at once. NOTE: This medicine is only for you. Do not share this medicine with others. What if I miss a dose? Keep appointments for follow-up doses. It is important not to miss your dose. Call your care team if you are unable to keep an appointment. What may interact with this medication? Medications for seizures Some antibiotics, such as amikacin, gentamicin, neomycin, streptomycin, tobramycin Vaccines This list may not describe all  possible interactions. Give your health care provider a list of all the medicines, herbs, non-prescription drugs, or dietary supplements you use. Also tell them if you smoke, drink alcohol, or use illegal drugs. Some items may interact with your medicine. What should I watch for while using this medication? Your condition will be monitored carefully while you are receiving this medication. You may need blood work while taking this medication. This medication may make you feel generally unwell. This is not uncommon, as chemotherapy can affect healthy cells as well as cancer cells. Report any side effects. Continue your course of treatment even though you feel ill unless your care team tells you to stop. In some cases, you may be given additional medications to help with side effects. Follow all directions for their use. This medication may increase your risk of getting an infection. Call your care team for advice if you get a fever, chills, sore throat, or other symptoms of a cold or flu. Do not treat yourself. Try to avoid being around people who are sick. Avoid taking medications that contain aspirin, acetaminophen , ibuprofen, naproxen, or ketoprofen unless instructed by your care team. These medications may hide a fever. Be careful brushing or flossing your  teeth or using a toothpick because you may get an infection or bleed more easily. If you have any dental work done, tell your dentist you are receiving this medication. Talk to your care team if you wish to become pregnant or think you might be pregnant. This medication can cause serious birth defects. Talk to your care team about effective forms of contraception. Do not breast-feed while taking this medication. What side effects may I notice from receiving this medication? Side effects that you should report to your care team as soon as possible: Allergic reactions--skin rash, itching, hives, swelling of the face, lips, tongue, or  throat Infection--fever, chills, cough, sore throat, wounds that don't heal, pain or trouble when passing urine, general feeling of discomfort or being unwell Low red blood cell level--unusual weakness or fatigue, dizziness, headache, trouble breathing Pain, tingling, or numbness in the hands or feet, muscle weakness, change in vision, confusion or trouble speaking, loss of balance or coordination, trouble walking, seizures Unusual bruising or bleeding Side effects that usually do not require medical attention (report to your care team if they continue or are bothersome): Hair loss Nausea Unusual weakness or fatigue Vomiting This list may not describe all possible side effects. Call your doctor for medical advice about side effects. You may report side effects to FDA at 1-800-FDA-1088. Where should I keep my medication? This medication is given in a hospital or clinic. It will not be stored at home. NOTE: This sheet is a summary. It may not cover all possible information. If you have questions about this medicine, talk to your doctor, pharmacist, or health care provider.  2024 Elsevier/Gold Standard (2021-06-02 00:00:00)

## 2023-10-06 NOTE — Progress Notes (Signed)
 Patient went to restroom, RN assisted patient to restroom and then explained to pull the cord if assistance was needed. Cord was pulled at 1500 and patient found on the floor. Patient was then assisted to toilet where vital signs were obtained. Provider notified. Patient explained she hit her head when falling. Patient has visible swelling to right temporal measuring 4X4. Olam Ned, NP assessed patient. Patient will remain with us  for observation. Patient declined notification to family. Will continue to monitor patient.

## 2023-10-06 NOTE — Progress Notes (Signed)
 [facility] OFFICE PROGRESS NOTE   Diagnosis: Peritoneal cancer  INTERVAL HISTORY:   Ms. Culliton returns as scheduled.  She completed cycle 1 Taxol /carboplatin  09/16/2023.  She had mild nausea for a few hours after the chemotherapy, otherwise tolerated well.  She was seen in the emergency department yesterday for a nosebleed.  The bleeding had ceased when she arrived to the emergency department.  She decided against further workup/intervention.  It was recommended she follow-up with ENT.  Aside from the nosebleed yesterday she has no complaints.  She has had no further epistaxis or other bleeding.  She denies nausea/vomiting.  No mouth sores.  No diarrhea.  No numbness or tingling in the hands or feet.  Objective:  Vital signs in last 24 hours:  Blood pressure 121/84, pulse (!) 105, temperature 97.8 F (36.6 C), temperature source Temporal, resp. rate 18, height 4' 11 (1.499 m), weight 123 lb 12.8 oz (56.2 kg), SpO2 96%.    HEENT: No thrush or ulcers. Resp: Lungs clear bilaterally. Cardio: Regular rate and rhythm. GI: Abdomen soft and nontender.  No hepatomegaly.  No mass.  Right abdomen ileostomy with soft brown stool in the collection bag. Vascular: No leg edema.  Skin: No rash. Port-A-Cath without erythema.  Lab Results:  Lab Results  Component Value Date   WBC 7.8 10/06/2023   HGB 11.8 (L) 10/06/2023   HCT 35.9 (L) 10/06/2023   MCV 111.8 (H) 10/06/2023   PLT 335 10/06/2023   NEUTROABS 5.3 10/06/2023    Imaging:  No results found.  Medications: I have reviewed the patient's current medications.  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  Bone lesions  concerning for metastases, right lung nodule Laparoscopy, diverting loop ileostomy, gastrostomy tube placement, peritoneal biopsy 10/16/2021, no evidence of a primary tumor site at the appendix, ovaries, or uterus.  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  CDX2, CK20, and GATA3 negative.  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 Foundation 1-MSS, tumor mutation burden 5, HRD positive-LOH score 34.1% 08/11/2021 CA125 818 CT chest 11/03/2021-10 mm right lower lobe nodule, scattered tiny pulmonary nodules, peritoneal carcinomatosis Cycle 1 Taxol /carboplatin  11/12/2021 Cycle 2 Taxol /carboplatin  12/03/2021 Cycle 3 Taxol /carboplatin  12/24/2021 Bone scan 01/05/2022-negative for metastatic disease Cycle 4 Taxol /carboplatin  01/13/2022 CTs 01/22/2022-reduction in peritoneal carcinomatosis, no evidence of progressive disease stable bilateral lower lobe pulmonary nodules Cycle 5 Taxol /carboplatin  02/03/2022 Cycle 6 Taxol /carboplatin  02/24/2022 CT abdomen/pelvis 04/19/2022-slight decrease in peritoneal carcinomatosis with residual lesions 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 Olaparib  05/14/2022 Bevacizumab  every 3 weeks 05/19/2022 Olaparib  held on hospital admission 05/29/2022 Olaparib  resumed 06/09/2022, dose reduced to 150 mg twice daily CT abdomen/pelvis 09/06/2022-decrease in size and conspicuity of peritoneal carcinomatosis, unchanged left adrenal nodule Olaparib /bevacizumab  continued CT abdomen/pelvis 03/16/2023: No progressive disease, stable ill-defined soft tissue in the gastrocolic ligament, stable right lower lobe nodule Olaparib /bevacizumab  continued CT abdomen/pelvis 08/27/2023: New right hydroureteronephrosis to the level of the pelvic inlet/13 mm enhancing  nodule, soft tissue density at the gastrocolic ligament measures smaller in a previously identified nodule adjacent to this is not identified, left adrenal nodule is stable, stable right lower lobe nodule Olaparib /bevacizumab  discontinued Cycle 1  Taxol /carboplatin  09/16/2023, Fulphila  Cycle 2 Taxol /carboplatin  10/06/2023, Fulphila  2.   Abdominal distention/pain and diarrhea secondary to #1 3.   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  Lumpectomy and adjuvant CMF x8 followed by radiation 4. Hypertension 5. Hypothyroidism 6. Barrett's esophagus 7. Family history of multiple cancers including appendix, colon, and lung cancer 8.  Hospital admission 11/05/2021 with dehydration/prerenal azotemia secondary to nausea and high output ileostomy 9.  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 Admission 05/29/2022 with dehydration and acute renal injury  Disposition: Ms. Wrench appears stable.  She has completed 1 cycle of Taxol /carboplatin .  Aside from mild nausea she tolerated well.  Plan to proceed with cycle 2 today as scheduled.  CBC and chemistry panel reviewed.  Labs adequate for treatment.  We will follow-up on the CA125 from today.  Episode of epistaxis yesterday, evaluated in the emergency department.  No further nosebleeding.  She plans to follow-up with ENT.  She will return for lab, follow-up, cycle 3 Taxol /carboplatin  in 3 weeks.  We are available to see her sooner if needed.    Olam Ned ANP/GNP-BC   10/06/2023  9:11 AM

## 2023-10-07 ENCOUNTER — Telehealth: Payer: Self-pay

## 2023-10-07 ENCOUNTER — Telehealth: Payer: Self-pay | Admitting: Oncology

## 2023-10-07 DIAGNOSIS — N130 Hydronephrosis with ureteropelvic junction obstruction: Secondary | ICD-10-CM | POA: Diagnosis not present

## 2023-10-07 LAB — CA 125: Cancer Antigen (CA) 125: 94.8 U/mL — ABNORMAL HIGH (ref 0.0–38.1)

## 2023-10-07 NOTE — Telephone Encounter (Signed)
 Reached out to patient regarding her fall on 10/06/2023. Patient states she is doing fine, denies any nausea, vomiting, or dizziness. Educated patient to please reach out if anything changes.

## 2023-10-08 ENCOUNTER — Inpatient Hospital Stay

## 2023-10-08 VITALS — BP 146/84 | HR 96 | Temp 97.9°F | Resp 17

## 2023-10-08 DIAGNOSIS — Z5111 Encounter for antineoplastic chemotherapy: Secondary | ICD-10-CM | POA: Diagnosis not present

## 2023-10-08 DIAGNOSIS — C481 Malignant neoplasm of specified parts of peritoneum: Secondary | ICD-10-CM

## 2023-10-08 LAB — LAB REPORT - SCANNED: EGFR: 85

## 2023-10-08 MED ORDER — PEGFILGRASTIM-JMDB 6 MG/0.6ML ~~LOC~~ SOSY
6.0000 mg | PREFILLED_SYRINGE | Freq: Once | SUBCUTANEOUS | Status: AC
  Administered 2023-10-08: 6 mg via SUBCUTANEOUS
  Filled 2023-10-08: qty 0.6

## 2023-10-08 NOTE — Patient Instructions (Signed)

## 2023-10-09 ENCOUNTER — Other Ambulatory Visit: Payer: Self-pay

## 2023-10-10 ENCOUNTER — Telehealth: Payer: Self-pay

## 2023-10-10 NOTE — Telephone Encounter (Signed)
-----   Message from Olam Ned sent at 10/10/2023  9:00 AM EDT ----- Please let her know CA125 tumor marker was lower.

## 2023-10-10 NOTE — Telephone Encounter (Signed)
 Patient gave verbal understanding and had no further questions or concerns

## 2023-10-13 DIAGNOSIS — N130 Hydronephrosis with ureteropelvic junction obstruction: Secondary | ICD-10-CM | POA: Diagnosis not present

## 2023-10-14 LAB — LAB REPORT - SCANNED: EGFR: 88

## 2023-10-18 ENCOUNTER — Other Ambulatory Visit: Payer: Self-pay

## 2023-10-19 ENCOUNTER — Other Ambulatory Visit: Payer: Self-pay

## 2023-10-23 ENCOUNTER — Other Ambulatory Visit: Payer: Self-pay | Admitting: Oncology

## 2023-10-27 ENCOUNTER — Encounter: Payer: Self-pay | Admitting: Oncology

## 2023-10-27 ENCOUNTER — Inpatient Hospital Stay (HOSPITAL_BASED_OUTPATIENT_CLINIC_OR_DEPARTMENT_OTHER): Admitting: Oncology

## 2023-10-27 ENCOUNTER — Other Ambulatory Visit (HOSPITAL_BASED_OUTPATIENT_CLINIC_OR_DEPARTMENT_OTHER): Payer: Self-pay

## 2023-10-27 ENCOUNTER — Inpatient Hospital Stay: Attending: Oncology

## 2023-10-27 ENCOUNTER — Inpatient Hospital Stay

## 2023-10-27 VITALS — BP 162/87 | HR 96 | Temp 97.8°F | Resp 18

## 2023-10-27 VITALS — BP 137/77 | HR 100 | Temp 97.9°F | Resp 18 | Ht 59.0 in | Wt 123.0 lb

## 2023-10-27 DIAGNOSIS — C481 Malignant neoplasm of specified parts of peritoneum: Secondary | ICD-10-CM

## 2023-10-27 DIAGNOSIS — Z5111 Encounter for antineoplastic chemotherapy: Secondary | ICD-10-CM | POA: Insufficient documentation

## 2023-10-27 LAB — CBC WITH DIFFERENTIAL (CANCER CENTER ONLY)
Abs Immature Granulocytes: 0.03 K/uL (ref 0.00–0.07)
Basophils Absolute: 0.1 K/uL (ref 0.0–0.1)
Basophils Relative: 1 %
Eosinophils Absolute: 0.1 K/uL (ref 0.0–0.5)
Eosinophils Relative: 2 %
HCT: 35.4 % — ABNORMAL LOW (ref 36.0–46.0)
Hemoglobin: 11.6 g/dL — ABNORMAL LOW (ref 12.0–15.0)
Immature Granulocytes: 0 %
Lymphocytes Relative: 17 %
Lymphs Abs: 1.1 K/uL (ref 0.7–4.0)
MCH: 36.6 pg — ABNORMAL HIGH (ref 26.0–34.0)
MCHC: 32.8 g/dL (ref 30.0–36.0)
MCV: 111.7 fL — ABNORMAL HIGH (ref 80.0–100.0)
Monocytes Absolute: 0.8 K/uL (ref 0.1–1.0)
Monocytes Relative: 12 %
Neutro Abs: 4.5 K/uL (ref 1.7–7.7)
Neutrophils Relative %: 68 %
Platelet Count: 311 K/uL (ref 150–400)
RBC: 3.17 MIL/uL — ABNORMAL LOW (ref 3.87–5.11)
RDW: 15.4 % (ref 11.5–15.5)
WBC Count: 6.7 K/uL (ref 4.0–10.5)
nRBC: 0 % (ref 0.0–0.2)

## 2023-10-27 LAB — CMP (CANCER CENTER ONLY)
ALT: 11 U/L (ref 0–44)
AST: 26 U/L (ref 15–41)
Albumin: 4.1 g/dL (ref 3.5–5.0)
Alkaline Phosphatase: 95 U/L (ref 38–126)
Anion gap: 12 (ref 5–15)
BUN: 13 mg/dL (ref 8–23)
CO2: 25 mmol/L (ref 22–32)
Calcium: 9.7 mg/dL (ref 8.9–10.3)
Chloride: 103 mmol/L (ref 98–111)
Creatinine: 0.66 mg/dL (ref 0.44–1.00)
GFR, Estimated: >60 mL/min (ref >60)
Glucose, Bld: 144 mg/dL — ABNORMAL HIGH (ref 70–99)
Potassium: 3.9 mmol/L (ref 3.5–5.1)
Sodium: 139 mmol/L (ref 135–145)
Total Bilirubin: 0.4 mg/dL (ref 0.0–1.2)
Total Protein: 7.1 g/dL (ref 6.5–8.1)

## 2023-10-27 MED ORDER — MAGNESIUM OXIDE 400 MG PO TABS
400.0000 mg | ORAL_TABLET | Freq: Two times a day (BID) | ORAL | 2 refills | Status: DC
  Filled 2023-10-27 – 2023-11-26 (×2): qty 60, 30d supply, fill #0

## 2023-10-27 MED ORDER — SODIUM CHLORIDE 0.9 % IV SOLN
318.0000 mg | Freq: Once | INTRAVENOUS | Status: AC
  Administered 2023-10-27: 320 mg via INTRAVENOUS
  Filled 2023-10-27: qty 32

## 2023-10-27 MED ORDER — PALONOSETRON HCL INJECTION 0.25 MG/5ML
0.2500 mg | Freq: Once | INTRAVENOUS | Status: AC
  Administered 2023-10-27: 0.25 mg via INTRAVENOUS
  Filled 2023-10-27: qty 5

## 2023-10-27 MED ORDER — DEXAMETHASONE SODIUM PHOSPHATE 10 MG/ML IJ SOLN
10.0000 mg | Freq: Once | INTRAMUSCULAR | Status: AC
  Administered 2023-10-27: 10 mg via INTRAVENOUS
  Filled 2023-10-27: qty 1

## 2023-10-27 MED ORDER — SODIUM CHLORIDE 0.9 % IV SOLN
175.0000 mg/m2 | Freq: Once | INTRAVENOUS | Status: AC
  Administered 2023-10-27: 264 mg via INTRAVENOUS
  Filled 2023-10-27: qty 44

## 2023-10-27 MED ORDER — DIPHENHYDRAMINE HCL 50 MG/ML IJ SOLN
25.0000 mg | Freq: Once | INTRAMUSCULAR | Status: AC
  Administered 2023-10-27: 25 mg via INTRAVENOUS
  Filled 2023-10-27: qty 1

## 2023-10-27 MED ORDER — SODIUM CHLORIDE 0.9 % IV SOLN
150.0000 mg | Freq: Once | INTRAVENOUS | Status: AC
  Administered 2023-10-27: 150 mg via INTRAVENOUS
  Filled 2023-10-27: qty 150

## 2023-10-27 MED ORDER — SODIUM CHLORIDE 0.9 % IV SOLN: INTRAVENOUS | Status: DC

## 2023-10-27 MED ORDER — FAMOTIDINE IN NACL 20-0.9 MG/50ML-% IV SOLN
20.0000 mg | Freq: Once | INTRAVENOUS | Status: AC
  Administered 2023-10-27: 20 mg via INTRAVENOUS
  Filled 2023-10-27: qty 50

## 2023-10-27 MED FILL — Diphenoxylate w/ Atropine Tab 2.5-0.025 MG: ORAL | 30 days supply | Qty: 60 | Fill #0 | Status: CN

## 2023-10-27 MED FILL — Mirtazapine Tab 7.5 MG: ORAL | 30 days supply | Qty: 30 | Fill #0 | Status: CN

## 2023-10-27 NOTE — Progress Notes (Signed)
 [facility] OFFICE PROGRESS NOTE   Diagnosis: Peritoneal cancer  INTERVAL HISTORY:   Ms. Doffing returns as scheduled.  She completed another cycle of paclitaxel /carboplatin  10/06/2023.  No nausea/vomiting or symptom of an allergic reaction.  She empties the ileostomy bag 4-5 times per day.  The stool is mostly loose.  She has a mild numbness in the feet.  This has not changed.  Objective:  Vital signs in last 24 hours:  Blood pressure 137/77, pulse 100, temperature 97.9 F (36.6 C), temperature source Temporal, resp. rate 18, height 4' 11 (1.499 m), weight 123 lb (55.8 kg), SpO2 98%.    HEENT: No thrush or ulcers Resp: Lungs clear bilaterally Cardio: Regular rate and rhythm GI: No hepatosplenomegaly, right abdomen ileostomy with loose brown stool Vascular: No leg edema  Portacath/PICC-without erythema  Lab Results:  Lab Results  Component Value Date   WBC 6.7 10/27/2023   HGB 11.6 (L) 10/27/2023   HCT 35.4 (L) 10/27/2023   MCV 111.7 (H) 10/27/2023   PLT 311 10/27/2023   NEUTROABS 4.5 10/27/2023    CMP  Lab Results  Component Value Date   NA 139 10/27/2023   K 3.9 10/27/2023   CL 103 10/27/2023   CO2 25 10/27/2023   GLUCOSE 144 (H) 10/27/2023   BUN 13 10/27/2023   CREATININE 0.66 10/27/2023   CALCIUM  9.7 10/27/2023   PROT 7.1 10/27/2023   ALBUMIN  4.1 10/27/2023   AST 26 10/27/2023   ALT 11 10/27/2023   ALKPHOS 95 10/27/2023   BILITOT 0.4 10/27/2023   GFRNONAA >60 10/27/2023    Lab Results  Component Value Date   CEA1 2.5 10/18/2021    Medications: I have reviewed the patient's current medications.   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   Bone lesions concerning for metastases, right lung nodule Laparoscopy, diverting loop ileostomy, gastrostomy tube placement, peritoneal biopsy 10/16/2021, no evidence of a primary tumor site at the appendix, ovaries, or uterus.  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  CDX2, CK20, and GATA3 negative.  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 Foundation 1-MSS, tumor mutation burden 5, HRD positive-LOH score 34.1% 08/11/2021 CA125 818 CT chest 11/03/2021-10 mm right lower lobe nodule, scattered tiny pulmonary nodules, peritoneal carcinomatosis Cycle 1 Taxol /carboplatin  11/12/2021 Cycle 2 Taxol /carboplatin  12/03/2021 Cycle 3 Taxol /carboplatin  12/24/2021 Bone scan 01/05/2022-negative for metastatic disease Cycle 4 Taxol /carboplatin  01/13/2022 CTs 01/22/2022-reduction in peritoneal carcinomatosis, no evidence of progressive disease stable bilateral lower lobe pulmonary nodules Cycle 5 Taxol /carboplatin  02/03/2022 Cycle 6 Taxol /carboplatin  02/24/2022 CT abdomen/pelvis 04/19/2022-slight decrease in peritoneal carcinomatosis with residual lesions 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 Olaparib  05/14/2022 Bevacizumab  every 3 weeks 05/19/2022 Olaparib  held on hospital admission 05/29/2022 Olaparib  resumed 06/09/2022, dose reduced to 150 mg twice daily CT abdomen/pelvis 09/06/2022-decrease in size and conspicuity of peritoneal carcinomatosis, unchanged left adrenal nodule Olaparib /bevacizumab  continued CT abdomen/pelvis 03/16/2023: No progressive disease, stable ill-defined soft tissue in the gastrocolic ligament, stable right lower lobe nodule Olaparib /bevacizumab  continued CT abdomen/pelvis 08/27/2023: New right hydroureteronephrosis to the level of the pelvic inlet/13 mm  enhancing nodule, soft tissue density at the gastrocolic ligament measures smaller in a previously  identified nodule adjacent to this is not identified, left adrenal nodule is stable, stable right lower lobe nodule Olaparib /bevacizumab  discontinued Cycle 1 Taxol /carboplatin  09/16/2023, Fulphila  Cycle 2 Taxol /carboplatin  10/06/2023, Fulphila  Cycle 3 Taxol /carboplatin  10/27/2023, for [place] 2.   Abdominal distention/pain and diarrhea secondary to #1 3.   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  Lumpectomy and adjuvant CMF x8 followed by radiation 4. Hypertension 5. Hypothyroidism 6. Barrett's esophagus 7. Family history of multiple cancers including appendix, colon, and lung cancer 8.  Hospital admission 11/05/2021 with dehydration/prerenal azotemia secondary to nausea and high output ileostomy 9.  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 Admission 05/29/2022 with dehydration and acute renal injury    Disposition: Mr Langner appear stable.  She has completed 2 cycles of salvage therapy with Taxol /carboplatin .  She has tolerated the chemotherapy well.  We will follow-up on the CA125 from today.  She will complete another cycle of chemotherapy today.  She is followed by urology for right hydroureteronephrosis.  A ureter stent has not been placed and the creatinine remains normal.  Will follow-up on the most recent note from urology.  Ms. Bains will return for an office visit and chemotherapy in 3 weeks.  Arley Hof, MD  10/27/2023  10:15 AM

## 2023-10-27 NOTE — Patient Instructions (Signed)

## 2023-10-27 NOTE — Patient Instructions (Signed)
 [facility] - A DEPT OF [facility]  Discharge Instructions: Thank you for choosing [facility] to provide your oncology and hematology care.   If you have a lab appointment with the Cancer Center, please go directly to the Cancer Center and check in at the registration area.   Wear comfortable clothing and clothing appropriate for easy access to any Portacath or PICC line.   We strive to give you quality time with your provider. You may need to reschedule your appointment if you arrive late (15 or more minutes).  Arriving late affects you and other patients whose appointments are after yours.  Also, if you miss three or more appointments without notifying the office, you may be dismissed from the clinic at the provider's discretion.      For prescription refill requests, have your pharmacy contact our office and allow 72 hours for refills to be completed.    Today you received the following chemotherapy and/or immunotherapy agents: Taxol  and carboplatin .     To help prevent nausea and vomiting after your treatment, we encourage you to take your nausea medication as directed.  BELOW ARE SYMPTOMS THAT SHOULD BE REPORTED IMMEDIATELY: *FEVER GREATER THAN 100.4 F (38 C) OR HIGHER *CHILLS OR SWEATING *NAUSEA AND VOMITING THAT IS NOT CONTROLLED WITH YOUR NAUSEA MEDICATION *UNUSUAL SHORTNESS OF BREATH *UNUSUAL BRUISING OR BLEEDING *URINARY PROBLEMS (pain or burning when urinating, or frequent urination) *BOWEL PROBLEMS (unusual diarrhea, constipation, pain near the anus) TENDERNESS IN MOUTH AND THROAT WITH OR WITHOUT PRESENCE OF ULCERS (sore throat, sores in mouth, or a toothache) UNUSUAL RASH, SWELLING OR PAIN  UNUSUAL VAGINAL DISCHARGE OR ITCHING   Items with * indicate a potential emergency and should be followed up as soon as possible or go to the Emergency Department if any problems should occur.  Please show the CHEMOTHERAPY ALERT CARD or  IMMUNOTHERAPY ALERT CARD at check-in to the Emergency Department and triage nurse.  Should you have questions after your visit or need to cancel or reschedule your appointment, please contact [facility] - A DEPT OF [facility]  Dept: [phone]  and follow the prompts.  Office hours are 8:00 a.m. to 4:30 p.m. Monday - Friday. Please note that voicemails left after 4:00 p.m. may not be returned until the following business day.  We are closed weekends and major holidays. You have access to a nurse at all times for urgent questions. Please call the main number to the clinic Dept: [phone] and follow the prompts.   For any non-urgent questions, you may also contact your provider using MyChart. We now offer e-Visits for anyone 58 and older to request care online for non-urgent symptoms. For details visit [email or web address].   Also download the MyChart app! Go to the app store, search MyChart, open the app, select [place], and log in with your MyChart username and password. ________________________________________________________ Paclitaxel  Injection What is this medication? PACLITAXEL  (PAK li TAX el) treats some types of cancer. It works by slowing down the growth of cancer cells. This medicine may be used for other purposes; ask your health care provider or pharmacist if you have questions. COMMON BRAND NAME(S): Onxol, Taxol  What should I tell my care team before I take this medication? They need to know if you have any of these conditions: Heart disease Liver disease Low white blood cell levels An unusual or allergic reaction to paclitaxel , other medications, foods, dyes, or preservatives  If you or your partner are pregnant or trying to get pregnant Breast-feeding How should I use this medication? This medication is injected into a vein. It is given by your care team in a hospital or clinic setting. Talk to your care team about the use of this  medication in children. While it may be given to children for selected conditions, precautions do apply. Overdosage: If you think you have taken too much of this medicine contact a poison control center or emergency room at once. NOTE: This medicine is only for you. Do not share this medicine with others. What if I miss a dose? Keep appointments for follow-up doses. It is important not to miss your dose. Call your care team if you are unable to keep an appointment. What may interact with this medication? Do not take this medication with any of the following: Live virus vaccines Other medications may affect the way this medication works. Talk with your care team about all of the medications you take. They may suggest changes to your treatment plan to lower the risk of side effects and to make sure your medications work as intended. This list may not describe all possible interactions. Give your health care provider a list of all the medicines, herbs, non-prescription drugs, or dietary supplements you use. Also tell them if you smoke, drink alcohol, or use illegal drugs. Some items may interact with your medicine. What should I watch for while using this medication? Your condition will be monitored carefully while you are receiving this medication. You may need blood work while taking this medication. This medication may make you feel generally unwell. This is not uncommon as chemotherapy can affect healthy cells as well as cancer cells. Report any side effects. Continue your course of treatment even though you feel ill unless your care team tells you to stop. This medication can cause serious allergic reactions. To reduce the risk, your care team may give you other medications to take before receiving this one. Be sure to follow the directions from your care team. This medication may increase your risk of getting an infection. Call your care team for advice if you get a fever, chills, sore throat, or  other symptoms of a cold or flu. Do not treat yourself. Try to avoid being around people who are sick. This medication may increase your risk to bruise or bleed. Call your care team if you notice any unusual bleeding. Be careful brushing or flossing your teeth or using a toothpick because you may get an infection or bleed more easily. If you have any dental work done, tell your dentist you are receiving this medication. Talk to your care team if you may be pregnant. Serious birth defects can occur if you take this medication during pregnancy. Talk to your care team before breastfeeding. Changes to your treatment plan may be needed. What side effects may I notice from receiving this medication? Side effects that you should report to your care team as soon as possible: Allergic reactions--skin rash, itching, hives, swelling of the face, lips, tongue, or throat Heart rhythm changes--fast or irregular heartbeat, dizziness, feeling faint or lightheaded, chest pain, trouble breathing Increase in blood pressure Infection--fever, chills, cough, sore throat, wounds that don't heal, pain or trouble when passing urine, general feeling of discomfort or being unwell Low blood pressure--dizziness, feeling faint or lightheaded, blurry vision Low red blood cell level--unusual weakness or fatigue, dizziness, headache, trouble breathing Painful swelling, warmth, or redness of the skin, blisters  or sores at the infusion site Pain, tingling, or numbness in the hands or feet Slow heartbeat--dizziness, feeling faint or lightheaded, confusion, trouble breathing, unusual weakness or fatigue Unusual bruising or bleeding Side effects that usually do not require medical attention (report to your care team if they continue or are bothersome): Diarrhea Hair loss Joint pain Loss of appetite Muscle pain Nausea Vomiting This list may not describe all possible side effects. Call your doctor for medical advice about side  effects. You may report side effects to FDA at 1-800-FDA-1088. Where should I keep my medication? This medication is given in a hospital or clinic. It will not be stored at home. NOTE: This sheet is a summary. It may not cover all possible information. If you have questions about this medicine, talk to your doctor, pharmacist, or health care provider.  2024 Elsevier/Gold Standard (2021-06-30 00:00:00) ____________________________________________________ Carboplatin  Injection What is this medication? CARBOPLATIN  (KAR boe pla tin) treats some types of cancer. It works by slowing down the growth of cancer cells. This medicine may be used for other purposes; ask your health care provider or pharmacist if you have questions. COMMON BRAND NAME(S): Paraplatin  What should I tell my care team before I take this medication? They need to know if you have any of these conditions: Blood disorders Hearing problems Kidney disease Recent or ongoing radiation therapy An unusual or allergic reaction to carboplatin , cisplatin, other medications, foods, dyes, or preservatives Pregnant or trying to get pregnant Breast-feeding How should I use this medication? This medication is injected into a vein. It is given by your care team in a hospital or clinic setting. Talk to your care team about the use of this medication in children. Special care may be needed. Overdosage: If you think you have taken too much of this medicine contact a poison control center or emergency room at once. NOTE: This medicine is only for you. Do not share this medicine with others. What if I miss a dose? Keep appointments for follow-up doses. It is important not to miss your dose. Call your care team if you are unable to keep an appointment. What may interact with this medication? Medications for seizures Some antibiotics, such as amikacin, gentamicin, neomycin, streptomycin, tobramycin Vaccines This list may not describe all  possible interactions. Give your health care provider a list of all the medicines, herbs, non-prescription drugs, or dietary supplements you use. Also tell them if you smoke, drink alcohol, or use illegal drugs. Some items may interact with your medicine. What should I watch for while using this medication? Your condition will be monitored carefully while you are receiving this medication. You may need blood work while taking this medication. This medication may make you feel generally unwell. This is not uncommon, as chemotherapy can affect healthy cells as well as cancer cells. Report any side effects. Continue your course of treatment even though you feel ill unless your care team tells you to stop. In some cases, you may be given additional medications to help with side effects. Follow all directions for their use. This medication may increase your risk of getting an infection. Call your care team for advice if you get a fever, chills, sore throat, or other symptoms of a cold or flu. Do not treat yourself. Try to avoid being around people who are sick. Avoid taking medications that contain aspirin, acetaminophen , ibuprofen, naproxen, or ketoprofen unless instructed by your care team. These medications may hide a fever. Be careful brushing or flossing your  teeth or using a toothpick because you may get an infection or bleed more easily. If you have any dental work done, tell your dentist you are receiving this medication. Talk to your care team if you wish to become pregnant or think you might be pregnant. This medication can cause serious birth defects. Talk to your care team about effective forms of contraception. Do not breast-feed while taking this medication. What side effects may I notice from receiving this medication? Side effects that you should report to your care team as soon as possible: Allergic reactions--skin rash, itching, hives, swelling of the face, lips, tongue, or  throat Infection--fever, chills, cough, sore throat, wounds that don't heal, pain or trouble when passing urine, general feeling of discomfort or being unwell Low red blood cell level--unusual weakness or fatigue, dizziness, headache, trouble breathing Pain, tingling, or numbness in the hands or feet, muscle weakness, change in vision, confusion or trouble speaking, loss of balance or coordination, trouble walking, seizures Unusual bruising or bleeding Side effects that usually do not require medical attention (report to your care team if they continue or are bothersome): Hair loss Nausea Unusual weakness or fatigue Vomiting This list may not describe all possible side effects. Call your doctor for medical advice about side effects. You may report side effects to FDA at 1-800-FDA-1088. Where should I keep my medication? This medication is given in a hospital or clinic. It will not be stored at home. NOTE: This sheet is a summary. It may not cover all possible information. If you have questions about this medicine, talk to your doctor, pharmacist, or health care provider.  2024 Elsevier/Gold Standard (2021-06-02 00:00:00)

## 2023-10-27 NOTE — Progress Notes (Signed)
 Patient seen by Dr. Arley Hof today  Vitals are within treatment parameters:Yes   Labs are within treatment parameters: Yes   Treatment plan has been signed: Yes   Per physician team, Patient is ready for treatment and there are NO modifications to the treatment plan.

## 2023-10-27 NOTE — Progress Notes (Signed)
 Port access was done by Lucie BROCKS, RN from ED.

## 2023-10-31 LAB — CA 125: Cancer Antigen (CA) 125: 65.8 U/mL — ABNORMAL HIGH (ref 0.0–38.1)

## 2023-11-01 ENCOUNTER — Other Ambulatory Visit: Payer: Self-pay

## 2023-11-03 DIAGNOSIS — N130 Hydronephrosis with ureteropelvic junction obstruction: Secondary | ICD-10-CM | POA: Diagnosis not present

## 2023-11-05 ENCOUNTER — Other Ambulatory Visit (HOSPITAL_BASED_OUTPATIENT_CLINIC_OR_DEPARTMENT_OTHER): Payer: Self-pay

## 2023-11-05 MED FILL — Diphenoxylate w/ Atropine Tab 2.5-0.025 MG: ORAL | 30 days supply | Qty: 60 | Fill #0 | Status: AC

## 2023-11-09 DIAGNOSIS — Z932 Ileostomy status: Secondary | ICD-10-CM | POA: Diagnosis not present

## 2023-11-13 ENCOUNTER — Other Ambulatory Visit: Payer: Self-pay | Admitting: Oncology

## 2023-11-17 ENCOUNTER — Other Ambulatory Visit (HOSPITAL_BASED_OUTPATIENT_CLINIC_OR_DEPARTMENT_OTHER): Payer: Self-pay

## 2023-11-17 ENCOUNTER — Inpatient Hospital Stay

## 2023-11-17 ENCOUNTER — Encounter: Payer: Self-pay | Admitting: Nurse Practitioner

## 2023-11-17 ENCOUNTER — Inpatient Hospital Stay (HOSPITAL_BASED_OUTPATIENT_CLINIC_OR_DEPARTMENT_OTHER): Admitting: Nurse Practitioner

## 2023-11-17 VITALS — BP 146/88 | HR 100 | Temp 98.3°F | Resp 18 | Ht 59.0 in | Wt 125.6 lb

## 2023-11-17 VITALS — BP 164/83 | HR 98 | Temp 98.2°F | Resp 18

## 2023-11-17 DIAGNOSIS — C481 Malignant neoplasm of specified parts of peritoneum: Secondary | ICD-10-CM

## 2023-11-17 DIAGNOSIS — Z5111 Encounter for antineoplastic chemotherapy: Secondary | ICD-10-CM | POA: Diagnosis not present

## 2023-11-17 LAB — CBC WITH DIFFERENTIAL (CANCER CENTER ONLY)
Abs Immature Granulocytes: 0.01 K/uL (ref 0.00–0.07)
Basophils Absolute: 0.1 K/uL (ref 0.0–0.1)
Basophils Relative: 1 %
Eosinophils Absolute: 0.2 K/uL (ref 0.0–0.5)
Eosinophils Relative: 3 %
HCT: 36.1 % (ref 36.0–46.0)
Hemoglobin: 11.7 g/dL — ABNORMAL LOW (ref 12.0–15.0)
Immature Granulocytes: 0 %
Lymphocytes Relative: 18 %
Lymphs Abs: 1 K/uL (ref 0.7–4.0)
MCH: 35.9 pg — ABNORMAL HIGH (ref 26.0–34.0)
MCHC: 32.4 g/dL (ref 30.0–36.0)
MCV: 110.7 fL — ABNORMAL HIGH (ref 80.0–100.0)
Monocytes Absolute: 0.5 K/uL (ref 0.1–1.0)
Monocytes Relative: 9 %
Neutro Abs: 3.9 K/uL (ref 1.7–7.7)
Neutrophils Relative %: 69 %
Platelet Count: 211 K/uL (ref 150–400)
RBC: 3.26 MIL/uL — ABNORMAL LOW (ref 3.87–5.11)
RDW: 14.2 % (ref 11.5–15.5)
WBC Count: 5.7 K/uL (ref 4.0–10.5)
nRBC: 0 % (ref 0.0–0.2)

## 2023-11-17 LAB — CMP (CANCER CENTER ONLY)
ALT: 17 U/L (ref 0–44)
AST: 27 U/L (ref 15–41)
Albumin: 3.9 g/dL (ref 3.5–5.0)
Alkaline Phosphatase: 85 U/L (ref 38–126)
Anion gap: 12 (ref 5–15)
BUN: 15 mg/dL (ref 8–23)
CO2: 26 mmol/L (ref 22–32)
Calcium: 9.6 mg/dL (ref 8.9–10.3)
Chloride: 103 mmol/L (ref 98–111)
Creatinine: 0.69 mg/dL (ref 0.44–1.00)
GFR, Estimated: >60 mL/min (ref >60)
Glucose, Bld: 110 mg/dL — ABNORMAL HIGH (ref 70–99)
Potassium: 4.2 mmol/L (ref 3.5–5.1)
Sodium: 140 mmol/L (ref 135–145)
Total Bilirubin: 0.5 mg/dL (ref 0.0–1.2)
Total Protein: 7.2 g/dL (ref 6.5–8.1)

## 2023-11-17 MED ORDER — SODIUM CHLORIDE 0.9 % IV SOLN
150.0000 mg | Freq: Once | INTRAVENOUS | Status: AC
  Administered 2023-11-17: 150 mg via INTRAVENOUS
  Filled 2023-11-17: qty 150

## 2023-11-17 MED ORDER — DIPHENHYDRAMINE HCL 50 MG/ML IJ SOLN
25.0000 mg | Freq: Once | INTRAMUSCULAR | Status: AC
  Administered 2023-11-17: 25 mg via INTRAVENOUS
  Filled 2023-11-17: qty 1

## 2023-11-17 MED ORDER — SODIUM CHLORIDE 0.9 % IV SOLN
318.0000 mg | Freq: Once | INTRAVENOUS | Status: AC
  Administered 2023-11-17: 320 mg via INTRAVENOUS
  Filled 2023-11-17: qty 32

## 2023-11-17 MED ORDER — POTASSIUM CHLORIDE ER 10 MEQ PO CPCR
10.0000 meq | ORAL_CAPSULE | Freq: Two times a day (BID) | ORAL | 1 refills | Status: DC
  Filled 2023-11-17: qty 60, 30d supply, fill #0
  Filled 2023-12-31: qty 60, 30d supply, fill #1

## 2023-11-17 MED ORDER — PALONOSETRON HCL INJECTION 0.25 MG/5ML
0.2500 mg | Freq: Once | INTRAVENOUS | Status: AC
  Administered 2023-11-17: 0.25 mg via INTRAVENOUS
  Filled 2023-11-17: qty 5

## 2023-11-17 MED ORDER — SODIUM CHLORIDE 0.9 % IV SOLN
175.0000 mg/m2 | Freq: Once | INTRAVENOUS | Status: AC
  Administered 2023-11-17: 264 mg via INTRAVENOUS
  Filled 2023-11-17: qty 44

## 2023-11-17 MED ORDER — FAMOTIDINE IN NACL 20-0.9 MG/50ML-% IV SOLN
20.0000 mg | Freq: Once | INTRAVENOUS | Status: AC
  Administered 2023-11-17: 20 mg via INTRAVENOUS
  Filled 2023-11-17: qty 50

## 2023-11-17 MED ORDER — DEXAMETHASONE SODIUM PHOSPHATE 10 MG/ML IJ SOLN
10.0000 mg | Freq: Once | INTRAMUSCULAR | Status: AC
  Administered 2023-11-17: 10 mg via INTRAVENOUS
  Filled 2023-11-17: qty 1

## 2023-11-17 MED ORDER — SODIUM CHLORIDE 0.9 % IV SOLN: INTRAVENOUS | Status: DC

## 2023-11-17 NOTE — Progress Notes (Signed)
 Patient seen by Olam Ned NP today  Vitals are within treatment parameters:Yes   Labs are within treatment parameters: Yes   Treatment plan has been signed: Yes   Per physician team, Patient is ready for treatment and there are NO modifications to the treatment plan.

## 2023-11-17 NOTE — Patient Instructions (Signed)

## 2023-11-17 NOTE — Progress Notes (Signed)
 Nancy Harrison   Diagnosis: Peritoneal cancer  INTERVAL HISTORY:   Nancy Harrison returns as scheduled.  She completed another cycle of paclitaxel /carboplatin  10/27/2023.  She denies nausea/vomiting.  No mouth sores.  Occasional loose stool.  She empties the ileostomy bag 3-4 times a day when it is partially full.  No numbness or tingling in the hands or feet.  No abdominal pain.  No bleeding.  Objective:  Vital signs in last 24 hours:  Blood pressure (!) 146/88, pulse 100, temperature 98.3 F (36.8 C), temperature source Temporal, resp. rate 18, height 4' 11 (1.499 m), weight 125 lb 9.6 oz (57 kg), SpO2 98%.    HEENT: No thrush or ulcers. Resp: Lungs clear bilaterally. Cardio: Regular rate and rhythm. GI: No hepatosplenomegaly.  Right abdomen ileostomy with partially formed brown stool in the collection bag. Vascular: No leg edema. Neuro: Alert and oriented. Skin: No rash. Port-A-Cath without erythema.  Lab Results:  Lab Results  Component Value Date   WBC 5.7 11/17/2023   HGB 11.7 (L) 11/17/2023   HCT 36.1 11/17/2023   MCV 110.7 (H) 11/17/2023   PLT 211 11/17/2023   NEUTROABS 3.9 11/17/2023    Imaging:  No results found.  Medications: I have reviewed the patient's current medications.  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  Bone lesions concerning for metastases, right lung nodule Laparoscopy, diverting loop ileostomy, gastrostomy tube placement, peritoneal biopsy 10/16/2021, no evidence of a primary tumor site at the appendix, ovaries, or uterus.  Diffuse peritoneal carcinomatosis Pathology of the lesser curvature of stomach carcinomatosis  biopsy-metastatic poorly differentiated carcinoma, CK7, PAX8, WT1, and p53 positive with focal labeling for p16.  CDX2, CK20, and GATA3 negative.  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 Foundation 1-MSS, tumor mutation burden 5, HRD positive-LOH score 34.1% 08/11/2021 CA125 818 CT chest 11/03/2021-10 mm right lower lobe nodule, scattered tiny pulmonary nodules, peritoneal carcinomatosis Cycle 1 Taxol /carboplatin  11/12/2021 Cycle 2 Taxol /carboplatin  12/03/2021 Cycle 3 Taxol /carboplatin  12/24/2021 Bone scan 01/05/2022-negative for metastatic disease Cycle 4 Taxol /carboplatin  01/13/2022 CTs 01/22/2022-reduction in peritoneal carcinomatosis, no evidence of progressive disease stable bilateral lower lobe pulmonary nodules Cycle 5 Taxol /carboplatin  02/03/2022 Cycle 6 Taxol /carboplatin  02/24/2022 CT abdomen/pelvis 04/19/2022-slight decrease in peritoneal carcinomatosis with residual lesions 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 Olaparib  05/14/2022 Bevacizumab  every 3 weeks 05/19/2022 Olaparib  held on hospital admission 05/29/2022 Olaparib  resumed 06/09/2022, dose reduced to 150 mg twice daily CT abdomen/pelvis 09/06/2022-decrease in size and conspicuity of peritoneal carcinomatosis, unchanged left adrenal nodule Olaparib /bevacizumab  continued CT abdomen/pelvis 03/16/2023: No progressive disease, stable ill-defined soft tissue in the gastrocolic ligament, stable right lower lobe nodule Olaparib /bevacizumab  continued CT abdomen/pelvis 08/27/2023: New right hydroureteronephrosis to the level of the pelvic inlet/13 mm enhancing nodule, soft tissue density at the gastrocolic ligament measures smaller in a previously identified nodule adjacent to this is not identified, left adrenal nodule is stable, stable right lower lobe nodule Olaparib /bevacizumab  discontinued Cycle 1 Taxol /carboplatin  09/16/2023, Fulphila  Cycle 2 Taxol /carboplatin   10/06/2023, Fulphila  Cycle 3 Taxol /carboplatin  10/27/2023, Fulphila  2.   Abdominal distention/pain and diarrhea secondary to #1 3.   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  Lumpectomy and adjuvant CMF x8 followed by radiation 4. Hypertension 5. Hypothyroidism 6.  Barrett's esophagus 7. Family history of multiple cancers including appendix, colon, and lung cancer 8.  Hospital admission 11/05/2021 with dehydration/prerenal azotemia secondary to nausea and high output ileostomy 9.  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 Admission 05/29/2022 with dehydration and acute renal injury      Disposition: Nancy Harrison appears stable.  She has completed 3 cycles of Taxol /carboplatin .  She is tolerating treatment well.  Most recent CA125 was lower.  Plan to proceed with cycle 4 today as scheduled.  Restaging continues after cycle 6.  CBC and chemistry panel reviewed.  Labs adequate for treatment.  She will return for follow-up and cycle 5 chemotherapy in 3 weeks.  We are available to see her sooner if needed.    Olam Ned ANP/GNP-BC   11/17/2023  9:55 AM

## 2023-11-17 NOTE — Patient Instructions (Signed)
 [facility] - A DEPT OF [facility]  Discharge Instructions: Thank you for choosing [facility] to provide your oncology and hematology care.   If you have a lab appointment with the Cancer Center, please go directly to the Cancer Center and check in at the registration area.   Wear comfortable clothing and clothing appropriate for easy access to any Portacath or PICC line.   We strive to give you quality time with your provider. You may need to reschedule your appointment if you arrive late (15 or more minutes).  Arriving late affects you and other patients whose appointments are after yours.  Also, if you miss three or more appointments without notifying the office, you may be dismissed from the clinic at the provider's discretion.      For prescription refill requests, have your pharmacy contact our office and allow 72 hours for refills to be completed.    Today you received the following chemotherapy and/or immunotherapy agents: Taxol  and carboplatin .     To help prevent nausea and vomiting after your treatment, we encourage you to take your nausea medication as directed.  BELOW ARE SYMPTOMS THAT SHOULD BE REPORTED IMMEDIATELY: *FEVER GREATER THAN 100.4 F (38 C) OR HIGHER *CHILLS OR SWEATING *NAUSEA AND VOMITING THAT IS NOT CONTROLLED WITH YOUR NAUSEA MEDICATION *UNUSUAL SHORTNESS OF BREATH *UNUSUAL BRUISING OR BLEEDING *URINARY PROBLEMS (pain or burning when urinating, or frequent urination) *BOWEL PROBLEMS (unusual diarrhea, constipation, pain near the anus) TENDERNESS IN MOUTH AND THROAT WITH OR WITHOUT PRESENCE OF ULCERS (sore throat, sores in mouth, or a toothache) UNUSUAL RASH, SWELLING OR PAIN  UNUSUAL VAGINAL DISCHARGE OR ITCHING   Items with * indicate a potential emergency and should be followed up as soon as possible or go to the Emergency Department if any problems should occur.  Please show the CHEMOTHERAPY ALERT CARD or  IMMUNOTHERAPY ALERT CARD at check-in to the Emergency Department and triage nurse.  Should you have questions after your visit or need to cancel or reschedule your appointment, please contact [facility] - A DEPT OF [facility]  Dept: [phone]  and follow the prompts.  Office hours are 8:00 a.m. to 4:30 p.m. Monday - Friday. Please note that voicemails left after 4:00 p.m. may not be returned until the following business day.  We are closed weekends and major holidays. You have access to a nurse at all times for urgent questions. Please call the main number to the clinic Dept: [phone] and follow the prompts.   For any non-urgent questions, you may also contact your provider using MyChart. We now offer e-Visits for anyone 58 and older to request care online for non-urgent symptoms. For details visit [email or web address].   Also download the MyChart app! Go to the app store, search MyChart, open the app, select [place], and log in with your MyChart username and password. ________________________________________________________ Paclitaxel  Injection What is this medication? PACLITAXEL  (PAK li TAX el) treats some types of cancer. It works by slowing down the growth of cancer cells. This medicine may be used for other purposes; ask your health care provider or pharmacist if you have questions. COMMON BRAND NAME(S): Onxol, Taxol  What should I tell my care team before I take this medication? They need to know if you have any of these conditions: Heart disease Liver disease Low white blood cell levels An unusual or allergic reaction to paclitaxel , other medications, foods, dyes, or preservatives  If you or your partner are pregnant or trying to get pregnant Breast-feeding How should I use this medication? This medication is injected into a vein. It is given by your care team in a hospital or clinic setting. Talk to your care team about the use of this  medication in children. While it may be given to children for selected conditions, precautions do apply. Overdosage: If you think you have taken too much of this medicine contact a poison control center or emergency room at once. NOTE: This medicine is only for you. Do not share this medicine with others. What if I miss a dose? Keep appointments for follow-up doses. It is important not to miss your dose. Call your care team if you are unable to keep an appointment. What may interact with this medication? Do not take this medication with any of the following: Live virus vaccines Other medications may affect the way this medication works. Talk with your care team about all of the medications you take. They may suggest changes to your treatment plan to lower the risk of side effects and to make sure your medications work as intended. This list may not describe all possible interactions. Give your health care provider a list of all the medicines, herbs, non-prescription drugs, or dietary supplements you use. Also tell them if you smoke, drink alcohol, or use illegal drugs. Some items may interact with your medicine. What should I watch for while using this medication? Your condition will be monitored carefully while you are receiving this medication. You may need blood work while taking this medication. This medication may make you feel generally unwell. This is not uncommon as chemotherapy can affect healthy cells as well as cancer cells. Report any side effects. Continue your course of treatment even though you feel ill unless your care team tells you to stop. This medication can cause serious allergic reactions. To reduce the risk, your care team may give you other medications to take before receiving this one. Be sure to follow the directions from your care team. This medication may increase your risk of getting an infection. Call your care team for advice if you get a fever, chills, sore throat, or  other symptoms of a cold or flu. Do not treat yourself. Try to avoid being around people who are sick. This medication may increase your risk to bruise or bleed. Call your care team if you notice any unusual bleeding. Be careful brushing or flossing your teeth or using a toothpick because you may get an infection or bleed more easily. If you have any dental work done, tell your dentist you are receiving this medication. Talk to your care team if you may be pregnant. Serious birth defects can occur if you take this medication during pregnancy. Talk to your care team before breastfeeding. Changes to your treatment plan may be needed. What side effects may I notice from receiving this medication? Side effects that you should report to your care team as soon as possible: Allergic reactions--skin rash, itching, hives, swelling of the face, lips, tongue, or throat Heart rhythm changes--fast or irregular heartbeat, dizziness, feeling faint or lightheaded, chest pain, trouble breathing Increase in blood pressure Infection--fever, chills, cough, sore throat, wounds that don't heal, pain or trouble when passing urine, general feeling of discomfort or being unwell Low blood pressure--dizziness, feeling faint or lightheaded, blurry vision Low red blood cell level--unusual weakness or fatigue, dizziness, headache, trouble breathing Painful swelling, warmth, or redness of the skin, blisters  or sores at the infusion site Pain, tingling, or numbness in the hands or feet Slow heartbeat--dizziness, feeling faint or lightheaded, confusion, trouble breathing, unusual weakness or fatigue Unusual bruising or bleeding Side effects that usually do not require medical attention (report to your care team if they continue or are bothersome): Diarrhea Hair loss Joint pain Loss of appetite Muscle pain Nausea Vomiting This list may not describe all possible side effects. Call your doctor for medical advice about side  effects. You may report side effects to FDA at 1-800-FDA-1088. Where should I keep my medication? This medication is given in a hospital or clinic. It will not be stored at home. NOTE: This sheet is a summary. It may not cover all possible information. If you have questions about this medicine, talk to your doctor, pharmacist, or health care provider.  2024 Elsevier/Gold Standard (2021-06-30 00:00:00) ____________________________________________________ Carboplatin  Injection What is this medication? CARBOPLATIN  (KAR boe pla tin) treats some types of cancer. It works by slowing down the growth of cancer cells. This medicine may be used for other purposes; ask your health care provider or pharmacist if you have questions. COMMON BRAND NAME(S): Paraplatin  What should I tell my care team before I take this medication? They need to know if you have any of these conditions: Blood disorders Hearing problems Kidney disease Recent or ongoing radiation therapy An unusual or allergic reaction to carboplatin , cisplatin, other medications, foods, dyes, or preservatives Pregnant or trying to get pregnant Breast-feeding How should I use this medication? This medication is injected into a vein. It is given by your care team in a hospital or clinic setting. Talk to your care team about the use of this medication in children. Special care may be needed. Overdosage: If you think you have taken too much of this medicine contact a poison control center or emergency room at once. NOTE: This medicine is only for you. Do not share this medicine with others. What if I miss a dose? Keep appointments for follow-up doses. It is important not to miss your dose. Call your care team if you are unable to keep an appointment. What may interact with this medication? Medications for seizures Some antibiotics, such as amikacin, gentamicin, neomycin, streptomycin, tobramycin Vaccines This list may not describe all  possible interactions. Give your health care provider a list of all the medicines, herbs, non-prescription drugs, or dietary supplements you use. Also tell them if you smoke, drink alcohol, or use illegal drugs. Some items may interact with your medicine. What should I watch for while using this medication? Your condition will be monitored carefully while you are receiving this medication. You may need blood work while taking this medication. This medication may make you feel generally unwell. This is not uncommon, as chemotherapy can affect healthy cells as well as cancer cells. Report any side effects. Continue your course of treatment even though you feel ill unless your care team tells you to stop. In some cases, you may be given additional medications to help with side effects. Follow all directions for their use. This medication may increase your risk of getting an infection. Call your care team for advice if you get a fever, chills, sore throat, or other symptoms of a cold or flu. Do not treat yourself. Try to avoid being around people who are sick. Avoid taking medications that contain aspirin, acetaminophen , ibuprofen, naproxen, or ketoprofen unless instructed by your care team. These medications may hide a fever. Be careful brushing or flossing your  teeth or using a toothpick because you may get an infection or bleed more easily. If you have any dental work done, tell your dentist you are receiving this medication. Talk to your care team if you wish to become pregnant or think you might be pregnant. This medication can cause serious birth defects. Talk to your care team about effective forms of contraception. Do not breast-feed while taking this medication. What side effects may I notice from receiving this medication? Side effects that you should report to your care team as soon as possible: Allergic reactions--skin rash, itching, hives, swelling of the face, lips, tongue, or  throat Infection--fever, chills, cough, sore throat, wounds that don't heal, pain or trouble when passing urine, general feeling of discomfort or being unwell Low red blood cell level--unusual weakness or fatigue, dizziness, headache, trouble breathing Pain, tingling, or numbness in the hands or feet, muscle weakness, change in vision, confusion or trouble speaking, loss of balance or coordination, trouble walking, seizures Unusual bruising or bleeding Side effects that usually do not require medical attention (report to your care team if they continue or are bothersome): Hair loss Nausea Unusual weakness or fatigue Vomiting This list may not describe all possible side effects. Call your doctor for medical advice about side effects. You may report side effects to FDA at 1-800-FDA-1088. Where should I keep my medication? This medication is given in a hospital or clinic. It will not be stored at home. NOTE: This sheet is a summary. It may not cover all possible information. If you have questions about this medicine, talk to your doctor, pharmacist, or health care provider.  2024 Elsevier/Gold Standard (2021-06-02 00:00:00)

## 2023-11-18 ENCOUNTER — Other Ambulatory Visit: Payer: Self-pay

## 2023-11-18 ENCOUNTER — Telehealth: Payer: Self-pay

## 2023-11-18 LAB — CA 125: Cancer Antigen (CA) 125: 51.4 U/mL — ABNORMAL HIGH (ref 0.0–38.1)

## 2023-11-18 NOTE — Telephone Encounter (Signed)
 Patient gave verbal understanding and had no further questions.

## 2023-11-18 NOTE — Telephone Encounter (Signed)
-----   Message from Olam Ned sent at 11/18/2023  8:16 AM EDT ----- Please let her know the CA125 is lower.

## 2023-11-20 ENCOUNTER — Other Ambulatory Visit: Payer: Self-pay

## 2023-11-24 ENCOUNTER — Other Ambulatory Visit (HOSPITAL_BASED_OUTPATIENT_CLINIC_OR_DEPARTMENT_OTHER): Payer: Self-pay

## 2023-11-24 MED ORDER — COMIRNATY 30 MCG/0.3ML IM SUSY
0.3000 mL | PREFILLED_SYRINGE | Freq: Once | INTRAMUSCULAR | 0 refills | Status: AC
  Filled 2023-11-24: qty 0.3, 1d supply, fill #0

## 2023-11-26 ENCOUNTER — Encounter: Payer: Self-pay | Admitting: Oncology

## 2023-11-26 ENCOUNTER — Other Ambulatory Visit (HOSPITAL_BASED_OUTPATIENT_CLINIC_OR_DEPARTMENT_OTHER): Payer: Self-pay

## 2023-11-30 ENCOUNTER — Other Ambulatory Visit (HOSPITAL_BASED_OUTPATIENT_CLINIC_OR_DEPARTMENT_OTHER): Payer: Self-pay

## 2023-11-30 MED ORDER — FLUZONE HIGH-DOSE 0.5 ML IM SUSY
0.5000 mL | PREFILLED_SYRINGE | Freq: Once | INTRAMUSCULAR | 0 refills | Status: AC
  Filled 2023-11-30: qty 0.5, 1d supply, fill #0

## 2023-12-01 DIAGNOSIS — Z6826 Body mass index (BMI) 26.0-26.9, adult: Secondary | ICD-10-CM | POA: Diagnosis not present

## 2023-12-01 DIAGNOSIS — H6090 Unspecified otitis externa, unspecified ear: Secondary | ICD-10-CM | POA: Diagnosis not present

## 2023-12-03 ENCOUNTER — Other Ambulatory Visit (HOSPITAL_BASED_OUTPATIENT_CLINIC_OR_DEPARTMENT_OTHER): Payer: Self-pay

## 2023-12-03 MED FILL — Mirtazapine Tab 7.5 MG: ORAL | 30 days supply | Qty: 30 | Fill #0 | Status: AC

## 2023-12-04 ENCOUNTER — Other Ambulatory Visit: Payer: Self-pay | Admitting: Oncology

## 2023-12-07 ENCOUNTER — Inpatient Hospital Stay (HOSPITAL_BASED_OUTPATIENT_CLINIC_OR_DEPARTMENT_OTHER): Admitting: Nurse Practitioner

## 2023-12-07 ENCOUNTER — Inpatient Hospital Stay: Attending: Oncology

## 2023-12-07 ENCOUNTER — Inpatient Hospital Stay

## 2023-12-07 ENCOUNTER — Encounter: Payer: Self-pay | Admitting: Nurse Practitioner

## 2023-12-07 VITALS — BP 157/77 | HR 105 | Temp 98.0°F | Resp 18 | Ht 59.0 in | Wt 125.7 lb

## 2023-12-07 DIAGNOSIS — C481 Malignant neoplasm of specified parts of peritoneum: Secondary | ICD-10-CM | POA: Diagnosis not present

## 2023-12-07 DIAGNOSIS — Z5111 Encounter for antineoplastic chemotherapy: Secondary | ICD-10-CM | POA: Diagnosis present

## 2023-12-07 DIAGNOSIS — Z5189 Encounter for other specified aftercare: Secondary | ICD-10-CM | POA: Insufficient documentation

## 2023-12-07 LAB — CBC WITH DIFFERENTIAL (CANCER CENTER ONLY)
Abs Immature Granulocytes: 0.02 K/uL (ref 0.00–0.07)
Basophils Absolute: 0.1 K/uL (ref 0.0–0.1)
Basophils Relative: 1 %
Eosinophils Absolute: 0.1 K/uL (ref 0.0–0.5)
Eosinophils Relative: 2 %
HCT: 36 % (ref 36.0–46.0)
Hemoglobin: 11.7 g/dL — ABNORMAL LOW (ref 12.0–15.0)
Immature Granulocytes: 0 %
Lymphocytes Relative: 25 %
Lymphs Abs: 1.3 K/uL (ref 0.7–4.0)
MCH: 36 pg — ABNORMAL HIGH (ref 26.0–34.0)
MCHC: 32.5 g/dL (ref 30.0–36.0)
MCV: 110.8 fL — ABNORMAL HIGH (ref 80.0–100.0)
Monocytes Absolute: 0.6 K/uL (ref 0.1–1.0)
Monocytes Relative: 11 %
Neutro Abs: 3.3 K/uL (ref 1.7–7.7)
Neutrophils Relative %: 61 %
Platelet Count: 243 K/uL (ref 150–400)
RBC: 3.25 MIL/uL — ABNORMAL LOW (ref 3.87–5.11)
RDW: 14.1 % (ref 11.5–15.5)
WBC Count: 5.3 K/uL (ref 4.0–10.5)
nRBC: 0 % (ref 0.0–0.2)

## 2023-12-07 LAB — CMP (CANCER CENTER ONLY)
ALT: 14 U/L (ref 0–44)
AST: 30 U/L (ref 15–41)
Albumin: 3.9 g/dL (ref 3.5–5.0)
Alkaline Phosphatase: 84 U/L (ref 38–126)
Anion gap: 10 (ref 5–15)
BUN: 14 mg/dL (ref 8–23)
CO2: 27 mmol/L (ref 22–32)
Calcium: 9.5 mg/dL (ref 8.9–10.3)
Chloride: 104 mmol/L (ref 98–111)
Creatinine: 0.72 mg/dL (ref 0.44–1.00)
GFR, Estimated: >60 mL/min (ref >60)
Glucose, Bld: 176 mg/dL — ABNORMAL HIGH (ref 70–99)
Potassium: 3.8 mmol/L (ref 3.5–5.1)
Sodium: 141 mmol/L (ref 135–145)
Total Bilirubin: 0.3 mg/dL (ref 0.0–1.2)
Total Protein: 7 g/dL (ref 6.5–8.1)

## 2023-12-07 NOTE — Patient Instructions (Signed)

## 2023-12-07 NOTE — Progress Notes (Signed)
 [facility] OFFICE PROGRESS NOTE   Diagnosis: Peritoneal cancer  INTERVAL HISTORY:   Nancy Harrison returns as scheduled.  She completed cycle 4 Taxol /carboplatin  11/17/2023.  She had mild nausea around day 2 or 3.  The nausea lasted a few hours.  No vomiting.  She thinks she may have had a single mouth sore, now resolved.  Stools continue to be loose but not watery.  She takes Imodium  and Lomotil  as needed.  No numbness or tingling in the hands or feet.  Overall good appetite.  Objective:  Vital signs in last 24 hours:  Blood pressure (!) 157/77, pulse (!) 105, temperature 98 F (36.7 C), resp. rate 18, height 4' 11 (1.499 m), weight 125 lb 11.2 oz (57 kg), SpO2 98%.    HEENT: No thrush or ulcers. Resp: Lungs clear bilaterally. Cardio: Regular rate and rhythm. GI: No hepatosplenomegaly.  Right abdomen ileostomy with partially formed brown stool in the collection bag. Vascular: No leg edema. Skin: No rash. Port-A-Cath without erythema.  Lab Results:  Lab Results  Component Value Date   WBC 5.3 12/07/2023   HGB 11.7 (L) 12/07/2023   HCT 36.0 12/07/2023   MCV 110.8 (H) 12/07/2023   PLT 243 12/07/2023   NEUTROABS 3.3 12/07/2023    Imaging:  No results found.  Medications: I have reviewed the patient's current medications.  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  Bone lesions concerning for metastases, right lung nodule Laparoscopy, diverting loop ileostomy, gastrostomy tube placement, peritoneal biopsy 10/16/2021, no evidence of a primary tumor site at the appendix, ovaries, or uterus.  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  CDX2, CK20, and GATA3 negative.  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 Foundation 1-MSS, tumor mutation burden 5, HRD positive-LOH score 34.1% 08/11/2021 CA125 818 CT chest 11/03/2021-10 mm right lower lobe nodule, scattered tiny pulmonary nodules, peritoneal carcinomatosis Cycle 1 Taxol /carboplatin  11/12/2021 Cycle 2 Taxol /carboplatin  12/03/2021 Cycle 3 Taxol /carboplatin  12/24/2021 Bone scan 01/05/2022-negative for metastatic disease Cycle 4 Taxol /carboplatin  01/13/2022 CTs 01/22/2022-reduction in peritoneal carcinomatosis, no evidence of progressive disease stable bilateral lower lobe pulmonary nodules Cycle 5 Taxol /carboplatin  02/03/2022 Cycle 6 Taxol /carboplatin  02/24/2022 CT abdomen/pelvis 04/19/2022-slight decrease in peritoneal carcinomatosis with residual lesions 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 Olaparib  05/14/2022 Bevacizumab  every 3 weeks 05/19/2022 Olaparib  held on hospital admission 05/29/2022 Olaparib  resumed 06/09/2022, dose reduced to 150 mg twice daily CT abdomen/pelvis 09/06/2022-decrease in size and conspicuity of peritoneal carcinomatosis, unchanged left adrenal nodule Olaparib /bevacizumab  continued CT abdomen/pelvis 03/16/2023: No progressive disease, stable ill-defined soft tissue in the gastrocolic ligament, stable right lower lobe nodule Olaparib /bevacizumab  continued CT abdomen/pelvis 08/27/2023: New right hydroureteronephrosis to the level of the pelvic inlet/13 mm enhancing nodule, soft tissue density at the gastrocolic ligament measures smaller in a previously identified nodule adjacent to this is not identified, left adrenal nodule is stable, stable right lower lobe nodule Olaparib /bevacizumab  discontinued Cycle 1 Taxol /carboplatin  09/16/2023, Fulphila  Cycle 2  Taxol /carboplatin  10/06/2023, Fulphila  Cycle 3 Taxol /carboplatin  10/27/2023, Fulphila  Cycle 4 Taxol /carboplatin  11/17/2023, Fulphila  Cycle 5 Taxol /carboplatin  12/08/2023, Fulphila  2.   Abdominal distention/pain and diarrhea secondary to #1 3.   Right breast cancer June 1999,  stage Ia (T1CN0), ER negative, PR negative, HER2 negative.  Lumpectomy and adjuvant CMF x8 followed by radiation 4. Hypertension 5. Hypothyroidism 6. Barrett's esophagus 7. Family history of multiple cancers including appendix, colon, and lung cancer 8.  Hospital admission 11/05/2021 with dehydration/prerenal azotemia secondary to nausea and high output ileostomy 9.  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 Admission 05/29/2022 with dehydration and acute renal injury  Disposition: Nancy Harrison appears stable.  She has completed 4 cycles of Taxol /carboplatin .  She continues to tolerate treatment well.  There is no clinical evidence of disease progression.  Most recent CA125 was lower.  Plan to proceed with cycle 5 as scheduled 12/08/2023.  CBC and chemistry panel reviewed.  Labs adequate for treatment.  She will return for follow-up and cycle 6 Taxol /carboplatin  in 3 weeks.  We are available to see her sooner if needed.    Olam Ned ANP/GNP-BC   12/07/2023  3:24 PM

## 2023-12-08 ENCOUNTER — Ambulatory Visit: Admitting: Nurse Practitioner

## 2023-12-08 ENCOUNTER — Inpatient Hospital Stay

## 2023-12-08 ENCOUNTER — Other Ambulatory Visit

## 2023-12-08 ENCOUNTER — Ambulatory Visit

## 2023-12-08 VITALS — BP 160/85 | HR 103 | Temp 98.0°F | Resp 18

## 2023-12-08 DIAGNOSIS — C481 Malignant neoplasm of specified parts of peritoneum: Secondary | ICD-10-CM

## 2023-12-08 DIAGNOSIS — Z5111 Encounter for antineoplastic chemotherapy: Secondary | ICD-10-CM | POA: Diagnosis not present

## 2023-12-08 MED ORDER — SODIUM CHLORIDE 0.9 % IV SOLN: INTRAVENOUS | Status: DC

## 2023-12-08 MED ORDER — FAMOTIDINE IN NACL 20-0.9 MG/50ML-% IV SOLN
20.0000 mg | Freq: Once | INTRAVENOUS | Status: AC
  Administered 2023-12-08: 20 mg via INTRAVENOUS
  Filled 2023-12-08: qty 50

## 2023-12-08 MED ORDER — SODIUM CHLORIDE 0.9 % IV SOLN
318.0000 mg | Freq: Once | INTRAVENOUS | Status: AC
  Administered 2023-12-08: 320 mg via INTRAVENOUS
  Filled 2023-12-08: qty 32

## 2023-12-08 MED ORDER — SODIUM CHLORIDE 0.9 % IV SOLN
150.0000 mg | Freq: Once | INTRAVENOUS | Status: AC
  Administered 2023-12-08: 150 mg via INTRAVENOUS
  Filled 2023-12-08: qty 150

## 2023-12-08 MED ORDER — DEXAMETHASONE SOD PHOSPHATE PF 10 MG/ML IJ SOLN
10.0000 mg | Freq: Once | INTRAMUSCULAR | Status: AC
  Administered 2023-12-08: 10 mg via INTRAVENOUS

## 2023-12-08 MED ORDER — DIPHENHYDRAMINE HCL 50 MG/ML IJ SOLN
25.0000 mg | Freq: Once | INTRAMUSCULAR | Status: AC
  Administered 2023-12-08: 25 mg via INTRAVENOUS
  Filled 2023-12-08: qty 1

## 2023-12-08 MED ORDER — PALONOSETRON HCL INJECTION 0.25 MG/5ML
0.2500 mg | Freq: Once | INTRAVENOUS | Status: AC
  Administered 2023-12-08: 0.25 mg via INTRAVENOUS
  Filled 2023-12-08: qty 5

## 2023-12-08 MED ORDER — SODIUM CHLORIDE 0.9 % IV SOLN
175.0000 mg/m2 | Freq: Once | INTRAVENOUS | Status: AC
  Administered 2023-12-08: 264 mg via INTRAVENOUS
  Filled 2023-12-08: qty 44

## 2023-12-08 NOTE — Patient Instructions (Signed)
 [facility] - A DEPT OF [facility]  Discharge Instructions: Thank you for choosing [facility] to provide your oncology and hematology care.   If you have a lab appointment with the Cancer Center, please go directly to the Cancer Center and check in at the registration area.   Wear comfortable clothing and clothing appropriate for easy access to any Portacath or PICC line.   We strive to give you quality time with your provider. You may need to reschedule your appointment if you arrive late (15 or more minutes).  Arriving late affects you and other patients whose appointments are after yours.  Also, if you miss three or more appointments without notifying the office, you may be dismissed from the clinic at the provider's discretion.      For prescription refill requests, have your pharmacy contact our office and allow 72 hours for refills to be completed.    Today you received the following chemotherapy and/or immunotherapy agents: Taxol  and carboplatin .     To help prevent nausea and vomiting after your treatment, we encourage you to take your nausea medication as directed.  BELOW ARE SYMPTOMS THAT SHOULD BE REPORTED IMMEDIATELY: *FEVER GREATER THAN 100.4 F (38 C) OR HIGHER *CHILLS OR SWEATING *NAUSEA AND VOMITING THAT IS NOT CONTROLLED WITH YOUR NAUSEA MEDICATION *UNUSUAL SHORTNESS OF BREATH *UNUSUAL BRUISING OR BLEEDING *URINARY PROBLEMS (pain or burning when urinating, or frequent urination) *BOWEL PROBLEMS (unusual diarrhea, constipation, pain near the anus) TENDERNESS IN MOUTH AND THROAT WITH OR WITHOUT PRESENCE OF ULCERS (sore throat, sores in mouth, or a toothache) UNUSUAL RASH, SWELLING OR PAIN  UNUSUAL VAGINAL DISCHARGE OR ITCHING   Items with * indicate a potential emergency and should be followed up as soon as possible or go to the Emergency Department if any problems should occur.  Please show the CHEMOTHERAPY ALERT CARD or  IMMUNOTHERAPY ALERT CARD at check-in to the Emergency Department and triage nurse.  Should you have questions after your visit or need to cancel or reschedule your appointment, please contact [facility] - A DEPT OF [facility]  Dept: [phone]  and follow the prompts.  Office hours are 8:00 a.m. to 4:30 p.m. Monday - Friday. Please note that voicemails left after 4:00 p.m. may not be returned until the following business day.  We are closed weekends and major holidays. You have access to a nurse at all times for urgent questions. Please call the main number to the clinic Dept: [phone] and follow the prompts.   For any non-urgent questions, you may also contact your provider using MyChart. We now offer e-Visits for anyone 58 and older to request care online for non-urgent symptoms. For details visit [email or web address].   Also download the MyChart app! Go to the app store, search MyChart, open the app, select [place], and log in with your MyChart username and password. ________________________________________________________ Paclitaxel  Injection What is this medication? PACLITAXEL  (PAK li TAX el) treats some types of cancer. It works by slowing down the growth of cancer cells. This medicine may be used for other purposes; ask your health care provider or pharmacist if you have questions. COMMON BRAND NAME(S): Onxol, Taxol  What should I tell my care team before I take this medication? They need to know if you have any of these conditions: Heart disease Liver disease Low white blood cell levels An unusual or allergic reaction to paclitaxel , other medications, foods, dyes, or preservatives  If you or your partner are pregnant or trying to get pregnant Breast-feeding How should I use this medication? This medication is injected into a vein. It is given by your care team in a hospital or clinic setting. Talk to your care team about the use of this  medication in children. While it may be given to children for selected conditions, precautions do apply. Overdosage: If you think you have taken too much of this medicine contact a poison control center or emergency room at once. NOTE: This medicine is only for you. Do not share this medicine with others. What if I miss a dose? Keep appointments for follow-up doses. It is important not to miss your dose. Call your care team if you are unable to keep an appointment. What may interact with this medication? Do not take this medication with any of the following: Live virus vaccines Other medications may affect the way this medication works. Talk with your care team about all of the medications you take. They may suggest changes to your treatment plan to lower the risk of side effects and to make sure your medications work as intended. This list may not describe all possible interactions. Give your health care provider a list of all the medicines, herbs, non-prescription drugs, or dietary supplements you use. Also tell them if you smoke, drink alcohol, or use illegal drugs. Some items may interact with your medicine. What should I watch for while using this medication? Your condition will be monitored carefully while you are receiving this medication. You may need blood work while taking this medication. This medication may make you feel generally unwell. This is not uncommon as chemotherapy can affect healthy cells as well as cancer cells. Report any side effects. Continue your course of treatment even though you feel ill unless your care team tells you to stop. This medication can cause serious allergic reactions. To reduce the risk, your care team may give you other medications to take before receiving this one. Be sure to follow the directions from your care team. This medication may increase your risk of getting an infection. Call your care team for advice if you get a fever, chills, sore throat, or  other symptoms of a cold or flu. Do not treat yourself. Try to avoid being around people who are sick. This medication may increase your risk to bruise or bleed. Call your care team if you notice any unusual bleeding. Be careful brushing or flossing your teeth or using a toothpick because you may get an infection or bleed more easily. If you have any dental work done, tell your dentist you are receiving this medication. Talk to your care team if you may be pregnant. Serious birth defects can occur if you take this medication during pregnancy. Talk to your care team before breastfeeding. Changes to your treatment plan may be needed. What side effects may I notice from receiving this medication? Side effects that you should report to your care team as soon as possible: Allergic reactions--skin rash, itching, hives, swelling of the face, lips, tongue, or throat Heart rhythm changes--fast or irregular heartbeat, dizziness, feeling faint or lightheaded, chest pain, trouble breathing Increase in blood pressure Infection--fever, chills, cough, sore throat, wounds that don't heal, pain or trouble when passing urine, general feeling of discomfort or being unwell Low blood pressure--dizziness, feeling faint or lightheaded, blurry vision Low red blood cell level--unusual weakness or fatigue, dizziness, headache, trouble breathing Painful swelling, warmth, or redness of the skin, blisters  or sores at the infusion site Pain, tingling, or numbness in the hands or feet Slow heartbeat--dizziness, feeling faint or lightheaded, confusion, trouble breathing, unusual weakness or fatigue Unusual bruising or bleeding Side effects that usually do not require medical attention (report to your care team if they continue or are bothersome): Diarrhea Hair loss Joint pain Loss of appetite Muscle pain Nausea Vomiting This list may not describe all possible side effects. Call your doctor for medical advice about side  effects. You may report side effects to FDA at 1-800-FDA-1088. Where should I keep my medication? This medication is given in a hospital or clinic. It will not be stored at home. NOTE: This sheet is a summary. It may not cover all possible information. If you have questions about this medicine, talk to your doctor, pharmacist, or health care provider.  2024 Elsevier/Gold Standard (2021-06-30 00:00:00) ____________________________________________________ Carboplatin  Injection What is this medication? CARBOPLATIN  (KAR boe pla tin) treats some types of cancer. It works by slowing down the growth of cancer cells. This medicine may be used for other purposes; ask your health care provider or pharmacist if you have questions. COMMON BRAND NAME(S): Paraplatin  What should I tell my care team before I take this medication? They need to know if you have any of these conditions: Blood disorders Hearing problems Kidney disease Recent or ongoing radiation therapy An unusual or allergic reaction to carboplatin , cisplatin, other medications, foods, dyes, or preservatives Pregnant or trying to get pregnant Breast-feeding How should I use this medication? This medication is injected into a vein. It is given by your care team in a hospital or clinic setting. Talk to your care team about the use of this medication in children. Special care may be needed. Overdosage: If you think you have taken too much of this medicine contact a poison control center or emergency room at once. NOTE: This medicine is only for you. Do not share this medicine with others. What if I miss a dose? Keep appointments for follow-up doses. It is important not to miss your dose. Call your care team if you are unable to keep an appointment. What may interact with this medication? Medications for seizures Some antibiotics, such as amikacin, gentamicin, neomycin, streptomycin, tobramycin Vaccines This list may not describe all  possible interactions. Give your health care provider a list of all the medicines, herbs, non-prescription drugs, or dietary supplements you use. Also tell them if you smoke, drink alcohol, or use illegal drugs. Some items may interact with your medicine. What should I watch for while using this medication? Your condition will be monitored carefully while you are receiving this medication. You may need blood work while taking this medication. This medication may make you feel generally unwell. This is not uncommon, as chemotherapy can affect healthy cells as well as cancer cells. Report any side effects. Continue your course of treatment even though you feel ill unless your care team tells you to stop. In some cases, you may be given additional medications to help with side effects. Follow all directions for their use. This medication may increase your risk of getting an infection. Call your care team for advice if you get a fever, chills, sore throat, or other symptoms of a cold or flu. Do not treat yourself. Try to avoid being around people who are sick. Avoid taking medications that contain aspirin, acetaminophen , ibuprofen, naproxen, or ketoprofen unless instructed by your care team. These medications may hide a fever. Be careful brushing or flossing your  teeth or using a toothpick because you may get an infection or bleed more easily. If you have any dental work done, tell your dentist you are receiving this medication. Talk to your care team if you wish to become pregnant or think you might be pregnant. This medication can cause serious birth defects. Talk to your care team about effective forms of contraception. Do not breast-feed while taking this medication. What side effects may I notice from receiving this medication? Side effects that you should report to your care team as soon as possible: Allergic reactions--skin rash, itching, hives, swelling of the face, lips, tongue, or  throat Infection--fever, chills, cough, sore throat, wounds that don't heal, pain or trouble when passing urine, general feeling of discomfort or being unwell Low red blood cell level--unusual weakness or fatigue, dizziness, headache, trouble breathing Pain, tingling, or numbness in the hands or feet, muscle weakness, change in vision, confusion or trouble speaking, loss of balance or coordination, trouble walking, seizures Unusual bruising or bleeding Side effects that usually do not require medical attention (report to your care team if they continue or are bothersome): Hair loss Nausea Unusual weakness or fatigue Vomiting This list may not describe all possible side effects. Call your doctor for medical advice about side effects. You may report side effects to FDA at 1-800-FDA-1088. Where should I keep my medication? This medication is given in a hospital or clinic. It will not be stored at home. NOTE: This sheet is a summary. It may not cover all possible information. If you have questions about this medicine, talk to your doctor, pharmacist, or health care provider.  2024 Elsevier/Gold Standard (2021-06-02 00:00:00)

## 2023-12-08 NOTE — Progress Notes (Signed)
 Printed out pt's appointments to remind her of the Fulphila  injection on 12/10/23 at [facility] (reminded to enter at the hospital main entrance).

## 2023-12-09 DIAGNOSIS — N139 Obstructive and reflux uropathy, unspecified: Secondary | ICD-10-CM | POA: Diagnosis not present

## 2023-12-09 DIAGNOSIS — R3914 Feeling of incomplete bladder emptying: Secondary | ICD-10-CM | POA: Diagnosis not present

## 2023-12-09 DIAGNOSIS — N1330 Unspecified hydronephrosis: Secondary | ICD-10-CM | POA: Diagnosis not present

## 2023-12-10 ENCOUNTER — Inpatient Hospital Stay

## 2023-12-10 VITALS — BP 162/79 | HR 102 | Temp 98.2°F | Resp 18

## 2023-12-10 DIAGNOSIS — C481 Malignant neoplasm of specified parts of peritoneum: Secondary | ICD-10-CM

## 2023-12-10 DIAGNOSIS — Z5111 Encounter for antineoplastic chemotherapy: Secondary | ICD-10-CM | POA: Diagnosis not present

## 2023-12-10 LAB — CA 125: Cancer Antigen (CA) 125: 47.1 U/mL — ABNORMAL HIGH (ref 0.0–38.1)

## 2023-12-10 MED ORDER — PEGFILGRASTIM-JMDB 6 MG/0.6ML ~~LOC~~ SOSY
6.0000 mg | PREFILLED_SYRINGE | Freq: Once | SUBCUTANEOUS | Status: AC
  Administered 2023-12-10: 6 mg via SUBCUTANEOUS

## 2023-12-11 ENCOUNTER — Other Ambulatory Visit: Payer: Self-pay

## 2023-12-12 ENCOUNTER — Telehealth: Payer: Self-pay

## 2023-12-12 NOTE — Telephone Encounter (Signed)
 Patient gave verbal understanding and had no further questions.

## 2023-12-12 NOTE — Telephone Encounter (Signed)
-----   Message from Olam Ned sent at 12/12/2023  8:04 AM EDT ----- Please let her know CA125 tumor marker is lower.

## 2023-12-16 DIAGNOSIS — Z932 Ileostomy status: Secondary | ICD-10-CM | POA: Diagnosis not present

## 2023-12-23 DIAGNOSIS — Z932 Ileostomy status: Secondary | ICD-10-CM | POA: Diagnosis not present

## 2023-12-24 ENCOUNTER — Other Ambulatory Visit: Payer: Self-pay | Admitting: Oncology

## 2023-12-24 ENCOUNTER — Other Ambulatory Visit (HOSPITAL_BASED_OUTPATIENT_CLINIC_OR_DEPARTMENT_OTHER): Payer: Self-pay

## 2023-12-26 ENCOUNTER — Encounter: Payer: Self-pay | Admitting: Radiology

## 2023-12-26 ENCOUNTER — Other Ambulatory Visit: Payer: Self-pay | Admitting: *Deleted

## 2023-12-26 ENCOUNTER — Encounter: Payer: Self-pay | Admitting: Oncology

## 2023-12-26 ENCOUNTER — Other Ambulatory Visit: Payer: Self-pay

## 2023-12-26 ENCOUNTER — Other Ambulatory Visit (HOSPITAL_BASED_OUTPATIENT_CLINIC_OR_DEPARTMENT_OTHER): Payer: Self-pay

## 2023-12-26 MED FILL — Magnesium Oxide Tab 400 MG: ORAL | 30 days supply | Qty: 60 | Fill #0 | Status: AC

## 2023-12-27 ENCOUNTER — Inpatient Hospital Stay: Attending: Oncology | Admitting: Oncology

## 2023-12-27 ENCOUNTER — Inpatient Hospital Stay

## 2023-12-27 VITALS — BP 141/92 | HR 100 | Temp 97.8°F | Resp 18 | Ht 59.0 in | Wt 125.6 lb

## 2023-12-27 DIAGNOSIS — Z5111 Encounter for antineoplastic chemotherapy: Secondary | ICD-10-CM | POA: Diagnosis present

## 2023-12-27 DIAGNOSIS — C481 Malignant neoplasm of specified parts of peritoneum: Secondary | ICD-10-CM

## 2023-12-27 DIAGNOSIS — C786 Secondary malignant neoplasm of retroperitoneum and peritoneum: Secondary | ICD-10-CM | POA: Insufficient documentation

## 2023-12-27 DIAGNOSIS — Z5189 Encounter for other specified aftercare: Secondary | ICD-10-CM | POA: Diagnosis not present

## 2023-12-27 LAB — CBC WITH DIFFERENTIAL (CANCER CENTER ONLY)
Abs Immature Granulocytes: 0.03 K/uL (ref 0.00–0.07)
Basophils Absolute: 0.1 K/uL (ref 0.0–0.1)
Basophils Relative: 1 %
Eosinophils Absolute: 0.1 K/uL (ref 0.0–0.5)
Eosinophils Relative: 1 %
HCT: 35.2 % — ABNORMAL LOW (ref 36.0–46.0)
Hemoglobin: 11.6 g/dL — ABNORMAL LOW (ref 12.0–15.0)
Immature Granulocytes: 0 %
Lymphocytes Relative: 15 %
Lymphs Abs: 1.1 K/uL (ref 0.7–4.0)
MCH: 36.7 pg — ABNORMAL HIGH (ref 26.0–34.0)
MCHC: 33 g/dL (ref 30.0–36.0)
MCV: 111.4 fL — ABNORMAL HIGH (ref 80.0–100.0)
Monocytes Absolute: 1 K/uL (ref 0.1–1.0)
Monocytes Relative: 13 %
Neutro Abs: 5.2 K/uL (ref 1.7–7.7)
Neutrophils Relative %: 70 %
Platelet Count: 300 K/uL (ref 150–400)
RBC: 3.16 MIL/uL — ABNORMAL LOW (ref 3.87–5.11)
RDW: 15.3 % (ref 11.5–15.5)
WBC Count: 7.4 K/uL (ref 4.0–10.5)
nRBC: 0 % (ref 0.0–0.2)

## 2023-12-27 LAB — CMP (CANCER CENTER ONLY)
ALT: 11 U/L (ref 0–44)
AST: 25 U/L (ref 15–41)
Albumin: 4.2 g/dL (ref 3.5–5.0)
Alkaline Phosphatase: 90 U/L (ref 38–126)
Anion gap: 9 (ref 5–15)
BUN: 18 mg/dL (ref 8–23)
CO2: 27 mmol/L (ref 22–32)
Calcium: 9.9 mg/dL (ref 8.9–10.3)
Chloride: 103 mmol/L (ref 98–111)
Creatinine: 0.6 mg/dL (ref 0.44–1.00)
GFR, Estimated: >60 mL/min (ref >60)
Glucose, Bld: 130 mg/dL — ABNORMAL HIGH (ref 70–99)
Potassium: 4.2 mmol/L (ref 3.5–5.1)
Sodium: 139 mmol/L (ref 135–145)
Total Bilirubin: 0.4 mg/dL (ref 0.0–1.2)
Total Protein: 7.3 g/dL (ref 6.5–8.1)

## 2023-12-27 LAB — MAGNESIUM: Magnesium: 2.2 mg/dL (ref 1.7–2.4)

## 2023-12-27 NOTE — Patient Instructions (Signed)

## 2023-12-27 NOTE — Progress Notes (Signed)
 [facility] OFFICE PROGRESS NOTE   Diagnosis: Peritoneal carcinoma  INTERVAL HISTORY:   Nancy Harrison completed another cycle of paclitaxel /carboplatin  12/08/2023.  No symptom of allergic reaction.  She reports mild peripheral numbness.  This has not progressed.  No other complaint.  Objective:  Vital signs in last 24 hours:  Blood pressure (!) 141/92, pulse 100, temperature 97.8 F (36.6 C), resp. rate 18, height 4' 11 (1.499 m), weight 125 lb 9.6 oz (57 kg), SpO2 98%.    HEENT: No thrush or ulcers Resp: Lungs clear bilaterally Cardio: Regular rhythm, tachycardia GI: Right abdomen ileostomy, no mass, no apparent ascites Vascular: Trace left greater than right ankle edema  Portacath/PICC-without erythema  Lab Results:  Lab Results  Component Value Date   WBC 7.4 12/27/2023   HGB 11.6 (L) 12/27/2023   HCT 35.2 (L) 12/27/2023   MCV 111.4 (H) 12/27/2023   PLT 300 12/27/2023   NEUTROABS 5.2 12/27/2023    CMP  Lab Results  Component Value Date   NA 141 12/07/2023   K 3.8 12/07/2023   CL 104 12/07/2023   CO2 27 12/07/2023   GLUCOSE 176 (H) 12/07/2023   BUN 14 12/07/2023   CREATININE 0.72 12/07/2023   CALCIUM  9.5 12/07/2023   PROT 7.0 12/07/2023   ALBUMIN  3.9 12/07/2023   AST 30 12/07/2023   ALT 14 12/07/2023   ALKPHOS 84 12/07/2023   BILITOT 0.3 12/07/2023   GFRNONAA >60 12/07/2023    Lab Results  Component Value Date   CEA1 2.5 10/18/2021    Lab Results  Component Value Date   INR 1.0 05/29/2022   LABPROT 13.0 05/29/2022    Imaging:  No results found.  Medications: I have reviewed the patient's current medications.   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  Peritoneal implants and omental caking consistent  with carcinomatosis.  Bone lesions concerning for metastases, right lung nodule Laparoscopy, diverting loop ileostomy, gastrostomy tube placement, peritoneal biopsy 10/16/2021, no evidence of a primary tumor site at the appendix, ovaries, or uterus.  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  CDX2, CK20, and GATA3 negative.  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 Foundation 1-MSS, tumor mutation burden 5, HRD positive-LOH score 34.1% 08/11/2021 CA125 818 CT chest 11/03/2021-10 mm right lower lobe nodule, scattered tiny pulmonary nodules, peritoneal carcinomatosis Cycle 1 Taxol /carboplatin  11/12/2021 Cycle 2 Taxol /carboplatin  12/03/2021 Cycle 3 Taxol /carboplatin  12/24/2021 Bone scan 01/05/2022-negative for metastatic disease Cycle 4 Taxol /carboplatin  01/13/2022 CTs 01/22/2022-reduction in peritoneal carcinomatosis, no evidence of progressive disease stable bilateral lower lobe pulmonary nodules Cycle 5 Taxol /carboplatin  02/03/2022 Cycle 6 Taxol /carboplatin  02/24/2022 CT abdomen/pelvis 04/19/2022-slight decrease in peritoneal carcinomatosis with residual lesions 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 Olaparib  05/14/2022 Bevacizumab  every 3 weeks 05/19/2022 Olaparib  held on hospital admission 05/29/2022 Olaparib  resumed 06/09/2022, dose reduced to 150 mg twice daily CT abdomen/pelvis 09/06/2022-decrease in size and conspicuity of peritoneal carcinomatosis, unchanged left adrenal nodule Olaparib /bevacizumab  continued CT abdomen/pelvis 03/16/2023: No progressive disease, stable ill-defined soft tissue in the gastrocolic ligament, stable right lower lobe nodule Olaparib /bevacizumab  continued CT abdomen/pelvis 08/27/2023: New right hydroureteronephrosis to the level of  the pelvic inlet/13 mm enhancing nodule, soft tissue density at the gastrocolic ligament measures  smaller in a previously identified nodule adjacent to this is not identified, left adrenal nodule is stable, stable right lower lobe nodule Olaparib /bevacizumab  discontinued Cycle 1 Taxol /carboplatin  09/16/2023, Fulphila  Cycle 2 Taxol /carboplatin  10/06/2023, Fulphila  Cycle 3 Taxol /carboplatin  10/27/2023, Fulphila  Cycle 4 Taxol /carboplatin  11/17/2023, Fulphila  Cycle 5 Taxol /carboplatin  12/08/2023, Fulphila  Cycle 6 Taxol /carboplatin  12/28/2023, for [place] 2.   Abdominal distention/pain and diarrhea secondary to #1 3.   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  Lumpectomy and adjuvant CMF x8 followed by radiation 4. Hypertension 5. Hypothyroidism 6. Barrett's esophagus 7. Family history of multiple cancers including appendix, colon, and lung cancer 8.  Hospital admission 11/05/2021 with dehydration/prerenal azotemia secondary to nausea and high output ileostomy 9.  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 Admission 05/29/2022 with dehydration and acute renal injury    Disposition: Sure what appears stable.  She has completed 5 cycles of salvage chemotherapy with Taxol /carboplatin .  She has tolerated the chemotherapy well.  She will complete cycle 6 tomorrow.  The CA125 was lower when she was here 3 weeks ago.  She will undergo restaging CTs prior to an office visit on 01/25/2024.  Arley Hof, MD  12/27/2023  12:04 PM

## 2023-12-28 ENCOUNTER — Inpatient Hospital Stay

## 2023-12-28 VITALS — BP 160/93 | HR 103 | Temp 98.2°F | Resp 18

## 2023-12-28 DIAGNOSIS — C481 Malignant neoplasm of specified parts of peritoneum: Secondary | ICD-10-CM

## 2023-12-28 DIAGNOSIS — Z5111 Encounter for antineoplastic chemotherapy: Secondary | ICD-10-CM | POA: Diagnosis not present

## 2023-12-28 MED ORDER — SODIUM CHLORIDE 0.9 % IV SOLN: INTRAVENOUS | Status: DC

## 2023-12-28 MED ORDER — PALONOSETRON HCL INJECTION 0.25 MG/5ML
0.2500 mg | Freq: Once | INTRAVENOUS | Status: AC
  Administered 2023-12-28: 0.25 mg via INTRAVENOUS
  Filled 2023-12-28: qty 5

## 2023-12-28 MED ORDER — SODIUM CHLORIDE 0.9 % IV SOLN
175.0000 mg/m2 | Freq: Once | INTRAVENOUS | Status: AC
  Administered 2023-12-28: 264 mg via INTRAVENOUS
  Filled 2023-12-28: qty 44

## 2023-12-28 MED ORDER — FAMOTIDINE IN NACL 20-0.9 MG/50ML-% IV SOLN
20.0000 mg | Freq: Once | INTRAVENOUS | Status: AC
  Administered 2023-12-28: 20 mg via INTRAVENOUS
  Filled 2023-12-28: qty 50

## 2023-12-28 MED ORDER — SODIUM CHLORIDE 0.9 % IV SOLN
150.0000 mg | Freq: Once | INTRAVENOUS | Status: AC
  Administered 2023-12-28: 150 mg via INTRAVENOUS
  Filled 2023-12-28: qty 150

## 2023-12-28 MED ORDER — DIPHENHYDRAMINE HCL 50 MG/ML IJ SOLN
25.0000 mg | Freq: Once | INTRAMUSCULAR | Status: AC
  Administered 2023-12-28: 25 mg via INTRAVENOUS
  Filled 2023-12-28: qty 1

## 2023-12-28 MED ORDER — SODIUM CHLORIDE 0.9 % IV SOLN
318.0000 mg | Freq: Once | INTRAVENOUS | Status: AC
  Administered 2023-12-28: 320 mg via INTRAVENOUS
  Filled 2023-12-28: qty 32

## 2023-12-28 MED ORDER — DEXAMETHASONE SOD PHOSPHATE PF 10 MG/ML IJ SOLN
10.0000 mg | Freq: Once | INTRAMUSCULAR | Status: AC
  Administered 2023-12-28: 10 mg via INTRAVENOUS

## 2023-12-28 NOTE — Patient Instructions (Signed)
 [facility] - A DEPT OF [facility]  Discharge Instructions: Thank you for choosing [facility] to provide your oncology and hematology care.   If you have a lab appointment with the Cancer Center, please go directly to the Cancer Center and check in at the registration area.   Wear comfortable clothing and clothing appropriate for easy access to any Portacath or PICC line.   We strive to give you quality time with your provider. You may need to reschedule your appointment if you arrive late (15 or more minutes).  Arriving late affects you and other patients whose appointments are after yours.  Also, if you miss three or more appointments without notifying the office, you may be dismissed from the clinic at the provider's discretion.      For prescription refill requests, have your pharmacy contact our office and allow 72 hours for refills to be completed.    Today you received the following chemotherapy and/or immunotherapy agents: taxol  and carboplatin .      To help prevent nausea and vomiting after your treatment, we encourage you to take your nausea medication as directed.  BELOW ARE SYMPTOMS THAT SHOULD BE REPORTED IMMEDIATELY: *FEVER GREATER THAN 100.4 F (38 C) OR HIGHER *CHILLS OR SWEATING *NAUSEA AND VOMITING THAT IS NOT CONTROLLED WITH YOUR NAUSEA MEDICATION *UNUSUAL SHORTNESS OF BREATH *UNUSUAL BRUISING OR BLEEDING *URINARY PROBLEMS (pain or burning when urinating, or frequent urination) *BOWEL PROBLEMS (unusual diarrhea, constipation, pain near the anus) TENDERNESS IN MOUTH AND THROAT WITH OR WITHOUT PRESENCE OF ULCERS (sore throat, sores in mouth, or a toothache) UNUSUAL RASH, SWELLING OR PAIN  UNUSUAL VAGINAL DISCHARGE OR ITCHING   Items with * indicate a potential emergency and should be followed up as soon as possible or go to the Emergency Department if any problems should occur.  Please show the CHEMOTHERAPY ALERT CARD or  IMMUNOTHERAPY ALERT CARD at check-in to the Emergency Department and triage nurse.  Should you have questions after your visit or need to cancel or reschedule your appointment, please contact [facility] - A DEPT OF [facility]  Dept: [phone]  and follow the prompts.  Office hours are 8:00 a.m. to 4:30 p.m. Monday - Friday. Please note that voicemails left after 4:00 p.m. may not be returned until the following business day.  We are closed weekends and major holidays. You have access to a nurse at all times for urgent questions. Please call the main number to the clinic Dept: [phone] and follow the prompts.   For any non-urgent questions, you may also contact your provider using MyChart. We now offer e-Visits for anyone 56 and older to request care online for non-urgent symptoms. For details visit [email or web address].   Also download the MyChart app! Go to the app store, search MyChart, open the app, select [facility], and log in with your MyChart username and password.

## 2023-12-29 ENCOUNTER — Telehealth: Payer: Self-pay | Admitting: Oncology

## 2023-12-29 NOTE — Telephone Encounter (Signed)
 PT called to get an earlier appt time for 11/7. Time confirmed.

## 2023-12-30 ENCOUNTER — Inpatient Hospital Stay

## 2023-12-30 VITALS — BP 145/71 | HR 109 | Temp 98.2°F | Resp 18

## 2023-12-30 DIAGNOSIS — C481 Malignant neoplasm of specified parts of peritoneum: Secondary | ICD-10-CM

## 2023-12-30 DIAGNOSIS — Z5111 Encounter for antineoplastic chemotherapy: Secondary | ICD-10-CM | POA: Diagnosis not present

## 2023-12-30 MED ORDER — PEGFILGRASTIM-JMDB 6 MG/0.6ML ~~LOC~~ SOSY
6.0000 mg | PREFILLED_SYRINGE | Freq: Once | SUBCUTANEOUS | Status: AC
  Administered 2023-12-30: 6 mg via SUBCUTANEOUS
  Filled 2023-12-30: qty 0.6

## 2023-12-30 NOTE — Patient Instructions (Signed)

## 2023-12-31 ENCOUNTER — Other Ambulatory Visit: Payer: Self-pay | Admitting: Oncology

## 2024-01-02 ENCOUNTER — Other Ambulatory Visit: Payer: Self-pay

## 2024-01-02 ENCOUNTER — Other Ambulatory Visit (HOSPITAL_BASED_OUTPATIENT_CLINIC_OR_DEPARTMENT_OTHER): Payer: Self-pay

## 2024-01-02 MED ORDER — MIRTAZAPINE 7.5 MG PO TABS
7.5000 mg | ORAL_TABLET | Freq: Every day | ORAL | 5 refills | Status: AC
  Filled 2024-01-02: qty 30, 30d supply, fill #0
  Filled 2024-02-08: qty 30, 30d supply, fill #1
  Filled 2024-03-01 – 2024-03-02 (×2): qty 30, 30d supply, fill #2

## 2024-01-02 MED ORDER — DIPHENOXYLATE-ATROPINE 2.5-0.025 MG PO TABS
1.0000 | ORAL_TABLET | Freq: Two times a day (BID) | ORAL | 2 refills | Status: AC | PRN
  Filled 2024-01-02: qty 60, 30d supply, fill #0
  Filled 2024-03-01: qty 60, 30d supply, fill #1

## 2024-01-11 ENCOUNTER — Telehealth: Payer: Self-pay | Admitting: *Deleted

## 2024-01-11 NOTE — Telephone Encounter (Signed)
 Notified patient of CT on 12/01-arrive at 3 pm for 5 pm scan (oral contrast) and come to [facility] at 1430 for port access. She agrees.

## 2024-01-13 ENCOUNTER — Other Ambulatory Visit (HOSPITAL_BASED_OUTPATIENT_CLINIC_OR_DEPARTMENT_OTHER): Payer: Self-pay

## 2024-01-17 ENCOUNTER — Other Ambulatory Visit (HOSPITAL_BASED_OUTPATIENT_CLINIC_OR_DEPARTMENT_OTHER): Payer: Self-pay

## 2024-01-17 MED ORDER — AMLODIPINE BESYLATE 2.5 MG PO TABS
2.5000 mg | ORAL_TABLET | Freq: Every day | ORAL | 1 refills | Status: AC
  Filled 2024-01-17: qty 90, 90d supply, fill #0

## 2024-01-18 ENCOUNTER — Encounter (HOSPITAL_BASED_OUTPATIENT_CLINIC_OR_DEPARTMENT_OTHER): Payer: Self-pay

## 2024-01-18 DIAGNOSIS — Z932 Ileostomy status: Secondary | ICD-10-CM | POA: Diagnosis not present

## 2024-01-21 ENCOUNTER — Other Ambulatory Visit: Payer: Self-pay | Admitting: Oncology

## 2024-01-23 ENCOUNTER — Ambulatory Visit (HOSPITAL_BASED_OUTPATIENT_CLINIC_OR_DEPARTMENT_OTHER)
Admission: RE | Admit: 2024-01-23 | Discharge: 2024-01-23 | Disposition: A | Source: Ambulatory Visit | Attending: Oncology | Admitting: Oncology

## 2024-01-23 ENCOUNTER — Inpatient Hospital Stay

## 2024-01-23 ENCOUNTER — Inpatient Hospital Stay: Attending: Oncology

## 2024-01-23 DIAGNOSIS — Z452 Encounter for adjustment and management of vascular access device: Secondary | ICD-10-CM | POA: Insufficient documentation

## 2024-01-23 DIAGNOSIS — Z5111 Encounter for antineoplastic chemotherapy: Secondary | ICD-10-CM | POA: Diagnosis present

## 2024-01-23 DIAGNOSIS — C481 Malignant neoplasm of specified parts of peritoneum: Secondary | ICD-10-CM | POA: Diagnosis present

## 2024-01-23 DIAGNOSIS — C482 Malignant neoplasm of peritoneum, unspecified: Secondary | ICD-10-CM | POA: Diagnosis not present

## 2024-01-23 DIAGNOSIS — Z5189 Encounter for other specified aftercare: Secondary | ICD-10-CM | POA: Insufficient documentation

## 2024-01-23 DIAGNOSIS — N134 Hydroureter: Secondary | ICD-10-CM | POA: Diagnosis not present

## 2024-01-23 DIAGNOSIS — N1330 Unspecified hydronephrosis: Secondary | ICD-10-CM | POA: Diagnosis not present

## 2024-01-23 DIAGNOSIS — C786 Secondary malignant neoplasm of retroperitoneum and peritoneum: Secondary | ICD-10-CM | POA: Diagnosis present

## 2024-01-23 MED ORDER — IOHEXOL 300 MG/ML  SOLN
100.0000 mL | Freq: Once | INTRAMUSCULAR | Status: AC | PRN
  Administered 2024-01-23: 100 mL via INTRAVENOUS

## 2024-01-23 MED ORDER — HEPARIN SOD (PORK) LOCK FLUSH 100 UNIT/ML IV SOLN
500.0000 [IU] | Freq: Once | INTRAVENOUS | Status: AC
  Administered 2024-01-23: 500 [IU] via INTRAVENOUS

## 2024-01-23 NOTE — Patient Instructions (Signed)

## 2024-01-25 ENCOUNTER — Inpatient Hospital Stay

## 2024-01-25 ENCOUNTER — Encounter: Payer: Self-pay | Admitting: Nurse Practitioner

## 2024-01-25 ENCOUNTER — Inpatient Hospital Stay: Admitting: Nurse Practitioner

## 2024-01-25 ENCOUNTER — Inpatient Hospital Stay: Attending: Oncology

## 2024-01-25 VITALS — BP 138/83 | HR 105 | Temp 97.8°F | Resp 18 | Ht 59.0 in | Wt 125.4 lb

## 2024-01-25 DIAGNOSIS — C481 Malignant neoplasm of specified parts of peritoneum: Secondary | ICD-10-CM

## 2024-01-25 DIAGNOSIS — Z5111 Encounter for antineoplastic chemotherapy: Secondary | ICD-10-CM | POA: Diagnosis not present

## 2024-01-25 LAB — CMP (CANCER CENTER ONLY)
ALT: 13 U/L (ref 0–44)
AST: 24 U/L (ref 15–41)
Albumin: 4.1 g/dL (ref 3.5–5.0)
Alkaline Phosphatase: 82 U/L (ref 38–126)
Anion gap: 8 (ref 5–15)
BUN: 16 mg/dL (ref 8–23)
CO2: 29 mmol/L (ref 22–32)
Calcium: 10 mg/dL (ref 8.9–10.3)
Chloride: 102 mmol/L (ref 98–111)
Creatinine: 0.67 mg/dL (ref 0.44–1.00)
GFR, Estimated: >60 mL/min (ref >60)
Glucose, Bld: 136 mg/dL — ABNORMAL HIGH (ref 70–99)
Potassium: 4.4 mmol/L (ref 3.5–5.1)
Sodium: 139 mmol/L (ref 135–145)
Total Bilirubin: 0.5 mg/dL (ref 0.0–1.2)
Total Protein: 7.3 g/dL (ref 6.5–8.1)

## 2024-01-25 LAB — CBC WITH DIFFERENTIAL (CANCER CENTER ONLY)
Abs Immature Granulocytes: 0.03 K/uL (ref 0.00–0.07)
Basophils Absolute: 0.1 K/uL (ref 0.0–0.1)
Basophils Relative: 1 %
Eosinophils Absolute: 0.2 K/uL (ref 0.0–0.5)
Eosinophils Relative: 4 %
HCT: 36.6 % (ref 36.0–46.0)
Hemoglobin: 11.9 g/dL — ABNORMAL LOW (ref 12.0–15.0)
Immature Granulocytes: 1 %
Lymphocytes Relative: 17 %
Lymphs Abs: 1 K/uL (ref 0.7–4.0)
MCH: 36.4 pg — ABNORMAL HIGH (ref 26.0–34.0)
MCHC: 32.5 g/dL (ref 30.0–36.0)
MCV: 111.9 fL — ABNORMAL HIGH (ref 80.0–100.0)
Monocytes Absolute: 0.8 K/uL (ref 0.1–1.0)
Monocytes Relative: 13 %
Neutro Abs: 3.9 K/uL (ref 1.7–7.7)
Neutrophils Relative %: 64 %
Platelet Count: 227 K/uL (ref 150–400)
RBC: 3.27 MIL/uL — ABNORMAL LOW (ref 3.87–5.11)
RDW: 15.3 % (ref 11.5–15.5)
WBC Count: 6 K/uL (ref 4.0–10.5)
nRBC: 0 % (ref 0.0–0.2)

## 2024-01-25 NOTE — Progress Notes (Signed)
 [facility] OFFICE PROGRESS NOTE   Diagnosis: Peritoneal carcinoma  INTERVAL HISTORY:   Nancy Harrison returns as scheduled.  She completed cycle 6 Taxol /carboplatin  12/28/2023.  She denies nausea/vomiting.  No mouth sores.  No diarrhea except after drinking CT contrast.  Stable mild numbness in the fingertips and toes.  Does not interfere with activity.  No rash.  Objective:  Vital signs in last 24 hours:  Blood pressure 138/83, pulse (!) 105, temperature 97.8 F (36.6 C), temperature source Temporal, resp. rate 18, height 4' 11 (1.499 m), weight 125 lb 6.4 oz (56.9 kg), SpO2 98%.    HEENT: No thrush or ulcers. Resp: Lungs clear bilaterally. Cardio: Regular rate and rhythm. GI: No hepatosplenomegaly.  Right abdomen ileostomy. Vascular: Trace left ankle edema. Skin: No rash. Port-A-Cath without erythema.  Lab Results:  Lab Results  Component Value Date   WBC 6.0 01/25/2024   HGB 11.9 (L) 01/25/2024   HCT 36.6 01/25/2024   MCV 111.9 (H) 01/25/2024   PLT 227 01/25/2024   NEUTROABS 3.9 01/25/2024    Imaging:  CT ABDOMEN PELVIS W CONTRAST Result Date: 01/24/2024 EXAM: CT ABDOMEN AND PELVIS WITH CONTRAST 01/23/2024 04:57:22 PM TECHNIQUE: CT of the abdomen and pelvis was performed with the administration of 100 mL of iohexol  (OMNIPAQUE ) 300 MG/ML solution. Multiplanar reformatted images are provided for review. Automated exposure control, iterative reconstruction, and/or weight-based adjustment of the mA/kV was utilized to reduce the radiation dose to as low as reasonably achievable. COMPARISON: 08/27/2023 CLINICAL HISTORY: Restaging of extraovarian primary peritoneal carcinoma, ongoing chemotherapy. * Tracking Code: BO * FINDINGS: LOWER CHEST: Mild scarring in both lower lobes. LIVER: Stable calcified capsular lesions along the liver anteriorly on image 10 series 2. GALLBLADDER AND BILE DUCTS: Gallbladder is unremarkable. No biliary ductal dilatation. SPLEEN:  Punctate calcifications in the spleen favoring old granulomatous disease. PANCREAS: No acute abnormality. ADRENAL GLANDS: Stable faint nodularity along the left adrenal gland. KIDNEYS, URETERS AND BLADDER: Prominent right hydronephrosis and proximal hydroureter extending down to the iliac vessel cross over in the vicinity of ill defined nodularity along the right adnexa/ovary, tumor in the vicinity as a cause for ureteral obstruction is a distinct possibility. Mildly delayed excretion from the right kidney. 4 mm left kidney upper pole nonobstructive renal calculus. No stones in the right kidney or ureter. No perinephric or periureteral stranding. Urinary bladder is unremarkable. GI AND BOWEL: Lower quadrant loop enterostomy. Sigmoid colon diverticulosis. Soft tissue nodularity along the left sigmoid colon adjacent to the adnexa on image 51 series 2, tumor deposit in this vicinity not excluded. Abnormal soft tissue density in the transverse mesocolon on image 35 series 2 suspicious for tumor, increased conspicuity compared to the prior exam although some of this may be positional. Suspected prior omentectomy. PERITONEUM AND RETROPERITONEUM: No ascites. No free air. VASCULATURE: Aorta is normal in caliber. Substantial atheromatous plaque dorsally at the origin of the celiac trunk. Prominent right eccentric plaque at the origin and proximally in the superior mesenteric artery causing significant proximal stenosis in the superior mesenteric artery. LYMPH NODES: No lymphadenopathy. REPRODUCTIVE ORGANS: Ill defined nodularity along the right adnexa/ovary. BONES AND SOFT TISSUES: Dextroconvex thoracolumbar scoliosis with substantial lumbar spondylosis and degenerative disc disease. Grade 1 degenerative retrolisthesis at T12-L1, L1-L2, and L2-L3 with grade 1 anterolisthesis at L4-L5. S1 has slightly transitional characteristics. Foraminal impingement on the right at L3-L4 and L4-L5 and on the left at T12-L1 and L1-L2. Mild  sclerosis along the right sacroiliac joint suggesting mildly asymmetric right  sacroiliitis. IMPRESSION: 1. Abnormal soft tissue density in the transverse mesocolon suspicious for tumor, with increased conspicuity compared to the prior exam, although some of this may be positional. 2. Soft tissue nodularity along the left sigmoid colon adjacent to the adnexa, tumor deposit in this vicinity not excluded. 3. Prominent right hydronephrosis and proximal hydroureter, possibly due to tumor in the vicinity of the right adnexa/ovary, with mildly delayed excretion from the right kidney. 4. Proximal stenosis in the superior mesenteric artery due to prominent right eccentric plaque. Aortic atherosclerosis. 5. Dextroconvex thoracolumbar scoliosis with substantial lumbar spondylosis and degenerative disc disease, including grade 1 degenerative retrolisthesis at T12-L1, L1-2, and L2-3, and grade 1 anterolisthesis at L4-5. 6. Foraminal impingement on the right at L3-4 and L4-5 and on the left at T12-L1 and L1-2. Electronically signed by: Ryan Salvage MD 01/24/2024 11:24 AM EST RP Workstation: [id number]    Medications: I have reviewed the patient's current medications.  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  Bone lesions concerning for metastases, right lung nodule Laparoscopy, diverting loop ileostomy, gastrostomy tube placement, peritoneal biopsy 10/16/2021, no evidence of a primary tumor site at the appendix, ovaries, or uterus.  Diffuse peritoneal carcinomatosis Pathology of the lesser curvature of stomach carcinomatosis biopsy-metastatic poorly differentiated carcinoma, CK7, PAX8, WT1, and p53 positive with focal  labeling for p16.  CDX2, CK20, and GATA3 negative.  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 Foundation 1-MSS, tumor mutation burden 5, HRD positive-LOH score 34.1% 08/11/2021 CA125 818 CT chest 11/03/2021-10 mm right lower lobe nodule, scattered tiny pulmonary nodules, peritoneal carcinomatosis Cycle 1 Taxol /carboplatin  11/12/2021 Cycle 2 Taxol /carboplatin  12/03/2021 Cycle 3 Taxol /carboplatin  12/24/2021 Bone scan 01/05/2022-negative for metastatic disease Cycle 4 Taxol /carboplatin  01/13/2022 CTs 01/22/2022-reduction in peritoneal carcinomatosis, no evidence of progressive disease stable bilateral lower lobe pulmonary nodules Cycle 5 Taxol /carboplatin  02/03/2022 Cycle 6 Taxol /carboplatin  02/24/2022 CT abdomen/pelvis 04/19/2022-slight decrease in peritoneal carcinomatosis with residual lesions 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 Olaparib  05/14/2022 Bevacizumab  every 3 weeks 05/19/2022 Olaparib  held on hospital admission 05/29/2022 Olaparib  resumed 06/09/2022, dose reduced to 150 mg twice daily CT abdomen/pelvis 09/06/2022-decrease in size and conspicuity of peritoneal carcinomatosis, unchanged left adrenal nodule Olaparib /bevacizumab  continued CT abdomen/pelvis 03/16/2023: No progressive disease, stable ill-defined soft tissue in the gastrocolic ligament, stable right lower lobe nodule Olaparib /bevacizumab  continued CT abdomen/pelvis 08/27/2023: New right hydroureteronephrosis to the level of the pelvic inlet/13 mm enhancing nodule, soft tissue density at the gastrocolic ligament measures smaller in a previously identified nodule adjacent to this is not identified, left adrenal nodule is stable, stable right lower lobe nodule Olaparib /bevacizumab  discontinued Cycle 1 Taxol /carboplatin  09/16/2023, Fulphila  Cycle 2 Taxol /carboplatin  10/06/2023, Fulphila  Cycle 3 Taxol /carboplatin  10/27/2023, Fulphila  Cycle 4 Taxol /carboplatin   11/17/2023, Fulphila  Cycle 5 Taxol /carboplatin  12/08/2023, Fulphila  Cycle 6 Taxol /carboplatin  12/28/2023, Fulphila  CTs 01/23/2024-abnormal soft tissue density in the transverse mesocolon, more conspicuous; soft tissue nodularity along the left sigmoid colon adjacent to the adnexa, tumor deposit not excluded; prominent right hydronephrosis and proximal hydroureter Cycle 7 Taxol /carboplatin  01/26/2024, Fulphila  2.   Abdominal distention/pain and diarrhea secondary to #1 3.   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  Lumpectomy and adjuvant CMF x8 followed by radiation 4. Hypertension 5. Hypothyroidism 6. Barrett's esophagus 7. Family history of multiple  cancers including appendix, colon, and lung cancer 8.  Hospital admission 11/05/2021 with dehydration/prerenal azotemia secondary to nausea and high output ileostomy 9.  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 Admission 05/29/2022 with dehydration and acute renal injury    Disposition: Nancy Harrison appears stable.  She has completed 6 cycles of Taxol /carboplatin  chemotherapy.  She is tolerating treatment well.  There is no clinical evidence of disease progression.  We reviewed the recent CT report/images with her and her husband at today's visit.  Somewhat difficult to compare due to lack of oral contrast with the comparison scan.  Overall appears stable.  She agrees to continue with the current treatment, cycle 7 tomorrow.  We will follow-up on the CA125 from today.  CBC and chemistry panel reviewed.  Labs are adequate for treatment.  She will return for follow-up and the next cycle in 3 weeks.  We are available to see her sooner if needed.  Patient seen with Dr. Cloretta.    Nancy Harrison ANP/GNP-BC   01/25/2024  10:57 AM This was a shared visit with Nancy Harrison.  We reviewed the restaging CT findings and images with Nancy Harrison.  The CTs are consistent  with stable disease.  The CA125 has been lower over the past few months.  Will follow-up on the CA125 today.  We discussed continuing the current treatment regimen, a treatment break, and changing to single agent carboplatin .  She does not appear to have significant neuropathy symptoms.  We decided to continue paclitaxel /carboplatin .  Arvella Cloretta, MD

## 2024-01-25 NOTE — Progress Notes (Signed)
 Patient seen by Olam Ned NP today  Vitals are within treatment parameters:Yes   Labs are within treatment parameters: Yes   Treatment plan has been signed: Yes   Per physician team, Patient is ready for treatment and there are NO modifications to the treatment plan.Chemo on  01/26/24

## 2024-01-26 ENCOUNTER — Inpatient Hospital Stay

## 2024-01-26 VITALS — BP 160/84 | HR 94 | Temp 98.1°F | Resp 16

## 2024-01-26 DIAGNOSIS — Z5111 Encounter for antineoplastic chemotherapy: Secondary | ICD-10-CM | POA: Diagnosis not present

## 2024-01-26 DIAGNOSIS — C481 Malignant neoplasm of specified parts of peritoneum: Secondary | ICD-10-CM

## 2024-01-26 LAB — CA 125: Cancer Antigen (CA) 125: 50.7 U/mL — ABNORMAL HIGH (ref 0.0–38.1)

## 2024-01-26 MED ORDER — PALONOSETRON HCL INJECTION 0.25 MG/5ML
0.2500 mg | Freq: Once | INTRAVENOUS | Status: AC
  Administered 2024-01-26: 0.25 mg via INTRAVENOUS
  Filled 2024-01-26: qty 5

## 2024-01-26 MED ORDER — SODIUM CHLORIDE 0.9 % IV SOLN
318.0000 mg | Freq: Once | INTRAVENOUS | Status: AC
  Administered 2024-01-26: 320 mg via INTRAVENOUS
  Filled 2024-01-26: qty 32

## 2024-01-26 MED ORDER — SODIUM CHLORIDE 0.9 % IV SOLN
175.0000 mg/m2 | Freq: Once | INTRAVENOUS | Status: AC
  Administered 2024-01-26: 264 mg via INTRAVENOUS
  Filled 2024-01-26: qty 44

## 2024-01-26 MED ORDER — DEXAMETHASONE SOD PHOSPHATE PF 10 MG/ML IJ SOLN
10.0000 mg | Freq: Once | INTRAMUSCULAR | Status: AC
  Administered 2024-01-26: 10 mg via INTRAVENOUS

## 2024-01-26 MED ORDER — SODIUM CHLORIDE 0.9 % IV SOLN: INTRAVENOUS | Status: DC

## 2024-01-26 MED ORDER — SODIUM CHLORIDE 0.9 % IV SOLN
150.0000 mg | Freq: Once | INTRAVENOUS | Status: AC
  Administered 2024-01-26: 150 mg via INTRAVENOUS
  Filled 2024-01-26: qty 150

## 2024-01-26 MED ORDER — DIPHENHYDRAMINE HCL 50 MG/ML IJ SOLN
25.0000 mg | Freq: Once | INTRAMUSCULAR | Status: AC
  Administered 2024-01-26: 25 mg via INTRAVENOUS
  Filled 2024-01-26: qty 1

## 2024-01-26 MED ORDER — FAMOTIDINE IN NACL 20-0.9 MG/50ML-% IV SOLN
20.0000 mg | Freq: Once | INTRAVENOUS | Status: AC
  Administered 2024-01-26: 20 mg via INTRAVENOUS
  Filled 2024-01-26: qty 50

## 2024-01-26 NOTE — Patient Instructions (Signed)
 [facility] - A DEPT OF [facility]  Discharge Instructions: Thank you for choosing [facility] to provide your oncology and hematology care.   If you have a lab appointment with the Cancer Center, please go directly to the Cancer Center and check in at the registration area.   Wear comfortable clothing and clothing appropriate for easy access to any Portacath or PICC line.   We strive to give you quality time with your provider. You may need to reschedule your appointment if you arrive late (15 or more minutes).  Arriving late affects you and other patients whose appointments are after yours.  Also, if you miss three or more appointments without notifying the office, you may be dismissed from the clinic at the provider's discretion.      For prescription refill requests, have your pharmacy contact our office and allow 72 hours for refills to be completed.    Today you received the following chemotherapy and/or immunotherapy agents: taxol  and carboplatin .      To help prevent nausea and vomiting after your treatment, we encourage you to take your nausea medication as directed.  BELOW ARE SYMPTOMS THAT SHOULD BE REPORTED IMMEDIATELY: *FEVER GREATER THAN 100.4 F (38 C) OR HIGHER *CHILLS OR SWEATING *NAUSEA AND VOMITING THAT IS NOT CONTROLLED WITH YOUR NAUSEA MEDICATION *UNUSUAL SHORTNESS OF BREATH *UNUSUAL BRUISING OR BLEEDING *URINARY PROBLEMS (pain or burning when urinating, or frequent urination) *BOWEL PROBLEMS (unusual diarrhea, constipation, pain near the anus) TENDERNESS IN MOUTH AND THROAT WITH OR WITHOUT PRESENCE OF ULCERS (sore throat, sores in mouth, or a toothache) UNUSUAL RASH, SWELLING OR PAIN  UNUSUAL VAGINAL DISCHARGE OR ITCHING   Items with * indicate a potential emergency and should be followed up as soon as possible or go to the Emergency Department if any problems should occur.  Please show the CHEMOTHERAPY ALERT CARD or  IMMUNOTHERAPY ALERT CARD at check-in to the Emergency Department and triage nurse.  Should you have questions after your visit or need to cancel or reschedule your appointment, please contact [facility] - A DEPT OF [facility]  Dept: [phone]  and follow the prompts.  Office hours are 8:00 a.m. to 4:30 p.m. Monday - Friday. Please note that voicemails left after 4:00 p.m. may not be returned until the following business day.  We are closed weekends and major holidays. You have access to a nurse at all times for urgent questions. Please call the main number to the clinic Dept: [phone] and follow the prompts.   For any non-urgent questions, you may also contact your provider using MyChart. We now offer e-Visits for anyone 56 and older to request care online for non-urgent symptoms. For details visit [email or web address].   Also download the MyChart app! Go to the app store, search MyChart, open the app, select [facility], and log in with your MyChart username and password.

## 2024-01-27 ENCOUNTER — Other Ambulatory Visit: Payer: Self-pay

## 2024-01-28 ENCOUNTER — Inpatient Hospital Stay

## 2024-01-28 VITALS — BP 150/69 | HR 113 | Temp 97.3°F | Resp 18

## 2024-01-28 DIAGNOSIS — Z5111 Encounter for antineoplastic chemotherapy: Secondary | ICD-10-CM | POA: Diagnosis not present

## 2024-01-28 DIAGNOSIS — C481 Malignant neoplasm of specified parts of peritoneum: Secondary | ICD-10-CM

## 2024-01-28 MED ORDER — PEGFILGRASTIM-JMDB 6 MG/0.6ML ~~LOC~~ SOSY
6.0000 mg | PREFILLED_SYRINGE | Freq: Once | SUBCUTANEOUS | Status: AC
  Administered 2024-01-28: 6 mg via SUBCUTANEOUS
  Filled 2024-01-28: qty 0.6

## 2024-02-01 ENCOUNTER — Encounter: Payer: Self-pay | Admitting: Oncology

## 2024-02-01 ENCOUNTER — Other Ambulatory Visit: Payer: Self-pay | Admitting: Nurse Practitioner

## 2024-02-01 ENCOUNTER — Other Ambulatory Visit: Payer: Self-pay | Admitting: Oncology

## 2024-02-01 ENCOUNTER — Other Ambulatory Visit (HOSPITAL_BASED_OUTPATIENT_CLINIC_OR_DEPARTMENT_OTHER): Payer: Self-pay

## 2024-02-01 DIAGNOSIS — C481 Malignant neoplasm of specified parts of peritoneum: Secondary | ICD-10-CM

## 2024-02-01 MED ORDER — POTASSIUM CHLORIDE ER 10 MEQ PO CPCR
10.0000 meq | ORAL_CAPSULE | Freq: Two times a day (BID) | ORAL | 3 refills | Status: AC
  Filled 2024-02-01: qty 60, 30d supply, fill #0

## 2024-02-01 MED ORDER — MAGNESIUM OXIDE 400 MG PO TABS
400.0000 mg | ORAL_TABLET | Freq: Two times a day (BID) | ORAL | 0 refills | Status: DC
  Filled 2024-02-01: qty 60, 30d supply, fill #0

## 2024-02-02 ENCOUNTER — Other Ambulatory Visit (HOSPITAL_BASED_OUTPATIENT_CLINIC_OR_DEPARTMENT_OTHER): Payer: Self-pay

## 2024-02-06 ENCOUNTER — Telehealth: Payer: Self-pay | Admitting: Oncology

## 2024-02-06 NOTE — Telephone Encounter (Signed)
 PT called to reschedule appt time for 12/22, new time confirmed with PT

## 2024-02-07 ENCOUNTER — Other Ambulatory Visit: Payer: Self-pay

## 2024-02-08 ENCOUNTER — Other Ambulatory Visit (HOSPITAL_BASED_OUTPATIENT_CLINIC_OR_DEPARTMENT_OTHER): Payer: Self-pay

## 2024-02-12 ENCOUNTER — Other Ambulatory Visit: Payer: Self-pay | Admitting: Oncology

## 2024-02-12 DIAGNOSIS — Z932 Ileostomy status: Secondary | ICD-10-CM | POA: Diagnosis not present

## 2024-02-13 ENCOUNTER — Inpatient Hospital Stay: Admitting: Oncology

## 2024-02-13 ENCOUNTER — Inpatient Hospital Stay

## 2024-02-13 VITALS — BP 158/73 | HR 111 | Temp 98.2°F | Resp 18

## 2024-02-13 VITALS — BP 136/85 | HR 100 | Temp 97.7°F | Resp 18 | Ht 59.0 in | Wt 125.6 lb

## 2024-02-13 DIAGNOSIS — C481 Malignant neoplasm of specified parts of peritoneum: Secondary | ICD-10-CM

## 2024-02-13 DIAGNOSIS — Z5111 Encounter for antineoplastic chemotherapy: Secondary | ICD-10-CM | POA: Diagnosis not present

## 2024-02-13 LAB — CBC WITH DIFFERENTIAL (CANCER CENTER ONLY)
Abs Immature Granulocytes: 0.03 K/uL (ref 0.00–0.07)
Basophils Absolute: 0.1 K/uL (ref 0.0–0.1)
Basophils Relative: 1 %
Eosinophils Absolute: 0.1 K/uL (ref 0.0–0.5)
Eosinophils Relative: 2 %
HCT: 33.8 % — ABNORMAL LOW (ref 36.0–46.0)
Hemoglobin: 11.2 g/dL — ABNORMAL LOW (ref 12.0–15.0)
Immature Granulocytes: 0 %
Lymphocytes Relative: 15 %
Lymphs Abs: 1 K/uL (ref 0.7–4.0)
MCH: 37.5 pg — ABNORMAL HIGH (ref 26.0–34.0)
MCHC: 33.1 g/dL (ref 30.0–36.0)
MCV: 113 fL — ABNORMAL HIGH (ref 80.0–100.0)
Monocytes Absolute: 0.7 K/uL (ref 0.1–1.0)
Monocytes Relative: 10 %
Neutro Abs: 4.8 K/uL (ref 1.7–7.7)
Neutrophils Relative %: 72 %
Platelet Count: 253 K/uL (ref 150–400)
RBC: 2.99 MIL/uL — ABNORMAL LOW (ref 3.87–5.11)
RDW: 14.7 % (ref 11.5–15.5)
WBC Count: 6.7 K/uL (ref 4.0–10.5)
nRBC: 0 % (ref 0.0–0.2)

## 2024-02-13 LAB — CMP (CANCER CENTER ONLY)
ALT: 11 U/L (ref 0–44)
AST: 22 U/L (ref 15–41)
Albumin: 3.9 g/dL (ref 3.5–5.0)
Alkaline Phosphatase: 104 U/L (ref 38–126)
Anion gap: 9 (ref 5–15)
BUN: 16 mg/dL (ref 8–23)
CO2: 27 mmol/L (ref 22–32)
Calcium: 9.4 mg/dL (ref 8.9–10.3)
Chloride: 103 mmol/L (ref 98–111)
Creatinine: 0.66 mg/dL (ref 0.44–1.00)
GFR, Estimated: >60 mL/min
Glucose, Bld: 163 mg/dL — ABNORMAL HIGH (ref 70–99)
Potassium: 4.1 mmol/L (ref 3.5–5.1)
Sodium: 139 mmol/L (ref 135–145)
Total Bilirubin: 0.4 mg/dL (ref 0.0–1.2)
Total Protein: 6.8 g/dL (ref 6.5–8.1)

## 2024-02-13 MED ORDER — PALONOSETRON HCL INJECTION 0.25 MG/5ML
0.2500 mg | Freq: Once | INTRAVENOUS | Status: AC
  Administered 2024-02-13: 0.25 mg via INTRAVENOUS
  Filled 2024-02-13: qty 5

## 2024-02-13 MED ORDER — SODIUM CHLORIDE 0.9 % IV SOLN: INTRAVENOUS | Status: DC

## 2024-02-13 MED ORDER — DIPHENHYDRAMINE HCL 50 MG/ML IJ SOLN
25.0000 mg | Freq: Once | INTRAMUSCULAR | Status: AC
  Administered 2024-02-13: 25 mg via INTRAVENOUS
  Filled 2024-02-13: qty 1

## 2024-02-13 MED ORDER — DEXAMETHASONE SOD PHOSPHATE PF 10 MG/ML IJ SOLN
10.0000 mg | Freq: Once | INTRAMUSCULAR | Status: AC
  Administered 2024-02-13: 10 mg via INTRAVENOUS

## 2024-02-13 MED ORDER — FAMOTIDINE IN NACL 20-0.9 MG/50ML-% IV SOLN
20.0000 mg | Freq: Once | INTRAVENOUS | Status: AC
  Administered 2024-02-13: 20 mg via INTRAVENOUS
  Filled 2024-02-13: qty 50

## 2024-02-13 MED ORDER — SODIUM CHLORIDE 0.9 % IV SOLN
318.0000 mg | Freq: Once | INTRAVENOUS | Status: AC
  Administered 2024-02-13: 320 mg via INTRAVENOUS
  Filled 2024-02-13: qty 32

## 2024-02-13 MED ORDER — SODIUM CHLORIDE 0.9 % IV SOLN
150.0000 mg | Freq: Once | INTRAVENOUS | Status: AC
  Administered 2024-02-13: 150 mg via INTRAVENOUS
  Filled 2024-02-13: qty 150

## 2024-02-13 MED ORDER — SODIUM CHLORIDE 0.9 % IV SOLN
175.0000 mg/m2 | Freq: Once | INTRAVENOUS | Status: AC
  Administered 2024-02-13: 264 mg via INTRAVENOUS
  Filled 2024-02-13: qty 44

## 2024-02-13 NOTE — Patient Instructions (Signed)

## 2024-02-13 NOTE — Patient Instructions (Signed)
 [facility] - A DEPT OF [facility]  Discharge Instructions: Thank you for choosing [facility] to provide your oncology and hematology care.   If you have a lab appointment with the Cancer Center, please go directly to the Cancer Center and check in at the registration area.   Wear comfortable clothing and clothing appropriate for easy access to any Portacath or PICC line.   We strive to give you quality time with your provider. You may need to reschedule your appointment if you arrive late (15 or more minutes).  Arriving late affects you and other patients whose appointments are after yours.  Also, if you miss three or more appointments without notifying the office, you may be dismissed from the clinic at the provider's discretion.      For prescription refill requests, have your pharmacy contact our office and allow 72 hours for refills to be completed.    Today you received the following chemotherapy and/or immunotherapy agents: taxol  and carboplatin .      To help prevent nausea and vomiting after your treatment, we encourage you to take your nausea medication as directed.  BELOW ARE SYMPTOMS THAT SHOULD BE REPORTED IMMEDIATELY: *FEVER GREATER THAN 100.4 F (38 C) OR HIGHER *CHILLS OR SWEATING *NAUSEA AND VOMITING THAT IS NOT CONTROLLED WITH YOUR NAUSEA MEDICATION *UNUSUAL SHORTNESS OF BREATH *UNUSUAL BRUISING OR BLEEDING *URINARY PROBLEMS (pain or burning when urinating, or frequent urination) *BOWEL PROBLEMS (unusual diarrhea, constipation, pain near the anus) TENDERNESS IN MOUTH AND THROAT WITH OR WITHOUT PRESENCE OF ULCERS (sore throat, sores in mouth, or a toothache) UNUSUAL RASH, SWELLING OR PAIN  UNUSUAL VAGINAL DISCHARGE OR ITCHING   Items with * indicate a potential emergency and should be followed up as soon as possible or go to the Emergency Department if any problems should occur.  Please show the CHEMOTHERAPY ALERT CARD or  IMMUNOTHERAPY ALERT CARD at check-in to the Emergency Department and triage nurse.  Should you have questions after your visit or need to cancel or reschedule your appointment, please contact [facility] - A DEPT OF [facility]  Dept: [phone]  and follow the prompts.  Office hours are 8:00 a.m. to 4:30 p.m. Monday - Friday. Please note that voicemails left after 4:00 p.m. may not be returned until the following business day.  We are closed weekends and major holidays. You have access to a nurse at all times for urgent questions. Please call the main number to the clinic Dept: [phone] and follow the prompts.   For any non-urgent questions, you may also contact your provider using MyChart. We now offer e-Visits for anyone 56 and older to request care online for non-urgent symptoms. For details visit [email or web address].   Also download the MyChart app! Go to the app store, search MyChart, open the app, select [facility], and log in with your MyChart username and password.

## 2024-02-13 NOTE — Progress Notes (Signed)
 " [facility] OFFICE PROGRESS NOTE   Diagnosis: Peritoneal carcinoma  INTERVAL HISTORY:   Nancy Harrison returns as scheduled.  She completed another cycle of paclitaxel /carboplatin  01/26/2024.  She reports increased loose stool output from the ostomy.  Stable mild neuropathy symptoms.  No other complaint.  Objective:  Vital signs in last 24 hours:  Blood pressure 136/85, pulse 100, temperature 97.7 F (36.5 C), temperature source Temporal, resp. rate 18, height 4' 11 (1.499 m), weight 125 lb 9.6 oz (57 kg), SpO2 98%.    HEENT: No thrush or ulcers Resp: Lungs clear bilaterally Cardio: Regular rate and rhythm GI: No hepatosplenomegaly, right mid abdomen ostomy with semiformed stool Vascular: Trace ankle edema bilaterally  Portacath/PICC-without erythema  Lab Results:  Lab Results  Component Value Date   WBC 6.7 02/13/2024   HGB 11.2 (L) 02/13/2024   HCT 33.8 (L) 02/13/2024   MCV 113.0 (H) 02/13/2024   PLT 253 02/13/2024   NEUTROABS 4.8 02/13/2024    CMP  Lab Results  Component Value Date   NA 139 01/25/2024   K 4.4 01/25/2024   CL 102 01/25/2024   CO2 29 01/25/2024   GLUCOSE 136 (H) 01/25/2024   BUN 16 01/25/2024   CREATININE 0.67 01/25/2024   CALCIUM  10.0 01/25/2024   PROT 7.3 01/25/2024   ALBUMIN  4.1 01/25/2024   AST 24 01/25/2024   ALT 13 01/25/2024   ALKPHOS 82 01/25/2024   BILITOT 0.5 01/25/2024   GFRNONAA >60 01/25/2024    Lab Results  Component Value Date   CEA1 2.5 10/18/2021    Lab Results  Component Value Date   INR 1.0 05/29/2022   LABPROT 13.0 05/29/2022    Imaging:  No results found.  Medications: I have reviewed the patient's current medications.   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  Bone lesions concerning for metastases, right lung nodule Laparoscopy, diverting loop ileostomy, gastrostomy tube placement, peritoneal biopsy 10/16/2021, no evidence of a primary tumor site at the appendix, ovaries, or uterus.  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  CDX2, CK20, and GATA3 negative.  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 Foundation 1-MSS, tumor mutation burden 5, HRD positive-LOH score 34.1% 08/11/2021 CA125 818 CT chest 11/03/2021-10 mm right lower lobe nodule, scattered tiny pulmonary nodules, peritoneal carcinomatosis Cycle 1 Taxol /carboplatin  11/12/2021 Cycle 2 Taxol /carboplatin  12/03/2021 Cycle 3 Taxol /carboplatin  12/24/2021 Bone scan 01/05/2022-negative for metastatic disease Cycle 4 Taxol /carboplatin  01/13/2022 CTs 01/22/2022-reduction in peritoneal carcinomatosis, no evidence of progressive disease stable bilateral lower lobe pulmonary nodules Cycle 5 Taxol /carboplatin  02/03/2022 Cycle 6 Taxol /carboplatin  02/24/2022 CT abdomen/pelvis 04/19/2022-slight decrease in peritoneal carcinomatosis with residual lesions 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 Olaparib  05/14/2022 Bevacizumab  every 3 weeks 05/19/2022 Olaparib  held on hospital admission 05/29/2022 Olaparib  resumed 06/09/2022, dose reduced to 150 mg twice daily CT abdomen/pelvis 09/06/2022-decrease in size and conspicuity of peritoneal carcinomatosis, unchanged left adrenal nodule Olaparib /bevacizumab  continued CT abdomen/pelvis 03/16/2023: No progressive disease, stable ill-defined soft tissue in the gastrocolic ligament, stable right lower lobe nodule Olaparib /bevacizumab  continued CT abdomen/pelvis 08/27/2023:  New right hydroureteronephrosis to the level of the pelvic inlet/13 mm enhancing nodule, soft tissue density  at the gastrocolic ligament measures smaller in a previously identified nodule adjacent to this is not identified, left adrenal nodule is stable, stable right lower lobe nodule Olaparib /bevacizumab  discontinued Cycle 1 Taxol /carboplatin  09/16/2023, Fulphila  Cycle 2 Taxol /carboplatin  10/06/2023, Fulphila  Cycle 3 Taxol /carboplatin  10/27/2023, Fulphila  Cycle 4 Taxol /carboplatin  11/17/2023, Fulphila  Cycle 5 Taxol /carboplatin  12/08/2023, Fulphila  Cycle 6 Taxol /carboplatin  12/28/2023, Fulphila  CTs 01/23/2024-abnormal soft tissue density in the transverse mesocolon, more conspicuous; soft tissue nodularity along the left sigmoid colon adjacent to the adnexa, tumor deposit not excluded; prominent right hydronephrosis and proximal hydroureter Cycle 7 Taxol /carboplatin  01/26/2024, Fulphila  Cycle 8 Taxol /carboplatin  02/13/2024 2.   Abdominal distention/pain and diarrhea secondary to #1 3.   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  Lumpectomy and adjuvant CMF x8 followed by radiation 4. Hypertension 5. Hypothyroidism 6. Barrett's esophagus 7. Family history of multiple cancers including appendix, colon, and lung cancer 8.  Hospital admission 11/05/2021 with dehydration/prerenal azotemia secondary to nausea and high output ileostomy 9.  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 Admission 05/29/2022 with dehydration and acute renal injury      Disposition: Nancy Harrison appears stable.  She will complete another cycle of paclitaxel /carboplatin  today.  We will follow-up on the CA125 from today.  She will return for an office visit and chemotherapy in 3 weeks.  I encouraged her to increase the use of Imodium  and Lomotil  for diarrhea.  Arley Hof, MD  02/13/2024  9:43 AM   "

## 2024-02-14 ENCOUNTER — Other Ambulatory Visit: Payer: Self-pay

## 2024-02-14 DIAGNOSIS — Z932 Ileostomy status: Secondary | ICD-10-CM | POA: Diagnosis not present

## 2024-02-14 LAB — CA 125: Cancer Antigen (CA) 125: 59.7 U/mL — ABNORMAL HIGH (ref 0.0–38.1)

## 2024-02-15 ENCOUNTER — Inpatient Hospital Stay

## 2024-02-15 VITALS — BP 144/71 | HR 106 | Temp 97.9°F | Resp 18

## 2024-02-15 DIAGNOSIS — C481 Malignant neoplasm of specified parts of peritoneum: Secondary | ICD-10-CM

## 2024-02-15 DIAGNOSIS — Z5111 Encounter for antineoplastic chemotherapy: Secondary | ICD-10-CM | POA: Diagnosis not present

## 2024-02-15 MED ORDER — PEGFILGRASTIM-JMDB 6 MG/0.6ML ~~LOC~~ SOSY
6.0000 mg | PREFILLED_SYRINGE | Freq: Once | SUBCUTANEOUS | Status: AC
  Administered 2024-02-15: 6 mg via SUBCUTANEOUS

## 2024-02-15 NOTE — Patient Instructions (Signed)

## 2024-02-29 ENCOUNTER — Other Ambulatory Visit: Payer: Self-pay

## 2024-02-29 ENCOUNTER — Other Ambulatory Visit: Payer: Self-pay | Admitting: Oncology

## 2024-02-29 ENCOUNTER — Other Ambulatory Visit: Payer: Self-pay | Admitting: *Deleted

## 2024-02-29 ENCOUNTER — Other Ambulatory Visit (HOSPITAL_BASED_OUTPATIENT_CLINIC_OR_DEPARTMENT_OTHER): Payer: Self-pay

## 2024-02-29 ENCOUNTER — Encounter: Payer: Self-pay | Admitting: Oncology

## 2024-02-29 MED ORDER — MAGNESIUM OXIDE 400 MG PO TABS
400.0000 mg | ORAL_TABLET | Freq: Two times a day (BID) | ORAL | 0 refills | Status: DC
  Filled 2024-02-29: qty 60, 30d supply, fill #0

## 2024-03-01 ENCOUNTER — Other Ambulatory Visit (HOSPITAL_BASED_OUTPATIENT_CLINIC_OR_DEPARTMENT_OTHER): Payer: Self-pay

## 2024-03-03 ENCOUNTER — Other Ambulatory Visit: Payer: Self-pay | Admitting: Oncology

## 2024-03-07 ENCOUNTER — Inpatient Hospital Stay

## 2024-03-07 ENCOUNTER — Encounter: Payer: Self-pay | Admitting: Nurse Practitioner

## 2024-03-07 ENCOUNTER — Inpatient Hospital Stay: Admitting: Nurse Practitioner

## 2024-03-07 ENCOUNTER — Inpatient Hospital Stay: Attending: Oncology

## 2024-03-07 VITALS — BP 141/81 | HR 109 | Temp 97.8°F | Resp 18 | Ht 59.0 in | Wt 125.7 lb

## 2024-03-07 DIAGNOSIS — C481 Malignant neoplasm of specified parts of peritoneum: Secondary | ICD-10-CM | POA: Insufficient documentation

## 2024-03-07 DIAGNOSIS — C786 Secondary malignant neoplasm of retroperitoneum and peritoneum: Secondary | ICD-10-CM | POA: Insufficient documentation

## 2024-03-07 DIAGNOSIS — Z5189 Encounter for other specified aftercare: Secondary | ICD-10-CM | POA: Insufficient documentation

## 2024-03-07 DIAGNOSIS — Z5111 Encounter for antineoplastic chemotherapy: Secondary | ICD-10-CM | POA: Diagnosis present

## 2024-03-07 LAB — CMP (CANCER CENTER ONLY)
ALT: 13 U/L (ref 0–44)
AST: 23 U/L (ref 15–41)
Albumin: 4.1 g/dL (ref 3.5–5.0)
Alkaline Phosphatase: 129 U/L — ABNORMAL HIGH (ref 38–126)
Anion gap: 9 (ref 5–15)
BUN: 18 mg/dL (ref 8–23)
CO2: 29 mmol/L (ref 22–32)
Calcium: 10 mg/dL (ref 8.9–10.3)
Chloride: 100 mmol/L (ref 98–111)
Creatinine: 0.68 mg/dL (ref 0.44–1.00)
GFR, Estimated: >60 mL/min
Glucose, Bld: 195 mg/dL — ABNORMAL HIGH (ref 70–99)
Potassium: 4 mmol/L (ref 3.5–5.1)
Sodium: 138 mmol/L (ref 135–145)
Total Bilirubin: 0.5 mg/dL (ref 0.0–1.2)
Total Protein: 7.3 g/dL (ref 6.5–8.1)

## 2024-03-07 LAB — CBC WITH DIFFERENTIAL (CANCER CENTER ONLY)
Abs Immature Granulocytes: 0.02 K/uL (ref 0.00–0.07)
Basophils Absolute: 0.1 K/uL (ref 0.0–0.1)
Basophils Relative: 1 %
Eosinophils Absolute: 0 K/uL (ref 0.0–0.5)
Eosinophils Relative: 1 %
HCT: 33.9 % — ABNORMAL LOW (ref 36.0–46.0)
Hemoglobin: 11.3 g/dL — ABNORMAL LOW (ref 12.0–15.0)
Immature Granulocytes: 0 %
Lymphocytes Relative: 15 %
Lymphs Abs: 1.2 K/uL (ref 0.7–4.0)
MCH: 37.8 pg — ABNORMAL HIGH (ref 26.0–34.0)
MCHC: 33.3 g/dL (ref 30.0–36.0)
MCV: 113.4 fL — ABNORMAL HIGH (ref 80.0–100.0)
Monocytes Absolute: 0.8 K/uL (ref 0.1–1.0)
Monocytes Relative: 10 %
Neutro Abs: 5.8 K/uL (ref 1.7–7.7)
Neutrophils Relative %: 73 %
Platelet Count: 270 K/uL (ref 150–400)
RBC: 2.99 MIL/uL — ABNORMAL LOW (ref 3.87–5.11)
RDW: 14.4 % (ref 11.5–15.5)
WBC Count: 7.8 K/uL (ref 4.0–10.5)
nRBC: 0 % (ref 0.0–0.2)

## 2024-03-07 LAB — MAGNESIUM: Magnesium: 2.2 mg/dL (ref 1.7–2.4)

## 2024-03-07 NOTE — Patient Instructions (Signed)

## 2024-03-07 NOTE — Progress Notes (Signed)
 " [facility] OFFICE PROGRESS NOTE   Diagnosis: Peritoneal carcinoma  INTERVAL HISTORY:   Nancy Harrison returns as scheduled.  She completed another cycle of Taxol /carboplatin  02/13/2024.  She notes fatigue for a day or so after treatment.  She denies nausea/vomiting.  No mouth sores.  No diarrhea.  She has consistent tingling in the fingertips.  For the past 2 weeks she has noted she is dropping items.  Objective:  Vital signs in last 24 hours:  Blood pressure (!) 141/81, pulse (!) 109, temperature 97.8 F (36.6 C), temperature source Temporal, resp. rate 18, height 4' 11 (1.499 m), weight 125 lb 11.2 oz (57 kg), SpO2 98%.    HEENT: No thrush or ulcers. Resp: Lungs clear bilaterally. Cardio: Regular rate and rhythm. GI: No hepatosplenomegaly.  Right abdomen ostomy. Vascular: Trace lower leg edema bilaterally. Skin: No rash. Port-A-Cath without erythema.  Lab Results:  Lab Results  Component Value Date   WBC 7.8 03/07/2024   HGB 11.3 (L) 03/07/2024   HCT 33.9 (L) 03/07/2024   MCV 113.4 (H) 03/07/2024   PLT 270 03/07/2024   NEUTROABS 5.8 03/07/2024    Imaging:  No results found.  Medications: I have reviewed the patient's current medications.  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  Bone lesions concerning for metastases, right lung nodule Laparoscopy, diverting loop ileostomy, gastrostomy tube placement, peritoneal biopsy 10/16/2021, no evidence of a primary tumor site at the appendix, ovaries, or uterus.  Diffuse peritoneal carcinomatosis Pathology of the lesser curvature of stomach carcinomatosis biopsy-metastatic poorly differentiated carcinoma, CK7, PAX8,  WT1, and p53 positive with focal labeling for p16.  CDX2, CK20, and GATA3 negative.  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 Foundation 1-MSS, tumor mutation burden 5, HRD positive-LOH score 34.1% 08/11/2021 CA125 818 CT chest 11/03/2021-10 mm right lower lobe nodule, scattered tiny pulmonary nodules, peritoneal carcinomatosis Cycle 1 Taxol /carboplatin  11/12/2021 Cycle 2 Taxol /carboplatin  12/03/2021 Cycle 3 Taxol /carboplatin  12/24/2021 Bone scan 01/05/2022-negative for metastatic disease Cycle 4 Taxol /carboplatin  01/13/2022 CTs 01/22/2022-reduction in peritoneal carcinomatosis, no evidence of progressive disease stable bilateral lower lobe pulmonary nodules Cycle 5 Taxol /carboplatin  02/03/2022 Cycle 6 Taxol /carboplatin  02/24/2022 CT abdomen/pelvis 04/19/2022-slight decrease in peritoneal carcinomatosis with residual lesions 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 Olaparib  05/14/2022 Bevacizumab  every 3 weeks 05/19/2022 Olaparib  held on hospital admission 05/29/2022 Olaparib  resumed 06/09/2022, dose reduced to 150 mg twice daily CT abdomen/pelvis 09/06/2022-decrease in size and conspicuity of peritoneal carcinomatosis, unchanged left adrenal nodule Olaparib /bevacizumab  continued CT abdomen/pelvis 03/16/2023: No progressive disease, stable ill-defined soft tissue in the gastrocolic ligament, stable right lower lobe nodule Olaparib /bevacizumab  continued CT abdomen/pelvis 08/27/2023: New right hydroureteronephrosis to the level of the pelvic inlet/13 mm enhancing nodule, soft tissue density at the gastrocolic ligament measures smaller in a previously identified nodule adjacent to this is not identified, left adrenal nodule is stable, stable right lower lobe nodule Olaparib /bevacizumab  discontinued Cycle 1 Taxol /carboplatin  09/16/2023, Fulphila  Cycle 2 Taxol /carboplatin  10/06/2023, Fulphila  Cycle 3 Taxol /carboplatin  10/27/2023,  Fulphila  Cycle 4 Taxol /carboplatin  11/17/2023, Fulphila  Cycle 5 Taxol /carboplatin  12/08/2023, Fulphila  Cycle 6 Taxol /carboplatin  12/28/2023, Fulphila  CTs 01/23/2024-abnormal soft tissue density in the transverse mesocolon, more conspicuous; soft tissue nodularity along the left sigmoid colon adjacent to the adnexa, tumor deposit not excluded; prominent right hydronephrosis and proximal hydroureter Cycle 7  Taxol /carboplatin  01/26/2024, Fulphila  Cycle 8 Taxol /carboplatin  02/13/2024 Cycle 9 carboplatin  03/07/2024, 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   Abdominal distention/pain and diarrhea secondary to #1 3.   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  Lumpectomy and adjuvant CMF x8 followed by radiation 4. Hypertension 5. Hypothyroidism 6. Barrett's esophagus 7. Family history of multiple cancers including appendix, colon, and lung cancer 8.  Hospital admission 11/05/2021 with dehydration/prerenal azotemia secondary to nausea and high output ileostomy 9.  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 Admission 05/29/2022 with dehydration and acute renal injury    Disposition: Nancy Harrison appears stable.  She has completed 8 cycles of Taxol /carboplatin .  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  We decided to hold Taxol  with tomorrow's treatment, proceed with carboplatin  alone.  She is in agreement.  CBC and chemistry panel reviewed.  Labs adequate for treatment.  She will return for follow-up and the next cycle of chemotherapy in 3 weeks.  We are available to see her sooner if needed.  Patient seen with Dr. Cloretta.  Olam Ned ANP/GNP-BC   03/07/2024  3:24 PM   This was a shared visit with Olam Ned.  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  Taxol  will be placed on hold.  She will complete a cycle of carboplatin  tomorrow.  Arvella Cloretta, MD     "

## 2024-03-08 ENCOUNTER — Inpatient Hospital Stay

## 2024-03-08 VITALS — BP 146/83 | HR 98 | Temp 97.7°F | Resp 18

## 2024-03-08 DIAGNOSIS — Z5111 Encounter for antineoplastic chemotherapy: Secondary | ICD-10-CM | POA: Diagnosis not present

## 2024-03-08 DIAGNOSIS — C481 Malignant neoplasm of specified parts of peritoneum: Secondary | ICD-10-CM

## 2024-03-08 LAB — CA 125: Cancer Antigen (CA) 125: 80.7 U/mL — ABNORMAL HIGH (ref 0.0–38.1)

## 2024-03-08 MED ORDER — SODIUM CHLORIDE 0.9 % IV SOLN: INTRAVENOUS | Status: DC

## 2024-03-08 MED ORDER — SODIUM CHLORIDE 0.9 % IV SOLN
318.0000 mg | Freq: Once | INTRAVENOUS | Status: AC
  Administered 2024-03-08: 320 mg via INTRAVENOUS
  Filled 2024-03-08: qty 32

## 2024-03-08 MED ORDER — SODIUM CHLORIDE 0.9 % IV SOLN
150.0000 mg | Freq: Once | INTRAVENOUS | Status: AC
  Administered 2024-03-08: 150 mg via INTRAVENOUS
  Filled 2024-03-08: qty 150

## 2024-03-08 MED ORDER — PALONOSETRON HCL INJECTION 0.25 MG/5ML
0.2500 mg | Freq: Once | INTRAVENOUS | Status: AC
  Administered 2024-03-08: 0.25 mg via INTRAVENOUS
  Filled 2024-03-08: qty 5

## 2024-03-08 MED ORDER — FAMOTIDINE IN NACL 20-0.9 MG/50ML-% IV SOLN
20.0000 mg | Freq: Once | INTRAVENOUS | Status: AC
  Administered 2024-03-08: 20 mg via INTRAVENOUS
  Filled 2024-03-08: qty 50

## 2024-03-08 MED ORDER — DEXAMETHASONE SOD PHOSPHATE PF 10 MG/ML IJ SOLN
10.0000 mg | Freq: Once | INTRAMUSCULAR | Status: AC
  Administered 2024-03-08: 10 mg via INTRAVENOUS
  Filled 2024-03-08: qty 1

## 2024-03-08 MED ORDER — DIPHENHYDRAMINE HCL 50 MG/ML IJ SOLN
25.0000 mg | Freq: Once | INTRAMUSCULAR | Status: AC
  Administered 2024-03-08: 25 mg via INTRAVENOUS
  Filled 2024-03-08: qty 1

## 2024-03-08 NOTE — Patient Instructions (Signed)
 [facility] - A DEPT OF [facility]  Discharge Instructions: Thank you for choosing [facility] to provide your oncology and hematology care.   If you have a lab appointment with the Cancer Center, please go directly to the Cancer Center and check in at the registration area.   Wear comfortable clothing and clothing appropriate for easy access to any Portacath or PICC line.   We strive to give you quality time with your provider. You may need to reschedule your appointment if you arrive late (15 or more minutes).  Arriving late affects you and other patients whose appointments are after yours.  Also, if you miss three or more appointments without notifying the office, you may be dismissed from the clinic at the provider's discretion.      For prescription refill requests, have your pharmacy contact our office and allow 72 hours for refills to be completed.    Today you received the following chemotherapy and/or immunotherapy agents: carboplatin .      To help prevent nausea and vomiting after your treatment, we encourage you to take your nausea medication as directed.  BELOW ARE SYMPTOMS THAT SHOULD BE REPORTED IMMEDIATELY: *FEVER GREATER THAN 100.4 F (38 C) OR HIGHER *CHILLS OR SWEATING *NAUSEA AND VOMITING THAT IS NOT CONTROLLED WITH YOUR NAUSEA MEDICATION *UNUSUAL SHORTNESS OF BREATH *UNUSUAL BRUISING OR BLEEDING *URINARY PROBLEMS (pain or burning when urinating, or frequent urination) *BOWEL PROBLEMS (unusual diarrhea, constipation, pain near the anus) TENDERNESS IN MOUTH AND THROAT WITH OR WITHOUT PRESENCE OF ULCERS (sore throat, sores in mouth, or a toothache) UNUSUAL RASH, SWELLING OR PAIN  UNUSUAL VAGINAL DISCHARGE OR ITCHING   Items with * indicate a potential emergency and should be followed up as soon as possible or go to the Emergency Department if any problems should occur.  Please show the CHEMOTHERAPY ALERT CARD or  IMMUNOTHERAPY ALERT CARD at check-in to the Emergency Department and triage nurse.  Should you have questions after your visit or need to cancel or reschedule your appointment, please contact [facility] - A DEPT OF [facility]  Dept: [phone]  and follow the prompts.  Office hours are 8:00 a.m. to 4:30 p.m. Monday - Friday. Please note that voicemails left after 4:00 p.m. may not be returned until the following business day.  We are closed weekends and major holidays. You have access to a nurse at all times for urgent questions. Please call the main number to the clinic Dept: [phone] and follow the prompts.   For any non-urgent questions, you may also contact your provider using MyChart. We now offer e-Visits for anyone 43 and older to request care online for non-urgent symptoms. For details visit [email or web address].   Also download the MyChart app! Go to the app store, search MyChart, open the app, select [facility], and log in with your MyChart username and password.

## 2024-03-09 ENCOUNTER — Encounter: Payer: Self-pay | Admitting: Oncology

## 2024-03-09 ENCOUNTER — Inpatient Hospital Stay

## 2024-03-10 ENCOUNTER — Other Ambulatory Visit: Payer: Self-pay

## 2024-03-10 ENCOUNTER — Inpatient Hospital Stay

## 2024-03-10 VITALS — BP 141/92 | HR 95 | Temp 97.2°F | Resp 18

## 2024-03-10 DIAGNOSIS — C481 Malignant neoplasm of specified parts of peritoneum: Secondary | ICD-10-CM

## 2024-03-10 DIAGNOSIS — Z5111 Encounter for antineoplastic chemotherapy: Secondary | ICD-10-CM | POA: Diagnosis not present

## 2024-03-10 MED ORDER — PEGFILGRASTIM INJECTION 6 MG/0.6ML ~~LOC~~
6.0000 mg | PREFILLED_SYRINGE | Freq: Once | SUBCUTANEOUS | Status: AC
  Administered 2024-03-10: 6 mg via SUBCUTANEOUS
  Filled 2024-03-10: qty 0.6

## 2024-03-12 ENCOUNTER — Other Ambulatory Visit (HOSPITAL_BASED_OUTPATIENT_CLINIC_OR_DEPARTMENT_OTHER): Payer: Self-pay

## 2024-03-12 MED ORDER — TAMSULOSIN HCL 0.4 MG PO CAPS
0.4000 mg | ORAL_CAPSULE | Freq: Every evening | ORAL | 11 refills | Status: AC
  Filled 2024-03-12: qty 30, 30d supply, fill #0

## 2024-03-28 ENCOUNTER — Inpatient Hospital Stay: Admitting: Oncology

## 2024-03-28 ENCOUNTER — Other Ambulatory Visit: Payer: Self-pay | Admitting: Oncology

## 2024-03-28 ENCOUNTER — Inpatient Hospital Stay

## 2024-03-28 ENCOUNTER — Telehealth: Payer: Self-pay

## 2024-03-28 ENCOUNTER — Inpatient Hospital Stay: Attending: Oncology

## 2024-03-28 VITALS — BP 159/72 | HR 97 | Temp 98.3°F | Resp 16

## 2024-03-28 VITALS — BP 158/86 | HR 100 | Temp 97.8°F | Resp 18 | Ht 59.0 in | Wt 126.5 lb

## 2024-03-28 DIAGNOSIS — C481 Malignant neoplasm of specified parts of peritoneum: Secondary | ICD-10-CM

## 2024-03-28 LAB — CBC WITH DIFFERENTIAL (CANCER CENTER ONLY)
Abs Immature Granulocytes: 0.03 10*3/uL (ref 0.00–0.07)
Basophils Absolute: 0 10*3/uL (ref 0.0–0.1)
Basophils Relative: 1 %
Eosinophils Absolute: 0.1 10*3/uL (ref 0.0–0.5)
Eosinophils Relative: 2 %
HCT: 33.4 % — ABNORMAL LOW (ref 36.0–46.0)
Hemoglobin: 10.9 g/dL — ABNORMAL LOW (ref 12.0–15.0)
Immature Granulocytes: 1 %
Lymphocytes Relative: 14 %
Lymphs Abs: 0.8 10*3/uL (ref 0.7–4.0)
MCH: 37.2 pg — ABNORMAL HIGH (ref 26.0–34.0)
MCHC: 32.6 g/dL (ref 30.0–36.0)
MCV: 114 fL — ABNORMAL HIGH (ref 80.0–100.0)
Monocytes Absolute: 0.6 10*3/uL (ref 0.1–1.0)
Monocytes Relative: 10 %
Neutro Abs: 4.4 10*3/uL (ref 1.7–7.7)
Neutrophils Relative %: 72 %
Platelet Count: 241 10*3/uL (ref 150–400)
RBC: 2.93 MIL/uL — ABNORMAL LOW (ref 3.87–5.11)
RDW: 14.2 % (ref 11.5–15.5)
WBC Count: 6 10*3/uL (ref 4.0–10.5)
nRBC: 0 % (ref 0.0–0.2)

## 2024-03-28 LAB — CMP (CANCER CENTER ONLY)
ALT: 11 U/L (ref 0–44)
AST: 22 U/L (ref 15–41)
Albumin: 4 g/dL (ref 3.5–5.0)
Alkaline Phosphatase: 137 U/L — ABNORMAL HIGH (ref 38–126)
Anion gap: 10 (ref 5–15)
BUN: 15 mg/dL (ref 8–23)
CO2: 30 mmol/L (ref 22–32)
Calcium: 9.8 mg/dL (ref 8.9–10.3)
Chloride: 101 mmol/L (ref 98–111)
Creatinine: 0.81 mg/dL (ref 0.44–1.00)
GFR, Estimated: >60 mL/min
Glucose, Bld: 150 mg/dL — ABNORMAL HIGH (ref 70–99)
Potassium: 4 mmol/L (ref 3.5–5.1)
Sodium: 140 mmol/L (ref 135–145)
Total Bilirubin: 0.5 mg/dL (ref 0.0–1.2)
Total Protein: 7.1 g/dL (ref 6.5–8.1)

## 2024-03-28 MED ORDER — FAMOTIDINE IN NACL 20-0.9 MG/50ML-% IV SOLN
20.0000 mg | Freq: Once | INTRAVENOUS | Status: AC
  Administered 2024-03-28: 20 mg via INTRAVENOUS
  Filled 2024-03-28: qty 50

## 2024-03-28 MED ORDER — PALONOSETRON HCL INJECTION 0.25 MG/5ML
0.2500 mg | Freq: Once | INTRAVENOUS | Status: AC
  Administered 2024-03-28: 0.25 mg via INTRAVENOUS
  Filled 2024-03-28: qty 5

## 2024-03-28 MED ORDER — DEXAMETHASONE SOD PHOSPHATE PF 10 MG/ML IJ SOLN
10.0000 mg | Freq: Once | INTRAMUSCULAR | Status: AC
  Administered 2024-03-28: 10 mg via INTRAVENOUS
  Filled 2024-03-28: qty 1

## 2024-03-28 MED ORDER — SODIUM CHLORIDE 0.9 % IV SOLN: INTRAVENOUS | Status: DC

## 2024-03-28 MED ORDER — SODIUM CHLORIDE 0.9 % IV SOLN
150.0000 mg | Freq: Once | INTRAVENOUS | Status: AC
  Administered 2024-03-28: 150 mg via INTRAVENOUS
  Filled 2024-03-28: qty 150

## 2024-03-28 MED ORDER — DIPHENHYDRAMINE HCL 50 MG/ML IJ SOLN
25.0000 mg | Freq: Once | INTRAMUSCULAR | Status: AC
  Administered 2024-03-28: 25 mg via INTRAVENOUS
  Filled 2024-03-28: qty 1

## 2024-03-28 MED ORDER — SODIUM CHLORIDE 0.9 % IV SOLN
318.0000 mg | Freq: Once | INTRAVENOUS | Status: AC
  Administered 2024-03-28: 320 mg via INTRAVENOUS
  Filled 2024-03-28: qty 32

## 2024-03-28 NOTE — Progress Notes (Signed)
 Patient seen by Dr. Arley Hof today  Vitals are within treatment parameters:Yes OK to proceed w/B158/86-no intervention needed  Labs are within treatment parameters: Yes   Treatment plan has been signed: Yes   Per physician team, Patient is ready for treatment and there are NO modifications to the treatment plan. Continue to hold Taxol 

## 2024-03-28 NOTE — Patient Instructions (Signed)
 [facility] - A DEPT OF [facility]  Discharge Instructions: Thank you for choosing [facility] to provide your oncology and hematology care.   If you have a lab appointment with the Cancer Center, please go directly to the Cancer Center and check in at the registration area.   Wear comfortable clothing and clothing appropriate for easy access to any Portacath or PICC line.   We strive to give you quality time with your provider. You may need to reschedule your appointment if you arrive late (15 or more minutes).  Arriving late affects you and other patients whose appointments are after yours.  Also, if you miss three or more appointments without notifying the office, you may be dismissed from the clinic at the provider's discretion.      For prescription refill requests, have your pharmacy contact our office and allow 72 hours for refills to be completed.    Today you received the following chemotherapy and/or immunotherapy agents: carboplatin .      To help prevent nausea and vomiting after your treatment, we encourage you to take your nausea medication as directed.  BELOW ARE SYMPTOMS THAT SHOULD BE REPORTED IMMEDIATELY: *FEVER GREATER THAN 100.4 F (38 C) OR HIGHER *CHILLS OR SWEATING *NAUSEA AND VOMITING THAT IS NOT CONTROLLED WITH YOUR NAUSEA MEDICATION *UNUSUAL SHORTNESS OF BREATH *UNUSUAL BRUISING OR BLEEDING *URINARY PROBLEMS (pain or burning when urinating, or frequent urination) *BOWEL PROBLEMS (unusual diarrhea, constipation, pain near the anus) TENDERNESS IN MOUTH AND THROAT WITH OR WITHOUT PRESENCE OF ULCERS (sore throat, sores in mouth, or a toothache) UNUSUAL RASH, SWELLING OR PAIN  UNUSUAL VAGINAL DISCHARGE OR ITCHING   Items with * indicate a potential emergency and should be followed up as soon as possible or go to the Emergency Department if any problems should occur.  Please show the CHEMOTHERAPY ALERT CARD or  IMMUNOTHERAPY ALERT CARD at check-in to the Emergency Department and triage nurse.  Should you have questions after your visit or need to cancel or reschedule your appointment, please contact [facility] - A DEPT OF [facility]  Dept: [phone]  and follow the prompts.  Office hours are 8:00 a.m. to 4:30 p.m. Monday - Friday. Please note that voicemails left after 4:00 p.m. may not be returned until the following business day.  We are closed weekends and major holidays. You have access to a nurse at all times for urgent questions. Please call the main number to the clinic Dept: [phone] and follow the prompts.   For any non-urgent questions, you may also contact your provider using MyChart. We now offer e-Visits for anyone 43 and older to request care online for non-urgent symptoms. For details visit [email or web address].   Also download the MyChart app! Go to the app store, search MyChart, open the app, select [facility], and log in with your MyChart username and password.

## 2024-03-28 NOTE — Patient Instructions (Signed)
 Device With a Small Disc Placed for Long-Term IV Use (Implanted Port): Care at Home An implanted port is a device with a small disc that's put under your skin. In most cases, it's placed in your chest. It can be used to draw blood and send medicines and fluids quickly into your bloodstream. You may need a port to give you: IV medicines that would bother the small veins in your hands or arms. IV medicines for a long time, such as medicines to kill cancer cells (chemotherapy). IV liquid nutrition for a long time. An implanted port has 2 main parts: The portal. This is the round disc where the needle is put in. It may look like a small, raised area under your skin. The catheter. This is a soft tube that connects the port to a vein. You may be able to do more everyday tasks with a port than with other types of long-term IVs. How is my port accessed?  A numbing cream may be put on the skin over the port site. Your skin will be cleaned with a solution that kills germs. Your health care provider will gently pinch the port and put a needle in it. The port will be checked to make sure it's in the vein and working. If you need to get medicine all the time, the needle may be left in the port. Your provider will put a clear bandage over the needle site. The bandage and needle will need to be changed each week. What is flushing? Flushing helps keep the port working like it should. Flush your port as told. If your port is only used from time to time to give medicines or draw blood, you may need to flush it: Before and after medicines have been given. Before and after blood has been drawn. Every 4-6 weeks to keep it working well. If you get medicine through your port all the time, you may not need to flush it. Talk with your provider to learn more. How long will my port stay in? The port will stay in for as long as it's needed. When it's time for it to come out, you'll have a procedure to remove it. Follow  these instructions at home: Caring for your port and port site Flush your port as told. If you need to get an infusion of medicine over a few days, take care of your port as told. Make sure you: Take care of your port site. Wash your hands with soap and water  for at least 20 seconds before and after you change your bandage. If you can't use soap and water , use hand sanitizer. Put any used bandages or infusion bags in a plastic bag. Throw the bag in the trash. Keep the bandage that covers the needle clean and dry. Do not let it get wet. Do not use scissors or sharp objects near the tubes. Keep any tubes clamped, unless they're being used. Check the area around your port site every day for signs of infection. Check for: Redness, swelling, or pain. Fluid or blood. Warmth. Pus or a bad smell. Protect the skin around the port site. Avoid wearing bra straps that rub or irritate the site. Protect the skin around your port from seat belts. Place a soft pad over your chest if needed. Do not take baths, swim, or use a hot tub until you're told it's OK. Ask if you can shower. General instructions  Throw away any syringes in a container that's meant for sharp  items (sharps container). You can buy a sharps container from a pharmacy. You can also make one by using an empty, hard plastic bottle with a lid. Always carry a medical alert card or wear a medical alert bracelet. Ask what things are safe for you to do at home. Ask when you can go back to work or school. Where to find more information To learn more, go to: American Cancer Society at prombar.it. Click on the magnifying glass and type IV lines and ports. Find the link you need. American Society of Clinical Oncology at fabvets.de. Click on the magnifying glass and type getting chemo infusions. Find the link you need. Contact a health care provider if: You can't flush your port. You can't draw blood from the port. You have a fever or  chills. You have any signs of infection. You have swelling in your arm, neck, or shoulder. This information is not intended to replace advice given to you by your health care provider. Make sure you discuss any questions you have with your health care provider. Document Revised: 11/22/2023 Document Reviewed: 11/22/2023 Elsevier Patient Education  2025 [employer].

## 2024-03-28 NOTE — Progress Notes (Signed)
 " [facility] OFFICE PROGRESS NOTE   Diagnosis: Peritoneal carcinoma  INTERVAL HISTORY:   Nancy Harrison completed another cycle of carboplatin  on 03/08/2024.  No symptom of allergic reaction.  She reports improvement in tingling of the fingers.  No new complaint.  She empties the ileostomy a few times per day.  Objective:  Vital signs in last 24 hours:  Blood pressure (!) 158/86, pulse 100, temperature 97.8 F (36.6 C), temperature source Temporal, resp. rate 18, height 4' 11 (1.499 m), weight 126 lb 8 oz (57.4 kg), SpO2 98%.    HEENT: No thrush or ulcers Resp: Lungs clear bilaterally Cardio: Regular rate and rhythm GI: No hepatosplenomegaly, mild tenderness in the left mid abdomen, no mass, no apparent ascites, right abdomen ileostomy with semiformed stool Vascular: Trace edema at the lower leg/ankle on the left greater than right   Portacath/PICC-without erythema  Lab Results:  Lab Results  Component Value Date   WBC 6.0 03/28/2024   HGB 10.9 (L) 03/28/2024   HCT 33.4 (L) 03/28/2024   MCV 114.0 (H) 03/28/2024   PLT 241 03/28/2024   NEUTROABS 4.4 03/28/2024    CMP  Lab Results  Component Value Date   NA 138 03/07/2024   K 4.0 03/07/2024   CL 100 03/07/2024   CO2 29 03/07/2024   GLUCOSE 195 (H) 03/07/2024   BUN 18 03/07/2024   CREATININE 0.68 03/07/2024   CALCIUM  10.0 03/07/2024   PROT 7.3 03/07/2024   ALBUMIN  4.1 03/07/2024   AST 23 03/07/2024   ALT 13 03/07/2024   ALKPHOS 129 (H) 03/07/2024   BILITOT 0.5 03/07/2024   GFRNONAA >60 03/07/2024    Lab Results  Component Value Date   CEA1 2.5 10/18/2021   Medications: I have reviewed the patient's current medications.   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  Bone lesions concerning for metastases, right lung nodule Laparoscopy, diverting loop ileostomy, gastrostomy tube placement, peritoneal biopsy 10/16/2021, no evidence of a primary tumor site at the appendix, ovaries, or uterus.  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  CDX2, CK20, and GATA3 negative.  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 Foundation 1-MSS, tumor mutation burden 5, HRD positive-LOH score 34.1% 08/11/2021 CA125 818 CT chest 11/03/2021-10 mm right lower lobe nodule, scattered tiny pulmonary nodules, peritoneal carcinomatosis Cycle 1 Taxol /carboplatin  11/12/2021 Cycle 2 Taxol /carboplatin  12/03/2021 Cycle 3 Taxol /carboplatin  12/24/2021 Bone scan 01/05/2022-negative for metastatic disease Cycle 4 Taxol /carboplatin  01/13/2022 CTs 01/22/2022-reduction in peritoneal carcinomatosis, no evidence of progressive disease stable bilateral lower lobe pulmonary nodules Cycle 5 Taxol /carboplatin  02/03/2022 Cycle 6 Taxol /carboplatin  02/24/2022 CT abdomen/pelvis 04/19/2022-slight decrease in peritoneal carcinomatosis with residual lesions 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 Olaparib  05/14/2022 Bevacizumab  every 3 weeks 05/19/2022 Olaparib  held on hospital admission 05/29/2022 Olaparib  resumed 06/09/2022, dose reduced to 150 mg twice daily CT abdomen/pelvis 09/06/2022-decrease in size and conspicuity of peritoneal carcinomatosis, unchanged left adrenal nodule Olaparib /bevacizumab  continued CT abdomen/pelvis 03/16/2023: No progressive disease, stable ill-defined soft tissue in the gastrocolic ligament, stable right lower lobe nodule Olaparib /bevacizumab  continued CT abdomen/pelvis 08/27/2023:  New right hydroureteronephrosis to the level of the pelvic inlet/13 mm enhancing nodule,  soft tissue density at the gastrocolic ligament measures smaller in a previously identified nodule adjacent to this is not identified, left adrenal nodule is stable, stable right lower lobe nodule Olaparib /bevacizumab  discontinued Cycle 1 Taxol /carboplatin  09/16/2023, Fulphila  Cycle 2 Taxol /carboplatin  10/06/2023, Fulphila  Cycle 3 Taxol /carboplatin  10/27/2023, Fulphila  Cycle 4 Taxol /carboplatin  11/17/2023, Fulphila  Cycle 5 Taxol /carboplatin  12/08/2023, Fulphila  Cycle 6 Taxol /carboplatin  12/28/2023, Fulphila  CTs 01/23/2024-abnormal soft tissue density in the transverse mesocolon, more conspicuous; soft tissue nodularity along the left sigmoid colon adjacent to the adnexa, tumor deposit not excluded; prominent right hydronephrosis and proximal hydroureter Cycle 7 Taxol /carboplatin  01/26/2024, Fulphila  Cycle 8 Taxol /carboplatin  02/13/2024 Cycle 9 carboplatin  03/07/2024, 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   Abdominal distention/pain and diarrhea secondary to #1 3.   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  Lumpectomy and adjuvant CMF x8 followed by radiation 4. Hypertension 5. Hypothyroidism 6. Barrett's esophagus 7. Family history of multiple cancers including appendix, colon, and lung cancer 8.  Hospital admission 11/05/2021 with dehydration/prerenal azotemia secondary to nausea and high output ileostomy 9.  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 Admission 05/29/2022 with dehydration and acute renal injury    Disposition: Nancy Harrison appears unchanged.  She has persistent symptoms of Taxol  neuropathy.  Taxol  will remain on hold.  She will complete another of carboplatin  today.  She will return for an office visit and chemotherapy in 3 weeks.  We will follow-up on the  CA125 from today. She will be scheduled for restaging CTs within the next 2 months.  Arley Hof, MD  03/28/2024  10:19 AM   "

## 2024-03-28 NOTE — Telephone Encounter (Signed)
 Unable to reach patient, left voicemail about new Injection time on Friday 03/30/24.

## 2024-03-29 ENCOUNTER — Other Ambulatory Visit: Payer: Self-pay | Admitting: *Deleted

## 2024-03-29 ENCOUNTER — Encounter: Payer: Self-pay | Admitting: Oncology

## 2024-03-29 ENCOUNTER — Other Ambulatory Visit (HOSPITAL_BASED_OUTPATIENT_CLINIC_OR_DEPARTMENT_OTHER): Payer: Self-pay

## 2024-03-29 LAB — CA 125: Cancer Antigen (CA) 125: 97.6 U/mL — ABNORMAL HIGH (ref 0.0–38.1)

## 2024-03-29 MED ORDER — MAGNESIUM OXIDE 400 MG PO TABS
400.0000 mg | ORAL_TABLET | Freq: Two times a day (BID) | ORAL | 0 refills | Status: AC
  Filled 2024-03-29: qty 60, 30d supply, fill #0

## 2024-03-30 ENCOUNTER — Inpatient Hospital Stay

## 2024-03-30 VITALS — BP 158/82 | HR 100 | Temp 98.1°F | Resp 18

## 2024-03-30 DIAGNOSIS — C481 Malignant neoplasm of specified parts of peritoneum: Secondary | ICD-10-CM

## 2024-03-30 MED ORDER — PEGFILGRASTIM INJECTION 6 MG/0.6ML ~~LOC~~
6.0000 mg | PREFILLED_SYRINGE | Freq: Once | SUBCUTANEOUS | Status: AC
  Administered 2024-03-30: 6 mg via SUBCUTANEOUS
  Filled 2024-03-30: qty 0.6

## 2024-03-30 NOTE — Patient Instructions (Signed)

## 2024-04-18 ENCOUNTER — Inpatient Hospital Stay: Admitting: Nurse Practitioner

## 2024-04-18 ENCOUNTER — Inpatient Hospital Stay

## 2024-04-19 ENCOUNTER — Inpatient Hospital Stay

## 2024-04-19 ENCOUNTER — Inpatient Hospital Stay: Admitting: Nurse Practitioner

## 2024-04-20 ENCOUNTER — Inpatient Hospital Stay

## 2024-04-21 ENCOUNTER — Inpatient Hospital Stay
